# Patient Record
Sex: Male | Born: 1935 | Race: White | Hispanic: No | Smoking: Former smoker
Health system: Southern US, Community
[De-identification: ages and names within clinical notes are randomized; demographics above are authoritative.]

## PROBLEM LIST (undated history)

## (undated) DIAGNOSIS — Z87442 Personal history of urinary calculi: Secondary | ICD-10-CM

## (undated) DIAGNOSIS — E538 Deficiency of other specified B group vitamins: Secondary | ICD-10-CM

## (undated) DIAGNOSIS — K219 Gastro-esophageal reflux disease without esophagitis: Secondary | ICD-10-CM

## (undated) DIAGNOSIS — M1990 Unspecified osteoarthritis, unspecified site: Secondary | ICD-10-CM

## (undated) DIAGNOSIS — K635 Polyp of colon: Secondary | ICD-10-CM

## (undated) DIAGNOSIS — I10 Essential (primary) hypertension: Secondary | ICD-10-CM

## (undated) DIAGNOSIS — F0390 Unspecified dementia without behavioral disturbance: Secondary | ICD-10-CM

## (undated) DIAGNOSIS — N400 Enlarged prostate without lower urinary tract symptoms: Secondary | ICD-10-CM

## (undated) DIAGNOSIS — E785 Hyperlipidemia, unspecified: Secondary | ICD-10-CM

## (undated) DIAGNOSIS — N390 Urinary tract infection, site not specified: Secondary | ICD-10-CM

## (undated) HISTORY — DX: Essential (primary) hypertension: I10

## (undated) HISTORY — PX: APPENDECTOMY: SHX54

## (undated) HISTORY — DX: Deficiency of other specified B group vitamins: E53.8

## (undated) HISTORY — DX: Benign prostatic hyperplasia without lower urinary tract symptoms: N40.0

## (undated) HISTORY — DX: Hyperlipidemia, unspecified: E78.5

## (undated) HISTORY — DX: Unspecified osteoarthritis, unspecified site: M19.90

## (undated) HISTORY — PX: KNEE CARTILAGE SURGERY: SHX688

## (undated) HISTORY — DX: Polyp of colon: K63.5

## (undated) HISTORY — PX: OTHER SURGICAL HISTORY: SHX169

## (undated) HISTORY — DX: Personal history of urinary calculi: Z87.442

## (undated) HISTORY — DX: Gastro-esophageal reflux disease without esophagitis: K21.9

---

## 1997-03-23 ENCOUNTER — Encounter: Payer: Self-pay | Admitting: Gastroenterology

## 1997-03-24 ENCOUNTER — Encounter: Payer: Self-pay | Admitting: Gastroenterology

## 2002-06-29 ENCOUNTER — Encounter: Admission: RE | Admit: 2002-06-29 | Discharge: 2002-06-29 | Payer: Self-pay | Admitting: Family Medicine

## 2002-06-29 ENCOUNTER — Encounter: Payer: Self-pay | Admitting: Family Medicine

## 2002-09-15 ENCOUNTER — Encounter: Payer: Self-pay | Admitting: Gastroenterology

## 2002-09-30 ENCOUNTER — Encounter: Payer: Self-pay | Admitting: *Deleted

## 2002-09-30 ENCOUNTER — Emergency Department (HOSPITAL_COMMUNITY): Admission: EM | Admit: 2002-09-30 | Discharge: 2002-09-30 | Payer: Self-pay | Admitting: Emergency Medicine

## 2002-10-02 ENCOUNTER — Emergency Department (HOSPITAL_COMMUNITY): Admission: EM | Admit: 2002-10-02 | Discharge: 2002-10-02 | Payer: Self-pay | Admitting: Emergency Medicine

## 2002-10-02 ENCOUNTER — Ambulatory Visit (HOSPITAL_COMMUNITY): Admission: EM | Admit: 2002-10-02 | Discharge: 2002-10-02 | Payer: Self-pay | Admitting: Emergency Medicine

## 2002-10-02 ENCOUNTER — Encounter: Payer: Self-pay | Admitting: Urology

## 2002-10-06 ENCOUNTER — Encounter: Payer: Self-pay | Admitting: Urology

## 2002-10-06 ENCOUNTER — Ambulatory Visit (HOSPITAL_BASED_OUTPATIENT_CLINIC_OR_DEPARTMENT_OTHER): Admission: RE | Admit: 2002-10-06 | Discharge: 2002-10-06 | Payer: Self-pay | Admitting: Urology

## 2005-12-11 ENCOUNTER — Ambulatory Visit: Payer: Self-pay | Admitting: Gastroenterology

## 2005-12-17 ENCOUNTER — Encounter (INDEPENDENT_AMBULATORY_CARE_PROVIDER_SITE_OTHER): Payer: Self-pay | Admitting: *Deleted

## 2005-12-17 ENCOUNTER — Ambulatory Visit: Payer: Self-pay | Admitting: Gastroenterology

## 2006-02-17 ENCOUNTER — Ambulatory Visit: Payer: Self-pay | Admitting: Gastroenterology

## 2006-05-08 ENCOUNTER — Inpatient Hospital Stay (HOSPITAL_COMMUNITY): Admission: EM | Admit: 2006-05-08 | Discharge: 2006-05-14 | Payer: Self-pay | Admitting: Emergency Medicine

## 2006-05-18 ENCOUNTER — Ambulatory Visit: Payer: Self-pay | Admitting: Internal Medicine

## 2008-11-20 ENCOUNTER — Ambulatory Visit: Admission: RE | Admit: 2008-11-20 | Discharge: 2008-11-20 | Payer: Self-pay | Admitting: Orthopedic Surgery

## 2008-11-23 ENCOUNTER — Emergency Department (HOSPITAL_COMMUNITY): Admission: EM | Admit: 2008-11-23 | Discharge: 2008-11-23 | Payer: Self-pay | Admitting: Emergency Medicine

## 2009-01-30 ENCOUNTER — Inpatient Hospital Stay (HOSPITAL_COMMUNITY): Admission: RE | Admit: 2009-01-30 | Discharge: 2009-02-03 | Payer: Self-pay | Admitting: Orthopedic Surgery

## 2009-03-01 ENCOUNTER — Encounter: Payer: Self-pay | Admitting: Gastroenterology

## 2009-03-26 ENCOUNTER — Ambulatory Visit: Payer: Self-pay | Admitting: Gastroenterology

## 2009-03-26 DIAGNOSIS — R195 Other fecal abnormalities: Secondary | ICD-10-CM

## 2009-03-26 DIAGNOSIS — D649 Anemia, unspecified: Secondary | ICD-10-CM | POA: Insufficient documentation

## 2009-03-26 DIAGNOSIS — Z860100 Personal history of colon polyps, unspecified: Secondary | ICD-10-CM | POA: Insufficient documentation

## 2009-03-28 ENCOUNTER — Encounter: Payer: Self-pay | Admitting: Gastroenterology

## 2009-03-28 ENCOUNTER — Ambulatory Visit: Payer: Self-pay | Admitting: Gastroenterology

## 2009-03-28 LAB — HM COLONOSCOPY

## 2009-03-30 ENCOUNTER — Encounter: Payer: Self-pay | Admitting: Gastroenterology

## 2010-10-21 ENCOUNTER — Encounter: Payer: Self-pay | Admitting: Family Medicine

## 2010-10-21 DIAGNOSIS — N400 Enlarged prostate without lower urinary tract symptoms: Secondary | ICD-10-CM | POA: Insufficient documentation

## 2010-10-21 DIAGNOSIS — J310 Chronic rhinitis: Secondary | ICD-10-CM | POA: Insufficient documentation

## 2010-10-21 DIAGNOSIS — E119 Type 2 diabetes mellitus without complications: Secondary | ICD-10-CM | POA: Insufficient documentation

## 2010-10-21 DIAGNOSIS — Z87442 Personal history of urinary calculi: Secondary | ICD-10-CM | POA: Insufficient documentation

## 2010-11-01 LAB — GLUCOSE, CAPILLARY: Glucose-Capillary: 149 mg/dL — ABNORMAL HIGH (ref 70–99)

## 2010-11-03 LAB — BASIC METABOLIC PANEL
BUN: 14 mg/dL (ref 6–23)
CO2: 29 mEq/L (ref 19–32)
CO2: 30 mEq/L (ref 19–32)
Calcium: 8.2 mg/dL — ABNORMAL LOW (ref 8.4–10.5)
Chloride: 101 mEq/L (ref 96–112)
Creatinine, Ser: 0.85 mg/dL (ref 0.4–1.5)
GFR calc Af Amer: >60 mL/min (ref >60)
GFR calc non Af Amer: >60 mL/min (ref >60)
Sodium: 135 mEq/L (ref 135–145)

## 2010-11-03 LAB — GLUCOSE, CAPILLARY
Glucose-Capillary: 129 mg/dL — ABNORMAL HIGH (ref 70–99)
Glucose-Capillary: 141 mg/dL — ABNORMAL HIGH (ref 70–99)
Glucose-Capillary: 145 mg/dL — ABNORMAL HIGH (ref 70–99)
Glucose-Capillary: 167 mg/dL — ABNORMAL HIGH (ref 70–99)
Glucose-Capillary: 180 mg/dL — ABNORMAL HIGH (ref 70–99)
Glucose-Capillary: 184 mg/dL — ABNORMAL HIGH (ref 70–99)
Glucose-Capillary: 184 mg/dL — ABNORMAL HIGH (ref 70–99)
Glucose-Capillary: 189 mg/dL — ABNORMAL HIGH (ref 70–99)
Glucose-Capillary: 228 mg/dL — ABNORMAL HIGH (ref 70–99)
Glucose-Capillary: 277 mg/dL — ABNORMAL HIGH (ref 70–99)

## 2010-11-03 LAB — CBC
Hemoglobin: 8.6 g/dL — ABNORMAL LOW (ref 13.0–17.0)
MCHC: 33.5 g/dL (ref 30.0–36.0)
MCHC: 34.1 g/dL (ref 30.0–36.0)
MCV: 98.5 fL (ref 78.0–100.0)
Platelets: 124 10*3/uL — ABNORMAL LOW (ref 150–400)
RBC: 2.56 MIL/uL — ABNORMAL LOW (ref 4.22–5.81)
RBC: 2.86 MIL/uL — ABNORMAL LOW (ref 4.22–5.81)
WBC: 8.4 10*3/uL (ref 4.0–10.5)

## 2010-11-03 LAB — TYPE AND SCREEN
ABO/RH(D): O POS
Antibody Screen: NEGATIVE

## 2010-11-04 LAB — URINALYSIS, ROUTINE W REFLEX MICROSCOPIC
Bilirubin Urine: NEGATIVE
Glucose, UA: NEGATIVE mg/dL
Ketones, ur: NEGATIVE mg/dL
Protein, ur: NEGATIVE mg/dL
pH: 5 (ref 5.0–8.0)

## 2010-11-04 LAB — BASIC METABOLIC PANEL
BUN: 28 mg/dL — ABNORMAL HIGH (ref 6–23)
CO2: 28 mEq/L (ref 19–32)
Calcium: 9.4 mg/dL (ref 8.4–10.5)
GFR calc non Af Amer: >60 mL/min (ref >60)
Glucose, Bld: 169 mg/dL — ABNORMAL HIGH (ref 70–99)

## 2010-11-04 LAB — DIFFERENTIAL
Basophils Absolute: 0 10*3/uL (ref 0.0–0.1)
Basophils Relative: 0 % (ref 0–1)
Eosinophils Relative: 2 % (ref 0–5)
Lymphocytes Relative: 14 % (ref 12–46)
Neutro Abs: 4.8 10*3/uL (ref 1.7–7.7)

## 2010-11-04 LAB — CBC
MCHC: 33.9 g/dL (ref 30.0–36.0)
Platelets: 143 10*3/uL — ABNORMAL LOW (ref 150–400)
RDW: 13.7 % (ref 11.5–15.5)

## 2010-11-04 LAB — APTT: aPTT: 30 seconds (ref 24–37)

## 2010-11-04 LAB — PROTIME-INR: Prothrombin Time: 13.4 seconds (ref 11.6–15.2)

## 2010-11-06 LAB — URINALYSIS, ROUTINE W REFLEX MICROSCOPIC
Bilirubin Urine: NEGATIVE
Glucose, UA: NEGATIVE mg/dL
Glucose, UA: NEGATIVE mg/dL
Hgb urine dipstick: NEGATIVE
Ketones, ur: 15 mg/dL — AB
Ketones, ur: NEGATIVE mg/dL
Protein, ur: >300 mg/dL — AB
Protein, ur: NEGATIVE mg/dL
Urobilinogen, UA: 0.2 mg/dL (ref 0.0–1.0)
pH: 5.5 (ref 5.0–8.0)

## 2010-11-06 LAB — URINE CULTURE: Colony Count: 50000

## 2010-11-06 LAB — CBC
HCT: 31.9 % — ABNORMAL LOW (ref 39.0–52.0)
HCT: 33.1 % — ABNORMAL LOW (ref 39.0–52.0)
Hemoglobin: 10.9 g/dL — ABNORMAL LOW (ref 13.0–17.0)
MCHC: 34.2 g/dL (ref 30.0–36.0)
MCHC: 34.4 g/dL (ref 30.0–36.0)
MCV: 98.2 fL (ref 78.0–100.0)
Platelets: 177 10*3/uL (ref 150–400)
RBC: 3.25 MIL/uL — ABNORMAL LOW (ref 4.22–5.81)
RDW: 14.2 % (ref 11.5–15.5)
RDW: 14.6 % (ref 11.5–15.5)
WBC: 7.3 10*3/uL (ref 4.0–10.5)

## 2010-11-06 LAB — BASIC METABOLIC PANEL
BUN: 25 mg/dL — ABNORMAL HIGH (ref 6–23)
CO2: 30 mEq/L (ref 19–32)
Calcium: 9 mg/dL (ref 8.4–10.5)
Chloride: 104 mEq/L (ref 96–112)
Creatinine, Ser: 1.09 mg/dL (ref 0.4–1.5)
GFR calc Af Amer: >60 mL/min (ref >60)
Glucose, Bld: 185 mg/dL — ABNORMAL HIGH (ref 70–99)
Glucose, Bld: 193 mg/dL — ABNORMAL HIGH (ref 70–99)
Potassium: 4.2 mEq/L (ref 3.5–5.1)
Potassium: 4.4 mEq/L (ref 3.5–5.1)
Sodium: 137 mEq/L (ref 135–145)

## 2010-11-06 LAB — DIFFERENTIAL
Basophils Absolute: 0 10*3/uL (ref 0.0–0.1)
Basophils Relative: 0 % (ref 0–1)
Basophils Relative: 1 % (ref 0–1)
Eosinophils Absolute: 0.1 10*3/uL (ref 0.0–0.7)
Eosinophils Relative: 0 % (ref 0–5)
Eosinophils Relative: 2 % (ref 0–5)
Lymphocytes Relative: 5 % — ABNORMAL LOW (ref 12–46)
Lymphs Abs: 1 10*3/uL (ref 0.7–4.0)
Monocytes Absolute: 0.6 10*3/uL (ref 0.1–1.0)
Monocytes Relative: 4 % (ref 3–12)
Neutro Abs: 12.1 10*3/uL — ABNORMAL HIGH (ref 1.7–7.7)
Neutrophils Relative %: 77 % (ref 43–77)

## 2010-11-06 LAB — PROTIME-INR
INR: 1 (ref 0.00–1.49)
Prothrombin Time: 13.6 seconds (ref 11.6–15.2)

## 2010-12-10 NOTE — H&P (Signed)
[name], [name]             ACCOUNT NO.:  [id number]   MEDICAL RECORD NO.:  [id number]           PATIENT TYPE:   LOCATION:                                 FACILITY:   PHYSICIAN:  Madlyn Frankel. Charlann Boxer, M.D.  DATE OF BIRTH:  September 01, 1928   DATE OF ADMISSION:  01/30/2009  DATE OF DISCHARGE:                              HISTORY & PHYSICAL   ATTENDING PHYSICIAN:  Madlyn Frankel. Charlann Boxer, M.D.   PROCEDURE:  Will be right total hip placement.   REASON FOR ADMISSION:  Right hip pain with right hip osteoarthritis.   HISTORY OF PRESENT ILLNESS:  A 77 year old male with a history of right  hip pain secondary to osteoarthritis.  It has been refractory to all  conservative treatment.   PRIMARY CARE PHYSICIAN:  Dr. Gilmore Laroche.   OTHER SPECIALISTS:  Include Dr. Lucianne Muss, Dr. Tresa Endo, Dr. Luciana Axe, Dr. Russella Dar  and Dr. Aldean Ast.   PAST MEDICAL HISTORY:  1. Osteoarthritis.  2. Hypertension.  3. Hypercholesterolemia.  4. Reflux disease.  5. Diabetes.   PAST SURGICAL HISTORY:  1. Tonsillectomy.  2. Appendectomy.  3. Crush injuries to fingers.  4. Two toes cut off at work.  5. __________ in 2007.  6. Arthroscopic surgery right knee in 1996.   FAMILY HISTORY:  Lung disease, coronary artery disease.   SOCIAL HISTORY:  Married, retired, nonsmoker.  Lives at home with his  wife.   DRUG ALLERGIES:  MORPHINE, ADVIL, IBUPROFEN.   MEDICATIONS:  1. Glimepiride 4 mg one p.o. b.i.d.  2. Atenolol 25 mg one p.o. daily.  3. Avandia 4 mg one p.o. b.i.d.  4. HCTZ 25 mg 1/2 tablet p.o. daily.  5. Fexofenadine 180 mg p.o. daily.  6. Mavik 1 mg p.o. daily.  7. Metformin 500 mg one p.o. q.a.m. and one p.o. q.p.m.  8. Omeprazole 20 mg one p.o. daily.  9. Simvastatin 80 mg 1/2 tablet p.o. daily.  10.Aspirin 81 mg p.o. daily.  11.NovoLog insulin 3-4 units before meals.  12.Levemir 4 units before supper.  13.Vitamin D 2000 units daily.   REVIEW OF SYSTEMS:  GENITOURINARY:  He recently was treated for  prostatitis with Cipro, symptoms have resolved and is cleared for  surgery.  Otherwise, see HPI.   PHYSICAL EXAMINATION:  VITAL SIGNS:  Pulse 64, respirations 16, blood  pressure was 106/54.  GENERAL:  Awake, alert and oriented.  HEENT:  Normocephalic.  NECK:  Supple.  No carotid bruits.  CHEST:  Lungs clear to auscultation bilaterally.  BREASTS:  Deferred.  HEART:  S1-S2 distinct.  ABDOMEN:  Soft, bowel sounds present.  PELVIS:  Stable.  GENITOURINARY:  Deferred.  EXTREMITIES:  Right hip has increased pain with range of motion and  weightbearing.  SKIN:  No cellulitis.  NEUROLOGIC:  Intact distal sensibilities.   LABORATORY DATA:  Labs, EKG, chest x-ray all pending presurgical  testing.   IMPRESSION:  Right hip osteoarthritis.   PLAN OF ACTION:  Right total hip replacement by Dr. Charlann Boxer at [facility] on January 30, 2009.  The risks and complications were discussed.   Postop medications were provided including  aspirin for DVT prophylaxis.     ______________________________  Yetta Glassman [address], [place]      Madlyn Frankel. Charlann Boxer, M.D.  Electronically Signed    BLM/MEDQ  D:  01/17/2009  T:  01/17/2009  Job:  [id number]   cc:   Broadus John T. Pamalee Leyden, MD   Dr Tresa Endo   Dr Luciana Axe   Dr Lucianne Muss

## 2010-12-10 NOTE — Op Note (Signed)
[name], [name]             ACCOUNT NO.:  [id number]   MEDICAL RECORD NO.:  [id number]          PATIENT TYPE:  INP   LOCATION:  0003                         FACILITY:  [facility]   PHYSICIAN:  Madlyn Frankel. Charlann Boxer, M.D.  DATE OF BIRTH:  21-Feb-1929   DATE OF PROCEDURE:  01/30/2009  DATE OF DISCHARGE:                               OPERATIVE REPORT   PREOPERATIVE DIAGNOSIS:  Right hip osteoarthritis.   POSTOPERATIVE DIAGNOSIS:  Right hip osteoarthritis.   PROCEDURE:  Right total hip replacement.   COMPONENTS USED:  DePuy hip system, size 54 pinnacle cup, two cancellous  bone screws, 36 +4 neutral marathon liner, a 10 high trial lock stem,  and a 36 +1.5 ball.   ASSISTANT:  Yetta Glassman. Marcellus Scott.   ANESTHESIA:  General.   SPECIMENS:  None.   FINDINGS:  None.   BLOOD LOSS:  200 mL.   DRAINS:  One.   COMPLICATIONS:  None.   INDICATIONS FOR PROCEDURE:  [name] is a 77 year old gentleman who  presented to the office for right hip pain.  Radiographs revealed end-  stage bone-on-bone changes.  Conservative measures were failing to  provide adequate relief and his overall desire for functional activity.  He wished to discuss surgical options.  Risks and benefits were  discussed and reviewed.  Consent was obtained for right total hip  replacement.  The risks of infection, DVT, component failure,  dislocation, and need for revision were all reviewed and discussed.  Consent obtained.   PROCEDURE IN DETAIL:  The patient was brought to the operative theater.  Once adequate anesthesia and preoperative antibiotics, Ancef, were  administered, the patient was positioned in the left lateral decubitus  position with the right side up.  Evaluation of his extremities in  relation to one another was done to evaluate for postoperative leg  length and trialing.   The right lower extremity was then pre-scrubbed, prepped, and draped in  a sterile fashion.  A timeout was performed,  identifying the patient,  planned procedure, and extremity.   The lateral-based incision was made for posterior approach to the hip.  The short external rotators were identified and taken down inferior to  the piriformis attachment as a single layer with the capsule.  The hip  was dislocated and the posterior capsular tissue was preserved for later  repair as well as to the protect the sciatic nerve from retractors.   The hip was dislocated and neck osteotomy was made based off anatomic  landmarks and particularly using a trial neck with the center head to  identify the correct height of the neck cut.   I began on the femoral side using a box osteotome to remove some of the  remaining lateral neck bone.  I then used a starting drill, then hand  reamed once, and then irrigated to prevent fat emboli.  I then began to  broach, set in anteversion at 20-25 degrees on the femur, and then  broached all the way up to initially size 8.  I packed off the femur and  attended to the acetabulum.  Following acetabular  retractor placement, I  debrided labrum.  I began reaming with 47 reamer and reamed up to a 53  reamer and good bony bed preparation.  The final 54 pinnacle cup was  then impacted.  Cup position appeared to be anatomic in relation to the  transverse acetabular ligament with a portion of the cup exposed  superolateral and then beneath the rim anteriorly.   The two cancellous bone screws were placed.  Based on the cup position,  I went ahead and placed a 36 +4 neutral liner.   Attention was redirected back to femur.  I retrialed with an 8 broach  and then ended up broaching up to size 10 which sat just a little bit  underneath the neck cut.  I used a calcar planer and removed some of the  bone in the anterior aspect.  Trial reduction was carried out with a  high offset neck and a 36 +1.5 ball.  Leg lengths appeared to be  comparable to the down leg relationship to the anterior aspect  of the  knee in addition to his heels.  Given the stability the hip, the range  of motion, and the leg lengths, I went and a chose a 36 +1.5 ball.  This  final ball was opened and impacted onto dry trunnion.  The hip was  reduced.  We irrigated the hip throughout the case and again at this  point.  I reapproximated the posterior capsule to the superior leaflet  using 1-Vicryl.  A medium Hemovac drain was placed deep.  The gluteal  fascia and iliotibial band were then reapproximated over top of the  drain with 1-Vicryl, 2-0 Vicryl and skin and running 4-0 Monocryl.  The  hip was cleaned, dried, and dressed sterilely with Steri-Strips and a  sterile Mepilex dressing.  He was brought to the recovery room and  extubated in stable condition.      Madlyn Frankel Charlann Boxer, M.D.  Electronically Signed     MDO/MEDQ  D:  01/30/2009  T:  01/30/2009  Job:  [id number]

## 2010-12-10 NOTE — H&P (Signed)
[name], [name]             ACCOUNT NO.:  [id number]   MEDICAL RECORD NO.:  [id number]          PATIENT TYPE:  INP   LOCATION:  NA                           FACILITY:  [facility]   PHYSICIAN:  Madlyn Frankel. Charlann Boxer, M.D.  DATE OF BIRTH:  1928-10-19   DATE OF ADMISSION:  DATE OF DISCHARGE:                              HISTORY & PHYSICAL   PROCEDURE:  Right total hip replacement.   CHIEF COMPLAINTS:  Right hip pain.   HISTORY OF PRESENT ILLNESS:  A 77 year old male with a history of right  hip pain secondary to osteoarthritis.  It has been refractory to all  conservative treatment.  Been presurgically assessed by primary care  physician, Dr. Marline Backbone.   PRIMARY CARE PHYSICIAN:  Dr. Marline Backbone.   OTHER SPECIALISTS:  Include Dr. Lucianne Muss, Dr. Tresa Endo, Dr. Luciana Axe, Dr. Ailene Ravel  and Dr. Dr. Aldean Ast.   MEDICAL HISTORY:  1. Osteoarthritis.  2. Hypertension.  3. Hypercholesterolemia.  4. Reflux disease.  5. Diabetes.   PAST SURGERIES:  1. Tonsillectomy.  2. Appendectomy.  3. Crush injuries to fingers.  4. Two toes cut off at work.  5. Blocked bowel and intestine 2007.  6. Arthroscopic surgery right knee 1996.   FAMILY HISTORY:  Lung disease, coronary artery disease.   SOCIAL HISTORY:  Married, retired, nonsmoker.  Lives at home with wife.   DRUG ALLERGIES:  1. MORPHINE.  2. ADVIL.  3. IBUPROFEN.   MEDICATIONS:  1. Glimepiride 4 mg one p.o. b.i.d.  2. Atenolol 25 mg one p.o. daily.  3. Avandia 4 mg one p.o. b.i.d.  4. HCTZ 25 mg 1/2 tablet p.o. daily.  5. Fexofenadine 180 mg p.o. daily.  6. Mavik 1 mg one p.o. daily.  7. Metformin 500 mg one p.o. q.a.m. and one p.o. q.p.m.  8. Omeprazole 20 mg one p.o. daily.  9. Simvastatin 80 mg 1/2 tablet p.o. daily.  10.Aspirin 81 mg one p.o. daily.  11.NovoLog insulin 3-4 units before meals.   REVIEW OF SYSTEMS:  CARDIOVASCULAR:  Last EKG was September 20, 2008 at  [facility].  GENITOURINARY: Has urinary  frequency,  weak stream urinating..  MUSCULOSKELETAL:  Has joint pain.  Otherwise  see HPI.   PHYSICAL EXAMINATION:  VITAL SIGNS:  Pulse 72, respirations 16, blood  pressure 120/72.  GENERAL:  Awake, alert and oriented.  HEENT: Normocephalic.  NECK:  Supple.  No carotid bruits.  CHEST/LUNGS:  Clear to auscultation bilaterally.  BREASTS:  Deferred.  HEART: S1-S2 distinct.  ABDOMEN:  Soft, nontender, bowel sounds present.  PELVIS:  Stable.  GENITOURINARY:  Deferred.  EXTREMITIES:  Right hip has increased pain, decreased range of motion.  SKIN:  No cellulitis.  NEUROLOGIC:  Intact distal sensibilities.   LABORATORY DATA:  Labs, EKG, chest x-ray pending presurgical testing.   IMPRESSION:  Right hip osteoarthritis.   PLAN OF ACTION:  Right total hip replacement [facility] by Dr.  Charlann Boxer on Nov 27, 2008.  Risks, complications were discussed.   Postop medications were provided including aspirin for DVT prophylaxis.     ______________________________  Yetta Glassman [address], [place]  Madlyn Frankel Charlann Boxer, M.D.  Electronically Signed    BLM/MEDQ  D:  11/15/2008  T:  11/15/2008  Job:  [id number]   cc:   Dr. Cristy Friedlander, M.D.  Fax: [phone]   Dr. Gilmore Laroche   Dr. Hortense Ramal T. Russella Dar, MD, FACG  [address]  [place]  [place] [address]   Courtney Paris, M.D.  Fax: [phone]

## 2010-12-13 NOTE — H&P (Signed)
NAMENICODEMUS, DENK NO.:  [id number]   MEDICAL RECORD NO.:  [id number]          PATIENT TYPE:  EMS   LOCATION:  ED                           FACILITY:  [facility]   PHYSICIAN:  Hollice Espy, M.D.DATE OF BIRTH:  1928-10-10   DATE OF ADMISSION:  05/07/2006  DATE OF DISCHARGE:                                HISTORY & PHYSICAL   SURGICAL CONSULT:  Sandria Bales. Ezzard Standing, M.D.   PRIMARY CARE PHYSICIAN:  Film/video editor.   ENDOCRINOLOGIST:  Reather Littler, M.D.   CHIEF COMPLAINT:  Abdominal pain.   HISTORY OF PRESENT ILLNESS:  The patient is a 77 year old white male with  past medical history of hypertension, hyperlipidemia, and diabetes mellitus  who started having problems with abdominal pain initially in the left upper  quadrant.  There was a concern, initially that he had diverticulitis and was  treated as such and put on Levaquin and Flagyl, but then he started getting  nauseated and had vomiting and was brought to the [facility] by  ambulance.  There, a CT scan showed a change in his small bowel in the left  upper quadrant showing questionable ischemic bowel versus a gastroenteritis.  The patient then had a surgical evaluation who felt that likely this could  be ischemic bowel, but it could be managed conservatively with IV fluids,  bowel rest, antibiotics and pain medication.  The plan is to conservatively  watch the patient.  Dr. Ezzard Standing discussed the patient with me and we have  agreed to admit the patient from a medicine standpoint.  Currently patient,  after receiving some pain medication is feeling better.  He denies any  headaches, vision changes, dysphagia, chest pain, palpitations, no shortness  of breath, no wheeze, cough.  Currently is not having any abdominal pain.  No hematuria, dysuria, constipation, no diarrhea, no focal ext, numbness,  weakness, or pain.   REVIEW OF SYSTEMS:  Otherwise negative.   PAST MEDICAL HISTORY:   Diabetes, hypertension, and hyperlipidemia.   MEDICATIONS:  He is on Amaryl, aspirin, atenolol, Avandia, HCTZ, Mavik,  loratadine, metformin, omeprazole, simvastatin, and Carafate.  He has  recently been put on Levaquin and Flagyl for what they thought initially was  C. difficile.   ALLERGIES:  MORPHINE.   SOCIAL HISTORY:  He denies any tobacco, alcohol, or drug use.  He quit  smoking a pipe 10 years ago.   FAMILY HISTORY:  Noncontributory.   PHYSICAL EXAMINATION:  VITAL SIGNS:  The patient's vitals on admission,  temperature 97.4, heart rate 55, blood pressure 145/71, respirations 18, O2  saturation 100% on room air.  GENERAL:  The patient is alert and oriented x3.  No apparent distress.  HEENT:  Normocephalic, atraumatic.  Mucous membranes are moist.  He has no  carotid bruits.  HEART:  Regular rate and rhythm.  A 3/6 systolic ejection murmur.  LUNGS:  Clear to auscultation bilaterally.  ABDOMEN:  Soft, nontender, nondistended.  Decreased bowel sounds.  EXTREMITIES:  No clubbing, cyanosis, or edema.   LAB WORK:  White count 11, H&H 13.2/38.6, MCV 98, platelet count 206,  shift  89% .  Sodium 139, potassium 3.7, chloride 101, bicarb 25, BUN 25,  creatinine 1.1, glucose 285.  LFTs are all unremarkable.  UA shows ketones  15, protein 30, greater than 1000 of glucose.  only  2 white and red cells.   ASSESSMENT AND PLAN:  1. Questionable ischemic bowel.  Will plan to admit the patient, make him      NPO.  Put him on a bowel rest with IV hydration, and medication for      pain control, as well as IV Cipro and Flagyl for antibiotic coverage,      and IV hydrate.  Will recheck his labs in the morning.  I have      discussed this with Dr. Ezzard Standing.  If his white count is elevated, then      they will rechecking him for a CT scan versus possibly taking him to      the OR.  2. Diabetes mellitus, for now since he is NPO we will put him on a q.4 h.      sensitive sliding scale.  3. Mild  renal insufficiency secondary to dehydration, will hydrate.      Hollice Espy, M.D.  Electronically Signed     SKK/MEDQ  D:  05/08/2006  T:  05/08/2006  Job:  [id number]   cc:   Sandria Bales. Ezzard Standing, M.D.  [address]  [place]  [place] [address]   Reather Littler, M.D.  Fax: [phone]

## 2010-12-13 NOTE — Op Note (Signed)
   NAME:  [name], [name]                       ACCOUNT NO.:  [id number]   MEDICAL RECORD NO.:  [id number]                   PATIENT TYPE:  EMS   LOCATION:  ED                                   FACILITY:  [facility]   PHYSICIAN:  Claudette Laws, M.D.               DATE OF BIRTH:  1928/07/30   DATE OF PROCEDURE:  10/02/2002  DATE OF DISCHARGE:                                 OPERATIVE REPORT   PREOPERATIVE DIAGNOSIS:  A 7-8 mm obstructing proximal right ureteral  calculus.   POSTOPERATIVE DIAGNOSIS:  A 7-8 mm obstructing proximal right ureteral  calculus.   OPERATION:  1. Cystoscopy.  2. Right retrograde pyelogram.  3. Insertion of a 6 French 26 cm double-J stent.   SURGEON:  Claudette Laws, M.D.   HISTORY:  This is a 77 year old gentleman, who presented two nights ago in  the [facility] ER with an obstructing stone in the proximal right ureter  with hydronephrosis, renal colic.  Over the next day or two, he has had  persistent pain with nausea and vomiting.  He presented in the ER this  morning.  I evaluated him and proposed that we put up a double-J stent.  He  understands and agrees to the proposed surgery.   DESCRIPTION OF PROCEDURE:  The patient was prepped and draped in the dorsal  lithotomy position under general anesthesia.  Cystoscopy was performed with  a well-lubricated 22 French cystoscope, had a normal anterior urethra and  some early BPH with kissing lateral lobes from 1 cm.  Some elevation of the  median lip.  The bladder itself looked normal and no tumors, no calculi, and  normal ureteral orifices.   Initially I passed up the .038 Glidewire through a 6 [place] open-ended  ureteral catheter, pulled out the Glidewire, obtained retrograde studies,  showing a mild right hydronephrosis.  We then reinserted the Glidewire using  fluoroscopic control, then backed out the open-ended catheter, and then put  up a 6 French 26 cm double-J stent without incident, and it was  positioned  in the upper pole calix.  The distal end was curled up in the bladder.  The  bladder was emptied.  All instruments were removed, and a B&O suppository  was placed per rectum.  The patient was then taken back to PACU in  satisfactory condition.                                               Claudette Laws, M.D.    RFS/MEDQ  D:  10/02/2002  T:  10/02/2002  Job:  [id number]

## 2010-12-13 NOTE — Assessment & Plan Note (Signed)
[facility]                           GASTROENTEROLOGY OFFICE NOTE   NAME:[name], [name]                    MRN:          [id number]  DATE:02/17/2006                            DOB:          Sep 17, 1928    [name] returns in followup for evaluation of anemia and heme-positive  stool.  His reflux symptoms are under excellent control on Prilosec.  He is  tolerating the iron replacement quite well but does note some mild looseness  to his stools along with slightly darker stools.  Erosive gastritis was  noted on upper endoscopy, but the biopsies obtained were unremarkable.  A  small adenomatous colon polyp was removed at colonoscopy.  Diverticulosis  was also noted.   CURRENT MEDICATIONS:  Medications listed on the chart are being reviewed.   ALLERGIES:  MORPHINE.   PHYSICAL EXAMINATION:  GENERAL:  In no acute distress.  VITAL SIGNS: Weight 182 pounds. Blood pressure 120/50, pulse 66 and regular.  CHEST:  Clear to auscultation bilaterally.  CARDIAC:  Regular rate and rhythm without murmur.  ABDOMEN:  Soft and nontender.  Normoactive bowel sounds.  No palpable  organomegaly, masses or hernias.   ASSESSMENT/PLAN:  1.  Anemia with heme-positive stool.  I suspect source of blood loss is      erosive gastritis.  In addition, he has chronic gastroesophageal reflux      disease symptoms.  Maintain omeprazole 20 mg p.o. q.a.m.  Minimize or      discontinue NSAID products.  He may continue on aspirin 81 mg daily as      indicated.  Obtain a CBC today and if his anemia is corrected we will      discontinue iron.  He will continue on B12 shots.  2.  Adenomatous colon polyp.  Recall colonoscopy in five years.                                   [name]. Pleas Koch., MD, Clementeen Graham   MTS/MedQ  DD:  02/17/2006  DT:  02/17/2006  Job #:  [id number]   cc:   Ernestina Penna, MD

## 2010-12-13 NOTE — H&P (Signed)
NAMEMAXIMOS, [name]             ACCOUNT NO.:  [id number]   MEDICAL RECORD NO.:  [id number]          PATIENT TYPE:  INP   LOCATION:  1609                         FACILITY:  [facility]   PHYSICIAN:  Madlyn Frankel. Charlann Boxer, M.D.  DATE OF BIRTH:  Jul 17, 1929   DATE OF ADMISSION:  01/30/2009  DATE OF DISCHARGE:  02/03/2009                              HISTORY & PHYSICAL   PROCEDURE:  Right total hip replacement.   SURGEON:  Madlyn Frankel. Charlann Boxer, M.D.   REASON FOR ADMISSION:  Right hip pain secondary to osteoarthritis.   HISTORY OF PRESENT ILLNESS:  A 77 year old male with a history of right  hip pain secondary to osteoarthritis refractory to all conservative  treatment.   CANCEL THIS DICTATION.     ______________________________  Yetta Glassman [address], [place]      Madlyn Frankel. Charlann Boxer, M.D.  Electronically Signed    BLM/MEDQ  D:  02/13/2009  T:  02/13/2009  Job:  [id number]

## 2010-12-13 NOTE — Op Note (Signed)
[name], [name]             ACCOUNT NO.:  [id number]   MEDICAL RECORD NO.:  [id number]          PATIENT TYPE:  INP   LOCATION:  1332                         FACILITY:  [facility]   PHYSICIAN:  Sandria Bales. Ezzard Standing, M.D.  DATE OF BIRTH:  04-Mar-1929   DATE OF PROCEDURE:  05/12/2006  DATE OF DISCHARGE:                                 OPERATIVE REPORT   PREOPERATIVE DIAGNOSIS:  Small bowel obstruction.   POSTOPERATIVE DIAGNOSIS:  Small bowel obstruction secondary to adhesive band  of appendices epiploica on the colon in the left upper quadrant.   OPERATION PERFORMED:  Laparoscopic lysis of adhesions.   SURGEON:  Sandria Bales. Ezzard Standing, M.D.   ASSISTANT:  None.   ANESTHESIA:  General endotracheal with about 20 mL of 0.5% Marcaine.   COMPLICATIONS:  None.   INDICATIONS FOR PROCEDURE:  [name] is a  77 year old white male who  was admitted to [facility] on May 08, 2006 with abdominal pain.  Evaluations revealed a probable small bowel obstruction though etiology is  unclear.  The patient now comes for a laparoscopic possible open laparotomy  to evaluate better what is going on.   I discussed with him and his family the indications and potential  complications of surgery.  Potential complications include but not limited  to bleeding, infection, it may be laparoscopic or may be open surgery, may  need a bowel resection, may need ostomy.   DESCRIPTION OF PROCEDURE:  Patient placed in a supine position, given a  general endotracheal anesthetic.  Had nasogastric tube placed, Foley  catheter in place.  He was already on Cipro and Flagyl as antibiotic.  His  right arm was tucked.  His left arm was out to the side.  I shaved and  prepped his abdomen with Betadine solution and sterilely draped his abdomen.   I made an infraumbilical incision with sharp dissection and carried it down  to the abdominal cavity.  A 0 degree 10 mm laparoscope was inserted through  a 12 mm Hasson  trocar.  The Hasson trocar secured with a 0 Vicryl suture.   I then placed two additional trocars of 5 mm in the right lower quadrant and  5 mm in the right upper quadrant.  I then irrigated his abdomen and  identified the decompressed distal small bowel, dilated proximal bowel.  I  started distally toward the terminal ileum and worked backwards.  At the  midway point in the small bowel, in the left upper quadrant, he had an  adhesive band trapping his small bowel.  The adhesive band seemed like an  appendices epiploaca that caused an internal hernia.  I divided this with a  Harmonic scalpel.  I then ran beyond this and saw no evidence of any damage  or compromised bowel nor any ischemic bowel.  I saw no evidence of tumor or  mass.   His liver, stomach, and the remaining bowel was unremarkable.   Again I irrigated the abdomen with about 500 mL of saline. I  ran the distal  small bowel again all looked normal.  The colon I  saw also looked normal.  There was no evidence of any tumor or mass or growth on that.  Both lobes of  the liver looked good.  His gallbladder looked good.  His stomach which was  mildly dilated, looked good and there was no other abnormality or mass.  I  think the surgery took care of the adhesive band.   I then removed the trocars without any bleeding at the trocar sites, the  umbilical port was closed with 0 Vicryl suture.  The skin at each site was  closed with 5-0 Monocryl, painted with tincture of benzoin and steri-  stripped.   The patient tolerated the procedure well, was transported to the recovery  room in good condition.  Sponge and needle counts were correct at the end of  this case.      Sandria Bales. Ezzard Standing, M.D.  Electronically Signed     DHN/MEDQ  D:  05/12/2006  T:  05/14/2006  Job:  [id number]   cc:   Corinna L. Lendell Caprice, MD   Iva Boop, MD,FACG  [facility]  [address] [place], [place] [address]   Reather Littler, M.D.  Fax:  [phone]   Franklyn Lor, MD  Fax: [phone]   Venita Lick. Russella Dar, MD, FACG  [address]  [place]  [place] [address]

## 2010-12-13 NOTE — Discharge Summary (Signed)
[name], [name]             ACCOUNT NO.:  [id number]   MEDICAL RECORD NO.:  [id number]          PATIENT TYPE:  INP   LOCATION:  1332                         FACILITY:  [facility]   PHYSICIAN:  Hollice Espy, M.D.DATE OF BIRTH:  1929-03-26   DATE OF ADMISSION:  05/07/2006  DATE OF DISCHARGE:  05/14/2006                                 DISCHARGE SUMMARY   PRIMARY CARE PHYSICIAN:  Dr. Rudi Heap of [facility].   ENDOCRINOLOGIST:  Dr. Reather Littler.   DISCHARGE DIAGNOSES:  1. Small bowel obstruction status post enterolysis of adhesions on May 12, 2006.  2. Diabetes mellitus.  3. Hypertension.  4. Hypercholesterolemia  5. Known diverticular disease.  6. A history of kidney stones.  7. Renal insufficiency secondary to dehydration, now resolved.  8. Incidental noted small right pleural effusion.   DISCHARGE MEDICATIONS FOR THIS PATIENT:  The patient will resume all of his  previous medications Amaryl, aspirin, atenolol, Avandia,  hydrochlorothiazide, iron, loratadine, Mavik, metformin, Protonix,  simvastatin.  We will stop Levaquin and Flagyl which was started right  before his recent hospitalization.   HOSPITAL COURSE:  The patient is a 77 year old white male who presented with  severe left upper quadrant pain on early morning of admission.  He was  evaluated at [facility] where his PCP resides and for evaluation of  possible diverticulitis given that he had a known history of diverticular  disease.  He was started on Levaquin and Flagyl.  However, he was started  having problems of worsening abdominal pain and had addition of nausea and  vomiting as well.  He was brought into the emergency room where he had a CT  scan done.  The CT scan showed some changes in the small bowel and left  upper quadrant with thickening and stool sign which seemed to be proximal.  This could be findings consistent with an either acute gastroenteritis or  ischemic bowel.  Given the  unclear etiology of this, it was felt better to  be admitted to Medicine.  Dr. Ezzard Standing saw the patient as a consult and the  patient was admitted to the medical service. He was there followed.  He was  initially continued on IV antibiotics, IV fluids, and made NPO.  Initially,  his diet was advanced.  However, he was unable to tolerate this and  continued to have episodes of vomiting and abdominal pain. A question of  whether this abdominal pain was indeed related to the small bowel  obstruction and Dr. Russella Dar of GI, whom patient had seen previously, was  reconsulted for evaluation.  He felt that likely this was continued  persistent ischemia, foreign body causing compression such as tumor, but he  doubted that this was gastroenteritis.  With these findings, he recommended  repeat CT.  He ordered a CT angiogram. This showed patency of the celiac  small and celiac superior and inferior mesenteric arteries and no clots, and  then ordered a small bowel followthrough was done.  A small bowel  followthrough did not show complete obstruction but it did show dilated  bowel on the left side.  With these findings, it was felt that likely he  would need laparotomy although this was definitely deferred to Dr. Ezzard Standing.  Dr. Ezzard Standing, after discussing with GI and evaluating the findings,  felt/agreed that the patient likely needed exploration and patient underwent  a laparoscopic enterolysis on May 12, 2006 with enterolysis of  adhesions.  The patient tolerated the procedure well and by October 17,  postoperative day one, he was doing well.  His NG tube was able to be  discontinued and he was started on clear liquids; he initially tolerated  this well, his diet was advanced, and by October 18 he was tolerating solid  food.  The  plan will be to discharge the patient to home.  His overall disposition is  improved.  His activity will be slow to increase.  His diet will be a  carbohydrate-modified diet and  he will follow up with Dr. Ezzard Standing in 2-3  weeks, Dr. Rudi Heap in the next 30 days, and follow up with Dr. Lucianne Muss in  a scheduled visit in the next 2-3 weeks.      Hollice Espy, M.D.  Electronically Signed     SKK/MEDQ  D:  05/14/2006  T:  05/16/2006  Job:  [id number]   cc:   Venita Lick. Russella Dar, MD, FACG  [address]  [place]  [place] [address]   Nicki Guadalajara, M.D.  Fax: [phone]   Sandria Bales. Ezzard Standing, M.D.  [address], [address]  [place]  [place] [address]   Ernestina Penna, M.D.  Fax: [phone]   Reather Littler, M.D.  Fax: [phone]

## 2010-12-13 NOTE — Consult Note (Signed)
[name], [name] NO.:  [id number]   MEDICAL RECORD NO.:  [id number]          PATIENT TYPE:  EMS   LOCATION:  ED                           FACILITY:  [facility]   PHYSICIAN:  Sandria Bales. Ezzard Standing, M.D.  DATE OF BIRTH:  [birth year]   DATE OF CONSULTATION:  05/07/2006  DATE OF DISCHARGE:                                   CONSULTATION   HISTORY OF PRESENT ILLNESS:  This is a 77 year old white male patient of Dr.  Rudi Heap of [facility] who developed severe left upper quadrant  abdominal pain at about 2:00 a.m. this morning.  He went to see Dr. Morrie Sheldon at  [facility] this morning.  It sounds like initially he was thought to have  possible diverticular disease and was given some Levaquin and metronidazole.  However, the patient vomited at home.  He never really started the  antibiotics and he was then taken by ambulance to the [facility] Emergency  Room this afternoon.   He denies any prior history of peptic ulcer disease, liver disease or  gallbladder disease.  He had an appendectomy as a ninth grade child.  He had  a colonoscopy in approximately 2003 by Dr. Claudette Head who found a polyp  and some diverticular disease but no other significant problem.  He had an  upper endoscopy which was negative.  Apparently this colonoscopy was done  because of some blood in his stool.   ALLERGIES:  MORPHINE.   CURRENT MEDICATIONS:  Amaryl, aspirin, atenolol, Avandia,  hydrochlorothiazide, iron, loratadine, Mavik, metformin, omeprazole,  simvastatin, Levaquin and metronidazole.   REVIEW OF SYSTEMS:  NEUROLOGIC:  No history of seizure or loss of  consciousness.  PULMONARY:  No pneumonia or tuberculosis.  CARDIAC:  He had a stress test by Dr. Tresa Endo about two years ago.  He does  have hypertension and is treated for that.  He has never had a heart  catheterization.  GASTROINTESTINAL:  See history of present illness.  UROLOGIC:  He has had kidney stones and has been seen by GI  doctor, Dr.  Dennison Nancy. Kimbrough, but released about three years ago.  He may have a  mildly large prostate but does pretty well voiding.  ENDOCRINE:  He has been diabetic for 35 years and has had  hypercholesterolemia for about three years.   SOCIAL HISTORY:  He has his wife and two sons in the room.   PHYSICAL EXAMINATION:  VITAL SIGNS:  Temperature 97, pulse 60, respirations  20.  GENERAL:  He is a well-nourished, older, white male who is alert and  cooperative to physical exam.  HEENT:  Unremarkable.  NECK:  Supple without mass or thyromegaly.  LUNGS:  Clear to auscultation with symmetric breath sounds.  HEART:  Regular rate and rhythm without murmur or rub.  I hear no carotid  bruit or abdominal bruit.  ABDOMEN:  Tenderness in the left upper quadrant but really minimal  tenderness.  He has no guarding.  He has no rebound.  He has decreased bowel  sounds.  He has no evidence of hernia or mass.  EXTREMITIES:  He has good strength in all four extremities.  NEUROLOGIC:  Grossly intact.  No motor or sensory dysfunction.   LABS:  White blood count 11,000, 89% neutrophils, hemoglobin 13, hematocrit  38.  Sodium 139, potassium 3.7, chloride 101, CO2 25, glucose 285, BUN 25,  creatinine 1.1, alkaline phosphatase 66, bilirubin 1.0.  Urinalysis is  negative.   I spoke to Dr. Kennith Center about his x-rays, primarily the CT scan, and  this shows changes in his small bowel and left upper quadrant with some  thickening of the small bowel with stool sign which seems to be proximal to  this thickened bowel, and he has very mild ascites.  These findings could  all be consistent with either acute gastroenteritis or ischemic bowel.   DIAGNOSES:  1. Abnormal small bowel left upper quadrant, possible ischemic bowel      versus enteritis versus other unknown cause.  I spoke to Dr. Virginia Rochester who is on call for [facility] hospitalist, and I think that he      would be best admitted by  internal medicine.  He needs IV hydration,      bowel rest, antibiotics, control of diabetes, repeat CBC and will need      followup clinically.  If he should get worse or be unchanged over three      or four days, may need repeat CT scan, but hopefully this will resolve      with medical care.  I will continue to follow the patient while he is      in the hospital.  2. Longstanding type II diabetes, poorly controlled right now.  3. Hypertension.  4. Hypercholesterolemia.  5. Known diverticular disease seen on colonoscopy.  6. Kidney stones, no problems now.  7. Possible prostate disease.      Sandria Bales. Ezzard Standing, M.D.  Electronically Signed     DHN/MEDQ  D:  05/07/2006  T:  05/08/2006  Job:  [id number]   cc:   Ernestina Penna, M.D.  Fax: [phone]   Franklyn Lor, MD  Fax: [phone]   Hollice Espy, M.D.   Reather Littler, M.D.  Fax: [phone]   Nicki Guadalajara, M.D.  Fax: [phone]   Venita Lick. Russella Dar, MD, FACG  [address]  [place]  [place] [address]

## 2010-12-13 NOTE — Consult Note (Signed)
[name], MONTOYA NO.:  [id number]   MEDICAL RECORD NO.:  [id number]                   PATIENT TYPE:   LOCATION:                                       FACILITY:   PHYSICIAN:  Claudette Laws, M.D.               DATE OF BIRTH:   DATE OF CONSULTATION:  DATE OF DISCHARGE:                                   CONSULTATION   CHIEF COMPLAINT:  Kidney stone pain.   HISTORY OF PRESENT ILLNESS:  This 77 year old gentleman diabetic was in the  [facility] ER two nights ago with a 7 mm proximal obstructing right  ureteral calculus with renal colic.  Over the next day or two he has had  persistent pain with nausea and vomiting but no fever or chills.  I saw him  this morning in the [facility] Emergency Room where he was quite  uncomfortable.  A KUB x-ray showed a large dense 7-8 mm stone in the  proximal to mid right ureter best seen on the oblique film.  After  discussing treatment options with him, I thought it would be best to take  him to the operating room and put up a double-J stent.  I explained the  procedure carefully to him and his family and they would like to proceed.  We discussed complications such as postop pain, fever, inability to bypass  the stone.   PAST MEDICAL HISTORY:   ALLERGIES:  MORPHINE.   MEDICATIONS:  Are multiple and include metformin 1000 mg every morning and 1-  1/2 tablets every afternoon, Amaryl 4 mg two per day, Mavik 1 mg a day,  atenolol 50 mg 1/2 tablet a day, Actos 30 mg 1/2 tablet a day, Zocor 40 mg a  day, and coated aspirin 81 mg a day.   PAST SURGICAL HISTORY:  He had an appendectomy in the past and two toes cut  off.   SOCIAL HISTORY:  No smoke or drink.  He is married.  He is here with his son  and wife in the emergency room.   PRIMARY CARE PHYSICIAN:  He sees Ernestina Penna, M.D. in [place] for  primary care.   ENDOCRINOLOGIST:  Reather Littler, M.D. for his diabetes.   PROCEDURES:  Two weeks  ago he had a colonoscopy per Judie Petit T. Russella Dar, M.D.   PHYSICAL EXAMINATION:  GENERAL:  He appears in mild distress.  VITAL SIGNS:  Blood pressure 141/62, pulse 70 and regular, respirations 18,  temperature 97.9.  HEENT:  Unremarkable.  CHEST:  Clear to auscultation and percussion.  HEART:  Regular rhythm, no murmurs.  ABDOMEN:  Benign.  Some mild right CVA tenderness.  No obvious hernias.  An  appendectomy scar.  GENITALIA:  Normal circumcised male, normal penis and meatus.  No lesions.  Scrotum is normal in appearance.  Testicles are bilaterally symmetrical and  no masses.  RECTAL:  A +1  to +2 flat benign smooth gland.  No nodules.  No other anal or  rectal lesions.  Good sphincter tone.   IMPRESSION:  1. A 7-8 mm obstructing proximal right ureteral stone with renal colic,     nausea and vomiting.  2. Diabetes mellitus.   PLAN:  Cystoscopy with insertion of a double-J ureteral stent to the right  kidney.                                               Claudette Laws, M.D.    RFS/MEDQ  D:  10/02/2002  T:  10/02/2002  Job:  [id number]

## 2010-12-13 NOTE — Discharge Summary (Signed)
[name], [name]             ACCOUNT NO.:  [id number]   MEDICAL RECORD NO.:  [id number]          PATIENT TYPE:  INP   LOCATION:  1609                         FACILITY:  [facility]   PHYSICIAN:  Madlyn Frankel. Charlann Boxer, M.D.  DATE OF BIRTH:  [birth year]   DATE OF ADMISSION:  01/30/2009  DATE OF DISCHARGE:  02/03/2009                               DISCHARGE SUMMARY   ADMITTING DIAGNOSES:  1. Osteoarthritis.  2. Hypertension.  3. Hypercholesterolemia.  4. Diabetes.  5. Reflux disease.   DISCHARGE DIAGNOSES:  1. Osteoarthritis.  2. Hypertension.  3. Hypercholesterolemia.  4. Diabetes.  5. Reflux disease.  6. Acute blood loss anemia, treated with iron.   HISTORY OF PRESENT ILLNESS:  This is a 77 year old male with a history  of right hip pain secondary to osteoarthritis.  Refractory to all  conservative treatment.   CONSULTS:  None.   PROCEDURE:  A right total hip replacement by surgeon, Dr. Durene Romans.  Assistant, Dwyane Luo P.A.-C.   LABORATORY DATA:  CBC, final reading white blood cell of 8.4, hemoglobin  8.6, hematocrit 25.1, platelets 114.  Metabolic, sodium 161, potassium  14, BUN 17, creatinine 0.84, glucose 148.   HOSPITAL COURSE:  Patient underwent a right total hip replacement,  admitted to orthopedic floor.  His stay was unremarkable.  He remained  orthopedically and hemodynamically stable throughout his course of stay.  Dressing was changed on day 2.  No significant drainage from the wound.  He was neurovascularly intact throughout his course.  He had improving  ability to ambulate with use of a rolling walker.  By day 3, he had met  all criteria for discharge home.   DISCHARGE DISPOSITION:  Discharged home in stable and improved  condition.   DISCHARGE DIET:  Heart healthy.   DISCHARGE WOUND CARE:  Keep dry.   DISCHARGE PHYSICAL THERAPY:  Weightbearing as tolerated.   DISCHARGE MEDICATIONS:  1. Norco 7.5/325 one to two p.o. q.4 to 6 p.r.n. pain.  2. Aspirin  325 mg p.o. b.i.d., this is x6 weeks.  3. Robaxin 500 mg p.o. q.6.  4. Iron 325 mg p.o. t.i.d. x3 weeks.  5. Colace 100 mg p.o. b.i.d. p.r.n.  6. MiraLax 17 g p.o. daily.  7. Glimepiride 4 mg one p.o. q.a.m.  8. Glimepiride 4 mg 1/2 tab p.o. q.p.m.  9. Metformin 500 mg one p.o. b.i.d.  10.Avandia 4 mg one p.o. daily.  11.NovoLog before meals 3 units t.i.d.  12.Levemir insulin 4 units subcu before supper.  13.Atenolol 25 mg one p.o. q.a.m.  14.Simvastatin 80 mg 1/2 tab p.o. q. 8 p.m.  15.Omeprazole 20 mg one p.o. q.a.m.  16.Hydrochlorothiazide 25 mg 1/2 tab p.o. q.a.m.  17.Fexofenadine 180 mg p.o. daily.  18.Flomax 0.4 mg one p.o. q.h.s.  19.Mavik 1 mg one p.o. q.a.m.  20.Fish oil 1200 mg p.o. b.i.d.  21.Vitamin D 1000 units two p.o. daily.   DISCHARGE FOLLOWUP:  Follow up with Dr. Charlann Boxer at phone number [phone] in  2 weeks for wound check.     ______________________________  Yetta Glassman. [address], [place]      Madlyn Frankel.  Charlann Boxer, M.D.  Electronically Signed    BLM/MEDQ  D:  02/13/2009  T:  02/13/2009  Job:  [id number]   cc:   Dr. Tanya Nones   Dr. Lucianne Muss   Dr. Laureen Ochs A. Rankin, M.D.  Fax: [phone]

## 2011-01-27 HISTORY — PX: OTHER SURGICAL HISTORY: SHX169

## 2011-09-02 DIAGNOSIS — I739 Peripheral vascular disease, unspecified: Secondary | ICD-10-CM | POA: Diagnosis not present

## 2011-09-02 DIAGNOSIS — E782 Mixed hyperlipidemia: Secondary | ICD-10-CM | POA: Diagnosis not present

## 2011-09-02 DIAGNOSIS — I119 Hypertensive heart disease without heart failure: Secondary | ICD-10-CM | POA: Diagnosis not present

## 2011-09-02 HISTORY — PX: TRANSTHORACIC ECHOCARDIOGRAM: SHX275

## 2011-09-02 HISTORY — PX: CARDIOVASCULAR STRESS TEST: SHX262

## 2011-09-17 DIAGNOSIS — E119 Type 2 diabetes mellitus without complications: Secondary | ICD-10-CM | POA: Diagnosis not present

## 2011-09-17 DIAGNOSIS — I119 Hypertensive heart disease without heart failure: Secondary | ICD-10-CM | POA: Diagnosis not present

## 2011-09-17 DIAGNOSIS — R002 Palpitations: Secondary | ICD-10-CM | POA: Diagnosis not present

## 2011-11-07 DIAGNOSIS — E785 Hyperlipidemia, unspecified: Secondary | ICD-10-CM | POA: Diagnosis not present

## 2011-11-07 DIAGNOSIS — E119 Type 2 diabetes mellitus without complications: Secondary | ICD-10-CM | POA: Diagnosis not present

## 2011-11-07 DIAGNOSIS — I10 Essential (primary) hypertension: Secondary | ICD-10-CM | POA: Diagnosis not present

## 2011-11-07 DIAGNOSIS — E559 Vitamin D deficiency, unspecified: Secondary | ICD-10-CM | POA: Diagnosis not present

## 2011-11-07 DIAGNOSIS — N62 Hypertrophy of breast: Secondary | ICD-10-CM | POA: Diagnosis not present

## 2011-11-25 DIAGNOSIS — Z0000 Encounter for general adult medical examination without abnormal findings: Secondary | ICD-10-CM | POA: Diagnosis not present

## 2011-11-25 DIAGNOSIS — Z125 Encounter for screening for malignant neoplasm of prostate: Secondary | ICD-10-CM | POA: Diagnosis not present

## 2011-12-02 DIAGNOSIS — H2510 Age-related nuclear cataract, unspecified eye: Secondary | ICD-10-CM | POA: Diagnosis not present

## 2011-12-23 DIAGNOSIS — H2510 Age-related nuclear cataract, unspecified eye: Secondary | ICD-10-CM | POA: Diagnosis not present

## 2011-12-24 DIAGNOSIS — E538 Deficiency of other specified B group vitamins: Secondary | ICD-10-CM | POA: Diagnosis not present

## 2011-12-29 DIAGNOSIS — H5500 Unspecified nystagmus: Secondary | ICD-10-CM | POA: Diagnosis not present

## 2011-12-29 DIAGNOSIS — H40009 Preglaucoma, unspecified, unspecified eye: Secondary | ICD-10-CM | POA: Diagnosis not present

## 2011-12-29 DIAGNOSIS — H2510 Age-related nuclear cataract, unspecified eye: Secondary | ICD-10-CM | POA: Diagnosis not present

## 2011-12-29 DIAGNOSIS — E119 Type 2 diabetes mellitus without complications: Secondary | ICD-10-CM | POA: Diagnosis not present

## 2012-01-30 DIAGNOSIS — E538 Deficiency of other specified B group vitamins: Secondary | ICD-10-CM | POA: Diagnosis not present

## 2012-02-09 DIAGNOSIS — H2181 Floppy iris syndrome: Secondary | ICD-10-CM | POA: Diagnosis not present

## 2012-02-09 DIAGNOSIS — E119 Type 2 diabetes mellitus without complications: Secondary | ICD-10-CM | POA: Diagnosis not present

## 2012-02-09 DIAGNOSIS — H2510 Age-related nuclear cataract, unspecified eye: Secondary | ICD-10-CM | POA: Diagnosis not present

## 2012-02-09 DIAGNOSIS — H269 Unspecified cataract: Secondary | ICD-10-CM | POA: Diagnosis not present

## 2012-02-24 DIAGNOSIS — E119 Type 2 diabetes mellitus without complications: Secondary | ICD-10-CM | POA: Diagnosis not present

## 2012-02-24 DIAGNOSIS — E538 Deficiency of other specified B group vitamins: Secondary | ICD-10-CM | POA: Diagnosis not present

## 2012-02-24 DIAGNOSIS — E785 Hyperlipidemia, unspecified: Secondary | ICD-10-CM | POA: Diagnosis not present

## 2012-02-24 DIAGNOSIS — I10 Essential (primary) hypertension: Secondary | ICD-10-CM | POA: Diagnosis not present

## 2012-03-24 DIAGNOSIS — E538 Deficiency of other specified B group vitamins: Secondary | ICD-10-CM | POA: Diagnosis not present

## 2012-03-31 DIAGNOSIS — R319 Hematuria, unspecified: Secondary | ICD-10-CM | POA: Diagnosis not present

## 2012-03-31 DIAGNOSIS — R310 Gross hematuria: Secondary | ICD-10-CM | POA: Diagnosis not present

## 2012-04-08 DIAGNOSIS — R310 Gross hematuria: Secondary | ICD-10-CM | POA: Diagnosis not present

## 2012-04-12 DIAGNOSIS — E782 Mixed hyperlipidemia: Secondary | ICD-10-CM | POA: Diagnosis not present

## 2012-04-12 DIAGNOSIS — R609 Edema, unspecified: Secondary | ICD-10-CM | POA: Diagnosis not present

## 2012-04-27 DIAGNOSIS — R310 Gross hematuria: Secondary | ICD-10-CM | POA: Diagnosis not present

## 2012-05-03 DIAGNOSIS — N400 Enlarged prostate without lower urinary tract symptoms: Secondary | ICD-10-CM | POA: Diagnosis not present

## 2012-05-03 DIAGNOSIS — R310 Gross hematuria: Secondary | ICD-10-CM | POA: Diagnosis not present

## 2012-05-05 DIAGNOSIS — E538 Deficiency of other specified B group vitamins: Secondary | ICD-10-CM | POA: Diagnosis not present

## 2012-05-12 DIAGNOSIS — Z23 Encounter for immunization: Secondary | ICD-10-CM | POA: Diagnosis not present

## 2012-05-26 DIAGNOSIS — I10 Essential (primary) hypertension: Secondary | ICD-10-CM | POA: Diagnosis not present

## 2012-05-26 DIAGNOSIS — E785 Hyperlipidemia, unspecified: Secondary | ICD-10-CM | POA: Diagnosis not present

## 2012-06-03 DIAGNOSIS — N400 Enlarged prostate without lower urinary tract symptoms: Secondary | ICD-10-CM | POA: Diagnosis not present

## 2012-06-03 DIAGNOSIS — D414 Neoplasm of uncertain behavior of bladder: Secondary | ICD-10-CM | POA: Diagnosis not present

## 2012-06-04 ENCOUNTER — Other Ambulatory Visit: Payer: Self-pay | Admitting: Urology

## 2012-06-09 ENCOUNTER — Encounter (HOSPITAL_COMMUNITY): Payer: Self-pay | Admitting: Pharmacy Technician

## 2012-06-09 NOTE — Progress Notes (Addendum)
EKG 04-12-2012 [facility] on chart CXR done 06-10-2012 in [facility]

## 2012-06-10 ENCOUNTER — Encounter (HOSPITAL_COMMUNITY): Payer: Self-pay

## 2012-06-10 ENCOUNTER — Encounter (HOSPITAL_COMMUNITY)
Admission: RE | Admit: 2012-06-10 | Discharge: 2012-06-10 | Disposition: A | Discharge status: Home / Self Care | Payer: Medicare Other | Source: Ambulatory Visit | Attending: Urology | Admitting: Urology

## 2012-06-10 ENCOUNTER — Ambulatory Visit (HOSPITAL_COMMUNITY)
Admission: RE | Admit: 2012-06-10 | Discharge: 2012-06-10 | Disposition: A | Discharge status: Home / Self Care | Payer: Medicare Other | Source: Ambulatory Visit | Attending: Urology | Admitting: Urology

## 2012-06-10 DIAGNOSIS — Z01812 Encounter for preprocedural laboratory examination: Secondary | ICD-10-CM | POA: Insufficient documentation

## 2012-06-10 DIAGNOSIS — Z01818 Encounter for other preprocedural examination: Secondary | ICD-10-CM | POA: Diagnosis not present

## 2012-06-10 LAB — BASIC METABOLIC PANEL
Calcium: 9.5 mg/dL (ref 8.4–10.5)
GFR calc Af Amer: 63 mL/min — ABNORMAL LOW (ref >90)
GFR calc non Af Amer: 54 mL/min — ABNORMAL LOW (ref >90)
Potassium: 4.7 mEq/L (ref 3.5–5.1)
Sodium: 137 mEq/L (ref 135–145)

## 2012-06-10 LAB — CBC
MCH: 32.4 pg (ref 26.0–34.0)
MCHC: 33.7 g/dL (ref 30.0–36.0)
Platelets: 165 10*3/uL (ref 150–400)

## 2012-06-10 LAB — SURGICAL PCR SCREEN
MRSA, PCR: NEGATIVE
Staphylococcus aureus: NEGATIVE

## 2012-06-10 NOTE — Patient Instructions (Addendum)
[name]  06/10/2012   Your procedure is scheduled on:  Thursday 06-17-2012  Report to [facility] at  AM. 0700  Call this number if you have problems the morning of surgery: [phone]   Remember: Not to take any diabetic medication morning of surgery, take 1/2 of your Novolog and Levimir Wednesday evening     Do not drink liquids or  eat food:After Midnight Wednesday night [name]      Take these medicines the morning of surgery with A SIP OF WATER: Atenolol and Omeprazole   Do not wear jewelry, make-up or nail polish.  Do not wear lotions, powders, or perfumes. You may wear deodorant.  Do not shave 48 hours prior to surgery. Men may shave face and neck.  Do not bring valuables to the hospital.  Contacts, dentures or bridgework may not be worn into surgery.  Leave suitcase in the car. After surgery it may be brought to your room.  For patients admitted to the hospital, checkout time is 11:00 AM the day of discharge.   Patients discharged the day of surgery will not be allowed to drive home.  Name and phone number of your driver: wife Corrie Dandy [phone]  See [facility] Preparing for surgery sheet.   Please read over the following fact sheets that you were given: MRSA Information

## 2012-06-17 ENCOUNTER — Ambulatory Visit (HOSPITAL_COMMUNITY): Payer: Medicare Other | Admitting: Anesthesiology

## 2012-06-17 ENCOUNTER — Encounter (HOSPITAL_COMMUNITY)
Admission: RE | Disposition: A | Discharge status: Home / Self Care | Payer: Self-pay | Source: Ambulatory Visit | Attending: Urology

## 2012-06-17 ENCOUNTER — Encounter (HOSPITAL_COMMUNITY): Payer: Self-pay | Admitting: Anesthesiology

## 2012-06-17 ENCOUNTER — Observation Stay (HOSPITAL_COMMUNITY)
Admission: RE | Admit: 2012-06-17 | Discharge: 2012-06-18 | Disposition: A | Discharge status: Home / Self Care | Payer: Medicare Other | Source: Ambulatory Visit | Attending: Urology | Admitting: Urology

## 2012-06-17 ENCOUNTER — Encounter (HOSPITAL_COMMUNITY): Payer: Self-pay | Admitting: *Deleted

## 2012-06-17 DIAGNOSIS — E119 Type 2 diabetes mellitus without complications: Secondary | ICD-10-CM | POA: Diagnosis not present

## 2012-06-17 DIAGNOSIS — I10 Essential (primary) hypertension: Secondary | ICD-10-CM | POA: Insufficient documentation

## 2012-06-17 DIAGNOSIS — Z79899 Other long term (current) drug therapy: Secondary | ICD-10-CM | POA: Insufficient documentation

## 2012-06-17 DIAGNOSIS — D303 Benign neoplasm of bladder: Principal | ICD-10-CM | POA: Insufficient documentation

## 2012-06-17 DIAGNOSIS — N400 Enlarged prostate without lower urinary tract symptoms: Secondary | ICD-10-CM | POA: Insufficient documentation

## 2012-06-17 DIAGNOSIS — N329 Bladder disorder, unspecified: Secondary | ICD-10-CM

## 2012-06-17 DIAGNOSIS — D414 Neoplasm of uncertain behavior of bladder: Secondary | ICD-10-CM | POA: Diagnosis not present

## 2012-06-17 DIAGNOSIS — Z794 Long term (current) use of insulin: Secondary | ICD-10-CM | POA: Insufficient documentation

## 2012-06-17 DIAGNOSIS — N4289 Other specified disorders of prostate: Secondary | ICD-10-CM | POA: Diagnosis not present

## 2012-06-17 DIAGNOSIS — R310 Gross hematuria: Secondary | ICD-10-CM | POA: Diagnosis not present

## 2012-06-17 HISTORY — PX: CYSTOSCOPY WITH BIOPSY: SHX5122

## 2012-06-17 HISTORY — PX: TRANSURETHRAL RESECTION OF PROSTATE: SHX73

## 2012-06-17 LAB — BASIC METABOLIC PANEL
BUN: 21 mg/dL (ref 6–23)
Calcium: 8.7 mg/dL (ref 8.4–10.5)
Creatinine, Ser: 1.01 mg/dL (ref 0.50–1.35)
GFR calc Af Amer: 78 mL/min — ABNORMAL LOW (ref >90)
GFR calc non Af Amer: 68 mL/min — ABNORMAL LOW (ref >90)

## 2012-06-17 LAB — GLUCOSE, CAPILLARY: Glucose-Capillary: 184 mg/dL — ABNORMAL HIGH (ref 70–99)

## 2012-06-17 LAB — HEMOGLOBIN AND HEMATOCRIT, BLOOD: Hemoglobin: 11 g/dL — ABNORMAL LOW (ref 13.0–17.0)

## 2012-06-17 SURGERY — TRANSURETHRAL RESECTION OF THE PROSTATE WITH GYRUS INSTRUMENTS
Anesthesia: General | Wound class: Clean Contaminated

## 2012-06-17 MED ORDER — INSULIN ASPART 100 UNIT/ML ~~LOC~~ SOLN
0.0000 [IU] | Freq: Three times a day (TID) | SUBCUTANEOUS | Status: DC
  Administered 2012-06-17: 4 [IU] via SUBCUTANEOUS

## 2012-06-17 MED ORDER — ATORVASTATIN CALCIUM 40 MG PO TABS
40.0000 mg | ORAL_TABLET | Freq: Every day | ORAL | Status: DC
  Administered 2012-06-17: 40 mg via ORAL
  Filled 2012-06-17 (×2): qty 1

## 2012-06-17 MED ORDER — CEFAZOLIN SODIUM-DEXTROSE 2-3 GM-% IV SOLR
INTRAVENOUS | Status: AC
  Filled 2012-06-17: qty 50

## 2012-06-17 MED ORDER — ACETAMINOPHEN 325 MG PO TABS: 650.0000 mg | ORAL_TABLET | ORAL | Status: DC | PRN

## 2012-06-17 MED ORDER — PROMETHAZINE HCL 25 MG/ML IJ SOLN: 6.2500 mg | INTRAMUSCULAR | Status: DC | PRN

## 2012-06-17 MED ORDER — STERILE WATER FOR IRRIGATION IR SOLN
Status: DC | PRN
  Administered 2012-06-17: 500 mL

## 2012-06-17 MED ORDER — OXYCODONE HCL 5 MG PO TABS: 5.0000 mg | ORAL_TABLET | Freq: Once | ORAL | Status: DC | PRN

## 2012-06-17 MED ORDER — HYDROCHLOROTHIAZIDE 12.5 MG PO CAPS
12.5000 mg | ORAL_CAPSULE | Freq: Every day | ORAL | Status: DC
  Administered 2012-06-17 – 2012-06-18 (×2): 12.5 mg via ORAL
  Filled 2012-06-17 (×2): qty 1

## 2012-06-17 MED ORDER — FENTANYL CITRATE 0.05 MG/ML IJ SOLN: 25.0000 ug | INTRAMUSCULAR | Status: DC | PRN

## 2012-06-17 MED ORDER — ACETAMINOPHEN 10 MG/ML IV SOLN: 1000.0000 mg | Freq: Once | INTRAVENOUS | Status: DC | PRN

## 2012-06-17 MED ORDER — BELLADONNA ALKALOIDS-OPIUM 16.2-60 MG RE SUPP
RECTAL | Status: AC
  Filled 2012-06-17: qty 1

## 2012-06-17 MED ORDER — ACETAMINOPHEN 10 MG/ML IV SOLN
1000.0000 mg | Freq: Four times a day (QID) | INTRAVENOUS | Status: DC
  Administered 2012-06-17 – 2012-06-18 (×3): 1000 mg via INTRAVENOUS
  Filled 2012-06-17 (×4): qty 100

## 2012-06-17 MED ORDER — INSULIN ASPART 100 UNIT/ML ~~LOC~~ SOLN: 0.0000 [IU] | Freq: Every day | SUBCUTANEOUS | Status: DC

## 2012-06-17 MED ORDER — TRANDOLAPRIL 1 MG PO TABS
1.0000 mg | ORAL_TABLET | Freq: Every day | ORAL | Status: DC
  Administered 2012-06-17 – 2012-06-18 (×2): 1 mg via ORAL
  Filled 2012-06-17 (×2): qty 1

## 2012-06-17 MED ORDER — BACITRACIN-NEOMYCIN-POLYMYXIN 400-5-5000 EX OINT: 1.0000 "application " | TOPICAL_OINTMENT | Freq: Three times a day (TID) | CUTANEOUS | Status: DC | PRN

## 2012-06-17 MED ORDER — HYDROMORPHONE HCL PF 1 MG/ML IJ SOLN
INTRAMUSCULAR | Status: AC
  Filled 2012-06-17: qty 1

## 2012-06-17 MED ORDER — SODIUM CHLORIDE 0.9 % IR SOLN
3000.0000 mL | Status: DC
  Administered 2012-06-17 – 2012-06-18 (×4): 3000 mL

## 2012-06-17 MED ORDER — HYDROMORPHONE HCL PF 1 MG/ML IJ SOLN
0.2500 mg | INTRAMUSCULAR | Status: DC | PRN
  Administered 2012-06-17: 0.5 mg via INTRAVENOUS

## 2012-06-17 MED ORDER — 0.9 % SODIUM CHLORIDE (POUR BTL) OPTIME
TOPICAL | Status: DC | PRN
  Administered 2012-06-17: 1000 mL

## 2012-06-17 MED ORDER — CEFAZOLIN SODIUM-DEXTROSE 2-3 GM-% IV SOLR
2.0000 g | INTRAVENOUS | Status: AC
  Administered 2012-06-17: 2 g via INTRAVENOUS

## 2012-06-17 MED ORDER — BELLADONNA ALKALOIDS-OPIUM 16.2-60 MG RE SUPP: 1.0000 | Freq: Four times a day (QID) | RECTAL | Status: DC | PRN

## 2012-06-17 MED ORDER — MEPERIDINE HCL 50 MG/ML IJ SOLN: 6.2500 mg | INTRAMUSCULAR | Status: DC | PRN

## 2012-06-17 MED ORDER — OXYCODONE HCL 5 MG/5ML PO SOLN
5.0000 mg | Freq: Once | ORAL | Status: DC | PRN
  Filled 2012-06-17: qty 5

## 2012-06-17 MED ORDER — ACETAMINOPHEN 10 MG/ML IV SOLN
INTRAVENOUS | Status: AC
  Filled 2012-06-17: qty 100

## 2012-06-17 MED ORDER — GLIMEPIRIDE 4 MG PO TABS
4.0000 mg | ORAL_TABLET | Freq: Every day | ORAL | Status: DC
  Administered 2012-06-18: 4 mg via ORAL
  Filled 2012-06-17 (×2): qty 1

## 2012-06-17 MED ORDER — LORATADINE 10 MG PO TABS
10.0000 mg | ORAL_TABLET | Freq: Every day | ORAL | Status: DC
  Administered 2012-06-17 – 2012-06-18 (×2): 10 mg via ORAL
  Filled 2012-06-17 (×2): qty 1

## 2012-06-17 MED ORDER — ATENOLOL 12.5 MG HALF TABLET
12.5000 mg | ORAL_TABLET | Freq: Every day | ORAL | Status: DC
  Administered 2012-06-17: 12.5 mg via ORAL
  Filled 2012-06-17 (×2): qty 1

## 2012-06-17 MED ORDER — FENTANYL CITRATE 0.05 MG/ML IJ SOLN
INTRAMUSCULAR | Status: DC | PRN
  Administered 2012-06-17 (×2): 25 ug via INTRAVENOUS
  Administered 2012-06-17: 50 ug via INTRAVENOUS
  Administered 2012-06-17: 25 ug via INTRAVENOUS

## 2012-06-17 MED ORDER — ACETAMINOPHEN 10 MG/ML IV SOLN
INTRAVENOUS | Status: DC | PRN
  Administered 2012-06-17: 1000 mg via INTRAVENOUS

## 2012-06-17 MED ORDER — BELLADONNA ALKALOIDS-OPIUM 16.2-60 MG RE SUPP
RECTAL | Status: DC | PRN
  Administered 2012-06-17: 1 via RECTAL

## 2012-06-17 MED ORDER — PANTOPRAZOLE SODIUM 40 MG PO TBEC
40.0000 mg | DELAYED_RELEASE_TABLET | Freq: Every day | ORAL | Status: DC
  Administered 2012-06-17 – 2012-06-18 (×2): 40 mg via ORAL
  Filled 2012-06-17 (×2): qty 1

## 2012-06-17 MED ORDER — SODIUM CHLORIDE 0.9 % IV SOLN
INTRAVENOUS | Status: DC
  Administered 2012-06-17 – 2012-06-18 (×2): via INTRAVENOUS

## 2012-06-17 MED ORDER — LIDOCAINE HCL 2 % EX GEL
CUTANEOUS | Status: DC | PRN
  Administered 2012-06-17: 1 via URETHRAL

## 2012-06-17 MED ORDER — INSULIN DETEMIR 100 UNIT/ML ~~LOC~~ SOLN
6.0000 [IU] | Freq: Every day | SUBCUTANEOUS | Status: DC
  Administered 2012-06-17: 6 [IU] via SUBCUTANEOUS
  Filled 2012-06-17: qty 10

## 2012-06-17 MED ORDER — TAMSULOSIN HCL 0.4 MG PO CAPS
0.4000 mg | ORAL_CAPSULE | Freq: Every day | ORAL | Status: DC
  Administered 2012-06-17: 0.4 mg via ORAL
  Filled 2012-06-17 (×2): qty 1

## 2012-06-17 MED ORDER — ONDANSETRON HCL 4 MG/2ML IJ SOLN
4.0000 mg | INTRAMUSCULAR | Status: DC | PRN
  Administered 2012-06-17: 4 mg via INTRAVENOUS
  Filled 2012-06-17: qty 2

## 2012-06-17 MED ORDER — ONDANSETRON HCL 4 MG/2ML IJ SOLN
INTRAMUSCULAR | Status: DC | PRN
  Administered 2012-06-17: 4 mg via INTRAVENOUS

## 2012-06-17 MED ORDER — ATENOLOL 12.5 MG HALF TABLET
25.0000 mg | ORAL_TABLET | Freq: Every day | ORAL | Status: DC
  Administered 2012-06-18: 25 mg via ORAL
  Filled 2012-06-17: qty 2

## 2012-06-17 MED ORDER — LIDOCAINE HCL 2 % EX GEL
CUTANEOUS | Status: AC
  Filled 2012-06-17: qty 10

## 2012-06-17 MED ORDER — INSULIN ASPART 100 UNIT/ML ~~LOC~~ SOLN
6.0000 [IU] | Freq: Three times a day (TID) | SUBCUTANEOUS | Status: DC
  Administered 2012-06-17 – 2012-06-18 (×2): 6 [IU] via SUBCUTANEOUS

## 2012-06-17 MED ORDER — OXYCODONE HCL 5 MG PO TABS
5.0000 mg | ORAL_TABLET | ORAL | Status: DC | PRN
Start: 2012-06-17 — End: 2012-06-18

## 2012-06-17 MED ORDER — CEFAZOLIN SODIUM-DEXTROSE 2-3 GM-% IV SOLR
2.0000 g | Freq: Three times a day (TID) | INTRAVENOUS | Status: AC
  Administered 2012-06-17 (×2): 2 g via INTRAVENOUS
  Filled 2012-06-17 (×2): qty 50

## 2012-06-17 MED ORDER — SODIUM CHLORIDE 0.9 % IR SOLN
Status: DC | PRN
  Administered 2012-06-17: 42000 mL via INTRAVESICAL

## 2012-06-17 MED ORDER — HYDROCHLOROTHIAZIDE 25 MG PO TABS
12.5000 mg | ORAL_TABLET | Freq: Every day | ORAL | Status: DC
  Filled 2012-06-17: qty 0.5

## 2012-06-17 MED ORDER — METFORMIN HCL 500 MG PO TABS
1000.0000 mg | ORAL_TABLET | Freq: Two times a day (BID) | ORAL | Status: DC
  Administered 2012-06-17 – 2012-06-18 (×2): 1000 mg via ORAL
  Filled 2012-06-17 (×4): qty 2

## 2012-06-17 MED ORDER — LACTATED RINGERS IV SOLN
INTRAVENOUS | Status: DC
  Administered 2012-06-17: 11:00:00 via INTRAVENOUS
  Administered 2012-06-17: 1000 mL via INTRAVENOUS

## 2012-06-17 MED ORDER — PROPOFOL 10 MG/ML IV BOLUS
INTRAVENOUS | Status: DC | PRN
  Administered 2012-06-17: 150 mg via INTRAVENOUS
  Administered 2012-06-17: 50 mg via INTRAVENOUS

## 2012-06-17 MED ORDER — DOCUSATE SODIUM 100 MG PO CAPS
100.0000 mg | ORAL_CAPSULE | Freq: Two times a day (BID) | ORAL | Status: DC
  Administered 2012-06-17 – 2012-06-18 (×2): 100 mg via ORAL
  Filled 2012-06-17 (×3): qty 1

## 2012-06-17 SURGICAL SUPPLY — 38 items
BAG URINE DRAINAGE (UROLOGICAL SUPPLIES) IMPLANT
BAG URO CATCHER STRL LF (DRAPE) ×2 IMPLANT
BLADE SURG 15 STRL LF DISP TIS (BLADE) IMPLANT
BLADE SURG 15 STRL SS (BLADE)
CATH COUDE 24FR 5CC (CATHETERS) IMPLANT
CATH HEMA 3WAY 30CC 24FR COUDE (CATHETERS) ×2 IMPLANT
CATH RIBBED COUDE  30CC (CATHETERS)
CATH RIBBED COUDE 30CC (CATHETERS)
CATH ROBINSON RED A/P 16FR (CATHETERS) IMPLANT
CLOTH BEACON ORANGE TIMEOUT ST (SAFETY) ×2 IMPLANT
DRAPE CAMERA CLOSED 9X96 (DRAPES) ×2 IMPLANT
ELECT LOOP MED HF 24F 12D (CUTTING LOOP) ×2 IMPLANT
ELECT REM PT RETURN 9FT ADLT (ELECTROSURGICAL)
ELECT RESECT VAPORIZE 12D CBL (ELECTRODE) ×2 IMPLANT
ELECTRODE REM PT RTRN 9FT ADLT (ELECTROSURGICAL) IMPLANT
GLOVE BIO SURGEON STRL SZ7 (GLOVE) ×4 IMPLANT
GLOVE BIOGEL M 7.0 STRL (GLOVE) ×2 IMPLANT
GLOVE BIOGEL PI IND STRL 7.5 (GLOVE) ×1 IMPLANT
GLOVE BIOGEL PI INDICATOR 7.5 (GLOVE) ×1
GOWN PREVENTION PLUS XLARGE (GOWN DISPOSABLE) ×6 IMPLANT
GOWN STRL NON-REIN LRG LVL3 (GOWN DISPOSABLE) ×2 IMPLANT
GUIDEWIRE STR DUAL SENSOR (WIRE) IMPLANT
HOLDER FOLEY CATH W/STRAP (MISCELLANEOUS) IMPLANT
KIT ASPIRATION TUBING (SET/KITS/TRAYS/PACK) ×2 IMPLANT
KIT SUPRAPUBIC CATH (MISCELLANEOUS) IMPLANT
MANIFOLD NEPTUNE II (INSTRUMENTS) ×2 IMPLANT
NDL SAFETY ECLIPSE 18X1.5 (NEEDLE) IMPLANT
NEEDLE HYPO 18GX1.5 SHARP (NEEDLE)
NEEDLE HYPO 22GX1.5 SAFETY (NEEDLE) IMPLANT
NEEDLE SPNL 18GX3.5 QUINCKE PK (NEEDLE) IMPLANT
NS IRRIG 1000ML POUR BTL (IV SOLUTION) IMPLANT
PACK CYSTO (CUSTOM PROCEDURE TRAY) ×2 IMPLANT
SCRUB PCMX 4 OZ (MISCELLANEOUS) ×2 IMPLANT
SUT ETHILON 3 0 PS 1 (SUTURE) IMPLANT
SYR 30ML LL (SYRINGE) IMPLANT
SYRINGE IRR TOOMEY STRL 70CC (SYRINGE) ×2 IMPLANT
TUBING CONNECTING 10 (TUBING) ×2 IMPLANT
WATER STERILE IRR 3000ML UROMA (IV SOLUTION) IMPLANT

## 2012-06-17 NOTE — Anesthesia Preprocedure Evaluation (Addendum)
Anesthesia Evaluation  Patient identified by MRN, date of birth, ID band Patient awake    Reviewed: Allergy & Precautions, H&P , NPO status , Patient's Chart, lab work & pertinent test results, reviewed documented beta blocker date and time   Airway Mallampati: II TM Distance: >3 FB Neck ROM: Full    Dental  (+) Dental Advisory Given, Poor Dentition and Teeth Intact   Pulmonary neg pulmonary ROS,  breath sounds clear to auscultation  Pulmonary exam normal       Cardiovascular hypertension, Pt. on medications and Pt. on home beta blockers Rhythm:Regular Rate:Normal  Stress test 08/2011 showed no inducible ischemia   Neuro/Psych negative neurological ROS  negative psych ROS   GI/Hepatic negative GI ROS, Neg liver ROS,   Endo/Other  diabetes, Type 2, Oral Hypoglycemic Agents and Insulin Dependent  Renal/GU negative Renal ROS     Musculoskeletal negative musculoskeletal ROS (+)   Abdominal   Peds  Hematology negative hematology ROS (+)   Anesthesia Other Findings   Reproductive/Obstetrics                         Anesthesia Physical Anesthesia Plan  ASA: III  Anesthesia Plan: General   Post-op Pain Management:    Induction: Intravenous  Airway Management Planned:   Additional Equipment:   Intra-op Plan:   Post-operative Plan: Extubation in OR  Informed Consent: I have reviewed the patients History and Physical, chart, labs and discussed the procedure including the risks, benefits and alternatives for the proposed anesthesia with the patient or authorized representative who has indicated his/her understanding and acceptance.   Dental advisory given  Plan Discussed with: CRNA  Anesthesia Plan Comments:        Anesthesia Quick Evaluation

## 2012-06-17 NOTE — Progress Notes (Signed)
GU  Doing well. Had some emesis, but no nausea now. No pain.  Filed Vitals:   06/17/12 1345  BP: 138/52  Pulse: 53  Temp: 97.6 F (36.4 C)  Resp: 14   Gen: NAD Abd: soft, NTTP GU: foley draining light pink on CBI  A/P: BPH, Bladder mass. -CBI tonight -Probably d/c home tomorrow.

## 2012-06-17 NOTE — H&P (Signed)
Urology History and Physical Exam  CC: BPH, Bladder lesion.  HPI: 78 year old male with benign prostatic hypertrophy and bladder lesion presents today for transurethral resection of the prostate and bladder biopsy. He initially presented with gross hematuria. Office cystoscopy revealed a very large baby and lobe with severe bladder trabeculation. There was a small polyp on the intravesical portion of the prostate. This was discovered in October 2013. CT hematuria protocol April 27, 2012 revealed no upper tract pathology. The patient has urinary urgency with occasional urge urinary incontinence. He also has a weak urinary stream. Uroflow revealed a max flow of 7 mL per second while voiding 94 cc. Post for residual by ultrasound was 15 mL. Transrectal ultrasound volume of the prostate was 66 cc. PSA was 2.3 in April 2013. UA was negative for signs of infection on 06/03/12. We have reviewed the risks, benefits, alternatives, and likelihood of achieving goals.  PMH: Past Medical History  Diagnosis Date  . Diabetes mellitus   . Colon polyps   . BPH (benign prostatic hypertrophy)   . Osteoarthritis   . History of kidney stones     PSH: Past Surgical History  Procedure Date  . Appendectomy   . Partial amputaion left foot   . Knee cartilage surgery     Arthroscope    Allergies: Allergies  Allergen Reactions  . Avodart (Dutasteride) Swelling  . Ibuprofen Nausea And Vomiting  . Morphine Nausea And Vomiting    Medications: No prescriptions prior to admission     Social History: History   Social History  . Marital Status: Married    Spouse Name: N/A    Number of Children: N/A  . Years of Education: N/A   Occupational History  . Not on file.   Social History Main Topics  . Smoking status: Former Games developer  . Smokeless tobacco: Not on file  . Alcohol Use:   . Drug Use:   . Sexually Active:    Other Topics Concern  . Not on file   Social History Narrative  . No narrative  on file    Family History: No family history on file.  Review of Systems: Positive: None Negative: Fever, SOB, or chest pain.  A further 10 point review of systems was negative except what is listed in the HPI.  Physical Exam: Filed Vitals:   06/17/12 0659  BP: 148/73  Pulse: 56  Temp: 97.9 F (36.6 C)  Resp: 20    General: No acute distress.  Awake. Head:  Normocephalic.  Atraumatic. ENT:  EOMI.  Mucous membranes moist Neck:  Supple.  No lymphadenopathy. Pulmonary: Equal effort bilaterally.  Clear to auscultation bilaterally. Abdomen: Soft.  Non- tender to palpation. Skin:  Normal turgor.  No visible rash. Extremity: No gross deformity of bilateral upper extremities.  No gross deformity of    bilateral lower extremities. Neurologic: Alert. Appropriate mood.    Studies:  No results found for this basename: HGB:2,WBC:2,PLT:2 in the last 72 hours  No results found for this basename: NA:2,K:2,CL:2,CO2:2,BUN:2,CREATININE:2,CALCIUM:2,MAGNESIUM:2,GFRNONAA:2,GFRAA:2 in the last 72 hours   No results found for this basename: PT:2,INR:2,APTT:2 in the last 72 hours   No components found with this basename: ABG:2    Assessment:  BPH. Bladder lesion.  Plan: To the OR for cystoscopy, transurethral resection of prostate, bladder biopsy.

## 2012-06-17 NOTE — Anesthesia Postprocedure Evaluation (Signed)
Anesthesia Post Note  Patient: [name]  Procedure(s) Performed: Procedure(s) (LRB): TRANSURETHRAL RESECTION OF THE PROSTATE WITH GYRUS INSTRUMENTS (N/A) CYSTOSCOPY WITH BIOPSY (N/A)  Anesthesia type: General  Patient location: PACU  Post pain: Pain level controlled  Post assessment: Post-op Vital signs reviewed  Last Vitals: BP 131/52  Pulse 48  Temp 36.6 C  Resp 14  SpO2 100%  Post vital signs: Reviewed  Level of consciousness: sedated  Complications: No apparent anesthesia complications

## 2012-06-17 NOTE — Op Note (Signed)
Urology Operative Report  Date of Procedure: 06/17/12   Surgeon: Natalia Leatherwood, MD Assistant: None  Preoperative Diagnosis: Bladder tumor, BPH Postoperative Diagnosis:  Same  Procedure(s): Transurethral resection of prostate Bladder biopsy Cystoscopy  Estimated blood loss: 150cc  Specimen: Bladder biopsy & TURP chips to pathology.  Drains: Foley  Complications: None  Findings: Median lobe of prostate. Small bladder lesion.  History of present illness: 78 year old male presented to my clinic with gross hematuria. He also had significant urinary tract symptoms. He was noted to have an obstructing median lobe of the prostate and a small lesion in the intravesical portion of the prostate. He presented today for bladder biopsy and transurethral resection of prostate.   Procedure in detail: After informed consent was obtained, the patient was taken to the operating room. They were placed in the supine position. SCDs were turned on and in place. IV antibiotics were infused, and general anesthesia was induced. A timeout was performed in which the correct patient, surgical site, and procedure were identified and agreed upon by the team.  The patient was placed in a dorsolithotomy position, making sure to pad all pertinent neurovascular pressure points. The genitals were prepped and draped in the usual sterile fashion.   The visual obturator to the gyrus resectoscope was advanced through the urethra and into the bladder. There was noted to be a very large median lobe obstructing the bladder. Both ureter orifices were then applied. The lesion on the intravesical lobe of the prostate previously seen on office cystoscopy was seen on the right lateral lobe of the intravesical prostate. Resectoscope loop was used to resect this lesion and it was sent to pathology.  Attention was then turned to the median lobe of the prostate. A groove was made at the 6:00 position and carried back proximal to  the verumontanum. A groove was then created on either side of the vera montanum making sure to remain proximal to the urethral sphincter. Resection was then carried down on the left side to the capsule. Combination of loop resection and button resection were used while resecting in normal saline. Hemostasis was maintained. Once this had been adequately resected down to the capsule attention was turned to the right lobe of the prostate. This was also resected down to the capsule of the prostate. There was a small perforation in the capsule of the prostate on the right side. There was good hemostasis maintained. I did not resect much of the anterior tissue as it was not obstructing and I felt that this could help prevent a bladder neck contracture. All of the TURP chips were then removed from the bladder and sent for permanent pathology. A 24 French three-way hematuria catheter was placed and connected to continuous bladder irrigation. I was able to hand irrigate his catheter to light pink. There was no return of clot or prostate chips. Before placing the catheter, 10 cc of lidocaine jelly were placed into the urethra. After the catheter was placed, a belladonna and opium suppository was placed into the rectum. Rectal examination revealed no prostate nodules.  This completed the procedure. The patient was placed back in a supine position. Anesthesia was reversed, and he was taken to the PACU in stable condition. He will be admitted overnight for observation for continuous bladder irrigation.

## 2012-06-17 NOTE — Preoperative (Signed)
Beta Blockers   Reason not to administer Beta Blockers2:Not Applicable 

## 2012-06-17 NOTE — Transfer of Care (Signed)
Immediate Anesthesia Transfer of Care Note  Patient: [name]  Procedure(s) Performed: Procedure(s) (LRB) with comments: TRANSURETHRAL RESECTION OF THE PROSTATE WITH GYRUS INSTRUMENTS (N/A) CYSTOSCOPY WITH BIOPSY (N/A)  Patient Location: PACU  Anesthesia Type:General  Level of Consciousness: awake, oriented and patient cooperative  Airway & Oxygen Therapy: Patient Spontanous Breathing and Patient connected to face mask oxygen  Post-op Assessment: Report given to PACU RN and Post -op Vital signs reviewed and stable  Post vital signs: Reviewed and stable  Complications: No apparent anesthesia complications

## 2012-06-18 ENCOUNTER — Encounter (HOSPITAL_COMMUNITY): Payer: Self-pay | Admitting: Urology

## 2012-06-18 MED ORDER — CEPHALEXIN 500 MG PO CAPS: 500.0000 mg | ORAL_CAPSULE | Freq: Three times a day (TID) | ORAL | Status: DC

## 2012-06-18 MED ORDER — BACITRACIN-NEOMYCIN-POLYMYXIN 400-5-5000 EX OINT: 1.0000 "application " | TOPICAL_OINTMENT | Freq: Three times a day (TID) | CUTANEOUS | Status: DC | PRN

## 2012-06-18 MED ORDER — OXYCODONE HCL 5 MG PO TABS: 5.0000 mg | ORAL_TABLET | ORAL | Status: DC | PRN

## 2012-06-18 MED ORDER — HYOSCYAMINE SULFATE 0.125 MG PO TABS: 0.1250 mg | ORAL_TABLET | ORAL | Status: DC | PRN

## 2012-06-18 NOTE — Discharge Summary (Signed)
Physician Discharge Summary  Patient ID: MORDCHE PARPART MRN: [id number] DOB/AGE: 76/27/1930 76 y.o.  Admit date: 06/17/2012 Discharge date: 06/18/2012  Admission Diagnoses: BPH. Bladder lesion.  Discharge Diagnoses:  BPH. Bladder lesion.  Discharged Condition: good  Hospital Course:  This patient was admitted postoperatively following transurethral resection of the prostate and bladder biopsy for benign prostatic hypertrophy and bladder lesion for continuous bladder irrigation. The patient's bladder irrigation was able to be titrated down to off early on postoperative day one. His urine remained light pink. He was able to tolerate a regular diet and control his pain with oral medications. He was able to ambulate early on the day of surgery. It was felt that he could be discharged home with his Foley catheter in place.  He was given pain medication and he will followup for voiding trial next week. He was given a prescription of Keflex to begin the day before his voiding trial.  Consults: None  Significant Diagnostic Studies: None  Treatments: surgery: TURP, bladder biopsy and Continuous bladder irrigation.  Discharge Exam: Blood pressure 109/51, pulse 56, temperature 97.6 F (36.4 C), temperature source Oral, resp. rate 16, height 5\' 8"  (1.727 m), weight 93.2 kg (205 lb 7.5 oz), SpO2 95.00%. See PE in progress note from date of discharge.  Disposition: Home: self-care  Discharge Orders    Future Orders Please Complete By Expires   Discharge patient          Medication List     As of 06/18/2012  8:03 AM    STOP taking these medications         aspirin 81 MG tablet      TAKE these medications         atenolol 25 MG tablet   Commonly known as: TENORMIN   Take 12.5-25 mg by mouth daily. 1 in the morning and one-half at bedtime      cephALEXin 500 MG capsule   Commonly known as: KEFLEX   Take 1 capsule (500 mg total) by mouth 3 (three) times daily.     cholecalciferol 1000 UNITS tablet   Commonly known as: VITAMIN D   Take 1,000 Units by mouth daily.      docusate sodium 100 MG capsule   Commonly known as: COLACE   Take 100 mg by mouth 2 (two) times daily.      ferrous gluconate 325 MG tablet   Commonly known as: FERGON   Take 325 mg by mouth daily with breakfast.      glimepiride 4 MG tablet   Commonly known as: AMARYL   Take 4 mg by mouth daily.      hydrochlorothiazide 25 MG tablet   Commonly known as: HYDRODIURIL   Take 12.5 mg by mouth daily.      hyoscyamine 0.125 MG tablet   Commonly known as: LEVSIN, ANASPAZ   Take 1 tablet (0.125 mg total) by mouth every 4 (four) hours as needed for cramping (bladder spasms).      insulin aspart 100 UNIT/ML injection   Commonly known as: novoLOG   Inject 6 Units into the skin 3 (three) times daily before meals.      insulin detemir 100 UNIT/ML injection   Commonly known as: LEVEMIR   Inject 6 Units into the skin at bedtime.      loratadine 10 MG tablet   Commonly known as: CLARITIN   Take 10 mg by mouth daily.      metFORMIN 1000 MG tablet  Commonly known as: GLUCOPHAGE   Take 1,000 mg by mouth 2 (two) times daily with a meal.      neomycin-bacitracin-polymyxin ointment   Commonly known as: NEOSPORIN   Apply 1 application topically 3 (three) times daily as needed (catheter irritation). apply to eye      omeprazole 20 MG capsule   Commonly known as: PRILOSEC   Take 20 mg by mouth daily.      oxyCODONE 5 MG immediate release tablet   Commonly known as: Oxy IR/ROXICODONE   Take 1-2 tablets (5-10 mg total) by mouth every 4 (four) hours as needed for pain.      simvastatin 80 MG tablet   Commonly known as: ZOCOR   Take 40 mg by mouth at bedtime.      Tamsulosin HCl 0.4 MG Caps   Commonly known as: FLOMAX   Take 0.4 mg by mouth daily after supper.      trandolapril 1 MG tablet   Commonly known as: MAVIK   Take 1 mg by mouth daily.      VITAMIN B-12 IJ   Inject  1,000 mcg as directed every 21 ( twenty-one) days.           Follow-up Information    Follow up with [facility], NP. On 06/22/2012. (10:00 am)    Contact information:   [address] [facility] [place] [address] [phone]          Signed: Milford Cage 06/18/2012, 8:03 AM

## 2012-06-18 NOTE — Progress Notes (Signed)
Urology Progress Note  Subjective:     No acute urologic events overnight. Positive early ambulation. Tolerating regular diet. Pain controlled. CBI turned off early this morning.  ROS: Negative: nausea  Objective:  Patient Vitals for the past 24 hrs:  BP Temp Temp src Pulse Resp SpO2 Height Weight  06/18/12 0425 109/51 mmHg 97.6 F (36.4 C) Oral 56  16  95 % - -  06/18/12 0137 119/55 mmHg 98 F (36.7 C) Oral 51  16  96 % - -  06/17/12 2117 128/55 mmHg 97.6 F (36.4 C) Oral 53  16  98 % - -  06/17/12 1750 134/72 mmHg - - 106  16  95 % - -  06/17/12 1345 138/52 mmHg 97.6 F (36.4 C) Oral 53  14  99 % 5\' 8"  (1.727 m) 93.2 kg (205 lb 7.5 oz)  06/17/12 1330 126/51 mmHg - - 49  16  100 % - -  06/17/12 1315 131/52 mmHg 97.7 F (36.5 C) - 48  14  100 % - -  06/17/12 1300 131/46 mmHg - - 50  15  100 % - -  06/17/12 1245 128/49 mmHg - - 49  14  100 % - -  06/17/12 1230 136/48 mmHg - - 51  14  100 % - -  06/17/12 1215 143/55 mmHg - - 49  12  100 % - -  06/17/12 1210 147/57 mmHg 97.8 F (36.6 C) - 54  16  - - -  06/17/12 0659 148/73 mmHg 97.9 F (36.6 C) - 56  20  97 % - -    Physical Exam: General:  No acute distress, awake Cardiovascular:    [x]   S1/S2 present, RRR  []   Irregularly irregular Chest:  CTA-B Abdomen:               []  Soft, appropriately TTP  [x]  Soft, NTTP  []  Soft, appropriately TTP, incision(s) clean/dry/intact  Genitourinary: No edema. Foley in place. Foley:  Draining light pink urine off CBI.    I/O last 3 completed shifts: In: 8435 [P.O.:960; I.V.:1875; ZOXWR:6045; IV Piggyback:150] Out: 2776 [Urine:2775; Emesis/NG output:1]  Recent Labs  [facility] 06/17/12 1238   HGB 11.0*   WBC --   PLT --    Recent Labs  [facility] 06/17/12 1238   NA 136   K 4.2   CL 102   CO2 27   BUN 21   CREATININE 1.01   CALCIUM 8.7   GFRNONAA 68*   GFRAA 78*     No results found for this basename: PT:2,INR:2,APTT:2 in the last 72 hours   No components found with  this basename: ABG:2    Length of stay: 1 days.  Assessment: BPH. Bladder lesion. POD#1 TURP, Bladder biopsy.   Plan: -Saline lock IV. -Discharge home with catheter in place.   Natalia Leatherwood, MD [phone]

## 2012-06-18 NOTE — Care Management Note (Addendum)
    Page 1 of 1   06/18/2012     12:11:37 PM   CARE MANAGEMENT NOTE 06/18/2012  Patient:  [name], [name]   Account Number:  [id number]  Date Initiated:  06/18/2012  Documentation initiated by:  Lanier Clam  Subjective/Objective Assessment:   ADMITTED W/BPH     Action/Plan:   FROM HOME   Anticipated DC Date:  06/18/2012   Anticipated DC Plan:  HOME/SELF CARE         Choice offered to / List presented to:             Status of service:  Completed, signed off Medicare Important Message given?   (If response is "NO", the following Medicare IM given date fields will be blank) Date Medicare IM given:   Date Additional Medicare IM given:    Discharge Disposition:  HOME/SELF CARE  Per UR Regulation:  Reviewed for med. necessity/level of care/duration of stay  If discussed at Long Length of Stay Meetings, dates discussed:    Comments:  06/18/12 [facility] Glynn Yepes RN,BSN NCM [phone]

## 2012-07-09 DIAGNOSIS — R350 Frequency of micturition: Secondary | ICD-10-CM | POA: Diagnosis not present

## 2012-07-09 DIAGNOSIS — R310 Gross hematuria: Secondary | ICD-10-CM | POA: Diagnosis not present

## 2012-07-09 DIAGNOSIS — R300 Dysuria: Secondary | ICD-10-CM | POA: Diagnosis not present

## 2012-07-09 DIAGNOSIS — E538 Deficiency of other specified B group vitamins: Secondary | ICD-10-CM | POA: Diagnosis not present

## 2012-07-12 DIAGNOSIS — J069 Acute upper respiratory infection, unspecified: Secondary | ICD-10-CM | POA: Diagnosis not present

## 2012-07-29 DIAGNOSIS — R300 Dysuria: Secondary | ICD-10-CM | POA: Diagnosis not present

## 2012-07-29 DIAGNOSIS — N3943 Post-void dribbling: Secondary | ICD-10-CM | POA: Diagnosis not present

## 2012-08-23 DIAGNOSIS — E538 Deficiency of other specified B group vitamins: Secondary | ICD-10-CM | POA: Diagnosis not present

## 2012-08-30 DIAGNOSIS — I10 Essential (primary) hypertension: Secondary | ICD-10-CM | POA: Diagnosis not present

## 2012-08-30 DIAGNOSIS — E785 Hyperlipidemia, unspecified: Secondary | ICD-10-CM | POA: Diagnosis not present

## 2012-09-15 DIAGNOSIS — R32 Unspecified urinary incontinence: Secondary | ICD-10-CM | POA: Diagnosis not present

## 2012-09-15 DIAGNOSIS — R279 Unspecified lack of coordination: Secondary | ICD-10-CM | POA: Diagnosis not present

## 2012-09-15 DIAGNOSIS — N400 Enlarged prostate without lower urinary tract symptoms: Secondary | ICD-10-CM | POA: Diagnosis not present

## 2012-09-15 DIAGNOSIS — M6281 Muscle weakness (generalized): Secondary | ICD-10-CM | POA: Diagnosis not present

## 2012-09-16 DIAGNOSIS — E538 Deficiency of other specified B group vitamins: Secondary | ICD-10-CM | POA: Diagnosis not present

## 2012-09-28 DIAGNOSIS — R32 Unspecified urinary incontinence: Secondary | ICD-10-CM | POA: Diagnosis not present

## 2012-09-28 DIAGNOSIS — M6281 Muscle weakness (generalized): Secondary | ICD-10-CM | POA: Diagnosis not present

## 2012-09-28 DIAGNOSIS — R279 Unspecified lack of coordination: Secondary | ICD-10-CM | POA: Diagnosis not present

## 2012-10-11 DIAGNOSIS — R279 Unspecified lack of coordination: Secondary | ICD-10-CM | POA: Diagnosis not present

## 2012-10-11 DIAGNOSIS — N393 Stress incontinence (female) (male): Secondary | ICD-10-CM | POA: Diagnosis not present

## 2012-10-11 DIAGNOSIS — R350 Frequency of micturition: Secondary | ICD-10-CM | POA: Diagnosis not present

## 2012-10-11 DIAGNOSIS — M6281 Muscle weakness (generalized): Secondary | ICD-10-CM | POA: Diagnosis not present

## 2012-10-14 ENCOUNTER — Ambulatory Visit (INDEPENDENT_AMBULATORY_CARE_PROVIDER_SITE_OTHER): Payer: Medicare Other | Admitting: Family Medicine

## 2012-10-14 DIAGNOSIS — D518 Other vitamin B12 deficiency anemias: Secondary | ICD-10-CM | POA: Diagnosis not present

## 2012-10-14 DIAGNOSIS — E538 Deficiency of other specified B group vitamins: Secondary | ICD-10-CM

## 2012-10-14 DIAGNOSIS — D519 Vitamin B12 deficiency anemia, unspecified: Secondary | ICD-10-CM

## 2012-10-14 MED ORDER — CYANOCOBALAMIN 1000 MCG/ML IJ SOLN: 1000.0000 ug | Freq: Once | INTRAMUSCULAR | Status: DC

## 2012-10-14 MED ORDER — CYANOCOBALAMIN 1000 MCG/ML IJ SOLN
1000.0000 ug | Freq: Once | INTRAMUSCULAR | Status: AC
  Administered 2012-10-14: 1000 ug via INTRAMUSCULAR

## 2012-10-26 DIAGNOSIS — M6281 Muscle weakness (generalized): Secondary | ICD-10-CM | POA: Diagnosis not present

## 2012-10-26 DIAGNOSIS — N393 Stress incontinence (female) (male): Secondary | ICD-10-CM | POA: Diagnosis not present

## 2012-10-26 DIAGNOSIS — R279 Unspecified lack of coordination: Secondary | ICD-10-CM | POA: Diagnosis not present

## 2012-11-10 DIAGNOSIS — R609 Edema, unspecified: Secondary | ICD-10-CM | POA: Diagnosis not present

## 2012-11-10 DIAGNOSIS — E782 Mixed hyperlipidemia: Secondary | ICD-10-CM | POA: Diagnosis not present

## 2012-11-10 DIAGNOSIS — E119 Type 2 diabetes mellitus without complications: Secondary | ICD-10-CM | POA: Diagnosis not present

## 2012-11-10 DIAGNOSIS — I119 Hypertensive heart disease without heart failure: Secondary | ICD-10-CM | POA: Diagnosis not present

## 2012-11-16 ENCOUNTER — Ambulatory Visit (INDEPENDENT_AMBULATORY_CARE_PROVIDER_SITE_OTHER): Payer: Medicare Other | Admitting: *Deleted

## 2012-11-16 DIAGNOSIS — N393 Stress incontinence (female) (male): Secondary | ICD-10-CM | POA: Diagnosis not present

## 2012-11-16 DIAGNOSIS — M6281 Muscle weakness (generalized): Secondary | ICD-10-CM | POA: Diagnosis not present

## 2012-11-16 DIAGNOSIS — E538 Deficiency of other specified B group vitamins: Secondary | ICD-10-CM

## 2012-11-16 DIAGNOSIS — R279 Unspecified lack of coordination: Secondary | ICD-10-CM | POA: Diagnosis not present

## 2012-11-18 DIAGNOSIS — E538 Deficiency of other specified B group vitamins: Secondary | ICD-10-CM

## 2012-11-18 MED ORDER — CYANOCOBALAMIN 1000 MCG/ML IJ SOLN
1000.0000 ug | Freq: Once | INTRAMUSCULAR | Status: AC
  Administered 2012-11-18: 1000 ug via INTRAMUSCULAR

## 2012-11-29 DIAGNOSIS — I10 Essential (primary) hypertension: Secondary | ICD-10-CM | POA: Diagnosis not present

## 2012-11-29 DIAGNOSIS — E559 Vitamin D deficiency, unspecified: Secondary | ICD-10-CM | POA: Diagnosis not present

## 2012-11-29 DIAGNOSIS — E785 Hyperlipidemia, unspecified: Secondary | ICD-10-CM | POA: Diagnosis not present

## 2012-12-07 DIAGNOSIS — H532 Diplopia: Secondary | ICD-10-CM | POA: Diagnosis not present

## 2012-12-07 DIAGNOSIS — IMO0002 Reserved for concepts with insufficient information to code with codable children: Secondary | ICD-10-CM | POA: Diagnosis not present

## 2012-12-07 DIAGNOSIS — E119 Type 2 diabetes mellitus without complications: Secondary | ICD-10-CM | POA: Diagnosis not present

## 2012-12-07 DIAGNOSIS — H5010 Unspecified exotropia: Secondary | ICD-10-CM | POA: Diagnosis not present

## 2012-12-09 DIAGNOSIS — R279 Unspecified lack of coordination: Secondary | ICD-10-CM | POA: Diagnosis not present

## 2012-12-09 DIAGNOSIS — N3943 Post-void dribbling: Secondary | ICD-10-CM | POA: Diagnosis not present

## 2012-12-09 DIAGNOSIS — N393 Stress incontinence (female) (male): Secondary | ICD-10-CM | POA: Diagnosis not present

## 2012-12-09 DIAGNOSIS — M6281 Muscle weakness (generalized): Secondary | ICD-10-CM | POA: Diagnosis not present

## 2012-12-16 ENCOUNTER — Ambulatory Visit (INDEPENDENT_AMBULATORY_CARE_PROVIDER_SITE_OTHER): Payer: Medicare Other | Admitting: *Deleted

## 2012-12-16 ENCOUNTER — Ambulatory Visit: Payer: Medicare Other

## 2012-12-16 DIAGNOSIS — D414 Neoplasm of uncertain behavior of bladder: Secondary | ICD-10-CM | POA: Diagnosis not present

## 2012-12-16 DIAGNOSIS — R32 Unspecified urinary incontinence: Secondary | ICD-10-CM | POA: Diagnosis not present

## 2012-12-16 DIAGNOSIS — E538 Deficiency of other specified B group vitamins: Secondary | ICD-10-CM

## 2012-12-16 MED ORDER — CYANOCOBALAMIN 1000 MCG/ML IJ SOLN
1000.0000 ug | Freq: Once | INTRAMUSCULAR | Status: AC
  Administered 2012-12-16: 1000 ug via INTRAMUSCULAR

## 2012-12-23 DIAGNOSIS — R279 Unspecified lack of coordination: Secondary | ICD-10-CM | POA: Diagnosis not present

## 2012-12-23 DIAGNOSIS — M6281 Muscle weakness (generalized): Secondary | ICD-10-CM | POA: Diagnosis not present

## 2012-12-23 DIAGNOSIS — N393 Stress incontinence (female) (male): Secondary | ICD-10-CM | POA: Diagnosis not present

## 2013-01-06 ENCOUNTER — Other Ambulatory Visit: Payer: Self-pay | Admitting: Family Medicine

## 2013-01-06 DIAGNOSIS — N393 Stress incontinence (female) (male): Secondary | ICD-10-CM | POA: Diagnosis not present

## 2013-01-06 DIAGNOSIS — R279 Unspecified lack of coordination: Secondary | ICD-10-CM | POA: Diagnosis not present

## 2013-01-06 DIAGNOSIS — M6281 Muscle weakness (generalized): Secondary | ICD-10-CM | POA: Diagnosis not present

## 2013-01-12 ENCOUNTER — Ambulatory Visit (INDEPENDENT_AMBULATORY_CARE_PROVIDER_SITE_OTHER): Payer: Medicare Other | Admitting: Family Medicine

## 2013-01-12 DIAGNOSIS — E559 Vitamin D deficiency, unspecified: Secondary | ICD-10-CM

## 2013-01-12 MED ORDER — CYANOCOBALAMIN 1000 MCG/ML IJ SOLN
1000.0000 ug | Freq: Once | INTRAMUSCULAR | Status: AC
  Administered 2013-01-12: 1000 ug via INTRAMUSCULAR

## 2013-01-20 DIAGNOSIS — R279 Unspecified lack of coordination: Secondary | ICD-10-CM | POA: Diagnosis not present

## 2013-01-20 DIAGNOSIS — M6281 Muscle weakness (generalized): Secondary | ICD-10-CM | POA: Diagnosis not present

## 2013-01-20 DIAGNOSIS — N393 Stress incontinence (female) (male): Secondary | ICD-10-CM | POA: Diagnosis not present

## 2013-02-03 ENCOUNTER — Ambulatory Visit (INDEPENDENT_AMBULATORY_CARE_PROVIDER_SITE_OTHER): Payer: Medicare Other | Admitting: *Deleted

## 2013-02-03 DIAGNOSIS — R279 Unspecified lack of coordination: Secondary | ICD-10-CM | POA: Diagnosis not present

## 2013-02-03 DIAGNOSIS — M6281 Muscle weakness (generalized): Secondary | ICD-10-CM | POA: Diagnosis not present

## 2013-02-03 DIAGNOSIS — E538 Deficiency of other specified B group vitamins: Secondary | ICD-10-CM | POA: Diagnosis not present

## 2013-02-03 DIAGNOSIS — N393 Stress incontinence (female) (male): Secondary | ICD-10-CM | POA: Diagnosis not present

## 2013-02-03 MED ORDER — CYANOCOBALAMIN 1000 MCG/ML IJ SOLN
1000.0000 ug | Freq: Once | INTRAMUSCULAR | Status: AC
  Administered 2013-02-03: 1000 ug via INTRAMUSCULAR

## 2013-02-14 ENCOUNTER — Other Ambulatory Visit: Payer: Self-pay | Admitting: Endocrinology

## 2013-02-24 DIAGNOSIS — R279 Unspecified lack of coordination: Secondary | ICD-10-CM | POA: Diagnosis not present

## 2013-02-24 DIAGNOSIS — M6281 Muscle weakness (generalized): Secondary | ICD-10-CM | POA: Diagnosis not present

## 2013-02-24 DIAGNOSIS — N393 Stress incontinence (female) (male): Secondary | ICD-10-CM | POA: Diagnosis not present

## 2013-03-01 ENCOUNTER — Ambulatory Visit (INDEPENDENT_AMBULATORY_CARE_PROVIDER_SITE_OTHER): Payer: Medicare Other | Admitting: Endocrinology

## 2013-03-01 ENCOUNTER — Other Ambulatory Visit: Payer: Self-pay | Admitting: *Deleted

## 2013-03-01 ENCOUNTER — Encounter: Payer: Self-pay | Admitting: Endocrinology

## 2013-03-01 ENCOUNTER — Other Ambulatory Visit (INDEPENDENT_AMBULATORY_CARE_PROVIDER_SITE_OTHER): Payer: Medicare Other

## 2013-03-01 ENCOUNTER — Ambulatory Visit (INDEPENDENT_AMBULATORY_CARE_PROVIDER_SITE_OTHER): Payer: Medicare Other | Admitting: Family Medicine

## 2013-03-01 VITALS — BP 124/62 | HR 60 | Temp 98.3°F | Resp 12 | Ht 69.0 in | Wt 199.4 lb

## 2013-03-01 DIAGNOSIS — E119 Type 2 diabetes mellitus without complications: Secondary | ICD-10-CM

## 2013-03-01 DIAGNOSIS — E7800 Pure hypercholesterolemia, unspecified: Secondary | ICD-10-CM

## 2013-03-01 DIAGNOSIS — E538 Deficiency of other specified B group vitamins: Secondary | ICD-10-CM

## 2013-03-01 LAB — COMPREHENSIVE METABOLIC PANEL
ALT: 20 U/L (ref 0–53)
AST: 20 U/L (ref 0–37)
Albumin: 4 g/dL (ref 3.5–5.2)
BUN: 28 mg/dL — ABNORMAL HIGH (ref 6–23)
CO2: 27 mEq/L (ref 19–32)
Calcium: 9.3 mg/dL (ref 8.4–10.5)
Chloride: 103 mEq/L (ref 96–112)
GFR: 53.34 mL/min — ABNORMAL LOW (ref >60.00)
Potassium: 4.4 mEq/L (ref 3.5–5.1)

## 2013-03-01 LAB — URINALYSIS
Hgb urine dipstick: NEGATIVE
Ketones, ur: NEGATIVE
Urine Glucose: NEGATIVE
Urobilinogen, UA: 0.2 (ref 0.0–1.0)

## 2013-03-01 MED ORDER — CYANOCOBALAMIN 1000 MCG/ML IJ SOLN
1000.0000 ug | Freq: Once | INTRAMUSCULAR | Status: AC
  Administered 2013-03-01: 1000 ug via INTRAMUSCULAR

## 2013-03-01 NOTE — Patient Instructions (Addendum)
Please check blood sugars at least half the time about 2 hours after any meal and as directed on waking up. Please bring blood sugar monitor to each visit  If sugar is over 170 after supper go up 1-2 u nits for supper Novolog

## 2013-03-01 NOTE — Progress Notes (Signed)
Patient ID: [name], male   DOB: 25-Nov-1928, 79 y.o.   MRN: [id number]  [name] is an 79 y.o. male.   Reason for Appointment: Diabetes follow-up   History of Present Illness   Diagnosis: Type 2 DIABETES MELITUS, long-standing     Oral hypoglycemic drugs: Metformin, 1500 mg and Amaryl        Side effects from medications: None Insulin regimen: Levemir 6 units at night, NovoLog 4-5 a.c.           Proper timing of medications in relation to meals: Yes.         Monitors blood glucose: Once a day.    Glucometer: One Touch.          Blood Glucose readings from meter download: readings before breakfast: 117-209 with median 124, suppertime 114-138, no readings after supper  Hypoglycemia frequency: Never.          Meals: 3 meals per day.          Physical activity: exercise: Some walking and gardening           Complications: are: Minimal     He has not been monitoring his blood sugars much recently and only some in the morning. His readings in the mornings are quite variable but has not checked consistently He does not know why some morning readings are higher possibly from inconsistent with evening meal and snacks Also has not been exercising on his treadmill his usual as this is not working Overall blood sugars are still reasonably controlled for his age and he has no hypoglycemia recently He has been asked to adjust his evening meal timing dosage based on meal size and postprandial readings. May take an extra unit if eating a larger meal at supper     Medication List       This list is accurate as of: 03/01/13 10:12 AM.  Always use your most recent med list.               aspirin 81 MG tablet  Take 81 mg by mouth daily.     atenolol 25 MG tablet  Commonly known as:  TENORMIN  Take 12.5-25 mg by mouth daily. 1 in the morning and one-half at bedtime     cholecalciferol 1000 UNITS tablet  Commonly known as:  VITAMIN D  Take 1,000 Units by mouth daily.     docusate sodium 100 MG capsule  Commonly known as:  COLACE  Take 100 mg by mouth 2 (two) times daily.     ferrous gluconate 325 MG tablet  Commonly known as:  FERGON  Take 325 mg by mouth daily with breakfast.     glimepiride 4 MG tablet  Commonly known as:  AMARYL  Take 4 mg by mouth daily.     hydrochlorothiazide 25 MG tablet  Commonly known as:  HYDRODIURIL  Take 12.5 mg by mouth daily.     hyoscyamine 0.125 MG tablet  Commonly known as:  LEVSIN, ANASPAZ  Take 1 tablet (0.125 mg total) by mouth every 4 (four) hours as needed for cramping (bladder spasms).     insulin aspart 100 UNIT/ML injection  Commonly known as:  novoLOG  Inject 6 Units into the skin 3 (three) times daily before meals.     LEVEMIR FLEXPEN 100 UNIT/ML Sopn  Generic drug:  Insulin Detemir  TAKE 5 UNITS SUBCUTANEOUS AT BEDTIME     insulin detemir 100 UNIT/ML injection  Commonly known as:  LEVEMIR  Inject 6 Units into the skin at bedtime.     loratadine 10 MG tablet  Commonly known as:  CLARITIN  Take 10 mg by mouth daily.     metFORMIN 1000 MG tablet  Commonly known as:  GLUCOPHAGE  Take 1,000 mg by mouth 2 (two) times daily with a meal. Take one tablet in am and 1/2 tablet at bedtime     neomycin-bacitracin-polymyxin ointment  Commonly known as:  NEOSPORIN  Apply 1 application topically 3 (three) times daily as needed (catheter irritation). apply to eye     omeprazole 20 MG capsule  Commonly known as:  PRILOSEC  Take 20 mg by mouth daily.     oxybutynin 10 MG 24 hr tablet  Commonly known as:  DITROPAN-XL     oxyCODONE 5 MG immediate release tablet  Commonly known as:  Oxy IR/ROXICODONE  Take 1-2 tablets (5-10 mg total) by mouth every 4 (four) hours as needed for pain.     simvastatin 80 MG tablet  Commonly known as:  ZOCOR  Take 40 mg by mouth at bedtime.     tamsulosin 0.4 MG Caps capsule  Commonly known as:  FLOMAX  Take 0.4 mg by mouth daily after supper.     trandolapril 1 MG  tablet  Commonly known as:  MAVIK  TAKE 1 TABLET EVERY DAY     VITAMIN B-12 IJ  Inject 1,000 mcg as directed every 21 ( twenty-one) days.        Allergies:  Allergies  Allergen Reactions  . Avodart (Dutasteride) Swelling  . Ibuprofen Nausea And Vomiting  . Morphine Nausea And Vomiting    Past Medical History  Diagnosis Date  . Diabetes mellitus   . Colon polyps   . BPH (benign prostatic hypertrophy)   . Osteoarthritis   . History of kidney stones     Past Surgical History  Procedure Laterality Date  . Appendectomy    . Partial amputaion left foot    . Knee cartilage surgery      Arthroscope  . Transurethral resection of prostate  06/17/2012    Procedure: TRANSURETHRAL RESECTION OF THE PROSTATE WITH GYRUS INSTRUMENTS;  Surgeon: Milford Cage, MD;  Location: [facility];  Service: Urology;  Laterality: N/A;  . Cystoscopy with biopsy  06/17/2012    Procedure: CYSTOSCOPY WITH BIOPSY;  Surgeon: Milford Cage, MD;  Location: [facility];  Service: Urology;  Laterality: N/A;    No family history on file.  Social History:  reports that he has quit smoking. He does not have any smokeless tobacco history on file. His alcohol and drug histories are not on file.  Review of Systems:  HYPERTENSION:  Home range 120-130  HYPERLIPIDEMIA: The lipid abnormality consists of elevated LDL.     Examination:   BP 124/62  Pulse 60  Temp(Src) 98.3 F (36.8 C)  Resp 12  Ht 5\' 9"  (1.753 m)  Wt 199 lb 6.4 oz (90.447 kg)  BMI 29.43 kg/m2  SpO2 97%  Body mass index is 29.43 kg/(m^2).   ASSESSMENT/ PLAN::   Diabetes type 2 requiring insulin  The patient's diabetes control appears to be overall fairly good with usual variability. He is not checking his blood sugars enough to identify the reasons for periodic hyperglycemia in the mornings Levemir and metformin have been previously reduced because of nocturnal hypoglycemia and mildly reduced renal function Most likely he needs  better coverage of his evening meal especially if eating more carbohydrates or  fats and this may help overnight hyperglycemia Discussed needing to check more readings after supper and to keep them at least below 170, about 2 hours p.c.  Also he will be resuming his exercise which he has not been doing much   [name] 03/01/2013, 10:12 AM   Addendum A1c reasonably good at 7.4, p.c. breakfast reading high  Appointment on 03/01/2013  Component Date Value Range Status  . Hemoglobin A1C 03/01/2013 7.4* 4.6 - 6.5 % Final   Glycemic Control Guidelines for People with Diabetes:Non Diabetic:  <6%Goal of Therapy: <7%Additional Action Suggested:  >8%   . Sodium 03/01/2013 139  135 - 145 mEq/L Final  . Potassium 03/01/2013 4.4  3.5 - 5.1 mEq/L Final  . Chloride 03/01/2013 103  96 - 112 mEq/L Final  . CO2 03/01/2013 27  19 - 32 mEq/L Final  . Glucose, Bld 03/01/2013 227* 70 - 99 mg/dL Final  . BUN 16/04/9603 28* 6 - 23 mg/dL Final  . Creatinine, Ser 03/01/2013 1.4  0.4 - 1.5 mg/dL Final  . Total Bilirubin 03/01/2013 0.6  0.3 - 1.2 mg/dL Final  . Alkaline Phosphatase 03/01/2013 70  39 - 117 U/L Final  . AST 03/01/2013 20  0 - 37 U/L Final  . ALT 03/01/2013 20  0 - 53 U/L Final  . Total Protein 03/01/2013 6.8  6.0 - 8.3 g/dL Final  . Albumin 54/03/8118 4.0  3.5 - 5.2 g/dL Final  . Calcium 14/78/2956 9.3  8.4 - 10.5 mg/dL Final  . GFR 21/30/8657 53.34* >60.00 mL/min Final  . Color, Urine 03/01/2013 LT. YELLOW  Yellow;Lt. Yellow Final  . APPearance 03/01/2013 CLEAR  Clear Final  . Specific Gravity, Urine 03/01/2013 >=1.030  1.000 - 1.030 Final  . pH 03/01/2013 5.5  5.0 - 8.0 Final  . Total Protein, Urine 03/01/2013 NEGATIVE  Negative Final  . Urine Glucose 03/01/2013 NEGATIVE  Negative Final  . Ketones, ur 03/01/2013 NEGATIVE  Negative Final  . Bilirubin Urine 03/01/2013 NEGATIVE  Negative Final  . Hgb urine dipstick 03/01/2013 NEGATIVE  Negative Final  . Urobilinogen, UA 03/01/2013 0.2  0.0 -  1.0 Final  . Leukocytes, UA 03/01/2013 NEGATIVE  Negative Final  . Nitrite 03/01/2013 NEGATIVE  Negative Final  . Microalb, Ur 03/01/2013 0.6  0.0 - 1.9 mg/dL Final  . Creatinine,U 84/69/6295 160.7   Final  . Microalb Creat Ratio 03/01/2013 0.4  0.0 - 30.0 mg/g Final

## 2013-03-02 ENCOUNTER — Other Ambulatory Visit: Payer: Self-pay | Admitting: *Deleted

## 2013-03-03 ENCOUNTER — Telehealth: Payer: Self-pay | Admitting: *Deleted

## 2013-03-03 NOTE — Telephone Encounter (Signed)
Message copied by Hermenia Bers on Thu Mar 03, 2013  3:21 PM ------      Message from: Reather Littler      Created: Wed Mar 02, 2013 11:55 AM       A1c is reasonably good at 7.4, remind him to check more readings after meals ------

## 2013-03-03 NOTE — Telephone Encounter (Signed)
Noted pt is aware 

## 2013-03-03 NOTE — Telephone Encounter (Signed)
Message copied by Hermenia Bers on Thu Mar 03, 2013  3:23 PM ------      Message from: Reather Littler      Created: Wed Mar 02, 2013 11:55 AM       A1c is reasonably good at 7.4, remind him to check more readings after meals ------

## 2013-03-14 ENCOUNTER — Encounter: Payer: Self-pay | Admitting: Family Medicine

## 2013-03-14 ENCOUNTER — Ambulatory Visit (INDEPENDENT_AMBULATORY_CARE_PROVIDER_SITE_OTHER): Payer: Medicare Other | Admitting: Family Medicine

## 2013-03-14 VITALS — BP 110/54 | HR 58 | Temp 98.3°F | Resp 14 | Ht 69.5 in | Wt 198.0 lb

## 2013-03-14 DIAGNOSIS — I10 Essential (primary) hypertension: Secondary | ICD-10-CM | POA: Insufficient documentation

## 2013-03-14 DIAGNOSIS — Z0000 Encounter for general adult medical examination without abnormal findings: Secondary | ICD-10-CM

## 2013-03-14 DIAGNOSIS — E785 Hyperlipidemia, unspecified: Secondary | ICD-10-CM | POA: Insufficient documentation

## 2013-03-14 DIAGNOSIS — E538 Deficiency of other specified B group vitamins: Secondary | ICD-10-CM | POA: Insufficient documentation

## 2013-03-14 DIAGNOSIS — E119 Type 2 diabetes mellitus without complications: Secondary | ICD-10-CM

## 2013-03-14 NOTE — Progress Notes (Signed)
Subjective:    Patient ID: [name], male    DOB: [birth year], 79 y.o.   MRN: [id number]  HPI Patient is an 79 year old white male here today for a complete physical exam. His last colonoscopy was in 2010. Per the patient there was no evidence of any polyps. He is not due again until 2020. Based on his age he will not require another colonoscopy unless symptomatic. He had the pneumonia vaccine in 2000. He had the shingles vaccine in 2008. His last tetanus shot was given in 2008. His prostate is checked by urology. His most recent lab work was checked at endocrinology by Dr. Lucianne Muss. Appointment on 03/01/2013  Component Date Value Range Status  . Hemoglobin A1C 03/01/2013 7.4* 4.6 - 6.5 % Final   Glycemic Control Guidelines for People with Diabetes:Non Diabetic:  <6%Goal of Therapy: <7%Additional Action Suggested:  >8%   . Sodium 03/01/2013 139  135 - 145 mEq/L Final  . Potassium 03/01/2013 4.4  3.5 - 5.1 mEq/L Final  . Chloride 03/01/2013 103  96 - 112 mEq/L Final  . CO2 03/01/2013 27  19 - 32 mEq/L Final  . Glucose, Bld 03/01/2013 227* 70 - 99 mg/dL Final  . BUN 40/98/1191 28* 6 - 23 mg/dL Final  . Creatinine, Ser 03/01/2013 1.4  0.4 - 1.5 mg/dL Final  . Total Bilirubin 03/01/2013 0.6  0.3 - 1.2 mg/dL Final  . Alkaline Phosphatase 03/01/2013 70  39 - 117 U/L Final  . AST 03/01/2013 20  0 - 37 U/L Final  . ALT 03/01/2013 20  0 - 53 U/L Final  . Total Protein 03/01/2013 6.8  6.0 - 8.3 g/dL Final  . Albumin 47/82/9562 4.0  3.5 - 5.2 g/dL Final  . Calcium 13/02/6577 9.3  8.4 - 10.5 mg/dL Final  . GFR 46/96/2952 53.34* >60.00 mL/min Final  . Color, Urine 03/01/2013 LT. YELLOW  Yellow;Lt. Yellow Final  . APPearance 03/01/2013 CLEAR  Clear Final  . Specific Gravity, Urine 03/01/2013 >=1.030  1.000 - 1.030 Final  . pH 03/01/2013 5.5  5.0 - 8.0 Final  . Total Protein, Urine 03/01/2013 NEGATIVE  Negative Final  . Urine Glucose 03/01/2013 NEGATIVE  Negative Final  . Ketones, ur 03/01/2013  NEGATIVE  Negative Final  . Bilirubin Urine 03/01/2013 NEGATIVE  Negative Final  . Hgb urine dipstick 03/01/2013 NEGATIVE  Negative Final  . Urobilinogen, UA 03/01/2013 0.2  0.0 - 1.0 Final  . Leukocytes, UA 03/01/2013 NEGATIVE  Negative Final  . Nitrite 03/01/2013 NEGATIVE  Negative Final  . Microalb, Ur 03/01/2013 0.6  0.0 - 1.9 mg/dL Final  . Creatinine,U 84/13/2440 160.7   Final  . Microalb Creat Ratio 03/01/2013 0.4  0.0 - 30.0 mg/g Final   his hemoglobin A1c was slightly elevated at 7.4. His blood sugar was slightly elevated. However given his advanced age, I believe a hemoglobin A1c of 7.4 is reasonably acceptable. His creatinine was 1.4 indicating mild kidney damage and stage III chronic kidney disease. Other than that the patient is completely asymptomatic. He is overdue for a lipid panel as well as a CBC to monitor his B12 deficiency. Past Medical History  Diagnosis Date  . Diabetes mellitus   . Colon polyps   . BPH (benign prostatic hypertrophy)   . Osteoarthritis   . History of kidney stones   . Hypertension   . Hyperlipidemia   . B12 deficiency   . GERD (gastroesophageal reflux disease)     hiatal hernia   Past Surgical History  Procedure Laterality Date  . Appendectomy    . Partial amputaion left foot    . Knee cartilage surgery      Arthroscope  . Transurethral resection of prostate  06/17/2012    Procedure: TRANSURETHRAL RESECTION OF THE PROSTATE WITH GYRUS INSTRUMENTS;  Surgeon: Milford Cage, MD;  Location: [facility];  Service: Urology;  Laterality: N/A;  . Cystoscopy with biopsy  06/17/2012    Procedure: CYSTOSCOPY WITH BIOPSY;  Surgeon: Milford Cage, MD;  Location: [facility];  Service: Urology;  Laterality: N/A;   Current Outpatient Prescriptions on File Prior to Visit  Medication Sig Dispense Refill  . aspirin 81 MG tablet Take 81 mg by mouth daily.      Marland Kitchen atenolol (TENORMIN) 25 MG tablet Take 12.5-25 mg by mouth daily. 1 in the morning and  one-half at bedtime      . cholecalciferol (VITAMIN D) 1000 UNITS tablet Take 1,000 Units by mouth daily.      . Cyanocobalamin (VITAMIN B-12 IJ) Inject 1,000 mcg as directed every 21 ( twenty-one) days.        Marland Kitchen docusate sodium (COLACE) 100 MG capsule Take 100 mg by mouth 2 (two) times daily.      . ferrous gluconate (FERGON) 325 MG tablet Take 325 mg by mouth daily with breakfast.      . glimepiride (AMARYL) 4 MG tablet Take 4 mg by mouth daily.       . hydrochlorothiazide 25 MG tablet Take 12.5 mg by mouth daily.       . hyoscyamine (LEVSIN, ANASPAZ) 0.125 MG tablet Take 1 tablet (0.125 mg total) by mouth every 4 (four) hours as needed for cramping (bladder spasms).  40 tablet  4  . insulin aspart (NOVOLOG) 100 UNIT/ML injection Inject 4-5 Units into the skin 3 (three) times daily before meals.       . insulin detemir (LEVEMIR) 100 UNIT/ML injection Inject 6 Units into the skin at bedtime.       Marland Kitchen LEVEMIR FLEXPEN 100 UNIT/ML SOPN TAKE 5 UNITS SUBCUTANEOUS AT BEDTIME  5 pen  1  . loratadine (CLARITIN) 10 MG tablet Take 10 mg by mouth daily.        . metFORMIN (GLUCOPHAGE) 1000 MG tablet Take 1,000 mg by mouth 2 (two) times daily with a meal. Take one tablet in am and 1/2 tablet at bedtime      . omeprazole (PRILOSEC) 20 MG capsule Take 20 mg by mouth daily.        Marland Kitchen oxybutynin (DITROPAN-XL) 10 MG 24 hr tablet       . oxyCODONE (OXY IR/ROXICODONE) 5 MG immediate release tablet Take 1-2 tablets (5-10 mg total) by mouth every 4 (four) hours as needed for pain.  40 tablet  0  . simvastatin (ZOCOR) 80 MG tablet Take 40 mg by mouth at bedtime.      . Tamsulosin HCl (FLOMAX) 0.4 MG CAPS Take 0.4 mg by mouth daily after supper.      . trandolapril (MAVIK) 1 MG tablet TAKE 1 TABLET EVERY DAY  90 tablet  3   No current facility-administered medications on file prior to visit.   Allergies  Allergen Reactions  . Avodart [Dutasteride] Swelling  . Ibuprofen Nausea And Vomiting  . Morphine Nausea And  Vomiting   History   Social History  . Marital Status: Married    Spouse Name: N/A    Number of Children: N/A  . Years of Education: N/A  Occupational History  . Not on file.   Social History Main Topics  . Smoking status: Former Games developer  . Smokeless tobacco: Not on file  . Alcohol Use: No  . Drug Use: No  . Sexual Activity: Not on file     Comment: married to [place], retired.   Other Topics Concern  . Not on file   Social History Narrative  . No narrative on file   History reviewed. No pertinent family history.    Review of Systems  All other systems reviewed and are negative.       Objective:   Physical Exam  Vitals reviewed. Constitutional: He is oriented to person, place, and time. He appears well-developed and well-nourished. No distress.  HENT:  Head: Normocephalic and atraumatic.  Right Ear: External ear normal.  Left Ear: External ear normal.  Nose: Nose normal.  Mouth/Throat: Oropharynx is clear and moist. No oropharyngeal exudate.  Eyes: Conjunctivae and EOM are normal. Pupils are equal, round, and reactive to light. Right eye exhibits no discharge. Left eye exhibits no discharge. No scleral icterus.  Neck: Normal range of motion. Neck supple. No JVD present. No tracheal deviation present. No thyromegaly present.  Cardiovascular: Normal rate, regular rhythm, normal heart sounds and intact distal pulses.  Exam reveals no gallop and no friction rub.   No murmur heard. Pulmonary/Chest: Effort normal and breath sounds normal. No stridor. No respiratory distress. He has no wheezes. He has no rales. He exhibits no tenderness.  Abdominal: Soft. Bowel sounds are normal. He exhibits no distension and no mass. There is no tenderness. There is no rebound and no guarding.  Musculoskeletal: Normal range of motion. He exhibits no edema.  Lymphadenopathy:    He has no cervical adenopathy.  Neurological: He is alert and oriented to person, place, and time. He has  normal reflexes. He displays normal reflexes. No cranial nerve deficit. He exhibits normal muscle tone. Coordination normal.  Skin: Skin is warm and dry. No rash noted. He is not diaphoretic. No erythema. No pallor.  Psychiatric: He has a normal mood and affect. His behavior is normal. Judgment and thought content normal.          Assessment & Plan:  1. Routine general medical examination at a health care facility Physical exam today is completely normal. I have patient come back fasting for a lipid panel as well as a CBC. Otherwise his cancer screening is up to date. His PSA is checked by his urologist who he sees in the fall. His colonoscopy is up to date.  His blood pressure is well controlled. His diabetes is adequately controlled. His goal LDL would be less than 100. I will wait results of his fasting lipid panel prior to making any medication changes. His immunizations are up-to-date. I did recommend a flu shot this fall. I did recommend he get an annual eye exam.  Currently he is full  Code.  2. Type II or unspecified type diabetes mellitus without mention of complication, not stated as uncontrolled  - Lipid Panel; Future

## 2013-03-15 ENCOUNTER — Other Ambulatory Visit: Payer: Medicare Other

## 2013-03-15 DIAGNOSIS — E119 Type 2 diabetes mellitus without complications: Secondary | ICD-10-CM | POA: Diagnosis not present

## 2013-03-15 LAB — LIPID PANEL
Cholesterol: 110 mg/dL (ref 0–200)
Total CHOL/HDL Ratio: 2.6 Ratio
VLDL: 11 mg/dL (ref 0–40)

## 2013-03-17 DIAGNOSIS — R279 Unspecified lack of coordination: Secondary | ICD-10-CM | POA: Diagnosis not present

## 2013-03-17 DIAGNOSIS — M6281 Muscle weakness (generalized): Secondary | ICD-10-CM | POA: Diagnosis not present

## 2013-03-17 DIAGNOSIS — N393 Stress incontinence (female) (male): Secondary | ICD-10-CM | POA: Diagnosis not present

## 2013-04-18 ENCOUNTER — Ambulatory Visit (INDEPENDENT_AMBULATORY_CARE_PROVIDER_SITE_OTHER): Payer: Medicare Other | Admitting: Family Medicine

## 2013-04-18 DIAGNOSIS — E538 Deficiency of other specified B group vitamins: Secondary | ICD-10-CM | POA: Diagnosis not present

## 2013-04-18 MED ORDER — CYANOCOBALAMIN 1000 MCG/ML IJ SOLN
1000.0000 ug | Freq: Once | INTRAMUSCULAR | Status: AC
  Administered 2013-04-18: 1000 ug via INTRAMUSCULAR

## 2013-04-20 DIAGNOSIS — N393 Stress incontinence (female) (male): Secondary | ICD-10-CM | POA: Diagnosis not present

## 2013-04-20 DIAGNOSIS — R279 Unspecified lack of coordination: Secondary | ICD-10-CM | POA: Diagnosis not present

## 2013-04-20 DIAGNOSIS — M6281 Muscle weakness (generalized): Secondary | ICD-10-CM | POA: Diagnosis not present

## 2013-05-03 ENCOUNTER — Ambulatory Visit (INDEPENDENT_AMBULATORY_CARE_PROVIDER_SITE_OTHER): Payer: Medicare Other | Admitting: Family Medicine

## 2013-05-03 DIAGNOSIS — Z23 Encounter for immunization: Secondary | ICD-10-CM

## 2013-05-20 ENCOUNTER — Ambulatory Visit (INDEPENDENT_AMBULATORY_CARE_PROVIDER_SITE_OTHER): Payer: Medicare Other | Admitting: Family Medicine

## 2013-05-20 DIAGNOSIS — E538 Deficiency of other specified B group vitamins: Secondary | ICD-10-CM | POA: Diagnosis not present

## 2013-05-20 MED ORDER — CYANOCOBALAMIN 1000 MCG/ML IJ SOLN
1000.0000 ug | Freq: Once | INTRAMUSCULAR | Status: AC
  Administered 2013-05-20: 1000 ug via INTRAMUSCULAR

## 2013-05-31 ENCOUNTER — Encounter: Payer: Self-pay | Admitting: Endocrinology

## 2013-05-31 ENCOUNTER — Ambulatory Visit: Payer: Medicare Other | Admitting: Internal Medicine

## 2013-05-31 ENCOUNTER — Ambulatory Visit (INDEPENDENT_AMBULATORY_CARE_PROVIDER_SITE_OTHER): Payer: Medicare Other | Admitting: Endocrinology

## 2013-05-31 VITALS — BP 130/56 | HR 60 | Temp 98.5°F | Resp 12 | Ht 69.5 in | Wt 203.0 lb

## 2013-05-31 DIAGNOSIS — E7800 Pure hypercholesterolemia, unspecified: Secondary | ICD-10-CM

## 2013-05-31 LAB — LIPID PANEL
LDL Cholesterol: 62 mg/dL (ref 0–99)
Total CHOL/HDL Ratio: 2
VLDL: 13.8 mg/dL (ref 0.0–40.0)

## 2013-05-31 LAB — COMPREHENSIVE METABOLIC PANEL
ALT: 19 U/L (ref 0–53)
Albumin: 3.8 g/dL (ref 3.5–5.2)
Alkaline Phosphatase: 67 U/L (ref 39–117)
Potassium: 4.2 mEq/L (ref 3.5–5.1)
Sodium: 141 mEq/L (ref 135–145)
Total Bilirubin: 0.4 mg/dL (ref 0.3–1.2)
Total Protein: 6.6 g/dL (ref 6.0–8.3)

## 2013-05-31 LAB — HEMOGLOBIN A1C: Hgb A1c MFr Bld: 7.8 % — ABNORMAL HIGH (ref 4.6–6.5)

## 2013-05-31 NOTE — Patient Instructions (Signed)
Please check blood sugars at least half the time about 2 hours after any meal and as directed on waking up. Please bring blood sugar monitor to each visit  CHANGE LEVEMIR to 7 units in am and none at night  NOVOLOG 5-6 AT BFST, 4-5 AT LUNCH AND SUPPER

## 2013-05-31 NOTE — Progress Notes (Signed)
Patient ID: [name], male   DOB: [birth year], 79 y.o.   MRN: [id number]  [name] is an 79 y.o. male.   Reason for Appointment: Diabetes follow-up   History of Present Illness   Diagnosis: Type 2 DIABETES MELITUS, diagnoses 1972   He has had long-standing diabetes and has been on basal bolus insulin regimen after failure of oral hypoglycemic drugs over the last several years His blood sugars have been generally fairly well controlled but has a tendency to high postprandial readings  RECENT history: He has been checking blood sugars more often than on the last visit His blood sugars appear to be overall higher especially in the evening. Again is not checking blood sugars after meals as directed and mostly before breakfast and supper  He has been  instructed to adjust his evening meal timing dosage based on meal size and postprandial readings. May take an extra unit if eating a larger meal at supper    Oral hypoglycemic drugs: Metformin, 1500 mg and Amaryl        Side effects from medications: None Insulin regimen: Levemir 5 units at night, NovoLog 4-5 a.c before meals           Proper timing of medications in relation to meals: Yes.         Monitors blood glucose: Once a day.    Glucometer: One Touch.          Blood Glucose readings from meter download: readings before breakfast: 122-172, p.c. breakfast 270, around 6 PM 126-284, late evening 209 Overall median 162, highest readings early evening  Hypoglycemia frequency:  none .          Meals: 3 meals per day. Supper at 6 pm; breakfast is oatmeal with eggs          Physical activity: exercise: Some walking and gardening           Complications are: Minimal        Medication List       This list is accurate as of: 05/31/13 11:59 PM.  Always use your most recent med list.               aspirin 81 MG tablet  Take 81 mg by mouth daily.     atenolol 25 MG tablet  Commonly known as:  TENORMIN  Take 12.5-25 mg by  mouth daily. 1 in the morning and one-half at bedtime     cholecalciferol 1000 UNITS tablet  Commonly known as:  VITAMIN D  Take 1,000 Units by mouth daily.     docusate sodium 100 MG capsule  Commonly known as:  COLACE  Take 100 mg by mouth 2 (two) times daily.     ferrous gluconate 325 MG tablet  Commonly known as:  FERGON  Take 325 mg by mouth daily with breakfast.     glimepiride 4 MG tablet  Commonly known as:  AMARYL  Take 4 mg by mouth daily.     hydrochlorothiazide 25 MG tablet  Commonly known as:  HYDRODIURIL  Take 12.5 mg by mouth daily.     hyoscyamine 0.125 MG tablet  Commonly known as:  LEVSIN, ANASPAZ  Take 1 tablet (0.125 mg total) by mouth every 4 (four) hours as needed for cramping (bladder spasms).     insulin aspart 100 UNIT/ML injection  Commonly known as:  novoLOG  Inject 4-5 Units into the skin 3 (three) times daily before meals.  LEVEMIR FLEXPEN 100 UNIT/ML Sopn  Generic drug:  Insulin Detemir  TAKE 5 UNITS SUBCUTANEOUS AT BEDTIME     insulin detemir 100 UNIT/ML injection  Commonly known as:  LEVEMIR  Inject 6 Units into the skin at bedtime.     loratadine 10 MG tablet  Commonly known as:  CLARITIN  Take 10 mg by mouth daily.     metFORMIN 1000 MG tablet  Commonly known as:  GLUCOPHAGE  Take 1,000 mg by mouth 2 (two) times daily with a meal. Take one tablet in am and 1/2 tablet at bedtime     omeprazole 20 MG capsule  Commonly known as:  PRILOSEC  Take 20 mg by mouth daily.     oxybutynin 10 MG 24 hr tablet  Commonly known as:  DITROPAN-XL     oxyCODONE 5 MG immediate release tablet  Commonly known as:  Oxy IR/ROXICODONE  Take 1-2 tablets (5-10 mg total) by mouth every 4 (four) hours as needed for pain.     simvastatin 80 MG tablet  Commonly known as:  ZOCOR  Take 40 mg by mouth at bedtime.     tamsulosin 0.4 MG Caps capsule  Commonly known as:  FLOMAX  Take 0.4 mg by mouth daily after supper.     trandolapril 1 MG tablet   Commonly known as:  MAVIK  TAKE 1 TABLET EVERY DAY     VITAMIN B-12 IJ  Inject 1,000 mcg as directed every 21 ( twenty-one) days.        Allergies:  Allergies  Allergen Reactions  . Avodart [Dutasteride] Swelling  . Ibuprofen Nausea And Vomiting  . Morphine Nausea And Vomiting    Past Medical History  Diagnosis Date  . Diabetes mellitus   . Colon polyps   . BPH (benign prostatic hypertrophy)   . Osteoarthritis   . History of kidney stones   . Hypertension   . Hyperlipidemia   . B12 deficiency   . GERD (gastroesophageal reflux disease)     hiatal hernia    Past Surgical History  Procedure Laterality Date  . Appendectomy    . Partial amputaion left foot    . Knee cartilage surgery      Arthroscope  . Transurethral resection of prostate  06/17/2012    Procedure: TRANSURETHRAL RESECTION OF THE PROSTATE WITH GYRUS INSTRUMENTS;  Surgeon: Milford Cage, MD;  Location: [facility];  Service: Urology;  Laterality: N/A;  . Cystoscopy with biopsy  06/17/2012    Procedure: CYSTOSCOPY WITH BIOPSY;  Surgeon: Milford Cage, MD;  Location: [facility];  Service: Urology;  Laterality: N/A;    No family history on file.  Social History:  reports that he has quit smoking. He does not have any smokeless tobacco history on file. He reports that he does not drink alcohol or use illicit drugs.  Review of Systems:  HYPERTENSION:  Home range  systolic BP = 120-130  HYPERLIPIDEMIA: The lipid abnormality consists of elevated LDL Treated with high-dose simvastatin      Examination:   BP 130/56  Pulse 60  Temp(Src) 98.5 F (36.9 C)  Resp 12  Ht 5' 9.5" (1.765 m)  Wt 203 lb (92.08 kg)  BMI 29.56 kg/m2  SpO2 95%  Body mass index is 29.56 kg/(m^2).   ASSESSMENT/ PLAN::   1. Diabetes type 2 requiring insulin  The patient's diabetes control appears to be  somewhat worse with relatively higher readings in the evenings and periodically in the mornings also He  has only rare  blood sugars after meals but these appear to be over 200 Also cannot explain some readings over 200 at suppertime  Blood sugars may be relatively higher because of producing metformin for reduced renal function  Plan: Since his readings are higher at suppertime than in the morning will change his Levemir the morning instead of bedtime Also will change the dose to 7 units He will increase his NovoLog by 1-2 units at breakfast since readings are usually high midmorning  He was check more readings after supper and was instructed to increase the dose 1-2 units if postprandial readings continue to be high  Discussed needing to check more readings after supper and to keep them at least below 170, about 2 hours p.c.  Given instructions for the changes and his wife will help him reinforce these He was asked to keep a record of his postprandial readings, insulin doses and food intake for a couple of weeks for review He will followup with nurse educator and review his management  MILD chronic kidney disease: Creatinine to be checked again  HYPERCHOLESTEROLEMIA: Lipids to be checked  Counseling time over 50% of today's 25 minute visit  Devereaux Grayson 06/02/2013, 8:47 PM   Addendum: A1c relatively higher at 7.8 Creatinine 1.4, lipids excellent  Office Visit on 05/31/2013  Component Date Value Range Status  . Hemoglobin A1C 05/31/2013 7.8* 4.6 - 6.5 % Final   Glycemic Control Guidelines for People with Diabetes:Non Diabetic:  <6%Goal of Therapy: <7%Additional Action Suggested:  >8%   . Sodium 05/31/2013 141  135 - 145 mEq/L Final  . Potassium 05/31/2013 4.2  3.5 - 5.1 mEq/L Final  . Chloride 05/31/2013 107  96 - 112 mEq/L Final  . CO2 05/31/2013 28  19 - 32 mEq/L Final  . Glucose, Bld 05/31/2013 97  70 - 99 mg/dL Final  . BUN 81/19/1478 25* 6 - 23 mg/dL Final  . Creatinine, Ser 05/31/2013 1.4  0.4 - 1.5 mg/dL Final  . Total Bilirubin 05/31/2013 0.4  0.3 - 1.2 mg/dL Final  . Alkaline Phosphatase  05/31/2013 67  39 - 117 U/L Final  . AST 05/31/2013 18  0 - 37 U/L Final  . ALT 05/31/2013 19  0 - 53 U/L Final  . Total Protein 05/31/2013 6.6  6.0 - 8.3 g/dL Final  . Albumin 29/56/2130 3.8  3.5 - 5.2 g/dL Final  . Calcium 86/57/8469 8.9  8.4 - 10.5 mg/dL Final  . GFR 62/95/2841 52.42* >60.00 mL/min Final  . Cholesterol 05/31/2013 127  0 - 200 mg/dL Final   ATP III Classification       Desirable:  < 200 mg/dL               Borderline High:  200 - 239 mg/dL          High:  > = 324 mg/dL  . Triglycerides 05/31/2013 69.0  0.0 - 149.0 mg/dL Final   Normal:  <401 mg/dLBorderline High:  150 - 199 mg/dL  . HDL 05/31/2013 51.60  >39.00 mg/dL Final  . VLDL 02/72/5366 13.8  0.0 - 40.0 mg/dL Final  . LDL Cholesterol 05/31/2013 62  0 - 99 mg/dL Final  . Total CHOL/HDL Ratio 05/31/2013 2   Final                  Men          Women1/2 Average Risk     3.4  3.3Average Risk          5.0          4.42X Average Risk          9.6          7.13X Average Risk          15.0          11.0

## 2013-06-10 ENCOUNTER — Other Ambulatory Visit: Payer: Self-pay | Admitting: *Deleted

## 2013-06-10 MED ORDER — INSULIN ASPART 100 UNIT/ML ~~LOC~~ SOLN: 4.0000 [IU] | Freq: Three times a day (TID) | SUBCUTANEOUS | Status: DC

## 2013-06-17 DIAGNOSIS — N281 Cyst of kidney, acquired: Secondary | ICD-10-CM | POA: Diagnosis not present

## 2013-06-17 DIAGNOSIS — D414 Neoplasm of uncertain behavior of bladder: Secondary | ICD-10-CM | POA: Diagnosis not present

## 2013-06-17 DIAGNOSIS — R32 Unspecified urinary incontinence: Secondary | ICD-10-CM | POA: Diagnosis not present

## 2013-06-28 ENCOUNTER — Encounter: Payer: Medicare Other | Admitting: Nutrition

## 2013-07-04 ENCOUNTER — Encounter: Payer: Medicare Other | Admitting: Nutrition

## 2013-07-04 ENCOUNTER — Encounter: Payer: Medicare Other | Attending: Endocrinology | Admitting: Nutrition

## 2013-07-04 DIAGNOSIS — Z713 Dietary counseling and surveillance: Secondary | ICD-10-CM | POA: Insufficient documentation

## 2013-07-04 DIAGNOSIS — E1065 Type 1 diabetes mellitus with hyperglycemia: Secondary | ICD-10-CM | POA: Diagnosis not present

## 2013-07-04 DIAGNOSIS — IMO0002 Reserved for concepts with insufficient information to code with codable children: Secondary | ICD-10-CM | POA: Insufficient documentation

## 2013-07-05 NOTE — Patient Instructions (Signed)
Increase Levemir  1 unit  Decrease Novolog before supper  By 1u. Test blood sugars before meals and at bedtime Call blood sugars on Wednesday

## 2013-07-05 NOTE — Progress Notes (Signed)
Pt. Here with wife.  He did not bring his meter.  Says FBSs are close to 200 q AM.  AcL: (if active in the AM, usually 170s, if not active, around 200.  AcS: 170s-200.  Not testing at HS.  Insulin:  Levemir: 7u q AM,  Novolog 5-6u acB, 4-5u acL and supper.   Pt. Reports that he takes more Novolog (1-2u ) when eating more, or if blood sugars are high.    Low blood sugars:  Pt. Has had 1 low blood sugar at 11PM.  Not sure if he took too much Novolog, or did not eat enough,   Diet:  Nothing has changed from previous diet history, but his wife says that he is eating is eating cookies, icecream or other snacks.  Pt. Says that he does not take extra Novolog when he does this.  Plan: 1 Test blood sugars ac meals and at HS.  2. Increase Levemir by 1 unit, and decrease Novolog acS by 1u.  3.  Take 1 extra unit of novolog 3.  Call blood sugars on Wednesday.  4. I will review blood sugar readings at that time

## 2013-07-11 ENCOUNTER — Telehealth: Payer: Self-pay | Admitting: Endocrinology

## 2013-07-11 NOTE — Telephone Encounter (Signed)
Pt. Phoned blood sugar readings: DATE        acB          acL       acS        HS 12/10        170           196  12/11        184                        136        145 12/12        227           217       117        141 12/13        174          162        263        224 12/14        194          260        131        146 12/15        162 Taking 8u Levemir, and Novolog:  4 acB, 4-5acL, 4-5 acS Plan:  Increase levemir to 8u and increase Novolog ac B to 5u.

## 2013-07-11 NOTE — Telephone Encounter (Signed)
?   Old and new Levemir doses same? Needs 2 more units

## 2013-07-11 NOTE — Telephone Encounter (Signed)
Pt was instructed to go up 2 more units of levemir

## 2013-07-22 NOTE — Telephone Encounter (Signed)
On 10 units

## 2013-07-25 ENCOUNTER — Ambulatory Visit (INDEPENDENT_AMBULATORY_CARE_PROVIDER_SITE_OTHER): Payer: Medicare Other | Admitting: Family Medicine

## 2013-07-25 DIAGNOSIS — E538 Deficiency of other specified B group vitamins: Secondary | ICD-10-CM

## 2013-07-25 MED ORDER — CYANOCOBALAMIN 1000 MCG/ML IJ SOLN
1000.0000 ug | Freq: Once | INTRAMUSCULAR | Status: AC
  Administered 2013-07-25: 1000 ug via INTRAMUSCULAR

## 2013-08-02 ENCOUNTER — Ambulatory Visit (INDEPENDENT_AMBULATORY_CARE_PROVIDER_SITE_OTHER): Payer: Medicare Other | Admitting: Endocrinology

## 2013-08-02 ENCOUNTER — Encounter: Payer: Self-pay | Admitting: Endocrinology

## 2013-08-02 VITALS — BP 106/58 | HR 64 | Temp 98.5°F | Resp 12 | Ht 69.0 in | Wt 201.1 lb

## 2013-08-02 DIAGNOSIS — IMO0001 Reserved for inherently not codable concepts without codable children: Secondary | ICD-10-CM

## 2013-08-02 DIAGNOSIS — N182 Chronic kidney disease, stage 2 (mild): Secondary | ICD-10-CM | POA: Insufficient documentation

## 2013-08-02 DIAGNOSIS — E1165 Type 2 diabetes mellitus with hyperglycemia: Principal | ICD-10-CM

## 2013-08-02 NOTE — Progress Notes (Signed)
Patient ID: [name], male   DOB: 01/05/1929, 80 y.o.   MRN: [id number]  [name] is an 80 y.o. male.   Reason for Appointment: Diabetes follow-up   History of Present Illness   Diagnosis: Type 2 DIABETES MELITUS, diagnoses 1972   He has had long-standing diabetes and has been on basal bolus insulin regimen after failure of oral hypoglycemic drugs over the last several years His blood sugars have been generally fairly well controlled but has a tendency to high postprandial readings  RECENT history: He has been checking blood sugars at least once a day but still mostly in the morning   Since his blood sugars were overall higher especially in the evening his Lantus was changed to the morning. After discussion with nurse educator he is Levemir dose has been gradually increased from the previous dose of 5 units  With this his fasting readings are somewhat better but still 140-160 range mostly Has only occasional readings midday or afternoon recently which are fair but he had a couple of readings over 300 around Christmas COMPLIANCE with insulin regimen appears to be fairly good    Oral hypoglycemic drugs: Metformin, 1500 mg and Amaryl        Side effects from medications: None Insulin regimen: Levemir 10 units at night, NovoLog 5 a.c before meals           Proper timing of medications in relation to meals: Yes.         Monitors blood glucose: Once a day.    Glucometer: One Touch.          Blood Glucose readings from meter download:  PREMEAL Breakfast Lunch Dinner Bedtime Overall  Glucose range:  116-193   159-249   106-306   118    Mean/median:  156      159     Hypoglycemia frequency:  none .          Meals: 3 meals per day. Supper at 6 pm; breakfast is oatmeal with eggs          Physical activity: exercise: Some walking but overall less active this winter         Complications are: Minimal     Wt Readings from Last 3 Encounters:  08/02/13 201 lb 1.6 oz (91.218 kg)   05/31/13 203 lb (92.08 kg)  03/14/13 198 lb (89.812 kg)   Lab Results  Component Value Date   HGBA1C 7.8* 05/31/2013   HGBA1C 7.4* 03/01/2013   Lab Results  Component Value Date   MICROALBUR 0.6 03/01/2013   LDLCALC 62 05/31/2013   CREATININE 1.4 05/31/2013       Medication List       This list is accurate as of: 08/02/13  8:31 PM.  Always use your most recent med list.               aspirin 81 MG tablet  Take 81 mg by mouth daily.     atenolol 25 MG tablet  Commonly known as:  TENORMIN  Take 12.5-25 mg by mouth daily. 1 in the morning and one-half at bedtime     cholecalciferol 1000 UNITS tablet  Commonly known as:  VITAMIN D  Take 1,000 Units by mouth daily.     docusate sodium 100 MG capsule  Commonly known as:  COLACE  Take 100 mg by mouth 2 (two) times daily.     ferrous gluconate 325 MG tablet  Commonly known as:  [employer] Enterprises[employer]GON  Take 325 mg by mouth daily with breakfast.     glimepiride 4 MG tablet  Commonly known as:  AMARYL  Take 4 mg by mouth daily.     hydrochlorothiazide 25 MG tablet  Commonly known as:  HYDRODIURIL  Take 12.5 mg by mouth daily.     hyoscyamine 0.125 MG tablet  Commonly known as:  LEVSIN, ANASPAZ  Take 1 tablet (0.125 mg total) by mouth every 4 (four) hours as needed for cramping (bladder spasms).     insulin aspart 100 UNIT/ML injection  Commonly known as:  novoLOG  Inject 4-5 Units into the skin 3 (three) times daily before meals.     LEVEMIR FLEXPEN 100 UNIT/ML Pen  Generic drug:  Insulin Detemir  TAKE 10 UNITS SUBCUTANEOUS in am     loratadine 10 MG tablet  Commonly known as:  CLARITIN  Take 10 mg by mouth daily.     metFORMIN 1000 MG tablet  Commonly known as:  GLUCOPHAGE  Take 1,000 mg by mouth 2 (two) times daily with a meal. Take one tablet in am and 1/2 tablet at bedtime     omeprazole 20 MG capsule  Commonly known as:  PRILOSEC  Take 20 mg by mouth daily.     oxybutynin 10 MG 24 hr tablet  Commonly known as:   DITROPAN-XL     oxyCODONE 5 MG immediate release tablet  Commonly known as:  Oxy IR/ROXICODONE  Take 1-2 tablets (5-10 mg total) by mouth every 4 (four) hours as needed for pain.     simvastatin 80 MG tablet  Commonly known as:  ZOCOR  Take 40 mg by mouth at bedtime.     tamsulosin 0.4 MG Caps capsule  Commonly known as:  FLOMAX  Take 0.4 mg by mouth daily after supper.     trandolapril 1 MG tablet  Commonly known as:  MAVIK  TAKE 1 TABLET EVERY DAY     VITAMIN B-12 IJ  Inject 1,000 mcg as directed every 21 ( twenty-one) days.        Allergies:  Allergies  Allergen Reactions  . Avodart [Dutasteride] Swelling  . Ibuprofen Nausea And Vomiting  . Morphine Nausea And Vomiting    Past Medical History  Diagnosis Date  . Diabetes mellitus   . Colon polyps   . BPH (benign prostatic hypertrophy)   . Osteoarthritis   . History of kidney stones   . Hypertension   . Hyperlipidemia   . B12 deficiency   . GERD (gastroesophageal reflux disease)     hiatal hernia    Past Surgical History  Procedure Laterality Date  . Appendectomy    . Partial amputaion left foot    . Knee cartilage surgery      Arthroscope  . Transurethral resection of prostate  06/17/2012    Procedure: TRANSURETHRAL RESECTION OF THE PROSTATE WITH GYRUS INSTRUMENTS;  Surgeon: Molli Hazard, MD;  Location: [facility];  Service: Urology;  Laterality: N/A;  . Cystoscopy with biopsy  06/17/2012    Procedure: CYSTOSCOPY WITH BIOPSY;  Surgeon: Molli Hazard, MD;  Location: [facility];  Service: Urology;  Laterality: N/A;    No family history on file.  Social History:  reports that he has quit smoking. He does not have any smokeless tobacco history on file. He reports that he does not drink alcohol or use illicit drugs.  Review of Systems:  HYPERTENSION:  usually well controlled  HYPERLIPIDEMIA:   Treated with high-dose simvastatin  Lab Results  Component Value Date   CHOL 127 05/31/2013   HDL  51.60 05/31/2013   LDLCALC 62 05/31/2013   TRIG 69.0 05/31/2013   CHOLHDL 2 05/31/2013        Examination:   BP 106/58  Pulse 64  Temp(Src) 98.5 F (36.9 C)  Resp 12  Ht 5\' 9"  (1.753 m)  Wt 201 lb 1.6 oz (91.218 kg)  BMI 29.68 kg/m2  SpO2 95%  Body mass index is 29.68 kg/(m^2).   ASSESSMENT/ PLAN::   1. Diabetes type 2 requiring insulin  The patient's diabetes control appears to be somewhat better but difficult to assess since he is checking mostly in the morning Currently requiring larger doses of insulin especially with not doing regular exercise Not clear if Amaryl is helping with glycemic control, has had long-standing diabetes  Plan: Since his readings are still above target in the morning will increase his Levemir by 2 units He will increase his NovoLog by 1 unit throughout  Given written instructions for the changes and blood sugar targets Discussed needing to check more readings after supper and to keep them at least below 180, about 2 hours p.c.   Chronic kidney disease: Creatinine to be checked again, previously 1.4  HYPERCHOLESTEROLEMIA: Lipids previously goal   Tyvon Eggenberger 08/02/2013, 8:31 PM

## 2013-08-02 NOTE — Patient Instructions (Signed)
Please check blood sugars at least half the time about 2 hours after any meal and as directed on waking up. Please bring blood sugar monitor to each visit  NOVOLOG 6 AT [place] 12 UNITS DAILY, ADJUST by 2 units if am sugar staya over 140 or gets < 90  Stop Glimeperide when out

## 2013-08-22 ENCOUNTER — Ambulatory Visit (INDEPENDENT_AMBULATORY_CARE_PROVIDER_SITE_OTHER): Payer: Medicare Other | Admitting: Family Medicine

## 2013-08-22 DIAGNOSIS — E538 Deficiency of other specified B group vitamins: Secondary | ICD-10-CM

## 2013-08-22 MED ORDER — CYANOCOBALAMIN 1000 MCG/ML IJ SOLN
1000.0000 ug | Freq: Once | INTRAMUSCULAR | Status: AC
  Administered 2013-08-22: 1000 ug via INTRAMUSCULAR

## 2013-09-14 ENCOUNTER — Encounter: Payer: Self-pay | Admitting: Endocrinology

## 2013-09-14 ENCOUNTER — Ambulatory Visit: Payer: Medicare Other | Admitting: Endocrinology

## 2013-09-14 ENCOUNTER — Ambulatory Visit (INDEPENDENT_AMBULATORY_CARE_PROVIDER_SITE_OTHER): Payer: Medicare Other | Admitting: Endocrinology

## 2013-09-14 VITALS — BP 128/62 | HR 57 | Temp 98.4°F | Resp 16 | Ht 69.0 in | Wt 201.2 lb

## 2013-09-14 DIAGNOSIS — E7800 Pure hypercholesterolemia, unspecified: Secondary | ICD-10-CM | POA: Diagnosis not present

## 2013-09-14 DIAGNOSIS — N182 Chronic kidney disease, stage 2 (mild): Secondary | ICD-10-CM

## 2013-09-14 DIAGNOSIS — IMO0001 Reserved for inherently not codable concepts without codable children: Secondary | ICD-10-CM | POA: Diagnosis not present

## 2013-09-14 DIAGNOSIS — E1165 Type 2 diabetes mellitus with hyperglycemia: Principal | ICD-10-CM

## 2013-09-14 LAB — URINALYSIS, ROUTINE W REFLEX MICROSCOPIC
Bilirubin Urine: NEGATIVE
Ketones, ur: NEGATIVE
Nitrite: NEGATIVE
PH: 5.5 (ref 5.0–8.0)
SPECIFIC GRAVITY, URINE: 1.02 (ref 1.000–1.030)
TOTAL PROTEIN, URINE-UPE24: NEGATIVE
URINE GLUCOSE: NEGATIVE
UROBILINOGEN UA: 0.2 (ref 0.0–1.0)

## 2013-09-14 LAB — HEMOGLOBIN A1C: HEMOGLOBIN A1C: 7.6 % — AB (ref 4.6–6.5)

## 2013-09-14 LAB — COMPREHENSIVE METABOLIC PANEL
ALBUMIN: 3.9 g/dL (ref 3.5–5.2)
ALT: 17 U/L (ref 0–53)
AST: 20 U/L (ref 0–37)
Alkaline Phosphatase: 65 U/L (ref 39–117)
BUN: 22 mg/dL (ref 6–23)
CALCIUM: 9 mg/dL (ref 8.4–10.5)
CHLORIDE: 105 meq/L (ref 96–112)
CO2: 25 mEq/L (ref 19–32)
Creatinine, Ser: 1.3 mg/dL (ref 0.4–1.5)
GFR: 58.72 mL/min — ABNORMAL LOW (ref >60.00)
Glucose, Bld: 195 mg/dL — ABNORMAL HIGH (ref 70–99)
POTASSIUM: 4.2 meq/L (ref 3.5–5.1)
SODIUM: 138 meq/L (ref 135–145)
TOTAL PROTEIN: 6.8 g/dL (ref 6.0–8.3)
Total Bilirubin: 0.5 mg/dL (ref 0.3–1.2)

## 2013-09-14 LAB — MICROALBUMIN / CREATININE URINE RATIO
CREATININE, U: 95.1 mg/dL
Microalb Creat Ratio: 3 mg/g (ref 0.0–30.0)
Microalb, Ur: 2.9 mg/dL — ABNORMAL HIGH (ref 0.0–1.9)

## 2013-09-14 NOTE — Progress Notes (Signed)
Patient ID: [name], male   DOB: 22-Sep-1928, 80 y.o.   MRN: [id number]   Reason for Appointment: Diabetes follow-up   History of Present Illness   Diagnosis: Type 2 DIABETES MELITUS, diagnoses 1972   He has had long-standing diabetes and has been on basal bolus insulin regimen after failure of oral hypoglycemic drugs over the last several years His blood sugars have been generally fairly well controlled but has a tendency to high postprandial readings Previously required only a small dose of 5 units of Levemir insulin   RECENT history: On his last visit he was told to increase both his Levemir and NovoLog insulin because of relatively high readings especially during the day. Again he has not checked his sugars much after meals and difficult to know if he is still having postprandial hyperglycemia. Has only one reading after supper Fasting glucose readings have been quite variable and he thinks these are related to diet the night before.  Since his blood sugars were overall higher especially in the evening his Levemir was changed to the morning. COMPLIANCE with insulin regimen appears to be good     Oral hypoglycemic drugs: Metformin, 1500 mg and Amaryl        Side effects from medications: None Insulin regimen: Levemir 10 units at am, NovoLog 4-5 a.c before meals           Proper timing of medications in relation to meals: Yes.         Monitors blood glucose: Once a day.    Glucometer: One Touch.          Blood Glucose readings from meter download:  PREMEAL Breakfast Lunch Dinner  PCS  Overall  Glucose range:  118-205   175   71-102   149, 198   62-205   Mean/median:  165      165    Has one reading after lunch of 180.  Hypoglycemia frequency:  recently only 1 minor episode With glucose 62 at 4:30 PM .          Meals: 3 meals per day. Supper at 6 pm; breakfast is oatmeal with eggs          Physical activity: exercise: Some walking but overall less active this winter          Complications are: Minimal    Eye exam last 5/14  Wt Readings from Last 3 Encounters:  09/14/13 201 lb 3.2 oz (91.264 kg)  08/02/13 201 lb 1.6 oz (91.218 kg)  05/31/13 203 lb (92.08 kg)   Lab Results  Component Value Date   HGBA1C 7.8* 05/31/2013   HGBA1C 7.4* 03/01/2013   Lab Results  Component Value Date   MICROALBUR 0.6 03/01/2013   LDLCALC 62 05/31/2013   CREATININE 1.4 05/31/2013       Medication List       This list is accurate as of: 09/14/13 10:46 AM.  Always use your most recent med list.               aspirin 81 MG tablet  Take 81 mg by mouth daily.     atenolol 25 MG tablet  Commonly known as:  TENORMIN  Take 12.5-25 mg by mouth daily. 1 in the morning and one-half at bedtime     cholecalciferol 1000 UNITS tablet  Commonly known as:  VITAMIN D  Take 1,000 Units by mouth daily.     docusate sodium 100 MG capsule  Commonly known as:  COLACE  Take 100 mg by mouth 2 (two) times daily.     ferrous gluconate 325 MG tablet  Commonly known as:  FERGON  Take 325 mg by mouth daily with breakfast.     glimepiride 4 MG tablet  Commonly known as:  AMARYL  Take 4 mg by mouth daily.     hydrochlorothiazide 25 MG tablet  Commonly known as:  HYDRODIURIL  Take 12.5 mg by mouth daily.     hyoscyamine 0.125 MG tablet  Commonly known as:  LEVSIN, ANASPAZ  Take 1 tablet (0.125 mg total) by mouth every 4 (four) hours as needed for cramping (bladder spasms).     insulin aspart 100 UNIT/ML injection  Commonly known as:  novoLOG  Inject 4-5 Units into the skin 3 (three) times daily before meals.     LEVEMIR FLEXPEN 100 UNIT/ML Pen  Generic drug:  Insulin Detemir  TAKE 10 UNITS SUBCUTANEOUS in am     loratadine 10 MG tablet  Commonly known as:  CLARITIN  Take 10 mg by mouth daily.     metFORMIN 1000 MG tablet  Commonly known as:  GLUCOPHAGE  Take 1,000 mg by mouth 2 (two) times daily with a meal. Take one tablet in am and 1/2 tablet at bedtime     omeprazole 20  MG capsule  Commonly known as:  PRILOSEC  Take 20 mg by mouth daily.     oxybutynin 10 MG 24 hr tablet  Commonly known as:  DITROPAN-XL     simvastatin 80 MG tablet  Commonly known as:  ZOCOR  Take 40 mg by mouth at bedtime.     tamsulosin 0.4 MG Caps capsule  Commonly known as:  FLOMAX  Take 0.4 mg by mouth daily after supper.     trandolapril 1 MG tablet  Commonly known as:  MAVIK  TAKE 1 TABLET EVERY DAY     VITAMIN B-12 IJ  Inject 1,000 mcg as directed every 21 ( twenty-one) days.        Allergies:  Allergies  Allergen Reactions  . Avodart [Dutasteride] Swelling  . Ibuprofen Nausea And Vomiting  . Morphine Nausea And Vomiting    Past Medical History  Diagnosis Date  . Diabetes mellitus   . Colon polyps   . BPH (benign prostatic hypertrophy)   . Osteoarthritis   . History of kidney stones   . Hypertension   . Hyperlipidemia   . B12 deficiency   . GERD (gastroesophageal reflux disease)     hiatal hernia    Past Surgical History  Procedure Laterality Date  . Appendectomy    . Partial amputaion left foot    . Knee cartilage surgery      Arthroscope  . Transurethral resection of prostate  06/17/2012    Procedure: TRANSURETHRAL RESECTION OF THE PROSTATE WITH GYRUS INSTRUMENTS;  Surgeon: Molli Hazard, MD;  Location: [facility];  Service: Urology;  Laterality: N/A;  . Cystoscopy with biopsy  06/17/2012    Procedure: CYSTOSCOPY WITH BIOPSY;  Surgeon: Molli Hazard, MD;  Location: [facility];  Service: Urology;  Laterality: N/A;    No family history on file.  Social History:  reports that he has quit smoking. He does not have any smokeless tobacco history on file. He reports that he does not drink alcohol or use illicit drugs.  Review of Systems:  HYPERTENSION: mild and usually well controlled  HYPERLIPIDEMIA:   Treated with high-dose simvastatin   Lab Results  Component Value Date  CHOL 127 05/31/2013   HDL 51.60 05/31/2013   LDLCALC 62  05/31/2013   TRIG 69.0 05/31/2013   CHOLHDL 2 05/31/2013        Examination:   BP 128/62  Pulse 57  Temp(Src) 98.4 F (36.9 C)  Resp 16  Ht 5\' 9"  (1.753 m)  Wt 201 lb 3.2 oz (91.264 kg)  BMI 29.70 kg/m2  SpO2 95%  Body mass index is 29.7 kg/(m^2).   ASSESSMENT/ PLAN::   . Diabetes type 2 requiring insulin  The patient's diabetes control  is again difficult  to assess since he is checking mostly in the morning He does have fewer hyperglycemic events later in the day compared to last time from somewhat better diet However he has not changed his insulin doses as directed for persistently high fasting readings   Recommendations:  Since his readings are still above target in the morning will increase his Levemir  to 11 units at least and he can go to 12 units if fasting reading stays over 140. Continue taking Levemir in the morning  He will increase his NovoLog If his readings after meals are consistently over 160-180  Given written instructions for the changes and blood sugar targets He can stop his Amaryl when he runs out  Chronic kidney disease: Creatinine to be checked again, previously 1.4   [name] 09/14/2013, 10:46 AM

## 2013-09-14 NOTE — Patient Instructions (Signed)
Levemir 11 units, if am sugar stays>140 go up to 12  Check sugars around 8-9 pm at least 2x per week

## 2013-11-01 ENCOUNTER — Ambulatory Visit (INDEPENDENT_AMBULATORY_CARE_PROVIDER_SITE_OTHER): Payer: Medicare Other | Admitting: Family Medicine

## 2013-11-01 DIAGNOSIS — E538 Deficiency of other specified B group vitamins: Secondary | ICD-10-CM | POA: Diagnosis not present

## 2013-11-01 MED ORDER — CYANOCOBALAMIN 1000 MCG/ML IJ SOLN
1000.0000 ug | Freq: Once | INTRAMUSCULAR | Status: AC
  Administered 2013-11-01: 1000 ug via INTRAMUSCULAR

## 2013-11-14 ENCOUNTER — Encounter: Payer: Self-pay | Admitting: Cardiovascular Disease

## 2013-11-14 ENCOUNTER — Ambulatory Visit (INDEPENDENT_AMBULATORY_CARE_PROVIDER_SITE_OTHER): Payer: Medicare Other | Admitting: Cardiovascular Disease

## 2013-11-14 VITALS — BP 110/64 | HR 60 | Ht 69.5 in | Wt 196.3 lb

## 2013-11-14 DIAGNOSIS — D649 Anemia, unspecified: Secondary | ICD-10-CM

## 2013-11-14 DIAGNOSIS — Z9079 Acquired absence of other genital organ(s): Secondary | ICD-10-CM

## 2013-11-14 DIAGNOSIS — N390 Urinary tract infection, site not specified: Secondary | ICD-10-CM | POA: Diagnosis not present

## 2013-11-14 DIAGNOSIS — E785 Hyperlipidemia, unspecified: Secondary | ICD-10-CM | POA: Diagnosis not present

## 2013-11-14 DIAGNOSIS — I700 Atherosclerosis of aorta: Secondary | ICD-10-CM

## 2013-11-14 DIAGNOSIS — IMO0001 Reserved for inherently not codable concepts without codable children: Secondary | ICD-10-CM

## 2013-11-14 DIAGNOSIS — I10 Essential (primary) hypertension: Secondary | ICD-10-CM

## 2013-11-14 DIAGNOSIS — Z9889 Other specified postprocedural states: Secondary | ICD-10-CM

## 2013-11-14 NOTE — Patient Instructions (Signed)
Your physician recommends that you schedule a follow-up appointment in: 1 year. No changes were made today in your therapy. 

## 2013-11-16 ENCOUNTER — Encounter: Payer: Self-pay | Admitting: Cardiovascular Disease

## 2013-11-16 DIAGNOSIS — Z9079 Acquired absence of other genital organ(s): Secondary | ICD-10-CM | POA: Insufficient documentation

## 2013-11-16 DIAGNOSIS — IMO0001 Reserved for inherently not codable concepts without codable children: Secondary | ICD-10-CM | POA: Insufficient documentation

## 2013-11-16 DIAGNOSIS — N390 Urinary tract infection, site not specified: Secondary | ICD-10-CM | POA: Insufficient documentation

## 2013-11-16 NOTE — Progress Notes (Signed)
Patient ID: [name], male   DOB: [birth year], 80 y.o.   MRN: [id number]     HPI: [name] is a 80 y.o. male who presents for 1 year cardiology evaluation.  [name] has a history of type 2 diabetes mellitus, hypertension, hyperlipidemia, previously documented grade 1 diastolic dysfunction, and has had intermittent peripheral edema.  He has been on beta blocker therapy, which has controlled a prior episode of atrial tachycardia, which occurred with stress.  [name] and gets his medications at the [place] [name].  He has remained relatively stable over the past year.  He is status post TURP.  He tells me he recently had laboratory checked at the [name] on 10/31/2013.  He was told of having a urinary tract infection and was given a prescription for amoxicillin.  Presently, he denies episodes of chest pain.  He denies recurrent episodes of tachycardia.  He does have hyperlipidemia.  He denies significant leg swelling.  Past Medical History  Diagnosis Date  . Diabetes mellitus   . Colon polyps   . BPH (benign prostatic hypertrophy)   . Osteoarthritis   . History of kidney stones   . Hypertension   . Hyperlipidemia   . B12 deficiency   . GERD (gastroesophageal reflux disease)     hiatal hernia    Past Surgical History  Procedure Laterality Date  . Appendectomy    . Partial amputaion left foot    . Knee cartilage surgery      Arthroscope  . Transurethral resection of prostate  06/17/2012    Procedure: TRANSURETHRAL RESECTION OF THE PROSTATE WITH GYRUS INSTRUMENTS;  Surgeon: Molli Hazard, MD;  Location: [facility];  Service: Urology;  Laterality: N/A;  . Cystoscopy with biopsy  06/17/2012    Procedure: CYSTOSCOPY WITH BIOPSY;  Surgeon: Molli Hazard, MD;  Location: [facility];  Service: Urology;  Laterality: N/A;  . Carotid doppler  01/27/2011    Right & Left ICAs 0-49% diameter reduction, right & left subclavian arteries  demonstrated less than 50% diameter reduction.  . Cardiovascular stress test  09/02/2011    Post stress myocardial perfusion images show normal pattern of perfusion in all areas. No significant wall abnormalites noted.  . Transthoracic echocardiogram  09/02/2011    EF >55%, normal LV systolic function, mild diastolic dysfunction    Allergies  Allergen Reactions  . Avodart [Dutasteride] Swelling  . Ibuprofen Nausea And Vomiting  . Morphine Nausea And Vomiting    Current Outpatient Prescriptions  Medication Sig Dispense Refill  . aspirin 81 MG tablet Take 81 mg by mouth daily.      Marland Kitchen atenolol (TENORMIN) 25 MG tablet Take 12.5-25 mg by mouth daily. 1 in the morning and one-half at bedtime      . cholecalciferol (VITAMIN D) 1000 UNITS tablet Take 1,000 Units by mouth daily.      . Cyanocobalamin (VITAMIN B-12 IJ) Inject 1,000 mcg as directed every 21 ( twenty-one) days.        Marland Kitchen docusate sodium (COLACE) 100 MG capsule Take 100 mg by mouth 2 (two) times daily.      . ferrous gluconate (FERGON) 325 MG tablet Take 325 mg by mouth daily with breakfast.      . glimepiride (AMARYL) 4 MG tablet Take 4 mg by mouth daily.       . hydrochlorothiazide 25 MG tablet Take 12.5 mg by mouth daily.       . hyoscyamine (  LEVSIN, ANASPAZ) 0.125 MG tablet Take 1 tablet (0.125 mg total) by mouth every 4 (four) hours as needed for cramping (bladder spasms).  40 tablet  4  . insulin aspart (NOVOLOG) 100 UNIT/ML injection Inject 4-5 Units into the skin 3 (three) times daily before meals.  15 pen  5  . Insulin Detemir (LEVEMIR FLEXPEN) 100 UNIT/ML SOPN TAKE 10 UNITS SUBCUTANEOUS in am      . loratadine (CLARITIN) 10 MG tablet Take 10 mg by mouth daily.        . metFORMIN (GLUCOPHAGE) 1000 MG tablet Take 1,000 mg by mouth 2 (two) times daily with a meal. Take one tablet in am and 1/2 tablet at bedtime      . omeprazole (PRILOSEC) 20 MG capsule Take 20 mg by mouth daily.        Marland Kitchen oxybutynin (DITROPAN-XL) 10 MG 24 hr  tablet       . simvastatin (ZOCOR) 80 MG tablet Take 40 mg by mouth at bedtime.      . Tamsulosin HCl (FLOMAX) 0.4 MG CAPS Take 0.4 mg by mouth daily after supper.      . trandolapril (MAVIK) 1 MG tablet TAKE 1 TABLET EVERY DAY  90 tablet  3   No current facility-administered medications for this visit.    History   Social History  . Marital Status: Married    Spouse Name: N/A    Number of Children: N/A  . Years of Education: N/A   Occupational History  . Not on file.   Social History Main Topics  . Smoking status: Former Research scientist (life sciences)  . Smokeless tobacco: Never Used  . Alcohol Use: No  . Drug Use: No  . Sexual Activity: Not on file     Comment: married to [name], retired.   Other Topics Concern  . Not on file   Social History Narrative  . No narrative on file   Socially, he is married and has 2 children 5 grandchildren one great-grandchild.  There is no tobacco or alcohol use.  He is retired.  Family History  Problem Relation Age of Onset  . Heart attack Mother   . Hypertension Mother     ROS is negative for fevers, chills or night sweats.  He denies change in vision, but does were glasses.  Ears no hearing, difficulty.  He is unaware of lymphadenopathy.  He denies rash.  He denies wheezing.  He denies presyncope or syncope.  He is unaware of recent palpitations.  There is no chest pressure.  He denies nausea, vomiting, or diarrhea.  He denies blood in stool or urine.  He is being treated for urinary tract infection.  He denies claudication.  He denies myalgias.  Times there is some mild ankle swelling.  He denies difficulty with sleep.  Other comprehensive 14 point system review is negative.  PE BP 110/64  Pulse 60  Ht 5' 9.5" (1.765 m)  Wt 196 lb 4.8 oz (89.041 kg)  BMI 28.58 kg/m2  General: Alert, oriented, no distress.  Skin: normal turgor, no rashes HEENT: Normocephalic, atraumatic. Pupils round and reactive; sclera anicteric;no lid lag. Extraocular muscles intact; no  xanthelasmas. Nose without nasal septal hypertrophy Mouth/Parynx benign; Mallinpatti scale 3 Neck: No JVD, no carotid bruits; normal carotid upstroke Lungs: clear to ausculatation and percussion; no wheezing or rales Chest wall: no tenderness to palpitation Heart: RRR, s1 s2 normal; 2/6 systolic murmur concordant with his aortic sclerosis.  no diastolic murmur, rub thrills or heaves Abdomen:  soft, nontender; no hepatosplenomehaly, BS+; abdominal aorta nontender and not dilated by palpation. Back: no CVA tenderness Pulses 2+ Extremities: no clubbing cyanosis or edema, Homan's sign negative  Neurologic: grossly nonfocal; cranial nerves grossly normal. Psychologic: normal affect and mood.  ECG (independently read by me): Sinus rhythm at 60 beats per minute.  No ectopy.  Normal intervals.  LABS:  BMET    Component Value Date/Time   NA 138 09/14/2013 1107   K 4.2 09/14/2013 1107   CL 105 09/14/2013 1107   CO2 25 09/14/2013 1107   GLUCOSE 195* 09/14/2013 1107   BUN 22 09/14/2013 1107   CREATININE 1.3 09/14/2013 1107   CALCIUM 9.0 09/14/2013 1107   GFRNONAA 68* 06/17/2012 1238   GFRAA 78* 06/17/2012 1238     Hepatic Function Panel     Component Value Date/Time   PROT 6.8 09/14/2013 1107   ALBUMIN 3.9 09/14/2013 1107   AST 20 09/14/2013 1107   ALT 17 09/14/2013 1107   ALKPHOS 65 09/14/2013 1107   BILITOT 0.5 09/14/2013 1107     CBC    Component Value Date/Time   WBC 8.4 06/10/2012 1120   RBC 3.95* 06/10/2012 1120   HGB 11.0* 06/17/2012 1238   HCT 32.1* 06/17/2012 1238   PLT 165 06/10/2012 1120   MCV 96.2 06/10/2012 1120   MCH 32.4 06/10/2012 1120   MCHC 33.7 06/10/2012 1120   RDW 12.7 06/10/2012 1120   LYMPHSABS 0.9 01/23/2009 1331   MONOABS 0.5 01/23/2009 1331   EOSABS 0.1 01/23/2009 1331   BASOSABS 0.0 01/23/2009 1331     BNP No results found for this basename: probnp    Lipid Panel     Component Value Date/Time   CHOL 127 05/31/2013 1414   TRIG 69.0 05/31/2013 1414    HDL 51.60 05/31/2013 1414   CHOLHDL 2 05/31/2013 1414   VLDL 13.8 05/31/2013 1414   LDLCALC 62 05/31/2013 1414     RADIOLOGY: No results found.    ASSESSMENT AND PLAN:  Mr. Lauro Manlove is an 80 year old gentleman, who has documented normal left ventricular size and function with mild grade 1 diastolic dysfunction.  His echo Doppler study in February 2013 also showed mild aortic sclerosis, which is contributing to his cardiac murmur.  He has been on simvastatin 40 mg for his hyperlipidemia and laboratory in November 2014 showed an LDL cholesterol at 62.  He does have anemia.  He is on glimepiride, as well as 2 types of insulin for his diabetes mellitus in addition to metformin.  He has recently been started on amoxicillin for urinary tract infection.  His blood pressure today is well controlled on his current therapy with atenolol 25 mg in the morning and 12.5 mg at night, HCTZ 12.5 mg and Mavik 1 mg daily.  He is status post TURP.  His ECG today is stable.  I would try to obtain the most recent lab work done at the [name].  As long as he remains stable from a cardiovascular standpoint, I will see him in one year for reassessment.    Troy Sine, MD, [facility]  11/16/2013 5:11 PM

## 2013-11-29 ENCOUNTER — Ambulatory Visit (INDEPENDENT_AMBULATORY_CARE_PROVIDER_SITE_OTHER): Payer: Medicare Other

## 2013-11-29 VITALS — BP 128/58 | HR 58 | Resp 16 | Ht 69.5 in | Wt 196.0 lb

## 2013-11-29 DIAGNOSIS — E1142 Type 2 diabetes mellitus with diabetic polyneuropathy: Secondary | ICD-10-CM | POA: Diagnosis not present

## 2013-11-29 DIAGNOSIS — L608 Other nail disorders: Secondary | ICD-10-CM | POA: Diagnosis not present

## 2013-11-29 DIAGNOSIS — Q828 Other specified congenital malformations of skin: Secondary | ICD-10-CM | POA: Diagnosis not present

## 2013-11-29 DIAGNOSIS — E1149 Type 2 diabetes mellitus with other diabetic neurological complication: Secondary | ICD-10-CM

## 2013-11-29 DIAGNOSIS — E1140 Type 2 diabetes mellitus with diabetic neuropathy, unspecified: Secondary | ICD-10-CM

## 2013-11-29 NOTE — Progress Notes (Signed)
   Subjective:    Patient ID: [name], male    DOB: [birth year], 80 y.o.   MRN: [id number]  HPI Comments: "I have hard skin on the bottom of this toe"  Patient c/o thick, callused skin on the stump of 1st toe left foot for several years. He was just at the [place] and PCP there advised him to see podiatrist to have these areas evaluated. He was concerned that he would develop an ulcer if not trimmed regularly. Patient files that area down occasionally. He also would like his toenails cut.     Review of Systems  Genitourinary: Positive for urgency and frequency.  Skin:       Change in nails Thick scars  All other systems reviewed and are negative.      Objective:   Physical Exam 89-year-old white male consistent with her initial visit was seen worn for 5 years ago by Dr. Amalia Hailey at this time well-developed well-nourished oriented x3 is a history of diabetes on to recently was well-managed recently has some difficulties because of urinary tract infection his blood sugars have been running higher than usual. Broaching objective findings as follows vascular status is intact with pedal pulses palpable DP +2/4 bilateral PT plus one over 4 bilateral capillary refill time 3 seconds all digits skin temperature is warm turgor normal no edema rubor pallor mild varicosities noted. Neurologically epicritic and proprioceptive sensations are intact although diminished on Semmes Weinstein testing to the digits and forefoot bilateral refill plantar response DTRs not elicited dermatologically skin color pigment normal hair growth absent nails somewhat criptotic incurvated with friability discoloration and brittleness will 1 through 5 right 3 through 5 left patient had amputation of the hallux and second digit distal tuft of the second been amputated in a work accident back in the 80s not associated with his diabetes has a still keratotic lesion distal stump site and plantar stump site of the left hallux stump.  There is no discharge drainage no secondary infection noted dermatologically skin color pigment normal dry skin noted patient uses lotion daily as instructed no other abnormalities noted no other orthopedic problems noted       Assessment & Plan:  Assessment this time his diabetes with dry skin history peripheral neuropathy osseous mild angiopathy at this time dystrophic nails debrided x8 also debridement still keratotic lesion distal tuft of the left hallux suggest is a lotion or Neosporin to the area off on debridement the third toe right and sub-fourth toe left are treating with lumicain Neosporin no other active bleeding was identified at this time the patient we 534 months for continued palliative nail care in the future he dispensed literature and diabetic foot care as well contact us in changes or exacerbations occur at any time.  Harriet Masson DPM

## 2013-11-29 NOTE — Patient Instructions (Signed)
Diabetes and Foot Care Diabetes may cause you to have problems because of poor blood supply (circulation) to your feet and legs. This may cause the skin on your feet to become thinner, break easier, and heal more slowly. Your skin may become dry, and the skin may peel and crack. You may also have nerve damage in your legs and feet causing decreased feeling in them. You may not notice minor injuries to your feet that could lead to infections or more serious problems. Taking care of your feet is one of the most important things you can do for yourself.  HOME CARE INSTRUCTIONS  Wear shoes at all times, even in the house. Do not go barefoot. Bare feet are easily injured.  Check your feet daily for blisters, cuts, and redness. If you cannot see the bottom of your feet, use a mirror or ask someone for help.  Wash your feet with warm water (do not use hot water) and mild soap. Then pat your feet and the areas between your toes until they are completely dry. Do not soak your feet as this can dry your skin.  Apply a moisturizing lotion or petroleum jelly (that does not contain alcohol and is unscented) to the skin on your feet and to dry, brittle toenails. Do not apply lotion between your toes.  Trim your toenails straight across. Do not dig under them or around the cuticle. File the edges of your nails with an emery board or nail file.  Do not cut corns or calluses or try to remove them with medicine.  Wear clean socks or stockings every day. Make sure they are not too tight. Do not wear knee-high stockings since they may decrease blood flow to your legs.  Wear shoes that fit properly and have enough cushioning. To break in new shoes, wear them for just a few hours a day. This prevents you from injuring your feet. Always look in your shoes before you put them on to be sure there are no objects inside.  Do not cross your legs. This may decrease the blood flow to your feet.  If you find a minor scrape,  cut, or break in the skin on your feet, keep it and the skin around it clean and dry. These areas may be cleansed with mild soap and water. Do not cleanse the area with peroxide, alcohol, or iodine.  When you remove an adhesive bandage, be sure not to damage the skin around it.  If you have a wound, look at it several times a day to make sure it is healing.  Do not use heating pads or hot water bottles. They may burn your skin. If you have lost feeling in your feet or legs, you may not know it is happening until it is too late.  Make sure your health care provider performs a complete foot exam at least annually or more often if you have foot problems. Report any cuts, sores, or bruises to your health care provider immediately. SEEK MEDICAL CARE IF:   You have an injury that is not healing.  You have cuts or breaks in the skin.  You have an ingrown nail.  You notice redness on your legs or feet.  You feel burning or tingling in your legs or feet.  You have pain or cramps in your legs and feet.  Your legs or feet are numb.  Your feet always feel cold. SEEK IMMEDIATE MEDICAL CARE IF:   There is increasing redness,   swelling, or pain in or around a wound.  There is a red line that goes up your leg.  Pus is coming from a wound.  You develop a fever or as directed by your health care provider.  You notice a bad smell coming from an ulcer or wound. Document Released: 07/11/2000 Document Revised: 03/16/2013 Document Reviewed: 12/21/2012 [employer] Patient Information 2014 [employer].  

## 2013-12-12 ENCOUNTER — Ambulatory Visit (INDEPENDENT_AMBULATORY_CARE_PROVIDER_SITE_OTHER): Payer: Medicare Other | Admitting: *Deleted

## 2013-12-12 ENCOUNTER — Other Ambulatory Visit (INDEPENDENT_AMBULATORY_CARE_PROVIDER_SITE_OTHER): Payer: Medicare Other

## 2013-12-12 ENCOUNTER — Ambulatory Visit (INDEPENDENT_AMBULATORY_CARE_PROVIDER_SITE_OTHER): Payer: Medicare Other | Admitting: Endocrinology

## 2013-12-12 ENCOUNTER — Encounter: Payer: Self-pay | Admitting: Endocrinology

## 2013-12-12 VITALS — BP 124/60 | HR 65 | Temp 97.9°F | Resp 16 | Ht 69.0 in | Wt 176.0 lb

## 2013-12-12 DIAGNOSIS — E7800 Pure hypercholesterolemia, unspecified: Secondary | ICD-10-CM | POA: Diagnosis not present

## 2013-12-12 DIAGNOSIS — E539 Vitamin B deficiency, unspecified: Secondary | ICD-10-CM

## 2013-12-12 DIAGNOSIS — I10 Essential (primary) hypertension: Secondary | ICD-10-CM

## 2013-12-12 DIAGNOSIS — E1165 Type 2 diabetes mellitus with hyperglycemia: Principal | ICD-10-CM

## 2013-12-12 DIAGNOSIS — IMO0001 Reserved for inherently not codable concepts without codable children: Secondary | ICD-10-CM

## 2013-12-12 DIAGNOSIS — E785 Hyperlipidemia, unspecified: Secondary | ICD-10-CM | POA: Diagnosis not present

## 2013-12-12 LAB — URINALYSIS, ROUTINE W REFLEX MICROSCOPIC
BILIRUBIN URINE: NEGATIVE
HGB URINE DIPSTICK: NEGATIVE
Ketones, ur: NEGATIVE
Leukocytes, UA: NEGATIVE
Nitrite: NEGATIVE
RBC / HPF: NONE SEEN (ref 0–?)
Specific Gravity, Urine: >=1.03 — AB (ref 1.000–1.030)
TOTAL PROTEIN, URINE-UPE24: NEGATIVE
URINE GLUCOSE: NEGATIVE
Urobilinogen, UA: 0.2 (ref 0.0–1.0)
WBC, UA: NONE SEEN (ref 0–?)
pH: 5.5 (ref 5.0–8.0)

## 2013-12-12 LAB — BASIC METABOLIC PANEL
BUN: 27 mg/dL — ABNORMAL HIGH (ref 6–23)
CHLORIDE: 103 meq/L (ref 96–112)
CO2: 26 mEq/L (ref 19–32)
Calcium: 8.9 mg/dL (ref 8.4–10.5)
Creatinine, Ser: 1.2 mg/dL (ref 0.4–1.5)
GFR: 59.23 mL/min — ABNORMAL LOW (ref >60.00)
Glucose, Bld: 236 mg/dL — ABNORMAL HIGH (ref 70–99)
Potassium: 4.2 mEq/L (ref 3.5–5.1)
Sodium: 137 mEq/L (ref 135–145)

## 2013-12-12 LAB — HEMOGLOBIN A1C: Hgb A1c MFr Bld: 9.2 % — ABNORMAL HIGH (ref 4.6–6.5)

## 2013-12-12 LAB — LIPID PANEL
CHOLESTEROL: 127 mg/dL (ref 0–200)
HDL: 56.1 mg/dL (ref >39.00)
LDL CALC: 63 mg/dL (ref 0–99)
TRIGLYCERIDES: 40 mg/dL (ref 0.0–149.0)
Total CHOL/HDL Ratio: 2
VLDL: 8 mg/dL (ref 0.0–40.0)

## 2013-12-12 MED ORDER — GLIMEPIRIDE 4 MG PO TABS: 4.0000 mg | ORAL_TABLET | Freq: Every day | ORAL | Status: DC

## 2013-12-12 MED ORDER — CYANOCOBALAMIN 1000 MCG/ML IJ SOLN
1000.0000 ug | Freq: Once | INTRAMUSCULAR | Status: AC
  Administered 2013-12-12: 1000 ug via INTRAMUSCULAR

## 2013-12-12 NOTE — Progress Notes (Signed)
Patient ID: GROVE DEFINA, male   DOB: 11-30-1928, 80 y.o.   MRN: [id number]   Reason for Appointment: Diabetes follow-up   History of Present Illness   Diagnosis: Type 2 DIABETES MELITUS, diagnoses 1972   He has had long-standing diabetes and has been on basal bolus insulin regimen after failure of oral hypoglycemic drugs over the last several years His blood sugars have been generally fairly well controlled but has a tendency to high postprandial readings Previously required only a small dose of 5 units of Levemir insulin   RECENT history:   On his last visit he was told to leave off his Amaryl which she did about a week or so after his visit in 2/15. His blood sugars recently are much higher than usual and he thinks they started going up only about a month ago He was told by the [facility] that he had a UTI last month but he was asymptomatic He has lost some weight that is also complaining of increased thirst and urination Did not call to report a high readings; he has had some readings well over 300 at suppertime also Still compliant with his insulin doses before meals as before and has not changed his diet, his wife was also able to corroborate this Fasting glucose readings have been quite variable and he thinks these are related to diet the night before..  Has only one reading after supper and has inconsistent blood sugars around 5 PM before supper  Since his blood sugars were overall higher especially in the evening his Levemir was changed to the morning. COMPLIANCE with insulin regimen appears to be good     Oral hypoglycemic drugs: Metformin, 1500 mg          Side effects from medications: None Insulin regimen: Levemir 10 units at am, NovoLog 4-5 a.c before meals           Proper timing of medications in relation to meals: Yes.         Monitors blood glucose: Once a day.    Glucometer: One Touch.          Blood Glucose readings from meter download:   PREMEAL  Breakfast Lunch Dinner Bedtime Overall  Glucose range:  171-252   199-258   146-383   163   146-383   Median:  208    244    210    Hypoglycemia: None           Meals: 3 meals per day. Supper at 5-6 pm; breakfast is oatmeal with eggs. Drinks water mostly and no sweet drinks           Physical activity: exercise: Some walking and gardening, occasionally treadmill        Complications are: Minimal    Eye exam last 5/14  Wt Readings from Last 3 Encounters:  12/12/13 176 lb (79.833 kg)  11/29/13 196 lb (88.905 kg)  11/14/13 196 lb 4.8 oz (89.041 kg)   Lab Results  Component Value Date   HGBA1C 7.6* 09/14/2013   HGBA1C 7.8* 05/31/2013   HGBA1C 7.4* 03/01/2013   Lab Results  Component Value Date   MICROALBUR 2.9* 09/14/2013   LDLCALC 62 05/31/2013   CREATININE 1.3 09/14/2013       Medication List       This list is accurate as of: 12/12/13 11:14 AM.  Always use your most recent med list.  aspirin 81 MG tablet  Take 81 mg by mouth daily.     atenolol 25 MG tablet  Commonly known as:  TENORMIN  Take 12.5-25 mg by mouth daily. 1 in the morning and one-half at bedtime     cholecalciferol 1000 UNITS tablet  Commonly known as:  VITAMIN D  Take 1,000 Units by mouth daily.     docusate sodium 100 MG capsule  Commonly known as:  COLACE  Take 100 mg by mouth 2 (two) times daily.     ferrous gluconate 325 MG tablet  Commonly known as:  FERGON  Take 325 mg by mouth daily with breakfast.     glimepiride 4 MG tablet  Commonly known as:  AMARYL  Take 4 mg by mouth daily.     hydrochlorothiazide 25 MG tablet  Commonly known as:  HYDRODIURIL  Take 12.5 mg by mouth daily.     hyoscyamine 0.125 MG tablet  Commonly known as:  LEVSIN, ANASPAZ  Take 1 tablet (0.125 mg total) by mouth every 4 (four) hours as needed for cramping (bladder spasms).     insulin aspart 100 UNIT/ML injection  Commonly known as:  novoLOG  Inject 4-5 Units into the skin 3 (three) times daily  before meals.     LEVEMIR FLEXPEN 100 UNIT/ML Pen  Generic drug:  Insulin Detemir  TAKE 10 UNITS SUBCUTANEOUS in am     loratadine 10 MG tablet  Commonly known as:  CLARITIN  Take 10 mg by mouth daily.     metFORMIN 1000 MG tablet  Commonly known as:  GLUCOPHAGE  Take 1,000 mg by mouth 2 (two) times daily with a meal. Take one tablet in am and 1/2 tablet at bedtime     omeprazole 20 MG capsule  Commonly known as:  PRILOSEC  Take 20 mg by mouth daily.     oxybutynin 10 MG 24 hr tablet  Commonly known as:  DITROPAN-XL     simvastatin 80 MG tablet  Commonly known as:  ZOCOR  Take 40 mg by mouth at bedtime.     tamsulosin 0.4 MG Caps capsule  Commonly known as:  FLOMAX  Take 0.4 mg by mouth daily after supper.     trandolapril 1 MG tablet  Commonly known as:  MAVIK  TAKE 1 TABLET EVERY DAY     VITAMIN B-12 IJ  Inject 1,000 mcg as directed every 21 ( twenty-one) days.        Allergies:  Allergies  Allergen Reactions  . Avodart [Dutasteride] Swelling  . Ibuprofen Nausea And Vomiting  . Morphine Nausea And Vomiting    Past Medical History  Diagnosis Date  . Diabetes mellitus   . Colon polyps   . BPH (benign prostatic hypertrophy)   . Osteoarthritis   . History of kidney stones   . Hypertension   . Hyperlipidemia   . B12 deficiency   . GERD (gastroesophageal reflux disease)     hiatal hernia    Past Surgical History  Procedure Laterality Date  . Appendectomy    . Partial amputaion left foot    . Knee cartilage surgery      Arthroscope  . Transurethral resection of prostate  06/17/2012    Procedure: TRANSURETHRAL RESECTION OF THE PROSTATE WITH GYRUS INSTRUMENTS;  Surgeon: Molli Hazard, MD;  Location: [facility];  Service: Urology;  Laterality: N/A;  . Cystoscopy with biopsy  06/17/2012    Procedure: CYSTOSCOPY WITH BIOPSY;  Surgeon: Molli Hazard, MD;  Location: [facility];  Service: Urology;  Laterality: N/A;  . Carotid doppler  01/27/2011     Right & Left ICAs 0-49% diameter reduction, right & left subclavian arteries demonstrated less than 50% diameter reduction.  . Cardiovascular stress test  09/02/2011    Post stress myocardial perfusion images show normal pattern of perfusion in all areas. No significant wall abnormalites noted.  . Transthoracic echocardiogram  09/02/2011    EF >55%, normal LV systolic function, mild diastolic dysfunction    Family History  Problem Relation Age of Onset  . Heart attack Mother   . Hypertension Mother     Social History:  reports that he has quit smoking. He has never used smokeless tobacco. He reports that he does not drink alcohol or use illicit drugs.  Review of Systems:  HYPERTENSION: mild and usually well controlled, periodically checking at home also  HYPERLIPIDEMIA:   Treated with high-dose simvastatin   Lab Results  Component Value Date   CHOL 127 05/31/2013   HDL 51.60 05/31/2013   LDLCALC 62 05/31/2013   TRIG 69.0 05/31/2013   CHOLHDL 2 05/31/2013        Examination:   BP 124/60  Pulse 65  Temp(Src) 97.9 F (36.6 C)  Resp 16  Ht 5\' 9"  (1.753 m)  Wt 176 lb (79.833 kg)  BMI 25.98 kg/m2  SpO2 97%  Body mass index is 25.98 kg/(m^2).   ASSESSMENT/ PLAN:    Diabetes type 2 requiring insulin, now poorly controlled  The patient's diabetes control  is much worse at least over the last month or so for no apparent reason Not clear if this is related to his stopping Amaryl in February as he thinks his blood sugars started increasing only last month Also has symptomatic hyperglycemia with weight loss and increased thirst As discussed in history of present illness in detail his fasting blood sugars are persistently high and averaging over 200 Also has sporadic very high readings in the afternoon which he cannot explain Again difficult  to assess blood sugars after supper since he is checking mostly in the morning or before supper Also he has not changed his insulin doses as  directed for persistently high fasting readings Overall compliant with diet and exercise regimen although his has some limitations on how active he can be   Recommendations:  Restart Amaryl at suppertime, 4 mg daily, new prescription sent  Stop taking Levemir in the morning and start taking it at suppertime again  Increase Levemir to 14 units for now  Review blood sugars in 3 weeks  More blood sugars after meals  He will increase his NovoLog by 2 units if blood sugar before eating is 200  Given written instructions for the insulin changes and blood sugar monitoring  Chronic kidney disease: Creatinine to be checked again, previously 1.3  Counseling time over 50% of today's 25 minute visit  Elayne Snare 12/12/2013, 11:14 AM

## 2013-12-12 NOTE — Patient Instructions (Signed)
Glimeperide 4mg  at dinner  Levemir AT SUPPER instead of am and go to 14   More sugars around 7-9 pm  If sugar is over 200 add additional 2 units of Novolog

## 2013-12-13 NOTE — Progress Notes (Signed)
Quick Note:  Urinalysis: No infection, A1c high at 9.2 ______

## 2013-12-15 DIAGNOSIS — D414 Neoplasm of uncertain behavior of bladder: Secondary | ICD-10-CM | POA: Diagnosis not present

## 2013-12-15 DIAGNOSIS — N281 Cyst of kidney, acquired: Secondary | ICD-10-CM | POA: Diagnosis not present

## 2013-12-15 DIAGNOSIS — R32 Unspecified urinary incontinence: Secondary | ICD-10-CM | POA: Diagnosis not present

## 2013-12-26 ENCOUNTER — Other Ambulatory Visit: Payer: Self-pay | Admitting: Endocrinology

## 2014-01-03 ENCOUNTER — Encounter: Payer: Self-pay | Admitting: Cardiovascular Disease

## 2014-01-04 ENCOUNTER — Encounter: Payer: Self-pay | Admitting: Endocrinology

## 2014-01-04 ENCOUNTER — Ambulatory Visit (INDEPENDENT_AMBULATORY_CARE_PROVIDER_SITE_OTHER): Payer: Medicare Other | Admitting: Family Medicine

## 2014-01-04 ENCOUNTER — Ambulatory Visit (INDEPENDENT_AMBULATORY_CARE_PROVIDER_SITE_OTHER): Payer: Medicare Other | Admitting: Endocrinology

## 2014-01-04 VITALS — BP 116/52 | HR 55 | Temp 97.8°F | Resp 14 | Ht 69.0 in | Wt 193.0 lb

## 2014-01-04 DIAGNOSIS — E1165 Type 2 diabetes mellitus with hyperglycemia: Principal | ICD-10-CM

## 2014-01-04 DIAGNOSIS — IMO0001 Reserved for inherently not codable concepts without codable children: Secondary | ICD-10-CM

## 2014-01-04 DIAGNOSIS — E538 Deficiency of other specified B group vitamins: Secondary | ICD-10-CM

## 2014-01-04 MED ORDER — CYANOCOBALAMIN 1000 MCG/ML IJ SOLN
1000.0000 ug | Freq: Once | INTRAMUSCULAR | Status: AC
  Administered 2014-01-04: 1000 ug via INTRAMUSCULAR

## 2014-01-04 NOTE — Patient Instructions (Signed)
More readings between 8-10 pm  Levemir 12 units in pm daily  Novolog 4 in am 4-6 at supper ( keep sugars after supper 120-170 range)

## 2014-01-04 NOTE — Progress Notes (Signed)
Patient ID: [name], male   DOB: March 01, 1929, 80 y.o.   MRN: [id number]   Reason for Appointment: Diabetes follow-up   History of Present Illness   Diagnosis: Type 2 DIABETES MELITUS, diagnoses 1972   He has had long-standing diabetes and has been on basal bolus insulin regimen after failure of oral hypoglycemic drugs over the last several years His blood sugars have been generally fairly well controlled but has a tendency to high postprandial readings Previously required only a small dose of 5 units of Levemir insulin   RECENT history:  His blood sugars were much higher than usual in 5/15 With restarting his Amaryl that was stopped previously his blood sugars have improved dramatically both morning and nonfasting Also his Levemir was changed to evening and the dose was increased by 4 units He has been asked to check his blood sugars more consistently but he has done only one reading after dinner last night which was high possibly from a larger meal Still compliant with his insulin doses at night and before meals as before and has not changed his diet Fasting glucose readings have been less variable but also has had readings in the 60s overnight and today was only 81 He thinks he is also getting a little more active   Oral hypoglycemic drugs: Metformin, 1500 mg          Side effects from medications: None Insulin regimen: Levemir 14 units at am, NovoLog 4-5 a.c before meals           Proper timing of medications in relation to meals: Yes.         Monitors blood glucose: Once a day.    Glucometer: One Touch.          Blood Glucose readings from meter download:   PREMEAL Breakfast Lunch Dinner Bedtime Overall  Glucose range:  56-164   81-145   60-206   193    Median:  121    129    115    Hypoglycemia: Glucose of 63 at 4 AM and 66 at 6:30 AM recently           Meals: 3 meals per day. Supper at 5-6 pm; breakfast is oatmeal with eggs. Drinks water mostly and no sweet drinks            Physical activity: exercise: Some walking and gardening, occasionally treadmill        Complications are: Minimal    Eye exam last 5/14  Wt Readings from Last 3 Encounters:  01/04/14 193 lb (87.544 kg)  12/12/13 176 lb (79.833 kg)  11/29/13 196 lb (88.905 kg)   Lab Results  Component Value Date   HGBA1C 9.2* 12/12/2013   HGBA1C 7.6* 09/14/2013   HGBA1C 7.8* 05/31/2013   Lab Results  Component Value Date   MICROALBUR 2.9* 09/14/2013   LDLCALC 63 12/12/2013   CREATININE 1.2 12/12/2013       Medication List       This list is accurate as of: 01/04/14  9:41 AM.  Always use your most recent med list.               aspirin 81 MG tablet  Take 81 mg by mouth daily.     atenolol 25 MG tablet  Commonly known as:  TENORMIN  Take 12.5-25 mg by mouth daily. 1 in the morning and one-half at bedtime     cholecalciferol 1000 UNITS tablet  Commonly known as:  VITAMIN  D  Take 1,000 Units by mouth daily.     docusate sodium 100 MG capsule  Commonly known as:  COLACE  Take 100 mg by mouth 2 (two) times daily.     ferrous gluconate 325 MG tablet  Commonly known as:  FERGON  Take 325 mg by mouth daily with breakfast.     glimepiride 4 MG tablet  Commonly known as:  AMARYL  Take 1 tablet (4 mg total) by mouth daily.     hydrochlorothiazide 25 MG tablet  Commonly known as:  HYDRODIURIL  Take 12.5 mg by mouth daily.     hyoscyamine 0.125 MG tablet  Commonly known as:  LEVSIN, ANASPAZ  Take 1 tablet (0.125 mg total) by mouth every 4 (four) hours as needed for cramping (bladder spasms).     insulin aspart 100 UNIT/ML injection  Commonly known as:  novoLOG  Inject 4-5 Units into the skin 3 (three) times daily before meals.     LEVEMIR FLEXPEN 100 UNIT/ML Pen  Generic drug:  Insulin Detemir  TAKE 14 UNITS SUBCUTANEOUS in am     loratadine 10 MG tablet  Commonly known as:  CLARITIN  Take 10 mg by mouth daily.     metFORMIN 1000 MG tablet  Commonly known as:  GLUCOPHAGE   Take 1,000 mg by mouth 2 (two) times daily with a meal. Take one tablet in am and 1/2 tablet at bedtime     omeprazole 20 MG capsule  Commonly known as:  PRILOSEC  Take 20 mg by mouth daily.     oxybutynin 10 MG 24 hr tablet  Commonly known as:  DITROPAN-XL     simvastatin 80 MG tablet  Commonly known as:  ZOCOR  Take 40 mg by mouth at bedtime.     tamsulosin 0.4 MG Caps capsule  Commonly known as:  FLOMAX  Take 0.4 mg by mouth daily after supper.     trandolapril 1 MG tablet  Commonly known as:  MAVIK  TAKE 1 TABLET EVERY DAY     VITAMIN B-12 IJ  Inject 1,000 mcg as directed every 21 ( twenty-one) days.        Allergies:  Allergies  Allergen Reactions  . Avodart [Dutasteride] Swelling  . Ibuprofen Nausea And Vomiting  . Morphine Nausea And Vomiting    Past Medical History  Diagnosis Date  . Diabetes mellitus   . Colon polyps   . BPH (benign prostatic hypertrophy)   . Osteoarthritis   . History of kidney stones   . Hypertension   . Hyperlipidemia   . B12 deficiency   . GERD (gastroesophageal reflux disease)     hiatal hernia    Past Surgical History  Procedure Laterality Date  . Appendectomy    . Partial amputaion left foot    . Knee cartilage surgery      Arthroscope  . Transurethral resection of prostate  06/17/2012    Procedure: TRANSURETHRAL RESECTION OF THE PROSTATE WITH GYRUS INSTRUMENTS;  Surgeon: Molli Hazard, MD;  Location: [facility];  Service: Urology;  Laterality: N/A;  . Cystoscopy with biopsy  06/17/2012    Procedure: CYSTOSCOPY WITH BIOPSY;  Surgeon: Molli Hazard, MD;  Location: [facility];  Service: Urology;  Laterality: N/A;  . Carotid doppler  01/27/2011    Right & Left ICAs 0-49% diameter reduction, right & left subclavian arteries demonstrated less than 50% diameter reduction.  . Cardiovascular stress test  09/02/2011    Post stress myocardial perfusion images  show normal pattern of perfusion in all areas. No significant wall  abnormalites noted.  . Transthoracic echocardiogram  09/02/2011    EF >55%, normal LV systolic function, mild diastolic dysfunction    Family History  Problem Relation Age of Onset  . Heart attack Mother   . Hypertension Mother     Social History:  reports that he has quit smoking. He has never used smokeless tobacco. He reports that he does not drink alcohol or use illicit drugs.  Review of Systems:  HYPERTENSION: mild and usually well controlled, periodically checking at home also  HYPERLIPIDEMIA:   Treated with high-dose simvastatin   Lab Results  Component Value Date   CHOL 127 12/12/2013   HDL 56.10 12/12/2013   LDLCALC 63 12/12/2013   TRIG 40.0 12/12/2013   CHOLHDL 2 12/12/2013        Examination:   BP 116/52  Pulse 55  Temp(Src) 97.8 F (36.6 C)  Resp 14  Ht 5\' 9"  (1.753 m)  Wt 193 lb (87.544 kg)  BMI 28.49 kg/m2  SpO2 97%  Body mass index is 28.49 kg/(m^2).   ASSESSMENT/ PLAN:    Diabetes type 2 requiring insulin without significant obesity  The patient's diabetes control  is much better with increasing his Levemir by 4 units as well as switching it to the evening Also has gone back and this may have helped overall control as well as reduced his fluctuation He does need more readings after supper as he had a high reading on his single reading last night  Overall compliant with diet and exercise regimen although his has some limitations on how active he can be   Recommendations:  Continue Amaryl 4 mg  decrease Levemir to 12units for now  Review blood sugars in  2 months   More blood sugars after evening meals  He will increase his  supper NovoLog by  1-2 units if blood sugar  after evening meals is consistently over 180   Given written instructions for the insulin changes and blood sugar monitoring   Levonne Carreras 01/04/2014, 9:41 AM

## 2014-01-05 ENCOUNTER — Telehealth: Payer: Self-pay | Admitting: Endocrinology

## 2014-01-05 NOTE — Telephone Encounter (Signed)
pts wife has questions regarding the levemir dosage she believes the AVS is incorrect

## 2014-01-05 NOTE — Telephone Encounter (Signed)
I instructed patients wife that her husband was only suppose to take 12 units of Levemir in the am, she understood.

## 2014-01-18 ENCOUNTER — Other Ambulatory Visit: Payer: Self-pay | Admitting: Family Medicine

## 2014-02-21 ENCOUNTER — Encounter: Payer: Self-pay | Admitting: Family Medicine

## 2014-02-21 ENCOUNTER — Ambulatory Visit (INDEPENDENT_AMBULATORY_CARE_PROVIDER_SITE_OTHER): Payer: Medicare Other | Admitting: Family Medicine

## 2014-02-21 VITALS — BP 110/58 | HR 60 | Temp 96.7°F | Resp 14 | Ht 69.0 in | Wt 195.0 lb

## 2014-02-21 DIAGNOSIS — R053 Chronic cough: Secondary | ICD-10-CM

## 2014-02-21 DIAGNOSIS — R05 Cough: Secondary | ICD-10-CM

## 2014-02-21 DIAGNOSIS — J3089 Other allergic rhinitis: Secondary | ICD-10-CM

## 2014-02-21 DIAGNOSIS — R059 Cough, unspecified: Secondary | ICD-10-CM

## 2014-02-21 MED ORDER — FLUTICASONE PROPIONATE 50 MCG/ACT NA SUSP: 2.0000 | Freq: Every day | NASAL | Status: DC

## 2014-02-21 NOTE — Progress Notes (Signed)
Subjective:    Patient ID: [name], male    DOB: 12-30-1928, 80 y.o.   MRN: [id number]  HPI  Patient presents with a chronic cough that has lasted at least 6 weeks and possibly 8-10 weeks according to his wife. He blames it on a constant rhinorrhea and postnasal drip. He denies any heartburn. He denies any sinus pain. He denies acid reflux. He does any fevers chills chest pain or pleurisy. He denies any hemoptysis. The cough is productive of clear sputum. Of note he is also taking Mavik. Past Medical History  Diagnosis Date  . Diabetes mellitus   . Colon polyps   . BPH (benign prostatic hypertrophy)   . Osteoarthritis   . History of kidney stones   . Hypertension   . Hyperlipidemia   . B12 deficiency   . GERD (gastroesophageal reflux disease)     hiatal hernia   Current Outpatient Prescriptions on File Prior to Visit  Medication Sig Dispense Refill  . aspirin 81 MG tablet Take 81 mg by mouth daily.      Marland Kitchen atenolol (TENORMIN) 25 MG tablet Take 12.5-25 mg by mouth daily. 1 in the morning and one-half at bedtime      . cholecalciferol (VITAMIN D) 1000 UNITS tablet Take 1,000 Units by mouth daily.      . Cyanocobalamin (VITAMIN B-12 IJ) Inject 1,000 mcg as directed every 21 ( twenty-one) days.        Marland Kitchen docusate sodium (COLACE) 100 MG capsule Take 100 mg by mouth 2 (two) times daily.      . ferrous gluconate (FERGON) 325 MG tablet Take 325 mg by mouth daily with breakfast.      . glimepiride (AMARYL) 4 MG tablet Take 1 tablet (4 mg total) by mouth daily.  30 tablet  2  . hydrochlorothiazide 25 MG tablet Take 12.5 mg by mouth daily.       . hyoscyamine (LEVSIN, ANASPAZ) 0.125 MG tablet Take 1 tablet (0.125 mg total) by mouth every 4 (four) hours as needed for cramping (bladder spasms).  40 tablet  4  . insulin aspart (NOVOLOG) 100 UNIT/ML injection Inject 4-5 Units into the skin 3 (three) times daily before meals.  15 pen  5  . Insulin Detemir (LEVEMIR FLEXPEN) 100 UNIT/ML SOPN  TAKE 14 UNITS SUBCUTANEOUS in PM      . loratadine (CLARITIN) 10 MG tablet Take 10 mg by mouth daily.        . metFORMIN (GLUCOPHAGE) 1000 MG tablet Take 1,000 mg by mouth 2 (two) times daily with a meal. Take one tablet in am and 1/2 tablet at bedtime      . omeprazole (PRILOSEC) 20 MG capsule Take 20 mg by mouth daily.        Marland Kitchen oxybutynin (DITROPAN-XL) 10 MG 24 hr tablet       . simvastatin (ZOCOR) 80 MG tablet Take 40 mg by mouth at bedtime.      . Tamsulosin HCl (FLOMAX) 0.4 MG CAPS Take 0.4 mg by mouth daily after supper.      . trandolapril (MAVIK) 1 MG tablet TAKE 1 TABLET EVERY DAY  90 tablet  3   No current facility-administered medications on file prior to visit.   Allergies  Allergen Reactions  . Avodart [Dutasteride] Swelling  . Ibuprofen Nausea And Vomiting  . Morphine Nausea And Vomiting   History   Social History  . Marital Status: Married    Spouse Name: N/A  Number of Children: N/A  . Years of Education: N/A   Occupational History  . Not on file.   Social History Main Topics  . Smoking status: Former Research scientist (life sciences)  . Smokeless tobacco: Never Used  . Alcohol Use: No  . Drug Use: No  . Sexual Activity: Not on file     Comment: married to [place], retired.   Other Topics Concern  . Not on file   Social History Narrative  . No narrative on file      Review of Systems  All other systems reviewed and are negative.      Objective:   Physical Exam  Vitals reviewed. Constitutional: He appears well-developed and well-nourished. No distress.  HENT:  Nose: Mucosal edema and rhinorrhea present.  Cardiovascular: Normal rate, regular rhythm and normal heart sounds.   Pulmonary/Chest: Effort normal and breath sounds normal. No respiratory distress. He has no wheezes. He has no rales.  Abdominal: Soft. Bowel sounds are normal.  Skin: He is not diaphoretic.          Assessment & Plan:  1. Other allergic rhinitis - fluticasone (FLONASE) 50 MCG/ACT nasal  spray; Place 2 sprays into both nostrils daily.  Dispense: 16 g; Refill: 6  2. Chronic cough  Temporarily discontinue Mavik. Begin the patient on Flonase 2 sprays each nostril daily for chronic postnasal drip. Reassess the cough in 2 weeks. If the cough persists, I recommended chest x-ray as well as possibly adding astelin.

## 2014-02-22 DIAGNOSIS — E119 Type 2 diabetes mellitus without complications: Secondary | ICD-10-CM | POA: Diagnosis not present

## 2014-02-22 DIAGNOSIS — IMO0002 Reserved for concepts with insufficient information to code with codable children: Secondary | ICD-10-CM | POA: Diagnosis not present

## 2014-02-22 DIAGNOSIS — D485 Neoplasm of uncertain behavior of skin: Secondary | ICD-10-CM | POA: Diagnosis not present

## 2014-02-22 DIAGNOSIS — Z961 Presence of intraocular lens: Secondary | ICD-10-CM | POA: Diagnosis not present

## 2014-02-28 ENCOUNTER — Encounter: Payer: Self-pay | Admitting: Gastroenterology

## 2014-03-03 ENCOUNTER — Ambulatory Visit (INDEPENDENT_AMBULATORY_CARE_PROVIDER_SITE_OTHER): Payer: Medicare Other | Admitting: *Deleted

## 2014-03-03 DIAGNOSIS — E538 Deficiency of other specified B group vitamins: Secondary | ICD-10-CM

## 2014-03-03 MED ORDER — CYANOCOBALAMIN 1000 MCG/ML IJ SOLN: 1000.0000 ug | Freq: Once | INTRAMUSCULAR | Status: DC

## 2014-03-03 MED ORDER — IPRATROPIUM-ALBUTEROL 0.5-2.5 (3) MG/3ML IN SOLN: 3.0000 mL | Freq: Once | RESPIRATORY_TRACT | Status: DC

## 2014-03-03 MED ORDER — CYANOCOBALAMIN 1000 MCG/ML IJ SOLN
1000.0000 ug | Freq: Once | INTRAMUSCULAR | Status: AC
  Administered 2014-03-03: 1000 ug via INTRAMUSCULAR

## 2014-03-06 ENCOUNTER — Encounter: Payer: Self-pay | Admitting: Endocrinology

## 2014-03-06 ENCOUNTER — Ambulatory Visit (INDEPENDENT_AMBULATORY_CARE_PROVIDER_SITE_OTHER): Payer: Medicare Other | Admitting: Endocrinology

## 2014-03-06 ENCOUNTER — Other Ambulatory Visit: Payer: Self-pay | Admitting: Endocrinology

## 2014-03-06 VITALS — BP 118/54 | HR 54 | Temp 98.0°F | Resp 16 | Ht 69.0 in | Wt 196.0 lb

## 2014-03-06 DIAGNOSIS — E7800 Pure hypercholesterolemia, unspecified: Secondary | ICD-10-CM | POA: Diagnosis not present

## 2014-03-06 DIAGNOSIS — E1165 Type 2 diabetes mellitus with hyperglycemia: Principal | ICD-10-CM

## 2014-03-06 DIAGNOSIS — I10 Essential (primary) hypertension: Secondary | ICD-10-CM | POA: Diagnosis not present

## 2014-03-06 DIAGNOSIS — E785 Hyperlipidemia, unspecified: Secondary | ICD-10-CM | POA: Diagnosis not present

## 2014-03-06 DIAGNOSIS — IMO0001 Reserved for inherently not codable concepts without codable children: Secondary | ICD-10-CM

## 2014-03-06 LAB — COMPREHENSIVE METABOLIC PANEL
ALBUMIN: 3.9 g/dL (ref 3.5–5.2)
ALT: 19 U/L (ref 0–53)
AST: 22 U/L (ref 0–37)
Alkaline Phosphatase: 76 U/L (ref 39–117)
BUN: 28 mg/dL — ABNORMAL HIGH (ref 6–23)
CALCIUM: 9 mg/dL (ref 8.4–10.5)
CHLORIDE: 106 meq/L (ref 96–112)
CO2: 24 mEq/L (ref 19–32)
Creatinine, Ser: 1.2 mg/dL (ref 0.4–1.5)
GFR: 62.07 mL/min (ref >60.00)
Glucose, Bld: 176 mg/dL — ABNORMAL HIGH (ref 70–99)
POTASSIUM: 4.2 meq/L (ref 3.5–5.1)
Sodium: 139 mEq/L (ref 135–145)
TOTAL PROTEIN: 6.9 g/dL (ref 6.0–8.3)
Total Bilirubin: 0.6 mg/dL (ref 0.2–1.2)

## 2014-03-06 LAB — MICROALBUMIN / CREATININE URINE RATIO
Creatinine,U: 143.2 mg/dL
Microalb Creat Ratio: 0.1 mg/g (ref 0.0–30.0)
Microalb, Ur: 0.2 mg/dL (ref 0.0–1.9)

## 2014-03-06 LAB — HEMOGLOBIN A1C: HEMOGLOBIN A1C: 7 % — AB (ref 4.6–6.5)

## 2014-03-06 NOTE — Patient Instructions (Signed)
Levemir 11 units at dinner  Please check blood sugars at least half the time about 2 hours after any meal and 4 times per week on waking up.  Please bring blood sugar monitor to each visit  If planning to be very active reduce Novolog 1-2 units before the meal  Hold pen in skin 10 sec after pushing in insulin  Use only 6 oz Coke and no extra food when low

## 2014-03-06 NOTE — Progress Notes (Signed)
Patient ID: [name], male   DOB: 05-17-1929, 80 y.o.   MRN: [id number]   Reason for Appointment: Diabetes follow-up   History of Present Illness   Diagnosis: Type 2 DIABETES MELITUS, diagnoses 1972   He has had long-standing diabetes and has been on basal bolus insulin regimen after failure of oral hypoglycemic drugs over the last several years His blood sugars have been generally fairly well controlled but has a tendency to high postprandial readings Previously required only a small dose of 5 units of Levemir insulin   RECENT history:  His blood sugars were previously much higher than usual in 5/15 With restarting his Amaryl  his blood sugars have improved both morning and nonfasting Also his Levemir was changed to evening and the dose was increased initially but reduced back to 12 units in 6/15   Fasting glucose readings have been excellent without any low sugars although he thinks a month or 2 ago he may have had a low sugar during the night He has been asked to check his blood sugars after meals more consistently but  has done blood sugars mostly before eating during the day  was high possibly from a larger meal Still compliant with his insulin doses  before meals as before and the Levemir at suppertime He says that his Levemir will have a drop of insulin when he withdraws a needle after injection, usually using his midabdomen and morning the pen for about 5 seconds after the injection Hypoglycemia: Has had rare episodes, once before supper but he is over treating this with glucose tablets, 8 ounces of Coke and a lot of food causing a rebound. He does think he takes his insulin at mealtimes even if blood sugar is low normal, lowest recorded blood sugar is 71   Oral hypoglycemic drugs: Metformin, 1500 mg , Amaryl         Side effects from medications: None Insulin regimen: Levemir 12 units at 5pm, NovoLog 4-5 a.c before meals           Proper timing of medications in relation to  meals: Yes.         Monitors blood glucose: Once a day.    Glucometer: One Touch.          Blood Glucose readings from meter download:   PREMEAL Breakfast Lunch Dinner Bedtime Overall  Glucose range: 83-143 109-130 71-181  278    Mean/median:  106   134  109            Meals: 3 meals per day. Supper at 5-6 pm; breakfast is oatmeal with eggs. Drinks water mostly and no sweet drinks           Physical activity: exercise: Some walking and gardening,  using recumbent bike       Complications are: Minimal    Eye exam last 7/15, no retinopathy reported   Wt Readings from Last 3 Encounters:  03/06/14 196 lb (88.905 kg)  02/21/14 195 lb (88.451 kg)  01/04/14 193 lb (87.544 kg)   Lab Results  Component Value Date   HGBA1C 9.2* 12/12/2013   HGBA1C 7.6* 09/14/2013   HGBA1C 7.8* 05/31/2013   Lab Results  Component Value Date   MICROALBUR 2.9* 09/14/2013   LDLCALC 63 12/12/2013   CREATININE 1.2 12/12/2013       Medication List       This list is accurate as of: 03/06/14 10:36 AM.  Always use your most recent med list.  aspirin 81 MG tablet  Take 81 mg by mouth daily.     atenolol 25 MG tablet  Commonly known as:  TENORMIN  Take 12.5-25 mg by mouth daily. 1 in the morning and one-half at bedtime     cholecalciferol 1000 UNITS tablet  Commonly known as:  VITAMIN D  Take 1,000 Units by mouth daily.     docusate sodium 100 MG capsule  Commonly known as:  COLACE  Take 100 mg by mouth 2 (two) times daily.     ferrous gluconate 325 MG tablet  Commonly known as:  FERGON  Take 325 mg by mouth daily with breakfast.     fluticasone 50 MCG/ACT nasal spray  Commonly known as:  FLONASE  Place 2 sprays into both nostrils daily.     glimepiride 4 MG tablet  Commonly known as:  AMARYL  Take 1 tablet (4 mg total) by mouth daily.     hydrochlorothiazide 25 MG tablet  Commonly known as:  HYDRODIURIL  Take 12.5 mg by mouth daily.     hyoscyamine 0.125 MG tablet   Commonly known as:  LEVSIN, ANASPAZ  Take 1 tablet (0.125 mg total) by mouth every 4 (four) hours as needed for cramping (bladder spasms).     insulin aspart 100 UNIT/ML injection  Commonly known as:  novoLOG  Inject 4-5 Units into the skin 3 (three) times daily before meals.     LEVEMIR FLEXPEN 100 UNIT/ML Pen  Generic drug:  Insulin Detemir  TAKE 14 UNITS SUBCUTANEOUS in PM     loratadine 10 MG tablet  Commonly known as:  CLARITIN  Take 10 mg by mouth daily.     metFORMIN 1000 MG tablet  Commonly known as:  GLUCOPHAGE  Take 1,000 mg by mouth 2 (two) times daily with a meal. Take one tablet in am and 1/2 tablet at bedtime     omeprazole 20 MG capsule  Commonly known as:  PRILOSEC  Take 20 mg by mouth daily.     oxybutynin 10 MG 24 hr tablet  Commonly known as:  DITROPAN-XL     simvastatin 80 MG tablet  Commonly known as:  ZOCOR  Take 40 mg by mouth at bedtime.     tamsulosin 0.4 MG Caps capsule  Commonly known as:  FLOMAX  Take 0.4 mg by mouth daily after supper.     trandolapril 1 MG tablet  Commonly known as:  MAVIK  TAKE 1 TABLET EVERY DAY     VITAMIN B-12 IJ  Inject 1,000 mcg as directed every 21 ( twenty-one) days.        Allergies:  Allergies  Allergen Reactions  . Avodart [Dutasteride] Swelling  . Ibuprofen Nausea And Vomiting  . Morphine Nausea And Vomiting    Past Medical History  Diagnosis Date  . Diabetes mellitus   . Colon polyps   . BPH (benign prostatic hypertrophy)   . Osteoarthritis   . History of kidney stones   . Hypertension   . Hyperlipidemia   . B12 deficiency   . GERD (gastroesophageal reflux disease)     hiatal hernia    Past Surgical History  Procedure Laterality Date  . Appendectomy    . Partial amputaion left foot    . Knee cartilage surgery      Arthroscope  . Transurethral resection of prostate  06/17/2012    Procedure: TRANSURETHRAL RESECTION OF THE PROSTATE WITH GYRUS INSTRUMENTS;  Surgeon: Molli Hazard, MD;  Location: [facility];  Service: Urology;  Laterality: N/A;  . Cystoscopy with biopsy  06/17/2012    Procedure: CYSTOSCOPY WITH BIOPSY;  Surgeon: Molli Hazard, MD;  Location: [facility];  Service: Urology;  Laterality: N/A;  . Carotid doppler  01/27/2011    Right & Left ICAs 0-49% diameter reduction, right & left subclavian arteries demonstrated less than 50% diameter reduction.  . Cardiovascular stress test  09/02/2011    Post stress myocardial perfusion images show normal pattern of perfusion in all areas. No significant wall abnormalites noted.  . Transthoracic echocardiogram  09/02/2011    EF >55%, normal LV systolic function, mild diastolic dysfunction    Family History  Problem Relation Age of Onset  . Heart attack Mother   . Hypertension Mother     Social History:  reports that he has quit smoking. He has never used smokeless tobacco. He reports that he does not drink alcohol or use illicit drugs.  Review of Systems:  HYPERTENSION: mild and usually well controlled, periodically checking at home also, off Mavik Recently because of possible cough from this  HYPERLIPIDEMIA:   Treated with high-dose simvastatin  with good control  Lab Results  Component Value Date   CHOL 127 12/12/2013   HDL 56.10 12/12/2013   LDLCALC 63 12/12/2013   TRIG 40.0 12/12/2013   CHOLHDL 2 12/12/2013        Examination:   BP 118/54  Pulse 54  Temp(Src) 98 F (36.7 C)  Resp 16  Ht 5\' 9"  (1.753 m)  Wt 196 lb (88.905 kg)  BMI 28.93 kg/m2  SpO2 97%  Body mass index is 28.93 kg/(m^2).   ASSESSMENT/ PLAN:    Diabetes type 2 requiring insulin without significant obesity  The patient's diabetes control  is much better with  Amaryl along with basal bolus insulin Blood sugars are still excellent in the morning despite reducing Levemir on his last visit in June However he has a few low normal readings in the mornings and reports one episode of nocturnal hypoglycemia which is not  documented Blood sugars may be relatively higher at suppertime but not consistent. Not clear what his readings are after meals as he is not doing any despite reminders See history of present illness for current blood sugar patterns and management as well as problems identified   Recommendations:  Reduce Levemir by one unit  More readings after meals  Discussed day-to-day management of diet, exercise and insulin in detail  Check A1c today  Hypertension: Well controlled despite recently holding his Mavik, no history of microalbuminuria  Patient Instructions  Levemir 11 units at dinner  Please check blood sugars at least half the time about 2 hours after any meal and 4 times per week on waking up.  Please bring blood sugar monitor to each visit  If planning to be very active reduce Novolog 1-2 units before the meal  Hold pen in skin 10 sec after pushing in insulin  Use only 6 oz Coke and no extra food when low    Counseling time over 50% of today's 25 minute visit   Ardell Aaronson 03/06/2014, 10:36 AM

## 2014-03-07 ENCOUNTER — Ambulatory Visit (INDEPENDENT_AMBULATORY_CARE_PROVIDER_SITE_OTHER): Payer: Medicare Other

## 2014-03-07 ENCOUNTER — Other Ambulatory Visit: Payer: Self-pay | Admitting: *Deleted

## 2014-03-07 DIAGNOSIS — E1142 Type 2 diabetes mellitus with diabetic polyneuropathy: Secondary | ICD-10-CM

## 2014-03-07 DIAGNOSIS — Q828 Other specified congenital malformations of skin: Secondary | ICD-10-CM | POA: Diagnosis not present

## 2014-03-07 DIAGNOSIS — L608 Other nail disorders: Secondary | ICD-10-CM | POA: Diagnosis not present

## 2014-03-07 DIAGNOSIS — E1149 Type 2 diabetes mellitus with other diabetic neurological complication: Secondary | ICD-10-CM

## 2014-03-07 DIAGNOSIS — E1140 Type 2 diabetes mellitus with diabetic neuropathy, unspecified: Secondary | ICD-10-CM

## 2014-03-07 MED ORDER — INSULIN PEN NEEDLE 31G X 5 MM MISC: Status: DC

## 2014-03-07 NOTE — Patient Instructions (Signed)
Diabetes and Foot Care Diabetes may cause you to have problems because of poor blood supply (circulation) to your feet and legs. This may cause the skin on your feet to become thinner, break easier, and heal more slowly. Your skin may become dry, and the skin may peel and crack. You may also have nerve damage in your legs and feet causing decreased feeling in them. You may not notice minor injuries to your feet that could lead to infections or more serious problems. Taking care of your feet is one of the most important things you can do for yourself.  HOME CARE INSTRUCTIONS  Wear shoes at all times, even in the house. Do not go barefoot. Bare feet are easily injured.  Check your feet daily for blisters, cuts, and redness. If you cannot see the bottom of your feet, use a mirror or ask someone for help.  Wash your feet with warm water (do not use hot water) and mild soap. Then pat your feet and the areas between your toes until they are completely dry. Do not soak your feet as this can dry your skin.  Apply a moisturizing lotion or petroleum jelly (that does not contain alcohol and is unscented) to the skin on your feet and to dry, brittle toenails. Do not apply lotion between your toes.  Trim your toenails straight across. Do not dig under them or around the cuticle. File the edges of your nails with an emery board or nail file.  Do not cut corns or calluses or try to remove them with medicine.  Wear clean socks or stockings every day. Make sure they are not too tight. Do not wear knee-high stockings since they may decrease blood flow to your legs.  Wear shoes that fit properly and have enough cushioning. To break in new shoes, wear them for just a few hours a day. This prevents you from injuring your feet. Always look in your shoes before you put them on to be sure there are no objects inside.  Do not cross your legs. This may decrease the blood flow to your feet.  If you find a minor scrape,  cut, or break in the skin on your feet, keep it and the skin around it clean and dry. These areas may be cleansed with mild soap and water. Do not cleanse the area with peroxide, alcohol, or iodine.  When you remove an adhesive bandage, be sure not to damage the skin around it.  If you have a wound, look at it several times a day to make sure it is healing.  Do not use heating pads or hot water bottles. They may burn your skin. If you have lost feeling in your feet or legs, you may not know it is happening until it is too late.  Make sure your health care provider performs a complete foot exam at least annually or more often if you have foot problems. Report any cuts, sores, or bruises to your health care provider immediately. SEEK MEDICAL CARE IF:   You have an injury that is not healing.  You have cuts or breaks in the skin.  You have an ingrown nail.  You notice redness on your legs or feet.  You feel burning or tingling in your legs or feet.  You have pain or cramps in your legs and feet.  Your legs or feet are numb.  Your feet always feel cold. SEEK IMMEDIATE MEDICAL CARE IF:   There is increasing redness,   swelling, or pain in or around a wound.  There is a red line that goes up your leg.  Pus is coming from a wound.  You develop a fever or as directed by your health care provider.  You notice a bad smell coming from an ulcer or wound. Document Released: 07/11/2000 Document Revised: 03/16/2013 Document Reviewed: 12/21/2012 [employer] Patient Information 2015 [employer]. This information is not intended to replace advice given to you by your health care provider. Make sure you discuss any questions you have with your health care provider.  

## 2014-03-07 NOTE — Progress Notes (Signed)
Quick Note:  Please let patient know that the A1c result is the best in a while at 7%, to check more readings after meals as glucose was 176 ______

## 2014-03-07 NOTE — Progress Notes (Signed)
   Subjective:    Patient ID: [name], male    DOB: February 26, 1929, 80 y.o.   MRN: [id number]  HPI Comments: Pt presents for debridement of right 1- 5 and left 3 - 5 toenails, and callous to the left 1st toe plantar.     Review of Systems no new findings or systemic changes noted     Objective:   Physical Exam Lower extremity vascular status is intact with pedal pulses palpable DP +2/4 bilateral PT one over 4 bilateral capillary refill time 3 seconds epicritic and proprioceptive sensations diminished on Semmes Weinstein testing to forefoot digits has a partial amputation of left hallux with keratoses sub-hallux IP joint also pinch callus of the right first MTP area noted as well nails thick brittle crumbly friable dystrophic 1 through 5 right 2 through 5 left no open wounds or ulcerations noted current time keratosis again sub-hallux IP joint but left and MTP joints bilateral. No secondary infections       Assessment & Plan:  Assessment this time history of diabetes and complications dystrophic friable gratified thick nails debridement presence of diabetes nails debrided x9 also multiple keratoses debrided of hallux MTP joint bilateral and IP joint left hallux on debridement the left hallux with lumicain and Neosporin and Band-Aid also some lumicain Neosporin applied to the third toe left following debridement recheck in 3 months for continued palliative care in the future as needed next  [employer] DPM

## 2014-03-09 ENCOUNTER — Encounter: Payer: Self-pay | Admitting: *Deleted

## 2014-03-16 DIAGNOSIS — D485 Neoplasm of uncertain behavior of skin: Secondary | ICD-10-CM | POA: Diagnosis not present

## 2014-03-16 DIAGNOSIS — C44111 Basal cell carcinoma of skin of unspecified eyelid, including canthus: Secondary | ICD-10-CM | POA: Diagnosis not present

## 2014-03-29 DIAGNOSIS — Z96649 Presence of unspecified artificial hip joint: Secondary | ICD-10-CM | POA: Diagnosis not present

## 2014-04-20 ENCOUNTER — Ambulatory Visit (INDEPENDENT_AMBULATORY_CARE_PROVIDER_SITE_OTHER): Payer: Medicare Other | Admitting: Family Medicine

## 2014-04-20 DIAGNOSIS — E538 Deficiency of other specified B group vitamins: Secondary | ICD-10-CM | POA: Diagnosis not present

## 2014-04-20 MED ORDER — CYANOCOBALAMIN 1000 MCG/ML IJ SOLN
1000.0000 ug | Freq: Once | INTRAMUSCULAR | Status: AC
  Administered 2014-04-20: 1000 ug via INTRAMUSCULAR

## 2014-05-11 DIAGNOSIS — C44112 Basal cell carcinoma of skin of right eyelid, including canthus: Secondary | ICD-10-CM | POA: Diagnosis not present

## 2014-05-25 ENCOUNTER — Ambulatory Visit (INDEPENDENT_AMBULATORY_CARE_PROVIDER_SITE_OTHER): Payer: Medicare Other | Admitting: *Deleted

## 2014-05-25 DIAGNOSIS — Z23 Encounter for immunization: Secondary | ICD-10-CM | POA: Diagnosis not present

## 2014-05-25 NOTE — Progress Notes (Signed)
Patient ID: [name], male   DOB: 08/03/1928, 81 y.o.   MRN: [id number] Patient seen in office for Influenza Vaccination.   Tolerated IM administration well.

## 2014-06-01 ENCOUNTER — Other Ambulatory Visit: Payer: Self-pay | Admitting: Endocrinology

## 2014-06-06 ENCOUNTER — Ambulatory Visit: Payer: Medicare Other

## 2014-06-06 ENCOUNTER — Ambulatory Visit: Payer: Medicare Other | Admitting: Endocrinology

## 2014-06-13 ENCOUNTER — Encounter: Payer: Self-pay | Admitting: Endocrinology

## 2014-06-13 ENCOUNTER — Ambulatory Visit (INDEPENDENT_AMBULATORY_CARE_PROVIDER_SITE_OTHER): Payer: Medicare Other | Admitting: Endocrinology

## 2014-06-13 ENCOUNTER — Ambulatory Visit (INDEPENDENT_AMBULATORY_CARE_PROVIDER_SITE_OTHER): Payer: Medicare Other

## 2014-06-13 VITALS — BP 118/64 | HR 58 | Temp 98.0°F | Resp 14 | Ht 69.0 in | Wt 198.8 lb

## 2014-06-13 DIAGNOSIS — E1140 Type 2 diabetes mellitus with diabetic neuropathy, unspecified: Secondary | ICD-10-CM

## 2014-06-13 DIAGNOSIS — B351 Tinea unguium: Secondary | ICD-10-CM

## 2014-06-13 DIAGNOSIS — E1165 Type 2 diabetes mellitus with hyperglycemia: Secondary | ICD-10-CM

## 2014-06-13 DIAGNOSIS — IMO0002 Reserved for concepts with insufficient information to code with codable children: Secondary | ICD-10-CM

## 2014-06-13 DIAGNOSIS — Q828 Other specified congenital malformations of skin: Secondary | ICD-10-CM | POA: Diagnosis not present

## 2014-06-13 DIAGNOSIS — M79673 Pain in unspecified foot: Secondary | ICD-10-CM

## 2014-06-13 LAB — HEMOGLOBIN A1C: HEMOGLOBIN A1C: 7.7 % — AB (ref 4.6–6.5)

## 2014-06-13 NOTE — Progress Notes (Signed)
Patient ID: [name], male   DOB: [birth year], 80 y.o.   MRN: [id number]   Reason for Appointment: Diabetes follow-up   History of Present Illness   Diagnosis: Type 2 DIABETES MELITUS, diagnoses 1972   He has had long-standing diabetes and has been on basal bolus insulin regimen after failure of oral hypoglycemic drugs over the last several years His blood sugars have been generally fairly well controlled but has a tendency to high postprandial readings Previously required only a small dose of 5 units of Levemir insulin   RECENT history:  Although with restarting his Amaryl  his blood sugars had improved his blood sugars appear to be higher again especially in the evenings and he does not know why Highest blood sugar has been 359 at bedtime Because low normal fasting readings his Levemir was reduced by one unit He has not checked many readings after evening meal and has not increased his NovoLog despite readings over 200 frequently Overall he has been less active but has not gained any weight  Hypoglycemia: none recently   Oral hypoglycemic drugs: Metformin, 1500 mg , Amaryl         Side effects from medications: None Insulin regimen: Levemir 11 units at 5pm, NovoLog 5 a.c before meals            Proper timing of insulin in relation to meals: Yes.         Monitors blood glucose: 1.4 times a day.    Glucometer: One Touch.          Blood Glucose readings from meter download:   PREMEAL Breakfast Lunch PCL PCS Overall  Glucose range: 104-263 88-154 110-183 245-359   Mean/median: 160    165   Meals: 3 meals per day. Supper at 5-6 pm; breakfast is oatmeal with eggs. Drinks water mostly and no sweet drinks           Physical activity: exercise: less walkingrecently,        Complications are: Minimal    Eye exam last 7/15, no retinopathy reported   Wt Readings from Last 3 Encounters:  06/13/14 198 lb 12.8 oz (90.175 kg)  03/06/14 196 lb (88.905 kg)  02/21/14 195 lb (88.451 kg)    Lab Results  Component Value Date   HGBA1C 7.7* 06/13/2014   HGBA1C 7.0* 03/06/2014   HGBA1C 9.2* 12/12/2013   Lab Results  Component Value Date   MICROALBUR 0.2 03/06/2014   LDLCALC 63 12/12/2013   CREATININE 1.3 06/13/2014       Medication List       This list is accurate as of: 06/13/14 11:59 PM.  Always use your most recent med list.               aspirin 81 MG tablet  Take 81 mg by mouth daily.     atenolol 25 MG tablet  Commonly known as:  TENORMIN  Take 12.5-25 mg by mouth daily. 1 in the morning and one-half at bedtime     cholecalciferol 1000 UNITS tablet  Commonly known as:  VITAMIN D  Take 1,000 Units by mouth daily.     docusate sodium 100 MG capsule  Commonly known as:  COLACE  Take 100 mg by mouth 2 (two) times daily.     ferrous gluconate 325 MG tablet  Commonly known as:  FERGON  Take 325 mg by mouth daily with breakfast.     fluticasone 50 MCG/ACT nasal spray  Commonly known as:  FLONASE  Place 2 sprays into both nostrils daily.     glimepiride 4 MG tablet  Commonly known as:  AMARYL  TAKE 1 TABLET (4 MG TOTAL) BY MOUTH DAILY.     hydrochlorothiazide 25 MG tablet  Commonly known as:  HYDRODIURIL  Take 12.5 mg by mouth daily.     hyoscyamine 0.125 MG tablet  Commonly known as:  LEVSIN, ANASPAZ  Take 1 tablet (0.125 mg total) by mouth every 4 (four) hours as needed for cramping (bladder spasms).     insulin aspart 100 UNIT/ML injection  Commonly known as:  novoLOG  Inject 4-5 Units into the skin 3 (three) times daily before meals.     Insulin Pen Needle 31G X 5 MM Misc  Commonly known as:  B-D UF III MINI PEN NEEDLES  Use 4 times per day     LEVEMIR FLEXPEN 100 UNIT/ML Pen  Generic drug:  Insulin Detemir  TAKE 14 UNITS SUBCUTANEOUS in PM     loratadine 10 MG tablet  Commonly known as:  CLARITIN  Take 10 mg by mouth daily.     metFORMIN 1000 MG tablet  Commonly known as:  GLUCOPHAGE  Take 1,000 mg by mouth 2 (two) times  daily with a meal. Take one tablet in am and 1/2 tablet at bedtime     omeprazole 20 MG capsule  Commonly known as:  PRILOSEC  Take 20 mg by mouth daily.     oxybutynin 10 MG 24 hr tablet  Commonly known as:  DITROPAN-XL     simvastatin 80 MG tablet  Commonly known as:  ZOCOR  Take 40 mg by mouth at bedtime.     tamsulosin 0.4 MG Caps capsule  Commonly known as:  FLOMAX  Take 0.4 mg by mouth daily after supper.     trandolapril 1 MG tablet  Commonly known as:  MAVIK  TAKE 1 TABLET EVERY DAY     VITAMIN B-12 IJ  Inject 1,000 mcg as directed every 21 ( twenty-one) days.        Allergies:  Allergies  Allergen Reactions  . Avodart [Dutasteride] Swelling  . Ibuprofen Nausea And Vomiting  . Morphine Nausea And Vomiting    Past Medical History  Diagnosis Date  . Diabetes mellitus   . Colon polyps   . BPH (benign prostatic hypertrophy)   . Osteoarthritis   . History of kidney stones   . Hypertension   . Hyperlipidemia   . B12 deficiency   . GERD (gastroesophageal reflux disease)     hiatal hernia    Past Surgical History  Procedure Laterality Date  . Appendectomy    . Partial amputaion left foot    . Knee cartilage surgery      Arthroscope  . Transurethral resection of prostate  06/17/2012    Procedure: TRANSURETHRAL RESECTION OF THE PROSTATE WITH GYRUS INSTRUMENTS;  Surgeon: Molli Hazard, MD;  Location: [facility];  Service: Urology;  Laterality: N/A;  . Cystoscopy with biopsy  06/17/2012    Procedure: CYSTOSCOPY WITH BIOPSY;  Surgeon: Molli Hazard, MD;  Location: [facility];  Service: Urology;  Laterality: N/A;  . Carotid doppler  01/27/2011    Right & Left ICAs 0-49% diameter reduction, right & left subclavian arteries demonstrated less than 50% diameter reduction.  . Cardiovascular stress test  09/02/2011    Post stress myocardial perfusion images show normal pattern of perfusion in all areas. No significant wall abnormalites noted.  . Transthoracic  echocardiogram  09/02/2011    EF >55%, normal LV systolic function, mild diastolic dysfunction    Family History  Problem Relation Age of Onset  . Heart attack Mother   . Hypertension Mother     Social History:  reports that he has quit smoking. He has never used smokeless tobacco. He reports that he does not drink alcohol or use illicit drugs.  Review of Systems:  HYPERTENSION: mild and usually well controlled, periodically checking at home also  HYPERLIPIDEMIA:   Treated with high-dose simvastatin  with good control  Lab Results  Component Value Date   CHOL 127 12/12/2013   HDL 56.10 12/12/2013   LDLCALC 63 12/12/2013   TRIG 40.0 12/12/2013   CHOLHDL 2 12/12/2013        Examination:   BP 118/64 mmHg  Pulse 58  Temp(Src) 98 F (36.7 C)  Resp 14  Ht 5\' 9"  (1.753 m)  Wt 198 lb 12.8 oz (90.175 kg)  BMI 29.34 kg/m2  SpO2 96%  Body mass index is 29.34 kg/(m^2).   ASSESSMENT/ PLAN:    Diabetes type 2 requiring insulin without significant obesity  The patient's diabetes control  is not as good compared to his last visit but not clear the reason since he has not changed his day-to-day management or lifestyle He does less exercise than before  Recently most of his high readings are after supper and this [name] be getting lower overnight He previously did benefit from adding Amaryl in the morning   Recommendations:  Increase Levemir if morning sugar continues to be high consistently  More readings after meals in the evening  Increase suppertime NovoLog by at least 2 units    Patient Instructions  Increase Novolog at supper to 7 units unless eating small meal [name] reduce to 5   If sugars in am stay over 140 go up 1 unit on Lememir (12)     [name] 06/14/2014, 12:00 PM    Addendum: Labs as follows, A1c is relatively higher as expected  Office Visit on 06/13/2014  Component Date Value Ref Range Status  . Hgb A1c MFr Bld 06/13/2014 7.7* 4.6 - 6.5 % Final    Glycemic Control Guidelines for People with Diabetes:Non Diabetic:  <6%Goal of Therapy: <7%Additional Action Suggested:  >8%   . Sodium 06/13/2014 141  135 - 145 mEq/L Final  . Potassium 06/13/2014 4.2  3.5 - 5.1 mEq/L Final  . Chloride 06/13/2014 108  96 - 112 mEq/L Final  . CO2 06/13/2014 27  19 - 32 mEq/L Final  . Glucose, Bld 06/13/2014 216* 70 - 99 mg/dL Final  . BUN 06/13/2014 28* 6 - 23 mg/dL Final  . Creatinine, Ser 06/13/2014 1.3  0.4 - 1.5 mg/dL Final  . Calcium 06/13/2014 8.8  8.4 - 10.5 mg/dL Final  . GFR 06/13/2014 58.61* >60.00 mL/min Final

## 2014-06-13 NOTE — Patient Instructions (Signed)
Diabetes and Foot Care Diabetes may cause you to have problems because of poor blood supply (circulation) to your feet and legs. This may cause the skin on your feet to become thinner, break easier, and heal more slowly. Your skin may become dry, and the skin may peel and crack. You may also have nerve damage in your legs and feet causing decreased feeling in them. You may not notice minor injuries to your feet that could lead to infections or more serious problems. Taking care of your feet is one of the most important things you can do for yourself.  HOME CARE INSTRUCTIONS  Wear shoes at all times, even in the house. Do not go barefoot. Bare feet are easily injured.  Check your feet daily for blisters, cuts, and redness. If you cannot see the bottom of your feet, use a mirror or ask someone for help.  Wash your feet with warm water (do not use hot water) and mild soap. Then pat your feet and the areas between your toes until they are completely dry. Do not soak your feet as this can dry your skin.  Apply a moisturizing lotion or petroleum jelly (that does not contain alcohol and is unscented) to the skin on your feet and to dry, brittle toenails. Do not apply lotion between your toes.  Trim your toenails straight across. Do not dig under them or around the cuticle. File the edges of your nails with an emery board or nail file.  Do not cut corns or calluses or try to remove them with medicine.  Wear clean socks or stockings every day. Make sure they are not too tight. Do not wear knee-high stockings since they may decrease blood flow to your legs.  Wear shoes that fit properly and have enough cushioning. To break in new shoes, wear them for just a few hours a day. This prevents you from injuring your feet. Always look in your shoes before you put them on to be sure there are no objects inside.  Do not cross your legs. This may decrease the blood flow to your feet.  If you find a minor scrape,  cut, or break in the skin on your feet, keep it and the skin around it clean and dry. These areas may be cleansed with mild soap and water. Do not cleanse the area with peroxide, alcohol, or iodine.  When you remove an adhesive bandage, be sure not to damage the skin around it.  If you have a wound, look at it several times a day to make sure it is healing.  Do not use heating pads or hot water bottles. They may burn your skin. If you have lost feeling in your feet or legs, you may not know it is happening until it is too late.  Make sure your health care provider performs a complete foot exam at least annually or more often if you have foot problems. Report any cuts, sores, or bruises to your health care provider immediately. SEEK MEDICAL CARE IF:   You have an injury that is not healing.  You have cuts or breaks in the skin.  You have an ingrown nail.  You notice redness on your legs or feet.  You feel burning or tingling in your legs or feet.  You have pain or cramps in your legs and feet.  Your legs or feet are numb.  Your feet always feel cold. SEEK IMMEDIATE MEDICAL CARE IF:   There is increasing redness,   swelling, or pain in or around a wound.  There is a red line that goes up your leg.  Pus is coming from a wound.  You develop a fever or as directed by your health care provider.  You notice a bad smell coming from an ulcer or wound. Document Released: 07/11/2000 Document Revised: 03/16/2013 Document Reviewed: 12/21/2012 [employer] Patient Information 2015 [employer]. This information is not intended to replace advice given to you by your health care provider. Make sure you discuss any questions you have with your health care provider.  

## 2014-06-13 NOTE — Progress Notes (Signed)
   Subjective:    Patient ID: [name], male    DOB: [birth year], 80 y.o.   MRN: [id number]  HPIpatient presents this time for diabetic foot and nail care no new changes has history partial rotation left great toe with keratoses still present on that toe bleeding debridement remaining nails thick brittle and dystrophic needing nail care    Review of Systems No new findings or systemic changes noted    Objective:   Physical Exam  Lower extremity objective reveals pedal pulses palpable DP +2 PT 1 over 4 bilateral capillary refill 3 seconds epicritic sensations are diminished on Semmes Weinstein to the forefoot digit history of amputation distal tuft left great toe with keratoses noted at the IP joint of the left hallux amputation stump site this is debrided also has nails thick brittle crumbly friable 1 through 5 right 2 through 5 left are debrided at this time no secondary infections no open wounds no ulcers noted      Assessment & Plan:  Assessment diabetes history peripheral neuropathy and complications history of partial amputation this time nails debrided 9 painful symptomatically criptotic nails 1 through 1 through 5 right 2 through 5 left. Also single keratotic lesion of the hallux IP joint distal left great toe are debrided return for future palliative care in 3 months as recommended  Harriet Masson DPM

## 2014-06-13 NOTE — Patient Instructions (Signed)
Increase Novolog at supper to 7 units unless eating small meal may reduce to 5   If sugars in am stay over 140 go up 1 unit on Lememir (12)

## 2014-06-14 LAB — BASIC METABOLIC PANEL
BUN: 28 mg/dL — ABNORMAL HIGH (ref 6–23)
CALCIUM: 8.8 mg/dL (ref 8.4–10.5)
CO2: 27 mEq/L (ref 19–32)
Chloride: 108 mEq/L (ref 96–112)
Creatinine, Ser: 1.3 mg/dL (ref 0.4–1.5)
GFR: 58.61 mL/min — AB (ref >60.00)
Glucose, Bld: 216 mg/dL — ABNORMAL HIGH (ref 70–99)
Potassium: 4.2 mEq/L (ref 3.5–5.1)
Sodium: 141 mEq/L (ref 135–145)

## 2014-06-28 DIAGNOSIS — N281 Cyst of kidney, acquired: Secondary | ICD-10-CM | POA: Diagnosis not present

## 2014-06-28 DIAGNOSIS — N401 Enlarged prostate with lower urinary tract symptoms: Secondary | ICD-10-CM | POA: Diagnosis not present

## 2014-06-28 DIAGNOSIS — R3916 Straining to void: Secondary | ICD-10-CM | POA: Diagnosis not present

## 2014-06-28 DIAGNOSIS — R32 Unspecified urinary incontinence: Secondary | ICD-10-CM | POA: Diagnosis not present

## 2014-07-17 ENCOUNTER — Other Ambulatory Visit: Payer: Self-pay | Admitting: *Deleted

## 2014-07-17 MED ORDER — INSULIN ASPART 100 UNIT/ML FLEXPEN: PEN_INJECTOR | SUBCUTANEOUS | Status: DC

## 2014-07-20 ENCOUNTER — Other Ambulatory Visit: Payer: Self-pay | Admitting: *Deleted

## 2014-07-20 MED ORDER — INSULIN LISPRO 100 UNIT/ML (KWIKPEN): PEN_INJECTOR | SUBCUTANEOUS | Status: DC

## 2014-07-31 ENCOUNTER — Ambulatory Visit (INDEPENDENT_AMBULATORY_CARE_PROVIDER_SITE_OTHER): Payer: Medicare Other | Admitting: *Deleted

## 2014-07-31 DIAGNOSIS — D509 Iron deficiency anemia, unspecified: Secondary | ICD-10-CM | POA: Diagnosis not present

## 2014-07-31 MED ORDER — CYANOCOBALAMIN 1000 MCG/ML IJ SOLN
1000.0000 ug | Freq: Once | INTRAMUSCULAR | Status: AC
  Administered 2014-07-31: 1000 ug via INTRAMUSCULAR

## 2014-08-14 ENCOUNTER — Ambulatory Visit (INDEPENDENT_AMBULATORY_CARE_PROVIDER_SITE_OTHER): Payer: Medicare Other | Admitting: Endocrinology

## 2014-08-14 ENCOUNTER — Encounter: Payer: Self-pay | Admitting: Endocrinology

## 2014-08-14 VITALS — BP 126/62 | HR 63 | Temp 98.0°F | Resp 14 | Ht 69.0 in | Wt 201.4 lb

## 2014-08-14 DIAGNOSIS — E1165 Type 2 diabetes mellitus with hyperglycemia: Secondary | ICD-10-CM | POA: Diagnosis not present

## 2014-08-14 DIAGNOSIS — I10 Essential (primary) hypertension: Secondary | ICD-10-CM

## 2014-08-14 DIAGNOSIS — E785 Hyperlipidemia, unspecified: Secondary | ICD-10-CM | POA: Diagnosis not present

## 2014-08-14 DIAGNOSIS — IMO0002 Reserved for concepts with insufficient information to code with codable children: Secondary | ICD-10-CM

## 2014-08-14 NOTE — Patient Instructions (Addendum)
Must check some sugars after dinner at night about 8-9 pm  Novolog 6 units with each meal  If sugar after supper is > 200 then go up to 8 Humalog at supper  If MORNNING sugar stays over 140 go to 12 Levemir

## 2014-08-14 NOTE — Progress Notes (Signed)
Patient ID: [name], male   DOB: [birth year], 81 y.o.   MRN: [id number]   Reason for Appointment: Diabetes follow-up   History of Present Illness   Diagnosis: Type 2 DIABETES MELITUS, diagnoses 1972   He has had long-standing diabetes and has been on basal bolus insulin regimen after failure of oral hypoglycemic drugs over the last several years His blood sugars have been generally fairly well controlled but has a tendency to high postprandial readings Previously required only a small dose of 5 units of Levemir insulin   RECENT history:  With restarting his Amaryl  his blood sugars had improved his blood sugars during the daytime However on his last visit he had significantly high readings after his evening meal and was told to increase the NovoLog at supper to 7 units Even though he was given instructions on this he still is taking 5 units Has not checked any blood sugars after dinner for the last month Current blood sugar patterns  Fasting blood sugars are fairly good with most readings below 140 recently  Blood sugars were significantly higher in the last week of December with the highest reading 360 at suppertime which he says was from excessive snacking and some sweets  More recently has check blood sugars mostly in the morning and some at lunch and supper  Blood sugars at lunch and supper are modestly increased, highest 211 at lunch  No readings after supper  Overall he has been less active and has not used his treadmill; has  gained a little weight He thinks he is generally watching his diet and this is mostly low fat  Hypoglycemia: none recently   Oral hypoglycemic drugs: Metformin, 1500 mg , Amaryl 4 mg daily in a.m.        Side effects from medications: None Insulin regimen: Levemir 11 units at 5pm, Humalog 5 a.c before meals            Proper timing of insulin in relation to meals: Yes.         Monitors blood glucose: 1.4 times a day.    Glucometer: One Touch.           Blood Glucose readings from meter download:   PRE-MEAL Breakfast Lunch Dinner Bedtime Overall  Glucose range:  115-182   169-222   169-360   125    Mean:     177   Meals: 3 meals per day. Supper at 5-6 pm; breakfast is oatmeal with eggs. Drinks water mostly and no sweet drinks           Physical activity: exercise: exercise bike        Complications are: Minimal    Eye exam last 7/15, no retinopathy reported   Wt Readings from Last 3 Encounters:  08/14/14 201 lb 6.4 oz (91.354 kg)  06/13/14 198 lb 12.8 oz (90.175 kg)  03/06/14 196 lb (88.905 kg)   Lab Results  Component Value Date   HGBA1C 7.7* 06/13/2014   HGBA1C 7.0* 03/06/2014   HGBA1C 9.2* 12/12/2013   Lab Results  Component Value Date   MICROALBUR 0.2 03/06/2014   LDLCALC 63 12/12/2013   CREATININE 1.3 06/13/2014       Medication List       This list is accurate as of: 08/14/14 10:56 AM.  Always use your most recent med list.               aspirin 81 MG tablet  Take 81 mg by  mouth daily.     atenolol 25 MG tablet  Commonly known as:  TENORMIN  Take 12.5-25 mg by mouth daily. 1 in the morning and one-half at bedtime     cholecalciferol 1000 UNITS tablet  Commonly known as:  VITAMIN D  Take 1,000 Units by mouth daily.     docusate sodium 100 MG capsule  Commonly known as:  COLACE  Take 100 mg by mouth 2 (two) times daily.     ferrous gluconate 325 MG tablet  Commonly known as:  FERGON  Take 325 mg by mouth daily with breakfast.     fluticasone 50 MCG/ACT nasal spray  Commonly known as:  FLONASE  Place 2 sprays into both nostrils daily.     glimepiride 4 MG tablet  Commonly known as:  AMARYL  TAKE 1 TABLET (4 MG TOTAL) BY MOUTH DAILY.     hydrochlorothiazide 25 MG tablet  Commonly known as:  HYDRODIURIL  Take 12.5 mg by mouth daily.     hyoscyamine 0.125 MG tablet  Commonly known as:  LEVSIN, ANASPAZ  Take 1 tablet (0.125 mg total) by mouth every 4 (four) hours as needed for  cramping (bladder spasms).     insulin lispro 100 UNIT/ML KiwkPen  Commonly known as:  HUMALOG KWIKPEN  Inject 4-5 units three times a day     Insulin Pen Needle 31G X 5 MM Misc  Commonly known as:  B-D UF III MINI PEN NEEDLES  Use 4 times per day     LEVEMIR FLEXPEN 100 UNIT/ML Pen  Generic drug:  Insulin Detemir  TAKE 14 UNITS SUBCUTANEOUS in PM     loratadine 10 MG tablet  Commonly known as:  CLARITIN  Take 10 mg by mouth daily.     metFORMIN 1000 MG tablet  Commonly known as:  GLUCOPHAGE  Take 1,000 mg by mouth 2 (two) times daily with a meal. Take one tablet in am and 1/2 tablet at bedtime     omeprazole 20 MG capsule  Commonly known as:  PRILOSEC  Take 20 mg by mouth daily.     oxybutynin 10 MG 24 hr tablet  Commonly known as:  DITROPAN-XL     simvastatin 80 MG tablet  Commonly known as:  ZOCOR  Take 40 mg by mouth at bedtime.     tamsulosin 0.4 MG Caps capsule  Commonly known as:  FLOMAX  Take 0.4 mg by mouth daily after supper.     VITAMIN B-12 IJ  Inject 1,000 mcg as directed every 21 ( twenty-one) days.        Allergies:  Allergies  Allergen Reactions  . Avodart [Dutasteride] Swelling  . Ibuprofen Nausea And Vomiting  . Morphine Nausea And Vomiting    Past Medical History  Diagnosis Date  . Diabetes mellitus   . Colon polyps   . BPH (benign prostatic hypertrophy)   . Osteoarthritis   . History of kidney stones   . Hypertension   . Hyperlipidemia   . B12 deficiency   . GERD (gastroesophageal reflux disease)     hiatal hernia    Past Surgical History  Procedure Laterality Date  . Appendectomy    . Partial amputaion left foot    . Knee cartilage surgery      Arthroscope  . Transurethral resection of prostate  06/17/2012    Procedure: TRANSURETHRAL RESECTION OF THE PROSTATE WITH GYRUS INSTRUMENTS;  Surgeon: Molli Hazard, MD;  Location: [facility];  Service: Urology;  Laterality:  N/A;  . Cystoscopy with biopsy  06/17/2012     Procedure: CYSTOSCOPY WITH BIOPSY;  Surgeon: Molli Hazard, MD;  Location: [facility];  Service: Urology;  Laterality: N/A;  . Carotid doppler  01/27/2011    Right & Left ICAs 0-49% diameter reduction, right & left subclavian arteries demonstrated less than 50% diameter reduction.  . Cardiovascular stress test  09/02/2011    Post stress myocardial perfusion images show normal pattern of perfusion in all areas. No significant wall abnormalites noted.  . Transthoracic echocardiogram  09/02/2011    EF >55%, normal LV systolic function, mild diastolic dysfunction    Family History  Problem Relation Age of Onset  . Heart attack Mother   . Hypertension Mother     Social History:  reports that he has quit smoking. He has never used smokeless tobacco. He reports that he does not drink alcohol or use illicit drugs.  Review of Systems:  HYPERTENSION: mild and usually well controlled, periodically checking at home, none recently. Blood pressure was initially high in the office His Mavik was stopped because of cough and he is not on a substitute  HYPERLIPIDEMIA:   Treated with high-dose simvastatin  with good control  Lab Results  Component Value Date   CHOL 127 12/12/2013   HDL 56.10 12/12/2013   LDLCALC 63 12/12/2013   TRIG 40.0 12/12/2013   CHOLHDL 2 12/12/2013        Examination:   BP 126/62 mmHg  Pulse 63  Temp(Src) 98 F (36.7 C)  Resp 14  Ht 5\' 9"  (1.753 m)  Wt 201 lb 6.4 oz (91.354 kg)  BMI 29.73 kg/m2  SpO2 95%  Body mass index is 29.73 kg/(m^2).   Repeat blood pressure 126/62 right side and 124/58 on the left No pedal edema present  ASSESSMENT/ PLAN:    Diabetes type 2 requiring insulin without significant obesity  The patient's diabetes control  is difficult to assess as he is checking blood sugars mostly before meals and not consistently On his last visit he was having significantly high readings after evening meal Overall has not done too well with his diet in  December and has gained a little weight Also not doing as much exercise, only mild exercise on his bike  He has not followed instructions for increasing his mealtime insulin at suppertime Discussed that he needs to follow his written instructions consistently and these were reviewed in detail Although his fasting blood sugars are fairly good recently not clear if he is having a 24-hour effect of his Levemir with his bedtime dose   Recommendations:  Increase Levemir if morning sugar stays over 140  Increase NovoLog by at least one unit  Increase suppertime dose further to 8 units if postprandial readings stays over 200  Consider switching to Lantus  Start checking periodic readings after meals in the evening  Exercise on treadmill  Balanced meals with some protein at each meal  Check blood pressure periodically at home  May consider low-dose losartan especially if his microalbumin goes up    Patient Instructions  Must check some sugars after dinner at night about 8-9 pm  Novolog 6 units with each meal  If sugar after supper is > 200 then go up to 8 Humalog at supper  If MORNNING sugar stays over 140 go to 12 Levemir   Counseling time over 50% of today's 25 minute visit  Khyler Urda 08/14/2014, 10:56 AM

## 2014-09-06 ENCOUNTER — Encounter: Payer: Self-pay | Admitting: Family Medicine

## 2014-09-06 ENCOUNTER — Ambulatory Visit (INDEPENDENT_AMBULATORY_CARE_PROVIDER_SITE_OTHER): Payer: Medicare Other | Admitting: Family Medicine

## 2014-09-06 ENCOUNTER — Telehealth: Payer: Self-pay | Admitting: Family Medicine

## 2014-09-06 VITALS — BP 140/70 | HR 68 | Temp 98.6°F | Resp 18 | Ht 69.0 in | Wt 198.0 lb

## 2014-09-06 DIAGNOSIS — J309 Allergic rhinitis, unspecified: Secondary | ICD-10-CM

## 2014-09-06 DIAGNOSIS — J069 Acute upper respiratory infection, unspecified: Secondary | ICD-10-CM

## 2014-09-06 MED ORDER — GUAIFENESIN-DM 100-10 MG/5ML PO SYRP: 5.0000 mL | ORAL_SOLUTION | Freq: Four times a day (QID) | ORAL | Status: DC | PRN

## 2014-09-06 MED ORDER — AZELASTINE HCL 0.1 % NA SOLN: 1.0000 | Freq: Two times a day (BID) | NASAL | Status: DC

## 2014-09-06 MED ORDER — BENZONATATE 100 MG PO CAPS: 100.0000 mg | ORAL_CAPSULE | Freq: Three times a day (TID) | ORAL | Status: DC | PRN

## 2014-09-06 NOTE — Patient Instructions (Addendum)
Get Tussin DM  Use new astelin spray Call if not improved F/U as previous with Dr. Dennard Schaumann

## 2014-09-06 NOTE — Telephone Encounter (Signed)
Received a call from pt stating was in office today was given prescription Robitussin 100-79ml/5ml and started taking it and now it is making him cough more than more, wants to know what can he do now. I advised pt to stop taking until he hears form one of [place] as to what to do next.  [facility]

## 2014-09-06 NOTE — Telephone Encounter (Signed)
This shouldn't make him cough more, send tessalon perrles 100mg  TID prn

## 2014-09-06 NOTE — Progress Notes (Signed)
Patient ID: [name], male   DOB: 12/17/1928, 81 y.o.   MRN: [id number]   Subjective:    Patient ID: [name], male    DOB: [birth year], 81 y.o.   MRN: [id number]  Patient presents for Illness  patient here with nasal drainage which is clear for the past 24 hours. He is also had some mild cough for some postnasal drip. He has history of problems with rhinitis is currently on Flonase but it has not been helping recently. He has not had any fever no shortness of breath however the cough does keep him up at night to keep his nose from dripping. He has not taken any cough medicine. He has not had any chest pain no wheezing no GI symptoms. He was at his granddaughter's birthday party this weekend and there were a few sick children.    Review Of Systems:  GEN- denies fatigue, fever, weight loss,weakness, recent illness HEENT- denies eye drainage, change in vision, +nasal discharge, CVS- denies chest pain, palpitations RESP- denies SOB, +cough, wheeze ABD- denies N/V, change in stools, abd pain Neuro- denies headache, dizziness, syncope, seizure activity       Objective:    BP 140/70 mmHg  Pulse 68  Temp(Src) 98.6 F (37 C) (Oral)  Resp 18  Ht 5\' 9"  (1.753 m)  Wt 198 lb (89.812 kg)  BMI 29.23 kg/m2 GEN- NAD, alert and oriented x3 HEENT- PERRL, EOMI, non injected sclera, pink conjunctiva, MMM, oropharynx mild injection, TM clear bilat no effusion, no  maxillary sinus tenderness, inflammed turbinates,  Nasal drainage  Neck- Supple, no LAD CVS- RRR, 3/6 SEM RESP-CTAB EXT- No edema Pulses- Radial 2+       Assessment & Plan:      Problem List Items Addressed This Visit    None    Visit Diagnoses    Acute URI    -  Primary    possible early viral symptoms, looks fairly well except for rhinitis, will use robitussin DM and see how he does, Chest is clear, no antibiotics- 1 day of sympt    Allergic rhinitis, unspecified allergic rhinitis type        Trial of astelin  nasal spray, hold flonase       Note I spoke to pt and wife, he had a cough spell after taking a dose of robitussin, but then cough stopped and he napped and has been okay, advised he can try another dose at 8pm if it makes him sick stop and I have sent tessalon perrles over to pharmacy as a back up    Note: This dictation was prepared with Dragon dictation along with smaller phrase technology. Any transcriptional errors that result from this process are unintentional.

## 2014-09-07 NOTE — Telephone Encounter (Signed)
Pt is aware of message and that script has been called in

## 2014-09-12 ENCOUNTER — Ambulatory Visit (INDEPENDENT_AMBULATORY_CARE_PROVIDER_SITE_OTHER): Payer: Medicare Other | Admitting: *Deleted

## 2014-09-12 ENCOUNTER — Ambulatory Visit: Payer: Medicare Other

## 2014-09-12 DIAGNOSIS — D519 Vitamin B12 deficiency anemia, unspecified: Secondary | ICD-10-CM

## 2014-09-12 MED ORDER — CYANOCOBALAMIN 1000 MCG/ML IJ SOLN
1000.0000 ug | Freq: Once | INTRAMUSCULAR | Status: AC
  Administered 2014-09-12: 1000 ug via INTRAMUSCULAR

## 2014-09-20 DIAGNOSIS — Z85828 Personal history of other malignant neoplasm of skin: Secondary | ICD-10-CM | POA: Diagnosis not present

## 2014-09-20 DIAGNOSIS — L821 Other seborrheic keratosis: Secondary | ICD-10-CM | POA: Diagnosis not present

## 2014-09-20 DIAGNOSIS — D485 Neoplasm of uncertain behavior of skin: Secondary | ICD-10-CM | POA: Diagnosis not present

## 2014-09-20 DIAGNOSIS — C44311 Basal cell carcinoma of skin of nose: Secondary | ICD-10-CM | POA: Diagnosis not present

## 2014-09-20 DIAGNOSIS — L570 Actinic keratosis: Secondary | ICD-10-CM | POA: Diagnosis not present

## 2014-09-21 ENCOUNTER — Encounter: Payer: Self-pay | Admitting: Podiatrist

## 2014-09-21 ENCOUNTER — Ambulatory Visit (INDEPENDENT_AMBULATORY_CARE_PROVIDER_SITE_OTHER): Payer: Medicare Other | Admitting: Podiatrist

## 2014-09-21 DIAGNOSIS — M79673 Pain in unspecified foot: Secondary | ICD-10-CM | POA: Diagnosis not present

## 2014-09-21 DIAGNOSIS — B351 Tinea unguium: Secondary | ICD-10-CM

## 2014-09-21 NOTE — Progress Notes (Signed)
HPI:  Patient presents today for follow up of foot and nail care. Denies any new complaints today.  Objective:  Patients chart is reviewed.  Vascular status reveals pedal pulses noted at 2 out of 4 dp and pt bilateral .  Neurological sensation is intact to [employer] monofilament bilateral.  Patients nails are thickened, discolored, distrophic, friable and brittle with yellow-brown discoloration x 8. Patient subjectively relates they are painful with shoes and with ambulation of bilateral feet. Traumatic amputation to the hallux and second toe left due to an on the job injury in the 66's is noted. Hyperkeratotic lesion forms at the tip of the left great toe which is symptomatic.   Assessment:  Symptomatic onychomycosis, hyperkeratotic lesion x 1  Plan:  Discussed treatment options and alternatives.  The symptomatic lesion and  toenails were debrided through manual an mechanical means without complication.  Return appointment recommended at routine intervals of 3 months

## 2014-09-26 ENCOUNTER — Encounter: Payer: Self-pay | Admitting: Endocrinology

## 2014-09-26 ENCOUNTER — Ambulatory Visit (INDEPENDENT_AMBULATORY_CARE_PROVIDER_SITE_OTHER): Payer: Medicare Other | Admitting: Endocrinology

## 2014-09-26 VITALS — BP 154/63 | HR 64 | Temp 98.3°F | Resp 16 | Ht 69.0 in | Wt 199.6 lb

## 2014-09-26 DIAGNOSIS — E785 Hyperlipidemia, unspecified: Secondary | ICD-10-CM

## 2014-09-26 DIAGNOSIS — E1165 Type 2 diabetes mellitus with hyperglycemia: Secondary | ICD-10-CM

## 2014-09-26 DIAGNOSIS — I10 Essential (primary) hypertension: Secondary | ICD-10-CM | POA: Diagnosis not present

## 2014-09-26 DIAGNOSIS — IMO0002 Reserved for concepts with insufficient information to code with codable children: Secondary | ICD-10-CM

## 2014-09-26 LAB — COMPREHENSIVE METABOLIC PANEL
ALT: 17 U/L (ref 0–53)
AST: 18 U/L (ref 0–37)
Albumin: 4.1 g/dL (ref 3.5–5.2)
Alkaline Phosphatase: 78 U/L (ref 39–117)
BILIRUBIN TOTAL: 0.6 mg/dL (ref 0.2–1.2)
BUN: 24 mg/dL — ABNORMAL HIGH (ref 6–23)
CO2: 28 meq/L (ref 19–32)
CREATININE: 1.22 mg/dL (ref 0.40–1.50)
Calcium: 9.2 mg/dL (ref 8.4–10.5)
Chloride: 105 mEq/L (ref 96–112)
GFR: 60.23 mL/min (ref >60.00)
GLUCOSE: 232 mg/dL — AB (ref 70–99)
Potassium: 4.3 mEq/L (ref 3.5–5.1)
Sodium: 138 mEq/L (ref 135–145)
Total Protein: 6.9 g/dL (ref 6.0–8.3)

## 2014-09-26 LAB — URINALYSIS, ROUTINE W REFLEX MICROSCOPIC
BILIRUBIN URINE: NEGATIVE
HGB URINE DIPSTICK: NEGATIVE
Ketones, ur: NEGATIVE
Leukocytes, UA: NEGATIVE
NITRITE: NEGATIVE
RBC / HPF: NONE SEEN (ref 0–?)
Specific Gravity, Urine: 1.025 (ref 1.000–1.030)
Total Protein, Urine: NEGATIVE
UROBILINOGEN UA: 0.2 (ref 0.0–1.0)
Urine Glucose: NEGATIVE
WBC, UA: NONE SEEN (ref 0–?)
pH: 5.5 (ref 5.0–8.0)

## 2014-09-26 LAB — MICROALBUMIN / CREATININE URINE RATIO
CREATININE, U: 149.2 mg/dL
MICROALB UR: 1.2 mg/dL (ref 0.0–1.9)
Microalb Creat Ratio: 0.8 mg/g (ref 0.0–30.0)

## 2014-09-26 LAB — LIPID PANEL
CHOLESTEROL: 130 mg/dL (ref 0–200)
HDL: 48.5 mg/dL (ref >39.00)
LDL Cholesterol: 66 mg/dL (ref 0–99)
NonHDL: 81.5
TRIGLYCERIDES: 78 mg/dL (ref 0.0–149.0)
Total CHOL/HDL Ratio: 3
VLDL: 15.6 mg/dL (ref 0.0–40.0)

## 2014-09-26 LAB — HEMOGLOBIN A1C: Hgb A1c MFr Bld: 8 % — ABNORMAL HIGH (ref 4.6–6.5)

## 2014-09-26 NOTE — Progress Notes (Signed)
Patient ID: [name], male   DOB: Mar 20, 1929, 81 y.o.   MRN: [id number]   Reason for Appointment: Diabetes follow-up   History of Present Illness   Diagnosis: Type 2 DIABETES MELITUS, diagnoses 1972   He has had long-standing diabetes and has been on basal bolus insulin regimen after failure of oral hypoglycemic drugs over the last several years His blood sugars have been generally fairly well controlled but has a tendency to high postprandial readings Previously required only a small dose of 5 units of Levemir insulin  Taking Amaryl in addition to his insulin regimen  his blood sugars had improved his blood sugars during the daytime  RECENT history:    On his last visit because of continued poor control especially postprandial he was told to increase his suppertime dose to 8 units but he did not do so.  He has taken only 6 units of Humalog with his meals and does not adjust the dose based on what he is eating.   He was also advised to increase the Levemir further if morning sugars were over 140 but he has not done this either.  He has taken Levemir both in the mornings and evenings and previously had better fasting readings with taking the injection at bedtime He has had an A1c about 3 months ago in no recent levels available  Has not checked any blood sugars after dinner for the last week Current blood sugar patterns and problems identified  Fasting blood sugars are somewhat high overall with average about 145   His blood sugars appear to be progressively higher as the day goes on although he is checking only sporadically later in the day.  His blood sugars for the last 3 readings after supper are nearly 300 at times    blood sugars appear to be higher at suppertime also   He has not exercised as discussed previously   Also he may still get relatively high carbohydrate breakfast at times ; has no readings after breakfast   Hypoglycemia: none recently   Oral  hypoglycemic drugs: Metformin, 1500 mg , Amaryl 4 mg daily in a.m.        Side effects from medications: None Insulin regimen: Levemir 11 units at 5pm, Humalog 6 a.c before meals            Proper timing of insulin in relation to meals: Yes.         Monitors blood glucose: 1.4 times a day.    Glucometer: One Touch.          Blood Glucose readings from meter download:   PRE-MEAL Breakfast Lunch Dinner Bedtime Overall  Glucose range:  109-207  193  182, 240  193-296    median:  144    292  157   Meals: 3 meals per day. Supper at 5-6 pm; breakfast is oatmeal with eggs. Drinks water mostly and no sweet drinks           Physical activity: exercise: exercise bike occasionally        Complications are: Minimal    Eye exam last 7/15, no retinopathy reported   Wt Readings from Last 3 Encounters:  09/26/14 199 lb 9.6 oz (90.538 kg)  09/06/14 198 lb (89.812 kg)  08/14/14 201 lb 6.4 oz (91.354 kg)   Lab Results  Component Value Date   HGBA1C 8.0* 09/26/2014   HGBA1C 7.7* 06/13/2014   HGBA1C 7.0* 03/06/2014   Lab Results  Component Value Date  MICROALBUR 1.2 09/26/2014   LDLCALC 66 09/26/2014   CREATININE 1.22 09/26/2014       Medication List       This list is accurate as of: 09/26/14  9:28 PM.  Always use your most recent med list.               aspirin 81 MG tablet  Take 81 mg by mouth daily.     atenolol 25 MG tablet  Commonly known as:  TENORMIN  Take 12.5-25 mg by mouth daily. 1 in the morning and one-half at bedtime     azelastine 0.1 % nasal spray  Commonly known as:  ASTELIN  Place 1 spray into both nostrils 2 (two) times daily. Use in each nostril as directed     benzonatate 100 MG capsule  Commonly known as:  TESSALON  Take 1 capsule (100 mg total) by mouth 3 (three) times daily as needed for cough.     cholecalciferol 1000 UNITS tablet  Commonly known as:  VITAMIN D  Take 1,000 Units by mouth daily.     docusate sodium 100 MG capsule  Commonly known as:   COLACE  Take 100 mg by mouth 2 (two) times daily.     ferrous gluconate 325 MG tablet  Commonly known as:  FERGON  Take 325 mg by mouth daily with breakfast.     fluticasone 50 MCG/ACT nasal spray  Commonly known as:  FLONASE  Place 2 sprays into both nostrils daily.     glimepiride 4 MG tablet  Commonly known as:  AMARYL  TAKE 1 TABLET (4 MG TOTAL) BY MOUTH DAILY.     guaiFENesin-dextromethorphan 100-10 MG/5ML syrup  Commonly known as:  ROBITUSSIN DM  Take 5 mLs by mouth every 6 (six) hours as needed for cough.     hydrochlorothiazide 25 MG tablet  Commonly known as:  HYDRODIURIL  Take 12.5 mg by mouth daily.     hyoscyamine 0.125 MG tablet  Commonly known as:  LEVSIN, ANASPAZ  Take 1 tablet (0.125 mg total) by mouth every 4 (four) hours as needed for cramping (bladder spasms).     insulin lispro 100 UNIT/ML KiwkPen  Commonly known as:  HUMALOG KWIKPEN  Inject 4-5 units three times a day     Insulin Pen Needle 31G X 5 MM Misc  Commonly known as:  B-D UF III MINI PEN NEEDLES  Use 4 times per day     LEVEMIR FLEXPEN 100 UNIT/ML Pen  Generic drug:  Insulin Detemir  TAKE 14 UNITS SUBCUTANEOUS in PM     loratadine 10 MG tablet  Commonly known as:  CLARITIN  Take 10 mg by mouth daily.     metFORMIN 1000 MG tablet  Commonly known as:  GLUCOPHAGE  Take 1,000 mg by mouth 2 (two) times daily with a meal. Take one tablet in am and 1/2 tablet at bedtime     omeprazole 20 MG capsule  Commonly known as:  PRILOSEC  Take 20 mg by mouth daily.     oxybutynin 10 MG 24 hr tablet  Commonly known as:  DITROPAN-XL     simvastatin 80 MG tablet  Commonly known as:  ZOCOR  Take 40 mg by mouth at bedtime.     VITAMIN B-12 IJ  Inject 1,000 mcg as directed every 21 ( twenty-one) days.        Allergies:  Allergies  Allergen Reactions  . Avodart [Dutasteride] Swelling  . Ibuprofen Nausea And Vomiting  .  Morphine Nausea And Vomiting    Past Medical History  Diagnosis Date   . Diabetes mellitus   . Colon polyps   . BPH (benign prostatic hypertrophy)   . Osteoarthritis   . History of kidney stones   . Hypertension   . Hyperlipidemia   . B12 deficiency   . GERD (gastroesophageal reflux disease)     hiatal hernia    Past Surgical History  Procedure Laterality Date  . Appendectomy    . Partial amputaion left foot    . Knee cartilage surgery      Arthroscope  . Transurethral resection of prostate  06/17/2012    Procedure: TRANSURETHRAL RESECTION OF THE PROSTATE WITH GYRUS INSTRUMENTS;  Surgeon: Molli Hazard, MD;  Location: [facility];  Service: Urology;  Laterality: N/A;  . Cystoscopy with biopsy  06/17/2012    Procedure: CYSTOSCOPY WITH BIOPSY;  Surgeon: Molli Hazard, MD;  Location: [facility];  Service: Urology;  Laterality: N/A;  . Carotid doppler  01/27/2011    Right & Left ICAs 0-49% diameter reduction, right & left subclavian arteries demonstrated less than 50% diameter reduction.  . Cardiovascular stress test  09/02/2011    Post stress myocardial perfusion images show normal pattern of perfusion in all areas. No significant wall abnormalites noted.  . Transthoracic echocardiogram  09/02/2011    EF >55%, normal LV systolic function, mild diastolic dysfunction    Family History  Problem Relation Age of Onset  . Heart attack Mother   . Hypertension Mother     Social History:  reports that he has quit smoking. He has never used smokeless tobacco. He reports that he does not drink alcohol or use illicit drugs.  Review of Systems:  HYPERTENSION: mild and usually well controlled, periodically checking at home but not recently Blood pressure has been treated by his PCP  He probably has ACE inhibitor related cough  HYPERLIPIDEMIA:   Treated with high-dose simvastatin  with good control  Lab Results  Component Value Date   CHOL 130 09/26/2014   HDL 48.50 09/26/2014   LDLCALC 66 09/26/2014   TRIG 78.0 09/26/2014   CHOLHDL 3 09/26/2014         Examination:   BP 154/63 mmHg  Pulse 64  Temp(Src) 98.3 F (36.8 C)  Resp 16  Ht 5\' 9"  (1.753 m)  Wt 199 lb 9.6 oz (90.538 kg)  BMI 29.46 kg/m2  SpO2 97%  Body mass index is 29.46 kg/(m^2).    ASSESSMENT/ PLAN:    Diabetes type 2 requiring insulin without significant obesity  The patient's diabetes control is still inadequate with mostly postprandial hyperglycemia  Also not clear if his Levemir as lasting the 24 hours with his bedtime dose He does not appear to be understanding how to adjust his insulin on his own even after giving detailed instructions.   Again discussed in detail the Different actions of basal insulin and bolus insulin and what they are used to control ; discussed principles of adjustment of the insulin doses and need for blood sugar patterns to be established throughout the day.  Also discussed adding protein to each meal and importance of regular exercise  Also difficult to adjust his mealtime doses since he usually does not check his readings after meals except occasionally after supper.    He was given a co-pay card for her free box of Humalog pens as he is again complaining about the cost  However does not want to use syringes for his  insulin   Recommendations as in patient instructions:  Increase Levemir to 13 units and take this in the morning   Humalog 6 at breakfast, 7 at lunch and 8 at supper   More consistent postprandial monitoring  Consistent exercise   Review blood sugars by phone next week and adjust insulin further    HYPERTENSION: Not well controlled and needs to follow-up with PCP , discussed  Patient Instructions  Change Levemir to am and start 13 units tomorrow  HUMALOG 6 units in am, 7 at lunch and 8 at supper  Check sugar more after supper  Restart exercise daily  Call sugar readings in 1 week  Counseling time over 50% of today's 25 minute visit  Facundo Allemand 09/26/2014, 9:28 PM    addendum: A1c 8% , higher than  before  Office Visit on 09/26/2014  Component Date Value Ref Range Status  . Hgb A1c MFr Bld 09/26/2014 8.0* 4.6 - 6.5 % Final   Glycemic Control Guidelines for People with Diabetes:Non Diabetic:  <6%Goal of Therapy: <7%Additional Action Suggested:  >8%   . Sodium 09/26/2014 138  135 - 145 mEq/L Final  . Potassium 09/26/2014 4.3  3.5 - 5.1 mEq/L Final  . Chloride 09/26/2014 105  96 - 112 mEq/L Final  . CO2 09/26/2014 28  19 - 32 mEq/L Final  . Glucose, Bld 09/26/2014 232* 70 - 99 mg/dL Final  . BUN 09/26/2014 24* 6 - 23 mg/dL Final  . Creatinine, Ser 09/26/2014 1.22  0.40 - 1.50 mg/dL Final  . Total Bilirubin 09/26/2014 0.6  0.2 - 1.2 mg/dL Final  . Alkaline Phosphatase 09/26/2014 78  39 - 117 U/L Final  . AST 09/26/2014 18  0 - 37 U/L Final  . ALT 09/26/2014 17  0 - 53 U/L Final  . Total Protein 09/26/2014 6.9  6.0 - 8.3 g/dL Final  . Albumin 09/26/2014 4.1  3.5 - 5.2 g/dL Final  . Calcium 09/26/2014 9.2  8.4 - 10.5 mg/dL Final  . GFR 09/26/2014 60.23  >60.00 mL/min Final  . Microalb, Ur 09/26/2014 1.2  0.0 - 1.9 mg/dL Final  . Creatinine,U 09/26/2014 149.2   Final  . Microalb Creat Ratio 09/26/2014 0.8  0.0 - 30.0 mg/g Final  . Color, Urine 09/26/2014 YELLOW  Yellow;Lt. Yellow Final  . APPearance 09/26/2014 CLEAR  Clear Final  . Specific Gravity, Urine 09/26/2014 1.025  1.000-1.030 Final  . pH 09/26/2014 5.5  5.0 - 8.0 Final  . Total Protein, Urine 09/26/2014 NEGATIVE  Negative Final  . Urine Glucose 09/26/2014 NEGATIVE  Negative Final  . Ketones, ur 09/26/2014 NEGATIVE  Negative Final  . Bilirubin Urine 09/26/2014 NEGATIVE  Negative Final  . Hgb urine dipstick 09/26/2014 NEGATIVE  Negative Final  . Urobilinogen, UA 09/26/2014 0.2  0.0 - 1.0 Final  . Leukocytes, UA 09/26/2014 NEGATIVE  Negative Final  . Nitrite 09/26/2014 NEGATIVE  Negative Final  . WBC, UA 09/26/2014 none seen  0-2/hpf Final  . RBC / HPF 09/26/2014 none seen  0-2/hpf Final  . Squamous Epithelial / LPF  09/26/2014 Rare(0-4/hpf)  Rare(0-4/hpf) Final  . Cholesterol 09/26/2014 130  0 - 200 mg/dL Final   ATP III Classification       Desirable:  < 200 mg/dL               Borderline High:  200 - 239 mg/dL          High:  > = 240 mg/dL  . Triglycerides 09/26/2014 78.0  0.0 -  149.0 mg/dL Final   Normal:  <150 mg/dLBorderline High:  150 - 199 mg/dL  . HDL 09/26/2014 48.50  >39.00 mg/dL Final  . VLDL 09/26/2014 15.6  0.0 - 40.0 mg/dL Final  . LDL Cholesterol 09/26/2014 66  0 - 99 mg/dL Final  . Total CHOL/HDL Ratio 09/26/2014 3   Final                  Men          Women1/2 Average Risk     3.4          3.3Average Risk          5.0          4.42X Average Risk          9.6          7.13X Average Risk          15.0          11.0                      . NonHDL 09/26/2014 81.50   Final   NOTE:  Non-HDL goal should be 30 mg/dL higher than patient's LDL goal (i.e. LDL goal of < 70 mg/dL, would have non-HDL goal of < 100 mg/dL)

## 2014-09-26 NOTE — Patient Instructions (Signed)
Change Levemir to am and start 13 units tomorrow  HUMALOG 6 units in am, 7 at lunch and 8 at supper  Check sugar more after supper  Restart exercise daily  Call sugar readings in 1 week

## 2014-09-26 NOTE — Progress Notes (Signed)
Quick Note:  Please let patient know that the A1c is slightly higher at 8%, cholesterol okay. Glucose after breakfast was 232  ______

## 2014-10-10 DIAGNOSIS — C44311 Basal cell carcinoma of skin of nose: Secondary | ICD-10-CM | POA: Diagnosis not present

## 2014-10-12 ENCOUNTER — Ambulatory Visit (INDEPENDENT_AMBULATORY_CARE_PROVIDER_SITE_OTHER): Payer: Medicare Other | Admitting: *Deleted

## 2014-10-12 DIAGNOSIS — E538 Deficiency of other specified B group vitamins: Secondary | ICD-10-CM | POA: Diagnosis not present

## 2014-10-12 MED ORDER — CYANOCOBALAMIN 1000 MCG/ML IJ SOLN
1000.0000 ug | Freq: Once | INTRAMUSCULAR | Status: AC
  Administered 2014-10-12: 1000 ug via INTRAMUSCULAR

## 2014-11-03 ENCOUNTER — Other Ambulatory Visit: Payer: Self-pay | Admitting: Endocrinology

## 2014-11-14 ENCOUNTER — Ambulatory Visit: Payer: Medicare Other | Admitting: Cardiovascular Disease

## 2014-11-17 ENCOUNTER — Other Ambulatory Visit: Payer: Self-pay | Admitting: Endocrinology

## 2014-11-27 ENCOUNTER — Encounter: Payer: Self-pay | Admitting: Cardiovascular Disease

## 2014-11-27 ENCOUNTER — Ambulatory Visit (INDEPENDENT_AMBULATORY_CARE_PROVIDER_SITE_OTHER): Payer: Medicare Other | Admitting: Cardiovascular Disease

## 2014-11-27 VITALS — BP 118/60 | HR 60 | Ht 69.0 in | Wt 198.9 lb

## 2014-11-27 DIAGNOSIS — E785 Hyperlipidemia, unspecified: Secondary | ICD-10-CM

## 2014-11-27 DIAGNOSIS — I10 Essential (primary) hypertension: Secondary | ICD-10-CM | POA: Diagnosis not present

## 2014-11-27 DIAGNOSIS — R011 Cardiac murmur, unspecified: Secondary | ICD-10-CM | POA: Diagnosis not present

## 2014-11-27 DIAGNOSIS — I700 Atherosclerosis of aorta: Secondary | ICD-10-CM | POA: Diagnosis not present

## 2014-11-27 DIAGNOSIS — IMO0001 Reserved for inherently not codable concepts without codable children: Secondary | ICD-10-CM

## 2014-11-27 NOTE — Progress Notes (Signed)
Patient ID: [name], male   DOB: [birth year], 82 y.o.   MRN: [id number]     HPI: [name] is a 81 y.o. male who presents for 1 year cardiology evaluation.  [name] has a history of type 2 diabetes mellitus, hypertension, hyperlipidemia, previously documented grade 1 diastolic dysfunction, and intermittent peripheral edema.  He has been on beta blocker therapy, which has controlled a prior episode of atrial tachycardia, which occurred with stress.  He goes to the [facility] yearly and gets his medications at the [facility].  He has remained relatively stable over the past year.  He is status post TURP has had continues continuous difficulty with urinary incontinence for which she is required to wear a diaper.  He denies any episodes of chest pain.  He denies PND, orthopnea.  He had laboratory done by Dr. Dwyane [name] recently and these were reviewed.  His blood sugar has not been well controlled with a recent hemoglobin A1c at 8.0, and a fasting glucose of 232.   Past Medical History  Diagnosis Date  . Diabetes mellitus   . Colon polyps   . BPH (benign prostatic hypertrophy)   . Osteoarthritis   . History of kidney stones   . Hypertension   . Hyperlipidemia   . B12 deficiency   . GERD (gastroesophageal reflux disease)     hiatal hernia    Past Surgical History  Procedure Laterality Date  . Appendectomy    . Partial amputaion left foot    . Knee cartilage surgery      Arthroscope  . Transurethral resection of prostate  06/17/2012    Procedure: TRANSURETHRAL RESECTION OF THE PROSTATE WITH GYRUS INSTRUMENTS;  Surgeon: [name] Hazard, MD;  Location: [facility];  Service: Urology;  Laterality: N/A;  . Cystoscopy with biopsy  06/17/2012    Procedure: CYSTOSCOPY WITH BIOPSY;  Surgeon: [name] Hazard, MD;  Location: [facility];  Service: Urology;  Laterality: N/A;  . Carotid doppler  01/27/2011    Right & Left ICAs 0-49% diameter reduction, right & left subclavian  arteries demonstrated less than 50% diameter reduction.  . Cardiovascular stress test  09/02/2011    Post stress myocardial perfusion images show normal pattern of perfusion in all areas. No significant wall abnormalites noted.  . Transthoracic echocardiogram  09/02/2011    EF >55%, normal LV systolic function, mild diastolic dysfunction    Allergies  Allergen Reactions  . Avodart [Dutasteride] Swelling  . Ibuprofen Nausea And Vomiting  . Morphine Nausea And Vomiting    Current Outpatient Prescriptions  Medication Sig Dispense Refill  . aspirin 81 MG tablet Take 81 mg by mouth daily.    Marland Kitchen atenolol (TENORMIN) 25 MG tablet Take 12.5-25 mg by mouth daily. 1 in the morning and one-half at bedtime    . azelastine (ASTELIN) 0.1 % nasal spray Place 1 spray into both nostrils 2 (two) times daily. Use in each nostril as directed 30 mL 3  . cholecalciferol (VITAMIN D) 1000 UNITS tablet Take 1,000 Units by mouth daily.    . Cyanocobalamin (VITAMIN B-12 IJ) Inject 1,000 mcg as directed every 21 ( twenty-one) days.      Marland Kitchen docusate sodium (COLACE) 100 MG capsule Take 100 mg by mouth 2 (two) times daily.    . ferrous gluconate (FERGON) 325 MG tablet Take 325 mg by mouth daily with breakfast.    . fluticasone (FLONASE) 50 MCG/ACT nasal spray Place 2 sprays into both nostrils daily. [place]  g 6  . glimepiride (AMARYL) 4 MG tablet TAKE 1 TABLET (4 MG TOTAL) BY MOUTH DAILY. 30 tablet 5  . hydrochlorothiazide 25 MG tablet Take 12.5 mg by mouth daily.     . Insulin Detemir (LEVEMIR FLEXTOUCH) 100 UNIT/ML Pen Inject 13 Units into the skin daily at 10 pm. 15 mL 3  . insulin lispro (HUMALOG KWIKPEN) 100 UNIT/ML KiwkPen Inject 4-5 units three times a day 15 mL 3  . Insulin Pen Needle (B-D UF III MINI PEN NEEDLES) 31G X 5 MM MISC Use 4 times per day 130 each 3  . LEVEMIR FLEXTOUCH 100 UNIT/ML Pen TAKE 5 UNITS SUBCUTANEOUS AT BEDTIME 15 mL 3  . metFORMIN (GLUCOPHAGE) 1000 MG tablet Take 1,000 mg by mouth 2 (two) times  daily with a meal. Take one tablet in am and 1/2 tablet at bedtime    . omeprazole (PRILOSEC) 20 MG capsule Take 20 mg by mouth daily.      Marland Kitchen oxybutynin (DITROPAN-XL) 10 MG 24 hr tablet     . simvastatin (ZOCOR) 80 MG tablet Take 40 mg by mouth at bedtime.     No current facility-administered medications for this visit.    History   Social History  . Marital Status: Married    Spouse Name: N/A  . Number of Children: N/A  . Years of Education: N/A   Occupational History  . Not on file.   Social History Main Topics  . Smoking status: Former Research scientist (life sciences)  . Smokeless tobacco: Never Used  . Alcohol Use: No  . Drug Use: No  . Sexual Activity: Not on file     Comment: married to [name], retired.   Other Topics Concern  . Not on file   Social History Narrative   Socially, he is married and has 2 children 5 grandchildren one great-grandchild.  There is no tobacco or alcohol use.  He is retired.  Family History  Problem Relation Age of Onset  . Heart attack Mother   . Hypertension Mother     ROS General: Negative; No fevers, chills, or night sweats;  HEENT: Negative; No changes in vision or hearing, sinus congestion, difficulty swallowing Pulmonary: Negative; No cough, wheezing, shortness of breath, hemoptysis Cardiovascular: Negative; No chest pain, presyncope, syncope, palpitations GI: Negative; No nausea, vomiting, diarrhea, or abdominal pain GU: Positive for incontinence following his TURP Musculoskeletal: Negative; no myalgias, joint pain, or weakness Hematologic/Oncology: Negative; no easy bruising, bleeding Endocrine: Negative; no heat/cold intolerance; no diabetes Neuro: Negative; no changes in balance, headaches Skin: Negative; No rashes or skin lesions Psychiatric: Negative; No behavioral problems, depression Sleep: Negative; No snoring, daytime sleepiness, hypersomnolence, bruxism, restless legs, hypnogognic hallucinations, no cataplexy Other comprehensive 14 point  system review is negative.   PE BP 118/60 mmHg  Pulse 60  Ht 5' 9"  (1.753 m)  Wt 198 lb 14.4 oz (90.22 kg)  BMI 29.36 kg/m2   Wt Readings from Last 3 Encounters:  11/27/14 198 lb 14.4 oz (90.22 kg)  09/26/14 199 lb 9.6 oz (90.538 kg)  09/06/14 198 lb (89.812 kg)   General: Alert, oriented, no distress.  Skin: normal turgor, no rashes HEENT: Normocephalic, atraumatic. Pupils round and reactive; sclera anicteric;no lid lag. Extraocular muscles intact; no xanthelasmas. Nose without nasal septal hypertrophy Mouth/Parynx benign; Mallinpatti scale 3 Neck: No JVD, no carotid bruits; normal carotid upstroke Lungs: clear to ausculatation and percussion; no wheezing or rales Chest wall: no tenderness to palpitation Heart: RRR, s1 s2 normal; 2/6 systolic murmur concordant  with his aortic valve disease.  no diastolic murmur, rub thrills or heaves Abdomen: soft, nontender; no hepatosplenomehaly, BS+; abdominal aorta nontender and not dilated by palpation. Back: no CVA tenderness Pulses 2+ Extremities: Trace ankle edema, Homan's sign negative  Neurologic: grossly nonfocal; cranial nerves grossly normal. Psychologic: normal affect and mood.  ECG (independently read by me): Normal sinus rhythm at 60 bpm.  Normal intervals.  No ectopy.  Nonspecific ST changes.  ECG (independently read by me): Sinus rhythm at 60 beats per minute.  No ectopy.  Normal intervals.  LABS: BMP Latest Ref Rng 09/26/2014 06/13/2014 03/06/2014  Glucose 70 - 99 mg/dL 232(H) 216(H) 176(H)  BUN 6 - 23 mg/dL 24(H) 28(H) 28(H)  Creatinine 0.40 - 1.50 mg/dL 1.22 1.3 1.2  Sodium 135 - 145 mEq/L 138 141 139  Potassium 3.5 - 5.1 mEq/L 4.3 4.2 4.2  Chloride 96 - 112 mEq/L 105 108 106  CO2 19 - 32 mEq/L 28 27 24   Calcium 8.4 - 10.5 mg/dL 9.2 8.8 9.0   Hepatic Function Latest Ref Rng 09/26/2014 03/06/2014 09/14/2013  Total Protein 6.0 - 8.3 g/dL 6.9 6.9 6.8  Albumin 3.5 - 5.2 g/dL 4.1 3.9 3.9  AST 0 - 37 U/L 18 22 20   ALT 0 - 53  U/L 17 19 17   Alk Phosphatase 39 - 117 U/L 78 76 65  Total Bilirubin 0.2 - 1.2 mg/dL 0.6 0.6 0.5   CBC Latest Ref Rng 06/17/2012 06/10/2012 02/01/2009  WBC 4.0 - 10.5 K/uL - 8.4 8.4  Hemoglobin 13.0 - 17.0 g/dL 11.0(L) 12.8(L) 8.6(L)  Hematocrit 39.0 - 52.0 % 32.1(L) 38.0(L) 25.1(L)  Platelets 150 - 400 K/uL - 165 114(L)   No results found for: TSH  Lipid Panel     Component Value Date/Time   CHOL 130 09/26/2014 1116   TRIG 78.0 09/26/2014 1116   HDL 48.50 09/26/2014 1116   CHOLHDL 3 09/26/2014 1116   VLDL 15.6 09/26/2014 1116   LDLCALC 66 09/26/2014 1116   Lab Results  Component Value Date   HGBA1C 8.0* 09/26/2014   RADIOLOGY: No results found.    ASSESSMENT AND PLAN:  Mr. Woodruff Skirvin is an 81 year old gentleman, who has documented normal left ventricular size and function with mild grade 1 diastolic dysfunction. An echo Doppler study in February 2013  showed mild aortic sclerosis, which is contributing to his cardiac murmur.  His murmur today sounds, slightly louder.  He remains asymptomatic with reference to chest pain, PND, orthopnea, or significant shortness of breath, presyncope or syncope.  He has been on simvastatin 40 mg for his hyperlipidemia and recent laboratory reveals good lipid studies with an LDL of 66, normal triglycerides, cholesterol and HDL.  His diabetes recently has not been as tightly controlled as has been in the past.  He will be seeing Dr. Dwyane [name] tomorrow for follow-up.  His heart rhythm is stable and he is tolerating atenolol 25 mg in the morning and 12.5 mg at night.  He takes HCTZ 12.5 mg daily for leg swelling.  Clinically, he continues to do well.  I will see him in 6 months for follow up evaluation and prior to that office visit I am suggesting he undergo a follow-up echo Doppler study to reassess his aortic valve.  Troy Sine, MD, [facility]  11/27/2014 7:07 PM

## 2014-11-27 NOTE — Patient Instructions (Signed)
Your physician recommends that you schedule a follow-up appointment and echo in 6 months.

## 2014-11-28 ENCOUNTER — Ambulatory Visit (INDEPENDENT_AMBULATORY_CARE_PROVIDER_SITE_OTHER): Payer: Medicare Other | Admitting: Endocrinology

## 2014-11-28 ENCOUNTER — Encounter: Payer: Self-pay | Admitting: Endocrinology

## 2014-11-28 VITALS — BP 124/62 | HR 62 | Temp 98.0°F | Resp 16 | Ht 69.0 in | Wt 199.6 lb

## 2014-11-28 DIAGNOSIS — E1165 Type 2 diabetes mellitus with hyperglycemia: Secondary | ICD-10-CM | POA: Diagnosis not present

## 2014-11-28 DIAGNOSIS — I10 Essential (primary) hypertension: Secondary | ICD-10-CM

## 2014-11-28 DIAGNOSIS — IMO0002 Reserved for concepts with insufficient information to code with codable children: Secondary | ICD-10-CM

## 2014-11-28 NOTE — Progress Notes (Signed)
Patient ID: [name], male   DOB: 10-03-1928, 81 y.o.   MRN: [id number]   Reason for Appointment: Diabetes follow-up   History of Present Illness   Diagnosis: Type 2 DIABETES MELITUS, diagnoses 1972   He has had long-standing diabetes and has been on basal bolus insulin regimen after failure of oral hypoglycemic drugs over the last several years His blood sugars have been generally fairly well controlled but has a tendency to high postprandial readings Previously required only a small dose of 5 units of Levemir insulin  Taking Amaryl in addition to his insulin regimen  his blood sugars had improved his blood sugars during the daytime  RECENT history:    He has taken Levemir in the mornings now since blood sugars were relatively higher in the evenings and A1c had gone up Also his dose has been increased to 13 units Doses of Humalog were increased also by 1 unit . Current blood sugar patterns and problems identified  Fasting blood sugars are somewhat high overall with average about 174, previously 145   His blood sugars appear to be periodically high after evening meal but has not checked enough and not clear if he is adjusting the dose based on what he is eating  Blood sugar before supper tend to be low normal and he thinks he did feel hypoglycemic once at about 4 PM progressively higher as the day goes on    Also probably has some high readings after breakfast but has not checked these recently    Oral hypoglycemic drugs: Metformin, 1500 mg , Amaryl 4 mg daily in a.m.        Side effects from medications: None Insulin regimen: Levemir 13 units at 8 am, Humalog 6 a.c before meals            Proper timing of insulin in relation to meals: Yes.         Monitors blood glucose: 1.4 times a day.    Glucometer: One Touch.          Blood Glucose readings from meter download:   PRE-MEAL Breakfast Lunch Dinner Bedtime Overall  Glucose range:  117-227   181, 209   73, 97   59-320     Mean/median: 174     174    Meals: 3 meals per day. Supper at 5-6 pm; breakfast is oatmeal with eggs. Drinks water mostly and no sweet drinks           Physical activity: exercise: exercise bike occasionally        Complications are: Minimal    Eye exam last 7/15, no retinopathy reported   Wt Readings from Last 3 Encounters:  11/28/14 199 lb 9.6 oz (90.538 kg)  11/27/14 198 lb 14.4 oz (90.22 kg)  09/26/14 199 lb 9.6 oz (90.538 kg)   Lab Results  Component Value Date   HGBA1C 8.0* 09/26/2014   HGBA1C 7.7* 06/13/2014   HGBA1C 7.0* 03/06/2014   Lab Results  Component Value Date   MICROALBUR 1.2 09/26/2014   LDLCALC 66 09/26/2014   CREATININE 1.22 09/26/2014       Medication List       This list is accurate as of: 11/28/14  9:27 PM.  Always use your most recent med list.               aspirin 81 MG tablet  Take 81 mg by mouth daily.     atenolol 25 MG tablet  Commonly known as:  TENORMIN  Take 12.5-25 mg by mouth daily. 1 in the morning and one-half at bedtime     azelastine 0.1 % nasal spray  Commonly known as:  ASTELIN  Place 1 spray into both nostrils 2 (two) times daily. Use in each nostril as directed     cholecalciferol 1000 UNITS tablet  Commonly known as:  VITAMIN D  Take 1,000 Units by mouth daily.     docusate sodium 100 MG capsule  Commonly known as:  COLACE  Take 100 mg by mouth 2 (two) times daily.     ferrous gluconate 325 MG tablet  Commonly known as:  FERGON  Take 325 mg by mouth daily with breakfast.     fluticasone 50 MCG/ACT nasal spray  Commonly known as:  FLONASE  Place 2 sprays into both nostrils daily.     glimepiride 4 MG tablet  Commonly known as:  AMARYL  TAKE 1 TABLET (4 MG TOTAL) BY MOUTH DAILY.     hydrochlorothiazide 25 MG tablet  Commonly known as:  HYDRODIURIL  Take 12.5 mg by mouth daily.     Insulin Detemir 100 UNIT/ML Pen  Commonly known as:  LEVEMIR FLEXTOUCH  Inject 13 Units into the skin daily at 10 pm.      insulin lispro 100 UNIT/ML KiwkPen  Commonly known as:  HUMALOG KWIKPEN  Inject 4-5 units three times a day     Insulin Pen Needle 31G X 5 MM Misc  Commonly known as:  B-D UF III MINI PEN NEEDLES  Use 4 times per day     metFORMIN 1000 MG tablet  Commonly known as:  GLUCOPHAGE  Take 1,000 mg by mouth 2 (two) times daily with a meal. Take one tablet in am and 1/2 tablet at bedtime     omeprazole 20 MG capsule  Commonly known as:  PRILOSEC  Take 20 mg by mouth daily.     oxybutynin 10 MG 24 hr tablet  Commonly known as:  DITROPAN-XL     simvastatin 80 MG tablet  Commonly known as:  ZOCOR  Take 40 mg by mouth at bedtime.     VITAMIN B-12 IJ  Inject 1,000 mcg as directed every 21 ( twenty-one) days.        Allergies:  Allergies  Allergen Reactions  . Avodart [Dutasteride] Swelling  . Ibuprofen Nausea And Vomiting  . Morphine Nausea And Vomiting    Past Medical History  Diagnosis Date  . Diabetes mellitus   . Colon polyps   . BPH (benign prostatic hypertrophy)   . Osteoarthritis   . History of kidney stones   . Hypertension   . Hyperlipidemia   . B12 deficiency   . GERD (gastroesophageal reflux disease)     hiatal hernia    Past Surgical History  Procedure Laterality Date  . Appendectomy    . Partial amputaion left foot    . Knee cartilage surgery      Arthroscope  . Transurethral resection of prostate  06/17/2012    Procedure: TRANSURETHRAL RESECTION OF THE PROSTATE WITH GYRUS INSTRUMENTS;  Surgeon: Molli Hazard, MD;  Location: [facility];  Service: Urology;  Laterality: N/A;  . Cystoscopy with biopsy  06/17/2012    Procedure: CYSTOSCOPY WITH BIOPSY;  Surgeon: Molli Hazard, MD;  Location: [facility];  Service: Urology;  Laterality: N/A;  . Carotid doppler  01/27/2011    Right & Left ICAs 0-49% diameter reduction, right & left subclavian arteries demonstrated less than 50% diameter  reduction.  . Cardiovascular stress test  09/02/2011    Post stress  myocardial perfusion images show normal pattern of perfusion in all areas. No significant wall abnormalites noted.  . Transthoracic echocardiogram  09/02/2011    EF >55%, normal LV systolic function, mild diastolic dysfunction    Family History  Problem Relation Age of Onset  . Heart attack Mother   . Hypertension Mother     Social History:  reports that he has quit smoking. He has never used smokeless tobacco. He reports that he does not drink alcohol or use illicit drugs.  Review of Systems:  HYPERTENSION: mild and usually well controlled, periodically checking at home  Blood pressure has been treated by his PCP  HYPERLIPIDEMIA:   Treated with high-dose simvastatin  with good control  Lab Results  Component Value Date   CHOL 130 09/26/2014   HDL 48.50 09/26/2014   LDLCALC 66 09/26/2014   TRIG 78.0 09/26/2014   CHOLHDL 3 09/26/2014        Examination:   BP 124/62 mmHg  Pulse 62  Temp(Src) 98 F (36.7 C)  Resp 16  Ht 5\' 9"  (1.753 m)  Wt 199 lb 9.6 oz (90.538 kg)  BMI 29.46 kg/m2  SpO2 96%  Body mass index is 29.46 kg/(m^2).   No pedal edema  ASSESSMENT/ PLAN:    Diabetes type 2 requiring insulin without significant obesity  The patient's diabetes control is still inadequate His Levemir is clearly not lasting 24 hours as taking it in the morning is causing fasting readings to be high, previously with nighttime dosing his evening sugars were higher Also the few readings he has an late afternoon are low normal and he thinks he has had hypoglycemia once around 4 PM also Last A1c was 8% He has not checked his readings after meals consistently and has difficulty recalling why some of the evenings his sugars were as high as 320 Also has tendency to high readings after breakfast but usually does not check his sugars midmorning He probably has some tendency to forget his insulin at suppertime and possibly other meals especially when eating out Also has had an episode of  hypoglycemia probably from taking his Humalog late in the evening postprandially and exercising on top of that Reassured him that an occasional high reading after meals is not dangerous and he does not need to act on that    He was given a co-pay card for her free box of Humalog pens  However does not want to use syringes for his insulin   Recommendations as in patient instructions:  Change Levemir to twice a day using 11 units in the morning and 4 units in the evening at suppertime  Change Levemir to Toujeo on the next visit and he will be using the free co-pay card that was given.  Discussed differences between this and other basal insulins and he can probably take 15 units in the morning for convenience  Start monitoring blood sugars after various meals or consistently unless in the morning   Humalog 6 at breakfast, 7 at lunch and 8 at supper as before   Advised him to reduce his Levemir in the evening if fasting blood sugars continue to get lower Discussed blood sugar targets at various times including after meals    HYPERTENSION: well controlled and needs to follow-up with PCP periodically also  Patient Instructions  Levemir 11 units at breakfast and 4 units at supper  If am sugar in am  gets < 100 the reduce pm Levemir insulin to 3  Please check blood sugars at least half the time about 2-3 hours after any meal and 3 times per week on waking up.  Please bring blood sugar monitor to each visit.  Recommended blood sugar levels about 2 hours after meal is 140-180 and on waking up 90-130  If missing Humalog at a meal by over 30 min don't take it later      Counseling time on subjects discussed above is over 50% of today's 25 minute visit  Frady Taddeo 11/28/2014, 9:27 PM

## 2014-11-28 NOTE — Patient Instructions (Addendum)
Levemir 11 units at breakfast and 4 units at supper  If am sugar in am gets < 100 the reduce pm Levemir insulin to 3  Please check blood sugars at least half the time about 2-3 hours after any meal and 3 times per week on waking up.  Please bring blood sugar monitor to each visit.  Recommended blood sugar levels about 2 hours after meal is 140-180 and on waking up 90-130  If missing Humalog at a meal by over 30 min don't take it later

## 2014-12-03 ENCOUNTER — Other Ambulatory Visit: Payer: Self-pay | Admitting: Endocrinology

## 2014-12-21 ENCOUNTER — Encounter: Payer: Self-pay | Admitting: Family Medicine

## 2014-12-21 ENCOUNTER — Ambulatory Visit (INDEPENDENT_AMBULATORY_CARE_PROVIDER_SITE_OTHER): Payer: Medicare Other | Admitting: Family Medicine

## 2014-12-21 VITALS — BP 112/58 | HR 66 | Temp 98.4°F | Resp 14 | Ht 69.0 in | Wt 200.0 lb

## 2014-12-21 DIAGNOSIS — T148 Other injury of unspecified body region: Secondary | ICD-10-CM

## 2014-12-21 DIAGNOSIS — L282 Other prurigo: Secondary | ICD-10-CM

## 2014-12-21 DIAGNOSIS — W57XXXA Bitten or stung by nonvenomous insect and other nonvenomous arthropods, initial encounter: Secondary | ICD-10-CM | POA: Diagnosis not present

## 2014-12-21 MED ORDER — MOMETASONE FUROATE 0.1 % EX OINT
TOPICAL_OINTMENT | Freq: Every day | CUTANEOUS | Status: DC
Start: 2014-12-21 — End: 2021-12-12

## 2014-12-21 NOTE — Progress Notes (Signed)
Subjective:    Patient ID: [name], male    DOB: 07-02-1929, 81 y.o.   MRN: [id number]  HPI 2 weeks ago, the patient had a tick that bit him on his medial right biceps. That area still has a 3 mm erythematous papule which is hard. They never saw a spreading red ring or any evidence of erythema migrans. He denies any fevers chills headache neck stiffness myalgias arthralgias consistent with [facility] spotted fever. The tick bite appears to be a simple inflammatory papule. He also has 2 other insect bites on his abdomen near his umbilicus. These are papular urticaria with a central bite and spreading erythema and warmth. He also has numerous erythematous papules on both shins where he has been reading and working in tall grass. These itch. There are 3 or 4 on his left ankle and 3 or 4 on his right ankle. The itching that he has scratched them and created a small scabs. Past Medical History  Diagnosis Date  . Diabetes mellitus   . Colon polyps   . BPH (benign prostatic hypertrophy)   . Osteoarthritis   . History of kidney stones   . Hypertension   . Hyperlipidemia   . B12 deficiency   . GERD (gastroesophageal reflux disease)     hiatal hernia   Past Surgical History  Procedure Laterality Date  . Appendectomy    . Partial amputaion left foot    . Knee cartilage surgery      Arthroscope  . Transurethral resection of prostate  06/17/2012    Procedure: TRANSURETHRAL RESECTION OF THE PROSTATE WITH GYRUS INSTRUMENTS;  Surgeon: Molli Hazard, MD;  Location: [facility];  Service: Urology;  Laterality: N/A;  . Cystoscopy with biopsy  06/17/2012    Procedure: CYSTOSCOPY WITH BIOPSY;  Surgeon: Molli Hazard, MD;  Location: [facility];  Service: Urology;  Laterality: N/A;  . Carotid doppler  01/27/2011    Right & Left ICAs 0-49% diameter reduction, right & left subclavian arteries demonstrated less than 50% diameter reduction.  . Cardiovascular stress test  09/02/2011    Post  stress myocardial perfusion images show normal pattern of perfusion in all areas. No significant wall abnormalites noted.  . Transthoracic echocardiogram  09/02/2011    EF >55%, normal LV systolic function, mild diastolic dysfunction   Current Outpatient Prescriptions on File Prior to Visit  Medication Sig Dispense Refill  . aspirin 81 MG tablet Take 81 mg by mouth daily.    Marland Kitchen atenolol (TENORMIN) 25 MG tablet Take 12.5-25 mg by mouth daily. 1 in the morning and one-half at bedtime    . azelastine (ASTELIN) 0.1 % nasal spray Place 1 spray into both nostrils 2 (two) times daily. Use in each nostril as directed 30 mL 3  . cholecalciferol (VITAMIN D) 1000 UNITS tablet Take 1,000 Units by mouth daily.    . Cyanocobalamin (VITAMIN B-12 IJ) Inject 1,000 mcg as directed every 21 ( twenty-one) days.      Marland Kitchen docusate sodium (COLACE) 100 MG capsule Take 100 mg by mouth 2 (two) times daily.    . ferrous gluconate (FERGON) 325 MG tablet Take 325 mg by mouth daily with breakfast.    . fluticasone (FLONASE) 50 MCG/ACT nasal spray Place 2 sprays into both nostrils daily. 16 g 6  . glimepiride (AMARYL) 4 MG tablet TAKE 1 TABLET (4 MG TOTAL) BY MOUTH DAILY. 30 tablet 5  . hydrochlorothiazide 25 MG tablet Take 12.5 mg by mouth daily.     Marland Kitchen  Insulin Detemir (LEVEMIR FLEXTOUCH) 100 UNIT/ML Pen Inject 13 Units into the skin daily at 10 pm. 15 mL 3  . insulin lispro (HUMALOG KWIKPEN) 100 UNIT/ML KiwkPen Inject 4-5 units three times a day (Patient taking differently: Humalog 6 at breakfast, 7 at lunch and 8 at supper) 15 mL 3  . Insulin Pen Needle (B-D UF III MINI PEN NEEDLES) 31G X 5 MM MISC Use 4 times per day 130 each 3  . metFORMIN (GLUCOPHAGE) 1000 MG tablet Take 1,000 mg by mouth 2 (two) times daily with a meal. Take one tablet in am and 1/2 tablet at bedtime    . omeprazole (PRILOSEC) 20 MG capsule Take 20 mg by mouth daily.      Marland Kitchen oxybutynin (DITROPAN-XL) 10 MG 24 hr tablet     . simvastatin (ZOCOR) 80 MG tablet  Take 40 mg by mouth at bedtime.     No current facility-administered medications on file prior to visit.   Allergies  Allergen Reactions  . Avodart [Dutasteride] Swelling  . Ibuprofen Nausea And Vomiting  . Morphine Nausea And Vomiting   History   Social History  . Marital Status: Married    Spouse Name: N/A  . Number of Children: N/A  . Years of Education: N/A   Occupational History  . Not on file.   Social History Main Topics  . Smoking status: Former Research scientist (life sciences)  . Smokeless tobacco: Never Used  . Alcohol Use: No  . Drug Use: No  . Sexual Activity: Not on file     Comment: married to [place], retired.   Other Topics Concern  . Not on file   Social History Narrative      Review of Systems  All other systems reviewed and are negative.      Objective:   Physical Exam  Constitutional: He appears well-developed and well-nourished. No distress.  Neck: Neck supple.  Cardiovascular: Normal rate, regular rhythm and normal heart sounds.   No murmur heard. Pulmonary/Chest: Effort normal and breath sounds normal. No respiratory distress. He has no wheezes. He has no rales.  Abdominal: Soft. Bowel sounds are normal.  Lymphadenopathy:    He has no cervical adenopathy.  Skin: He is not diaphoretic.  Vitals reviewed.  please see the description in the history of present illness        Assessment & Plan:  Papular urticaria - Plan: mometasone (ELOCON) 0.1 % ointment  Tick bite  There is no evidence of [facility] spotted fever or Lyme disease. I reassured the patient and I anticipate that the erythematous papule on his right bicep will gradually improve with time. There is no evidence of a secondary infection. I do believe the patient has papular urticaria from other insect bites on his abdomen and on his shins and possibly an irritant dermatitis on his shins from weed eating, etc.  begin Elocon ointment once or twice daily as needed for itching or for rash. He can use  this for the next week. Call me immediately if he develops a spreading red rash or if he develops flulike symptoms.

## 2014-12-26 ENCOUNTER — Other Ambulatory Visit: Payer: Self-pay | Admitting: Endocrinology

## 2014-12-26 ENCOUNTER — Ambulatory Visit (INDEPENDENT_AMBULATORY_CARE_PROVIDER_SITE_OTHER): Payer: Medicare Other | Admitting: Podiatry

## 2014-12-26 ENCOUNTER — Encounter: Payer: Self-pay | Admitting: Podiatry

## 2014-12-26 DIAGNOSIS — M79673 Pain in unspecified foot: Secondary | ICD-10-CM

## 2014-12-26 DIAGNOSIS — B351 Tinea unguium: Secondary | ICD-10-CM

## 2014-12-26 DIAGNOSIS — E1140 Type 2 diabetes mellitus with diabetic neuropathy, unspecified: Secondary | ICD-10-CM

## 2014-12-26 DIAGNOSIS — Q828 Other specified congenital malformations of skin: Secondary | ICD-10-CM

## 2014-12-26 NOTE — Progress Notes (Signed)
Patient ID: [name], male   DOB: 23-Sep-1928, 81 y.o.   MRN: [id number] Complaint:  Visit Type: Patient returns to my office for continued preventative foot care services. 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  He presents for preventative foot care services. No changes to ROS  Podiatric Exam: Vascular: dorsalis pedis and posterior tibial pulses are palpable bilateral. Capillary return is immediate. Temperature gradient is WNL. Skin turgor WNL  Sensorium: Normal Semmes Weinstein monofilament test. Normal tactile sensation bilaterally. Nail Exam: Pt has thick disfigured discolored nails with subungual debris noted 1-5 right and 3-5 left Ulcer Exam: There is no evidence of ulcer or pre-ulcerative changes or infection. Orthopedic Exam: Muscle tone and strength are WNL. No limitations in general ROM. No crepitus or effusions noted. Foot type and digits show no abnormalities. Bony prominences are unremarkable. Skin:  Porokeratosis  Plantar and distal left hallux left foot.. No infection or ulcers  Diagnosis:  Tinea unguium, Pain in right toe, pain in left toes, Porokeratosois left hallux  Treatment & Plan Procedures and Treatment: Consent by patient was obtained for treatment procedures. The patient understood the discussion of treatment and procedures well. All questions were answered thoroughly reviewed. Debridement of mycotic and hypertrophic toenails,l and clearing of subungual debris. No ulceration, no infection noted. Porokeratosis debrided left halluxReturn Visit-Office Procedure: Patient instructed to return to the office for a follow up visit 3 months for continued evaluation and treatment.

## 2015-01-31 ENCOUNTER — Ambulatory Visit (INDEPENDENT_AMBULATORY_CARE_PROVIDER_SITE_OTHER): Payer: Medicare Other | Admitting: Endocrinology

## 2015-01-31 ENCOUNTER — Encounter: Payer: Self-pay | Admitting: Endocrinology

## 2015-01-31 ENCOUNTER — Other Ambulatory Visit: Payer: Self-pay | Admitting: *Deleted

## 2015-01-31 VITALS — BP 136/70 | HR 61 | Temp 98.3°F | Resp 16 | Ht 69.0 in | Wt 197.6 lb

## 2015-01-31 DIAGNOSIS — IMO0002 Reserved for concepts with insufficient information to code with codable children: Secondary | ICD-10-CM

## 2015-01-31 DIAGNOSIS — E785 Hyperlipidemia, unspecified: Secondary | ICD-10-CM

## 2015-01-31 DIAGNOSIS — E1165 Type 2 diabetes mellitus with hyperglycemia: Secondary | ICD-10-CM

## 2015-01-31 DIAGNOSIS — Z23 Encounter for immunization: Secondary | ICD-10-CM

## 2015-01-31 LAB — COMPREHENSIVE METABOLIC PANEL
ALBUMIN: 4.1 g/dL (ref 3.5–5.2)
ALT: 25 U/L (ref 0–53)
AST: 40 U/L — ABNORMAL HIGH (ref 0–37)
Alkaline Phosphatase: 68 U/L (ref 39–117)
BILIRUBIN TOTAL: 0.6 mg/dL (ref 0.2–1.2)
BUN: 29 mg/dL — ABNORMAL HIGH (ref 6–23)
CO2: 28 meq/L (ref 19–32)
CREATININE: 1.29 mg/dL (ref 0.40–1.50)
Calcium: 9.4 mg/dL (ref 8.4–10.5)
Chloride: 104 mEq/L (ref 96–112)
GFR: 56.43 mL/min — AB (ref >60.00)
Glucose, Bld: 129 mg/dL — ABNORMAL HIGH (ref 70–99)
Potassium: 3.9 mEq/L (ref 3.5–5.1)
Sodium: 142 mEq/L (ref 135–145)
Total Protein: 7.2 g/dL (ref 6.0–8.3)

## 2015-01-31 LAB — HEMOGLOBIN A1C: Hgb A1c MFr Bld: 7.1 % — ABNORMAL HIGH (ref 4.6–6.5)

## 2015-01-31 NOTE — Patient Instructions (Addendum)
Reduce Humalog at supper to 6 units unless eating large meal/more Carbs  Toujeo insulin replaces Levemir 15 units in am only

## 2015-01-31 NOTE — Progress Notes (Signed)
Patient ID: JUVON TEATER, male   DOB: [birth year], 81 y.o.   MRN: [id number]   Reason for Appointment: Diabetes follow-up   History of Present Illness   Diagnosis: Type 2 DIABETES MELITUS, diagnoses 1972   He has had long-standing diabetes and has been on basal bolus insulin regimen after failure of oral hypoglycemic drugs over the last several years His blood sugars have been generally fairly well controlled but has a tendency to high postprandial readings Previously required only a small dose of 5 units of Levemir insulin  Taking Amaryl in addition to his insulin regimen  his blood sugars had improved his blood sugars during the daytime  RECENT history:    He has taken Levemir twice a day since his last visit because of relatively higher fasting readings with taking the morning dose only He was told to switch to [facility] but he still has a supply of Levemir at this time Doses of Humalog were increased also at lunch and suppertime to control high postprandial readings His A1c was up to 8% in March and needs follow-up level . Current blood sugar patterns and problems identified  Fasting blood sugars are somewhat high on average but not consistent and overall better than before  He has checked only sporadic readings around lunch and suppertime  He cannot explain why he has some readings around 300 twice at lunchtime, his wife thinks that he may have forgotten his insulin on morning  Also has occasional high readings at suppertime  He has not done any readings after supper except twice recently  He claims that his blood sugars are low late in the evenings but is unclear about what time it gets low; does not think blood sugars in the low during the night when he is asleep  His activity level is variable but may be doing more outside activity in the afternoons, not doing any formal exercise now  Again does not do any postprandial readings after breakfast or lunch as directed  Oral  hypoglycemic drugs: Metformin, 1500 mg , Amaryl 4 mg daily in a.m.        Side effects from medications: None  Insulin regimen: Levemir 11 units at 8 am 4 units in p.m. , Humalog 6-7-8  a.c before meals            Proper timing of insulin in relation to meals: Yes.         Monitors blood glucose: 1.4 times a day.    Glucometer: One Touch.          Blood Glucose readings from meter download:   Mean values apply above for all meters except median for One Touch  PRE-MEAL Fasting Lunch Dinner  PCS  Overall  Glucose range:  114-182   97-336   92-231   58, 139    Mean/median: 150    150   Meals: 3 meals per day. Lunch 12 noon Supper at 5-6 pm; breakfast is oatmeal with eggs. Drinks water mostly and no sweet drinks           Physical activity: exercise: none     Complications are: Minimal    Eye exam last 7/15, no retinopathy reported   Wt Readings from Last 3 Encounters:  01/31/15 197 lb 9.6 oz (89.631 kg)  12/21/14 200 lb (90.719 kg)  11/28/14 199 lb 9.6 oz (90.538 kg)   Lab Results  Component Value Date   HGBA1C 8.0* 09/26/2014   HGBA1C 7.7* 06/13/2014  HGBA1C 7.0* 03/06/2014   Lab Results  Component Value Date   MICROALBUR 1.2 09/26/2014   LDLCALC 66 09/26/2014   CREATININE 1.22 09/26/2014       Medication List       This list is accurate as of: 01/31/15  1:11 PM.  Always use your most recent med list.               aspirin 81 MG tablet  Take 81 mg by mouth daily.     atenolol 25 MG tablet  Commonly known as:  TENORMIN  Take 12.5-25 mg by mouth daily. 1 in the morning and one-half at bedtime     azelastine 0.1 % nasal spray  Commonly known as:  ASTELIN  Place 1 spray into both nostrils 2 (two) times daily. Use in each nostril as directed     B-D UF III MINI PEN NEEDLES 31G X 5 MM Misc  Generic drug:  Insulin Pen Needle  USE 4 TIMES PER DAY     cholecalciferol 1000 UNITS tablet  Commonly known as:  VITAMIN D  Take 1,000 Units by mouth daily.     docusate  sodium 100 MG capsule  Commonly known as:  COLACE  Take 100 mg by mouth 2 (two) times daily.     ferrous gluconate 325 MG tablet  Commonly known as:  FERGON  Take 325 mg by mouth daily with breakfast.     fluticasone 50 MCG/ACT nasal spray  Commonly known as:  FLONASE  Place 2 sprays into both nostrils daily.     glimepiride 4 MG tablet  Commonly known as:  AMARYL  TAKE 1 TABLET (4 MG TOTAL) BY MOUTH DAILY.     hydrochlorothiazide 25 MG tablet  Commonly known as:  HYDRODIURIL  Take 12.5 mg by mouth daily.     Insulin Detemir 100 UNIT/ML Pen  Commonly known as:  LEVEMIR FLEXTOUCH  Inject 13 Units into the skin daily at 10 pm.     insulin lispro 100 UNIT/ML KiwkPen  Commonly known as:  HUMALOG KWIKPEN  Inject 4-5 units three times a day     metFORMIN 1000 MG tablet  Commonly known as:  GLUCOPHAGE  Take 1,000 mg by mouth 2 (two) times daily with a meal. Take one tablet in am and 1/2 tablet at bedtime     mometasone 0.1 % ointment  Commonly known as:  ELOCON  Apply topically daily.     omeprazole 20 MG capsule  Commonly known as:  PRILOSEC  Take 20 mg by mouth daily.     simvastatin 80 MG tablet  Commonly known as:  ZOCOR  Take 40 mg by mouth at bedtime.     VITAMIN B-12 IJ  Inject 1,000 mcg as directed every 21 ( twenty-one) days.        Allergies:  Allergies  Allergen Reactions  . Avodart [Dutasteride] Swelling  . Ibuprofen Nausea And Vomiting  . Morphine Nausea And Vomiting    Past Medical History  Diagnosis Date  . Diabetes mellitus   . Colon polyps   . BPH (benign prostatic hypertrophy)   . Osteoarthritis   . History of kidney stones   . Hypertension   . Hyperlipidemia   . B12 deficiency   . GERD (gastroesophageal reflux disease)     hiatal hernia    Past Surgical History  Procedure Laterality Date  . Appendectomy    . Partial amputaion left foot    . Knee cartilage surgery  Arthroscope  . Transurethral resection of prostate   06/17/2012    Procedure: TRANSURETHRAL RESECTION OF THE PROSTATE WITH GYRUS INSTRUMENTS;  Surgeon: Molli Hazard, MD;  Location: [facility];  Service: Urology;  Laterality: N/A;  . Cystoscopy with biopsy  06/17/2012    Procedure: CYSTOSCOPY WITH BIOPSY;  Surgeon: Molli Hazard, MD;  Location: [facility];  Service: Urology;  Laterality: N/A;  . Carotid doppler  01/27/2011    Right & Left ICAs 0-49% diameter reduction, right & left subclavian arteries demonstrated less than 50% diameter reduction.  . Cardiovascular stress test  09/02/2011    Post stress myocardial perfusion images show normal pattern of perfusion in all areas. No significant wall abnormalites noted.  . Transthoracic echocardiogram  09/02/2011    EF >55%, normal LV systolic function, mild diastolic dysfunction    Family History  Problem Relation Age of Onset  . Heart attack Mother   . Hypertension Mother     Social History:  reports that he has quit smoking. He has never used smokeless tobacco. He reports that he does not drink alcohol or use illicit drugs.  Review of Systems:  HYPERTENSION: mild and usually well controlled, periodically checking at home  Blood pressure has been treated by his PCP  HYPERLIPIDEMIA:   Treated with high-dose simvastatin  with good control  Lab Results  Component Value Date   CHOL 130 09/26/2014   HDL 48.50 09/26/2014   LDLCALC 66 09/26/2014   TRIG 78.0 09/26/2014   CHOLHDL 3 09/26/2014        Examination:   BP 136/70 mmHg  Pulse 61  Temp(Src) 98.3 F (36.8 C)  Resp 16  Ht 5\' 9"  (1.753 m)  Wt 197 lb 9.6 oz (89.631 kg)  BMI 29.17 kg/m2  SpO2 94%  Body mass index is 29.17 kg/(m^2).   No pedal edema  ASSESSMENT/ PLAN:    Diabetes type 2 requiring insulin   See history of present illness for detailed discussion of his current management, blood sugar patterns and problems identified  The patient's diabetes control is somewhat better as judged by his home  readings However will need to check an A1c to confirm improved control He has some difficulty understanding and following directions for his insulin and glucose monitoring as discussed above Also maybe occasionally noncompliant with taking his insulin and blood sugars may be as high as 336 from this  Currently he thinks he has been getting some low blood sugars at bedtime and may be able to take less coverage for his evening meal As before his blood sugars are fluctuating at various times and he cannot explain why   Recommendations as in patient instructions:  Continue Levemir twice a unchanged for now until he finishes his current supply  Change Levemir to Toujeo when he runs out of Levemir and he can start with 15 units in the morning.  Discussed differences between this and Levemir and he can use the same pen needles.  Brochure on Toujeo given.  If his fasting blood sugars are higher he may need to increase the dose, discussed  Start monitoring blood sugars after various meals or consistently unless in the morning   Humalog 6 at breakfast, 7 at lunch and 6  at supper; may adjust the dose at suppertime if eating a larger meal  Discussed blood sugar targets at various times including after meals    He was given a co-pay card for  free box of Toujeo   HYPERTENSION: well  controlled   PREVENTIVE care: He is overdue for Prevnar and will give this today  Patient Instructions  Reduce Humalog at supper to 6 units unless eating large meal/more Carbs  Toujeo insulin replaces Levemir 15 units in am only     Counseling time on subjects discussed above is over 50% of today's 25 minute visit  Deshauna Cayson 01/31/2015, 1:11 PM

## 2015-02-01 NOTE — Progress Notes (Signed)
Quick Note:  Please let patient know that the A1c is much better at 7.1   ______

## 2015-02-02 ENCOUNTER — Encounter: Payer: Self-pay | Admitting: *Deleted

## 2015-02-12 ENCOUNTER — Ambulatory Visit (INDEPENDENT_AMBULATORY_CARE_PROVIDER_SITE_OTHER): Payer: Medicare Other | Admitting: *Deleted

## 2015-02-12 DIAGNOSIS — E559 Vitamin D deficiency, unspecified: Secondary | ICD-10-CM

## 2015-02-12 MED ORDER — CYANOCOBALAMIN 1000 MCG/ML IJ SOLN
1000.0000 ug | Freq: Once | INTRAMUSCULAR | Status: AC
  Administered 2015-02-12: 1000 ug via INTRAMUSCULAR

## 2015-02-22 ENCOUNTER — Other Ambulatory Visit: Payer: Self-pay | Admitting: *Deleted

## 2015-02-22 MED ORDER — INSULIN PEN NEEDLE 31G X 5 MM MISC: Status: DC

## 2015-02-26 ENCOUNTER — Other Ambulatory Visit: Payer: Self-pay | Admitting: *Deleted

## 2015-02-26 MED ORDER — INSULIN GLARGINE 300 UNIT/ML ~~LOC~~ SOPN: 15.0000 [IU] | PEN_INJECTOR | Freq: Every morning | SUBCUTANEOUS | Status: DC

## 2015-03-01 DIAGNOSIS — E119 Type 2 diabetes mellitus without complications: Secondary | ICD-10-CM | POA: Diagnosis not present

## 2015-03-01 DIAGNOSIS — Z961 Presence of intraocular lens: Secondary | ICD-10-CM | POA: Diagnosis not present

## 2015-03-01 DIAGNOSIS — H26492 Other secondary cataract, left eye: Secondary | ICD-10-CM | POA: Diagnosis not present

## 2015-03-01 DIAGNOSIS — H40013 Open angle with borderline findings, low risk, bilateral: Secondary | ICD-10-CM | POA: Diagnosis not present

## 2015-03-01 LAB — HM DIABETES EYE EXAM

## 2015-03-05 ENCOUNTER — Encounter: Payer: Self-pay | Admitting: Family Medicine

## 2015-03-30 ENCOUNTER — Ambulatory Visit (INDEPENDENT_AMBULATORY_CARE_PROVIDER_SITE_OTHER): Payer: Medicare Other | Admitting: *Deleted

## 2015-03-30 DIAGNOSIS — E538 Deficiency of other specified B group vitamins: Secondary | ICD-10-CM | POA: Diagnosis not present

## 2015-03-30 MED ORDER — CYANOCOBALAMIN 1000 MCG/ML IJ SOLN
1000.0000 ug | Freq: Once | INTRAMUSCULAR | Status: AC
  Administered 2015-03-30: 1000 ug via INTRAMUSCULAR

## 2015-04-03 ENCOUNTER — Encounter: Payer: Self-pay | Admitting: Podiatry

## 2015-04-03 ENCOUNTER — Ambulatory Visit (INDEPENDENT_AMBULATORY_CARE_PROVIDER_SITE_OTHER): Payer: Medicare Other | Admitting: Podiatry

## 2015-04-03 DIAGNOSIS — M79673 Pain in unspecified foot: Secondary | ICD-10-CM

## 2015-04-03 DIAGNOSIS — E1140 Type 2 diabetes mellitus with diabetic neuropathy, unspecified: Secondary | ICD-10-CM

## 2015-04-03 DIAGNOSIS — B351 Tinea unguium: Secondary | ICD-10-CM

## 2015-04-03 DIAGNOSIS — Q828 Other specified congenital malformations of skin: Secondary | ICD-10-CM | POA: Diagnosis not present

## 2015-04-03 DIAGNOSIS — M79609 Pain in unspecified limb: Secondary | ICD-10-CM

## 2015-04-03 NOTE — Progress Notes (Signed)
Patient ID: [name], male   DOB: 09/09/1928, 81 y.o.   MRN: [id number] Complaint:  Visit Type: Patient returns to my office for continued preventative foot care services. 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  He presents for preventative foot care services. No changes to ROS  Podiatric Exam: Vascular: dorsalis pedis and posterior tibial pulses are palpable bilateral. Capillary return is immediate. Temperature gradient is WNL. Skin turgor WNL  Sensorium: Normal Semmes Weinstein monofilament test. Normal tactile sensation bilaterally. Nail Exam: Pt has thick disfigured discolored nails with subungual debris noted 1-5 right and 3-5 left Ulcer Exam: There is no evidence of ulcer or pre-ulcerative changes or infection. Orthopedic Exam: Muscle tone and strength are WNL. No limitations in general ROM. No crepitus or effusions noted. Foot type and digits show no abnormalities. Bony prominences are unremarkable. Skin:  Porokeratosis  Plantar and distal left hallux left foot.. No infection or ulcers  Diagnosis:  Tinea unguium, Pain in right toe, pain in left toes, Porokeratosois left hallux  Treatment & Plan Procedures and Treatment: Consent by patient was obtained for treatment procedures. The patient understood the discussion of treatment and procedures well. All questions were answered thoroughly reviewed. Debridement of mycotic and hypertrophic toenails,l and clearing of subungual debris. No ulceration, no infection noted. Porokeratosis debrided left halluxReturn Visit-Office Procedure: Patient instructed to return to the office for a follow up visit 3 months for continued evaluation and treatment. 

## 2015-04-26 ENCOUNTER — Ambulatory Visit (INDEPENDENT_AMBULATORY_CARE_PROVIDER_SITE_OTHER): Payer: Medicare Other | Admitting: *Deleted

## 2015-04-26 DIAGNOSIS — E538 Deficiency of other specified B group vitamins: Secondary | ICD-10-CM | POA: Diagnosis not present

## 2015-04-26 DIAGNOSIS — Z23 Encounter for immunization: Secondary | ICD-10-CM

## 2015-04-26 MED ORDER — CYANOCOBALAMIN 1000 MCG/ML IJ SOLN
1000.0000 ug | Freq: Once | INTRAMUSCULAR | Status: AC
  Administered 2015-04-26: 1000 ug via INTRAMUSCULAR

## 2015-04-26 NOTE — Progress Notes (Signed)
Patient ID: [name], male   DOB: [birth year], 81 y.o.   MRN: [id number]  Patient seen in office for Vitamin B12 injection.   Tolerated IM administration well.

## 2015-04-26 NOTE — Progress Notes (Signed)
Patient ID: [name], male   DOB: 06/16/1929, 81 y.o.   MRN: [id number]  Patient seen in office for Influenza Vaccination.   Tolerated IM administration well.   Immunization history updated.

## 2015-05-03 ENCOUNTER — Encounter: Payer: Self-pay | Admitting: Endocrinology

## 2015-05-03 ENCOUNTER — Ambulatory Visit (INDEPENDENT_AMBULATORY_CARE_PROVIDER_SITE_OTHER): Payer: Medicare Other | Admitting: Endocrinology

## 2015-05-03 ENCOUNTER — Other Ambulatory Visit (INDEPENDENT_AMBULATORY_CARE_PROVIDER_SITE_OTHER): Payer: Medicare Other

## 2015-05-03 VITALS — BP 137/64 | HR 68 | Temp 98.2°F | Resp 16 | Ht 69.0 in | Wt 201.0 lb

## 2015-05-03 DIAGNOSIS — E1165 Type 2 diabetes mellitus with hyperglycemia: Secondary | ICD-10-CM

## 2015-05-03 DIAGNOSIS — IMO0002 Reserved for concepts with insufficient information to code with codable children: Secondary | ICD-10-CM

## 2015-05-03 LAB — BASIC METABOLIC PANEL
BUN: 21 mg/dL (ref 6–23)
CALCIUM: 9.2 mg/dL (ref 8.4–10.5)
CO2: 29 mEq/L (ref 19–32)
Chloride: 104 mEq/L (ref 96–112)
Creatinine, Ser: 1.33 mg/dL (ref 0.40–1.50)
GFR: 54.44 mL/min — AB (ref >60.00)
GLUCOSE: 206 mg/dL — AB (ref 70–99)
Potassium: 4.6 mEq/L (ref 3.5–5.1)
Sodium: 140 mEq/L (ref 135–145)

## 2015-05-03 LAB — POCT GLYCOSYLATED HEMOGLOBIN (HGB A1C): Hemoglobin A1C: 7.4

## 2015-05-03 LAB — HEMOGLOBIN A1C: HEMOGLOBIN A1C: 7.8 % — AB (ref 4.6–6.5)

## 2015-05-03 NOTE — Patient Instructions (Signed)
Check blood sugars on waking up .Marland Kitchen 3-4 .Marland Kitchen times a week Also check blood sugars about 2 hours after a meal and do this after different meals by rotation  Recommended blood sugar levels on waking up is 90-130 and about 2 hours after meal is 140-180 Please bring blood sugar monitor to each visit.  CHANGE TOUJEO to 14 units at supper  Novolog 5 at breakfast, 4 at lunch and 6 at supper, change based on food and sugar level

## 2015-05-03 NOTE — Progress Notes (Signed)
Patient ID: [name], male   DOB: 1929-01-21, 81 y.o.   MRN: [id number]   Reason for Appointment: Diabetes follow-up   History of Present Illness   Diagnosis: Type 2 DIABETES MELITUS, diagnoses 1972   He has had long-standing diabetes and has been on basal bolus insulin regimen after failure of oral hypoglycemic drugs over the last several years His blood sugars have been generally fairly well controlled but has a tendency to high postprandial readings Previously required only a small dose of 5 units of Levemir insulin  Taking Amaryl in addition to his insulin regimen  his blood sugars had improved his blood sugars during the daytime  RECENT history:    He has taken Levemir twice a day but was finally able to switch to Toujeo about 3 weeks ago However not clear why his blood sugars had been much higher about a month ago including fasting He was told to go up on the dose of Humalog at suppertime but he still taking relatively low doses on his own His A1c was up to 8% in March and subsequently better, now higher again at 7.4 . Current blood sugar patterns and problems identified  Fasting blood sugars are still somewhat high in the last few days but generally better and as low as 127  Blood sugars are about the same around lunchtime  Blood sugars are usually better by suppertime since he is more active in the afternoons  Has checked blood sugars only sporadically after supper and these are mostly higher around bedtime  He does not increase his insulin at supper time even when he is eating larger meals  He is still taking oral hypoglycemic drugs  Again does not do any postprandial readings after breakfast or lunch as directed  Oral hypoglycemic drugs: Metformin, 1500 mg , Amaryl 4 mg daily in a.m.        Side effects from medications: None  Insulin regimen: Toujeo 14 units at 8 am   Humalog 5-6  a.c before meals            Proper timing of insulin in relation to meals:  Yes.         Monitors blood glucose: 1.4 times a day.    Glucometer: One Touch.          Blood Glucose readings from meter download:   Mean values apply above for all meters except median for One Touch  PRE-MEAL Fasting Lunch Dinner Bedtime Overall  Glucose range:  131-255   131-284   84-254   116-292    Mean/median:  182   181   158    183     Meals: 3 meals per day. Lunch 12 noon Supper at 5-6 pm; breakfast is oatmeal with eggs.  Drinks water mostly and no sweet drinks           Physical activity: exercise: chopping wood and other outside activities    Complications are: Minimal    Eye exam last 7/15, no retinopathy reported   Wt Readings from Last 3 Encounters:  05/03/15 201 lb (91.173 kg)  01/31/15 197 lb 9.6 oz (89.631 kg)  12/21/14 200 lb (90.719 kg)   Lab Results  Component Value Date   HGBA1C 7.4 05/03/2015   HGBA1C 7.8* 05/03/2015   HGBA1C 7.1* 01/31/2015   Lab Results  Component Value Date   MICROALBUR 1.2 09/26/2014   LDLCALC 66 09/26/2014   CREATININE 1.33 05/03/2015       Medication  List       This list is accurate as of: 05/03/15  3:24 PM.  Always use your most recent med list.               aspirin 81 MG tablet  Take 81 mg by mouth daily.     atenolol 25 MG tablet  Commonly known as:  TENORMIN  Take 12.5-25 mg by mouth daily. 1 in the morning and one-half at bedtime     azelastine 0.1 % nasal spray  Commonly known as:  ASTELIN  Place 1 spray into both nostrils 2 (two) times daily. Use in each nostril as directed     cholecalciferol 1000 UNITS tablet  Commonly known as:  VITAMIN D  Take 1,000 Units by mouth daily.     docusate sodium 100 MG capsule  Commonly known as:  COLACE  Take 100 mg by mouth 2 (two) times daily.     ferrous gluconate 325 MG tablet  Commonly known as:  FERGON  Take 325 mg by mouth daily with breakfast.     fluticasone 50 MCG/ACT nasal spray  Commonly known as:  FLONASE  Place 2 sprays into both nostrils daily.      glimepiride 4 MG tablet  Commonly known as:  AMARYL  TAKE 1 TABLET (4 MG TOTAL) BY MOUTH DAILY.     hydrochlorothiazide 25 MG tablet  Commonly known as:  HYDRODIURIL  Take 12.5 mg by mouth daily.     Insulin Glargine 300 UNIT/ML Sopn  Commonly known as:  TOUJEO SOLOSTAR  Inject 15 Units into the skin every morning.     insulin lispro 100 UNIT/ML KiwkPen  Commonly known as:  HUMALOG KWIKPEN  Inject 4-5 units three times a day     Insulin Pen Needle 31G X 5 MM Misc  Commonly known as:  B-D UF III MINI PEN NEEDLES  USE 4 TIMES PER DAY     metFORMIN 1000 MG tablet  Commonly known as:  GLUCOPHAGE  Take 1,000 mg by mouth 2 (two) times daily with a meal. Take one tablet in am and 1/2 tablet at bedtime     mometasone 0.1 % ointment  Commonly known as:  ELOCON  Apply topically daily.     omeprazole 20 MG capsule  Commonly known as:  PRILOSEC  Take 20 mg by mouth daily.     simvastatin 80 MG tablet  Commonly known as:  ZOCOR  Take 40 mg by mouth at bedtime.     VITAMIN B-12 IJ  Inject 1,000 mcg as directed every 21 ( twenty-one) days.        Allergies:  Allergies  Allergen Reactions  . Avodart [Dutasteride] Swelling  . Ibuprofen Nausea And Vomiting  . Morphine Nausea And Vomiting    Past Medical History  Diagnosis Date  . Diabetes mellitus   . Colon polyps   . BPH (benign prostatic hypertrophy)   . Osteoarthritis   . History of kidney stones   . Hypertension   . Hyperlipidemia   . B12 deficiency   . GERD (gastroesophageal reflux disease)     hiatal hernia    Past Surgical History  Procedure Laterality Date  . Appendectomy    . Partial amputaion left foot    . Knee cartilage surgery      Arthroscope  . Transurethral resection of prostate  06/17/2012    Procedure: TRANSURETHRAL RESECTION OF THE PROSTATE WITH GYRUS INSTRUMENTS;  Surgeon: Molli Hazard, MD;  Location: [facility];  Service: Urology;  Laterality: N/A;  . Cystoscopy with biopsy   06/17/2012    Procedure: CYSTOSCOPY WITH BIOPSY;  Surgeon: Molli Hazard, MD;  Location: [facility];  Service: Urology;  Laterality: N/A;  . Carotid doppler  01/27/2011    Right & Left ICAs 0-49% diameter reduction, right & left subclavian arteries demonstrated less than 50% diameter reduction.  . Cardiovascular stress test  09/02/2011    Post stress myocardial perfusion images show normal pattern of perfusion in all areas. No significant wall abnormalites noted.  . Transthoracic echocardiogram  09/02/2011    EF >55%, normal LV systolic function, mild diastolic dysfunction    Family History  Problem Relation Age of Onset  . Heart attack Mother   . Hypertension Mother     Social History:  reports that he has quit smoking. He has never used smokeless tobacco. He reports that he does not drink alcohol or use illicit drugs.  Review of Systems:  HYPERTENSION: mild and usually well controlled, fairly consistent with checking at home  Blood pressure has been treated by his PCP  HYPERLIPIDEMIA:   Treated with high-dose simvastatin  with good control  Lab Results  Component Value Date   CHOL 130 09/26/2014   HDL 48.50 09/26/2014   LDLCALC 66 09/26/2014   TRIG 78.0 09/26/2014   CHOLHDL 3 09/26/2014        Examination:   BP 137/64 mmHg  Pulse 68  Temp(Src) 98.2 F (36.8 C)  Resp 16  Ht 5\' 9"  (1.753 m)  Wt 201 lb (91.173 kg)  BMI 29.67 kg/m2  SpO2 97%  Body mass index is 29.67 kg/(m^2).    ASSESSMENT/ PLAN:    Diabetes type 2 requiring insulin   See history of present illness for detailed discussion of his current management, blood sugar patterns and problems identified  The patient's diabetes control is not as good with A1c going up However he has just switched to Toujeo and blood sugars are starting to improve Currently blood sugars are the highest at bedtime, still relatively high at fasting and low normal at times before supper Probably has better readings in the  afternoon because of increased physical activities  Also did not check enough readings after meals   Recommendations as in patient instructions:  Continue Toujeo 14 units, take this dose consistently and switch to the evening at suppertime  Call if fasting blood sugars stay high  Continue metformin and Amaryl  Start monitoring blood sugars after various meals about 2 hours later   Humalog 5 at breakfast, 4 at lunch and at least 6  at supper; may adjust the dose at suppertime if eating a larger meal  Discussed blood sugar targets at various times including after meals    HYPERTENSION: well controlled    Patient Instructions  Check blood sugars on waking up .Marland Kitchen 3-4 .Marland Kitchen times a week Also check blood sugars about 2 hours after a meal and do this after different meals by rotation  Recommended blood sugar levels on waking up is 90-130 and about 2 hours after meal is 140-180 Please bring blood sugar monitor to each visit.  CHANGE TOUJEO to 14 units at supper  Novolog 5 at breakfast, 4 at lunch and 6 at supper, change based on food and sugar level      Counseling time on subjects discussed above is over 50% of today's 25 minute visit  Fergie Sherbert 05/03/2015, 3:24 PM

## 2015-05-21 ENCOUNTER — Ambulatory Visit (INDEPENDENT_AMBULATORY_CARE_PROVIDER_SITE_OTHER): Payer: Medicare Other | Admitting: Cardiovascular Disease

## 2015-05-21 ENCOUNTER — Encounter: Payer: Self-pay | Admitting: Cardiovascular Disease

## 2015-05-21 VITALS — BP 120/64 | HR 61 | Ht 69.0 in | Wt 200.0 lb

## 2015-05-21 DIAGNOSIS — E785 Hyperlipidemia, unspecified: Secondary | ICD-10-CM

## 2015-05-21 DIAGNOSIS — I700 Atherosclerosis of aorta: Secondary | ICD-10-CM | POA: Diagnosis not present

## 2015-05-21 DIAGNOSIS — I10 Essential (primary) hypertension: Secondary | ICD-10-CM | POA: Diagnosis not present

## 2015-05-21 DIAGNOSIS — I491 Atrial premature depolarization: Secondary | ICD-10-CM | POA: Diagnosis not present

## 2015-05-21 DIAGNOSIS — IMO0001 Reserved for inherently not codable concepts without codable children: Secondary | ICD-10-CM

## 2015-05-21 NOTE — Progress Notes (Signed)
Patient ID: [name], male   DOB: [birth year], 81 y.o.   MRN: [id number]      HPI: [name] is a 81 y.o. male who presents for a 6 month cardiology evaluation.  [name] has a history of type 2 diabetes mellitus, hypertension, hyperlipidemia, previously documented grade 1 diastolic dysfunction, and intermittent peripheral edema.  He has been on beta blocker therapy, which has controlled a prior episode of atrial tachycardia, which occurred with stress.  He goes to the [facility] yearly and gets his medications at the [facility].  He has remained relatively stable over the past year.  He is status post TURP has had continues continuous difficulty with urinary incontinence for which she is required to wear a diaper.  He denies any episodes of chest pain.  He denies PND, orthopnea.  He is followed by Dr. Dwyane Dee for his diabetes mellitus and has been on insulin, chlamydia per minute therapy.  He has a history of hyperlipidemia and has tolerated simvastatin 40 mg daily.  He denies any episodes of chest pain or shortness of breath.  At times he notes only trivial ankle swelling.  2 weeks ago.  He walks 6 miles in a crop walk and did well without chest pain, development.  Past Medical History  Diagnosis Date  . Diabetes mellitus   . Colon polyps   . BPH (benign prostatic hypertrophy)   . Osteoarthritis   . History of kidney stones   . Hypertension   . Hyperlipidemia   . B12 deficiency   . GERD (gastroesophageal reflux disease)     hiatal hernia    Past Surgical History  Procedure Laterality Date  . Appendectomy    . Partial amputaion left foot    . Knee cartilage surgery      Arthroscope  . Transurethral resection of prostate  06/17/2012    Procedure: TRANSURETHRAL RESECTION OF THE PROSTATE WITH GYRUS INSTRUMENTS;  Surgeon: Molli Hazard, MD;  Location: [facility];  Service: Urology;  Laterality: N/A;  . Cystoscopy with biopsy  06/17/2012    Procedure: CYSTOSCOPY  WITH BIOPSY;  Surgeon: Molli Hazard, MD;  Location: [facility];  Service: Urology;  Laterality: N/A;  . Carotid doppler  01/27/2011    Right & Left ICAs 0-49% diameter reduction, right & left subclavian arteries demonstrated less than 50% diameter reduction.  . Cardiovascular stress test  09/02/2011    Post stress myocardial perfusion images show normal pattern of perfusion in all areas. No significant wall abnormalites noted.  . Transthoracic echocardiogram  09/02/2011    EF >55%, normal LV systolic function, mild diastolic dysfunction    Allergies  Allergen Reactions  . Avodart [Dutasteride] Swelling  . Ibuprofen Nausea And Vomiting  . Morphine Nausea And Vomiting    Current Outpatient Prescriptions  Medication Sig Dispense Refill  . aspirin 81 MG tablet Take 81 mg by mouth daily.    Marland Kitchen atenolol (TENORMIN) 25 MG tablet Take 12.5-25 mg by mouth daily. 1 in the morning and one-half at bedtime    . azelastine (ASTELIN) 0.1 % nasal spray Place 1 spray into both nostrils 2 (two) times daily. Use in each nostril as directed 30 mL 3  . cholecalciferol (VITAMIN D) 1000 UNITS tablet Take 1,000 Units by mouth daily.    . Cyanocobalamin (VITAMIN B-12 IJ) Inject 1,000 mcg as directed every 21 ( twenty-one) days.      Marland Kitchen docusate sodium (COLACE) 100 MG capsule Take 100 mg by  mouth 2 (two) times daily.    . ferrous gluconate (FERGON) 325 MG tablet Take 325 mg by mouth daily with breakfast.    . fluticasone (FLONASE) 50 MCG/ACT nasal spray Place 2 sprays into both nostrils daily. 16 g 6  . glimepiride (AMARYL) 4 MG tablet TAKE 1 TABLET (4 MG TOTAL) BY MOUTH DAILY. 30 tablet 5  . hydrochlorothiazide 25 MG tablet Take 12.5 mg by mouth daily.     . Insulin Glargine (TOUJEO SOLOSTAR) 300 UNIT/ML SOPN Inject 15 Units into the skin every morning. 3 pen 3  . insulin lispro (HUMALOG KWIKPEN) 100 UNIT/ML KiwkPen Inject 4-5 units three times a day (Patient taking differently: Humalog 6 at breakfast, 7 at lunch  and 5 at supper) 15 mL 3  . Insulin Pen Needle (B-D UF III MINI PEN NEEDLES) 31G X 5 MM MISC USE 4 TIMES PER DAY 125 each 3  . metFORMIN (GLUCOPHAGE) 1000 MG tablet Take 1,000 mg by mouth 2 (two) times daily with a meal. Take one tablet in am and 1/2 tablet at bedtime    . mometasone (ELOCON) 0.1 % ointment Apply topically daily. 45 g 0  . omeprazole (PRILOSEC) 20 MG capsule Take 20 mg by mouth daily.      . simvastatin (ZOCOR) 80 MG tablet Take 40 mg by mouth at bedtime.     No current facility-administered medications for this visit.    Social History   Social History  . Marital Status: Married    Spouse Name: N/A  . Number of Children: N/A  . Years of Education: N/A   Occupational History  . Not on file.   Social History Main Topics  . Smoking status: Former Research scientist (life sciences)  . Smokeless tobacco: Never Used  . Alcohol Use: No  . Drug Use: No  . Sexual Activity: Not on file     Comment: married to [name], retired.   Other Topics Concern  . Not on file   Social History Narrative   Socially, he is married and has 2 children 5 grandchildren one great-grandchild.  There is no tobacco or alcohol use.  He is retired.  Family History  Problem Relation Age of Onset  . Heart attack Mother   . Hypertension Mother     ROS General: Negative; No fevers, chills, or night sweats;  HEENT: Negative; No changes in vision or hearing, sinus congestion, difficulty swallowing Pulmonary: Negative; No cough, wheezing, shortness of breath, hemoptysis Cardiovascular: Negative; No chest pain, presyncope, syncope, palpitations GI: Negative; No nausea, vomiting, diarrhea, or abdominal pain GU: Positive for incontinence following his TURP Musculoskeletal: Negative; no myalgias, joint pain, or weakness Hematologic/Oncology: Negative; no easy bruising, bleeding Endocrine: Negative; no heat/cold intolerance; no diabetes Neuro: Negative; no changes in balance, headaches Skin: Negative; No rashes or skin  lesions Psychiatric: Negative; No behavioral problems, depression Sleep: Negative; No snoring, daytime sleepiness, hypersomnolence, bruxism, restless legs, hypnogognic hallucinations, no cataplexy Other comprehensive 14 point system review is negative.   PE BP 120/64 mmHg  Pulse 61  Ht _0  (1.753 m)  Wt 200 lb (90.719 kg)  BMI 29.52 kg/m2   Wt Readings from Last 3 Encounters:  05/21/15 200 lb (90.719 kg)  05/03/15 201 lb (91.173 kg)  01/31/15 197 lb 9.6 oz (89.631 kg)   General: Alert, oriented, no distress.  Skin: normal turgor, no rashes HEENT: Normocephalic, atraumatic. Pupils round and reactive; sclera anicteric;no lid lag. Extraocular muscles intact; no xanthelasmas. Nose without nasal septal hypertrophy Mouth/Parynx benign; Mallinpatti scale  3 Neck: No JVD, no carotid bruits; normal carotid upstroke Lungs: clear to ausculatation and percussion; no wheezing or rales Chest wall: no tenderness to palpitation Heart: RRR, s1 s2 normal; 2/6 systolic murmur concordant with his aortic valve disease.  no diastolic murmur, rub thrills or heaves Abdomen: soft, nontender; no hepatosplenomehaly, BS+; abdominal aorta nontender and not dilated by palpation. Back: no CVA tenderness Pulses 2+ Extremities: Trace ankle edema, Homan's sign negative  Neurologic: grossly nonfocal; cranial nerves grossly normal. Psychologic: normal affect and mood.  ECG (independently read by me): Normal sinus rhythm with an occasional PAC.  QTc interval 416 ms.  PR interval 178 ms.  11/27/2014 ECG (independently read by me): Normal sinus rhythm at 60 bpm.  Normal intervals.  No ectopy.  Nonspecific ST changes.  Prior ECG (independently read by me): Sinus rhythm at 60 beats per minute.  No ectopy.  Normal intervals.  LABS: BMP Latest Ref Rng 05/03/2015 01/31/2015 09/26/2014  Glucose 70 - 99 mg/dL 206(H) 129(H) 232(H)  BUN 6 - 23 mg/dL 21 29(H) 24(H)  Creatinine 0.40 - 1.50 mg/dL 1.33 1.29 1.22  Sodium 135 -  145 mEq/L 140 142 138  Potassium 3.5 - 5.1 mEq/L 4.6 3.9 4.3  Chloride 96 - 112 mEq/L 104 104 105  CO2 19 - 32 mEq/L _0 Calcium 8.4 - 10.5 mg/dL 9.2 9.4 9.2   Hepatic Function Latest Ref Rng 01/31/2015 09/26/2014 03/06/2014  Total Protein 6.0 - 8.3 g/dL 7.2 6.9 6.9  Albumin 3.5 - 5.2 g/dL 4.1 4.1 3.9  AST 0 - 37 U/L 40(H) 18 22  ALT 0 - 53 U/L _1 Alk Phosphatase 39 - 117 U/L 68 78 76  Total Bilirubin 0.2 - 1.2 mg/dL 0.6 0.6 0.6   CBC Latest Ref Rng 06/17/2012 06/10/2012 02/01/2009  WBC 4.0 - 10.5 K/uL - 8.4 8.4  Hemoglobin 13.0 - 17.0 g/dL 11.0(L) 12.8(L) 8.6(L)  Hematocrit 39.0 - 52.0 % 32.1(L) 38.0(L) 25.1(L)  Platelets 150 - 400 K/uL - 165 114(L)   No results found for: TSH  Lipid Panel     Component Value Date/Time   CHOL 130 09/26/2014 1116   TRIG 78.0 09/26/2014 1116   HDL 48.50 09/26/2014 1116   CHOLHDL 3 09/26/2014 1116   VLDL 15.6 09/26/2014 1116   LDLCALC 66 09/26/2014 1116   Lab Results  Component Value Date   HGBA1C 7.4 05/03/2015   RADIOLOGY: No results found.    ASSESSMENT AND PLAN:  [name] is an 9 -year-old Caucasian gentleman who has documented normal left ventricular size and function with mild grade 1 diastolic dysfunction. An echo Doppler study in February 2013  showed mild aortic sclerosis, which is contributing to his cardiac murmur.  He remains asymptomatic with reference to chest pain, PND, orthopnea, or significant shortness of breath, presyncope or syncope.  He has been on simvastatin 40 mg for his hyperlipidemia and recent laboratory reveals good lipid studies with an LDL of 66, normal triglycerides, cholesterol and HDL.  His diabetes recently has not been as tightly controlled as has been in the past and Dr. Dwyane Dee has been adjusting his medications.  When I last saw him, I had suggested a follow-up echo Doppler study, but he never had this done.  He admits that he is completely asymptomatic and denies chest pain or shortness of  breath.  He recently walked on a 6 mile crop walk without difficulty.  He does have PACs noted on his ECG and  continues to be on atenolol 37.5 mg in the morning with a resting pulse of 61.  He tells me he will be undergoing follow-up laboratory at the [facility] when he gets his medications renewed several months.  I will see him in one year for reevaluation, and prior to that office visit, I would suggest an echo Doppler study be obtained to follow-up his cardiac murmur    Troy Sine, MD, [facility]  05/21/2015 11:35 AM

## 2015-05-21 NOTE — Patient Instructions (Signed)
Your physician wants you to follow-up in: 1 year or sooner if needed. You will receive a reminder letter in the mail two months in advance. If you don't receive a letter, please call our office to schedule the follow-up appointment.   If you need a refill on your cardiac medications before your next appointment, please call your pharmacy.   

## 2015-05-22 ENCOUNTER — Ambulatory Visit: Payer: PRIVATE HEALTH INSURANCE | Admitting: Cardiovascular Disease

## 2015-05-24 ENCOUNTER — Other Ambulatory Visit: Payer: Self-pay | Admitting: Family Medicine

## 2015-05-24 NOTE — Telephone Encounter (Signed)
Refill appropriate and filled per protocol. 

## 2015-06-08 ENCOUNTER — Telehealth: Payer: Self-pay | Admitting: Endocrinology

## 2015-06-08 ENCOUNTER — Other Ambulatory Visit: Payer: Self-pay | Admitting: Endocrinology

## 2015-06-13 NOTE — Telephone Encounter (Signed)
error 

## 2015-07-10 ENCOUNTER — Ambulatory Visit: Payer: Medicare Other | Admitting: Podiatry

## 2015-07-10 ENCOUNTER — Ambulatory Visit (INDEPENDENT_AMBULATORY_CARE_PROVIDER_SITE_OTHER): Payer: Medicare Other | Admitting: Podiatry

## 2015-07-10 ENCOUNTER — Encounter: Payer: Self-pay | Admitting: Podiatry

## 2015-07-10 DIAGNOSIS — B351 Tinea unguium: Secondary | ICD-10-CM

## 2015-07-10 DIAGNOSIS — M79609 Pain in unspecified limb: Secondary | ICD-10-CM

## 2015-07-10 DIAGNOSIS — E1140 Type 2 diabetes mellitus with diabetic neuropathy, unspecified: Secondary | ICD-10-CM

## 2015-07-10 DIAGNOSIS — Q828 Other specified congenital malformations of skin: Secondary | ICD-10-CM | POA: Diagnosis not present

## 2015-07-10 NOTE — Progress Notes (Signed)
Patient ID: [name], male   DOB: 1928/08/01, 81 y.o.   MRN: [id number] Complaint:  Visit Type: Patient returns to my office for continued preventative foot care services. Complaint: Patient states" my nails have grown long and thick and become painful to walk and wear shoes" Patient has been diagnosed with DM with no foot complications. The patient presents for preventative foot care services. No changes to ROS.  He has history 1,2 toes left foot secondary to trauma.  Podiatric Exam: Vascular: dorsalis pedis and posterior tibial pulses are palpable bilateral. Capillary return is immediate. Temperature gradient is WNL. Skin turgor WNL  Sensorium: Normal Semmes Weinstein monofilament test. Normal tactile sensation bilaterally. Nail Exam: Pt has thick disfigured discolored nails with subungual debris noted bilateral entire nail hallux through fifth toenails Ulcer Exam: There is no evidence of ulcer or pre-ulcerative changes or infection. Orthopedic Exam: Muscle tone and strength are WNL. No limitations in general ROM. No crepitus or effusions noted. Foot type and digits show no abnormalities. Bony prominences are unremarkable. Skin:  Porokeratosis   plantar aspect left hallux. . No infection or ulcers  Diagnosis:  Onychomycosis, , Pain in right toe, pain in left toes,  Porokeratosis left foot.  Treatment & Plan Procedures and Treatment: Consent by patient was obtained for treatment procedures. The patient understood the discussion of treatment and procedures well. All questions were answered thoroughly reviewed. Debridement of mycotic and hypertrophic toenails, 1 through 5 bilateral and clearing of subungual debris. No ulceration, no infection noted.  Return Visit-Office Procedure: Patient instructed to return to the office for a follow up visit 3 months for continued evaluation and treatment.    Gardiner Barefoot DPM

## 2015-07-24 ENCOUNTER — Ambulatory Visit (INDEPENDENT_AMBULATORY_CARE_PROVIDER_SITE_OTHER): Payer: Medicare Other | Admitting: *Deleted

## 2015-07-24 DIAGNOSIS — E538 Deficiency of other specified B group vitamins: Secondary | ICD-10-CM

## 2015-07-24 MED ORDER — CYANOCOBALAMIN 1000 MCG/ML IJ SOLN
1000.0000 ug | Freq: Once | INTRAMUSCULAR | Status: AC
  Administered 2015-07-24: 1000 ug via INTRAMUSCULAR

## 2015-07-24 NOTE — Progress Notes (Signed)
Patient ID: [name], male   DOB: 1928-09-18, 81 y.o.   MRN: [id number]  Patient seen in office for Vitamin B injection.   Tolerated IM administration well.

## 2015-07-31 ENCOUNTER — Other Ambulatory Visit: Payer: PRIVATE HEALTH INSURANCE

## 2015-08-03 ENCOUNTER — Encounter: Payer: Self-pay | Admitting: Endocrinology

## 2015-08-03 ENCOUNTER — Ambulatory Visit (INDEPENDENT_AMBULATORY_CARE_PROVIDER_SITE_OTHER): Payer: Medicare Other | Admitting: Endocrinology

## 2015-08-03 VITALS — BP 130/70 | HR 66 | Temp 98.0°F | Resp 16 | Ht 69.0 in | Wt 204.8 lb

## 2015-08-03 DIAGNOSIS — E1165 Type 2 diabetes mellitus with hyperglycemia: Secondary | ICD-10-CM | POA: Diagnosis not present

## 2015-08-03 DIAGNOSIS — E785 Hyperlipidemia, unspecified: Secondary | ICD-10-CM | POA: Diagnosis not present

## 2015-08-03 LAB — LIPID PANEL
CHOL/HDL RATIO: 3
CHOLESTEROL: 151 mg/dL (ref 0–200)
HDL: 50.7 mg/dL (ref >39.00)
LDL Cholesterol: 86 mg/dL (ref 0–99)
NonHDL: 100.74
TRIGLYCERIDES: 74 mg/dL (ref 0.0–149.0)
VLDL: 14.8 mg/dL (ref 0.0–40.0)

## 2015-08-03 LAB — COMPREHENSIVE METABOLIC PANEL
ALBUMIN: 4.1 g/dL (ref 3.5–5.2)
ALT: 21 U/L (ref 0–53)
AST: 19 U/L (ref 0–37)
Alkaline Phosphatase: 82 U/L (ref 39–117)
BILIRUBIN TOTAL: 0.6 mg/dL (ref 0.2–1.2)
BUN: 24 mg/dL — ABNORMAL HIGH (ref 6–23)
CALCIUM: 9.3 mg/dL (ref 8.4–10.5)
CO2: 28 meq/L (ref 19–32)
CREATININE: 1.24 mg/dL (ref 0.40–1.50)
Chloride: 103 mEq/L (ref 96–112)
GFR: 58.99 mL/min — ABNORMAL LOW (ref >60.00)
Glucose, Bld: 263 mg/dL — ABNORMAL HIGH (ref 70–99)
Potassium: 4.2 mEq/L (ref 3.5–5.1)
Sodium: 138 mEq/L (ref 135–145)
Total Protein: 6.8 g/dL (ref 6.0–8.3)

## 2015-08-03 LAB — POCT GLYCOSYLATED HEMOGLOBIN (HGB A1C): Hemoglobin A1C: 8.3

## 2015-08-03 NOTE — Progress Notes (Signed)
Patient ID: [name], male   DOB: 1929-04-27, 82 y.o.   MRN: [id number]   Reason for Appointment: Diabetes follow-up   History of Present Illness   Diagnosis: Type 2 DIABETES MELITUS, diagnoses 1972   He has had long-standing diabetes and has been on basal bolus insulin regimen after failure of oral hypoglycemic drugs over the last several years His blood sugars have been generally fairly well controlled but has a tendency to high postprandial readings Previously required only a small dose of 5 units of Levemir insulin  Taking Amaryl in addition to his insulin regimen  his blood sugars had improved his blood sugars during the daytime  RECENT history:   Insulin regimen: Toujeo 14 units at 6 pm   Humalog 6  a.c before meals    He has taken Toujeo since 9/16 instead of twice a day Levemir However his blood sugars appear to be definitively high now A1c is over 8%, previously 7.4 . Current management, blood sugar patterns and problems identified  Fasting blood sugars are consistently high with only a couple of readings around 150 and averaging 193  This is despite his taking Toujeo very consistently in the evenings after supper  He is again checking blood sugars sporadically during the day and evening and usually no postprandial reading  His highest readings were over 200 in the afternoon last month but not clear if this was related to diet; does have a couple of good readings around suppertime also  He says he has had more stress with family events in the last few weeks  Recent readings around 7-8 PM are over 200 which she thinks are probably before supper  He has not been very active lately, may use exercise bike occasionally  Oral hypoglycemic drugs: Metformin 1500 mg , Amaryl 4 mg daily in a.m.        Side effects from medications: None          Proper timing of insulin in relation to meals: Yes.         Monitors blood glucose: 1.4 times a day.     Glucometer: One Touch.          Blood Glucose readings from meter download:   Mean values apply above for all meters except median for One Touch  PRE-MEAL Fasting Lunch Dinner Bedtime Overall  Glucose range:  140-267  ?    85-278   207    Mean/median:  193     197 +/-53    Meals: 3 meals per day. Bfst 8 am Lunch 12 noon Supper at 5-6 pm; breakfast is oatmeal with eggs.  Drinks water mostly and no sweet drinks           Physical activity: exercise: Occasional recumbent bike    Complications are: Minimal    Eye exam last 7/15, no retinopathy reported   Wt Readings from Last 3 Encounters:  08/03/15 204 lb 12.8 oz (92.897 kg)  05/21/15 200 lb (90.719 kg)  05/03/15 201 lb (91.173 kg)   Lab Results  Component Value Date   HGBA1C 8.3 08/03/2015   HGBA1C 7.4 05/03/2015   HGBA1C 7.8* 05/03/2015   Lab Results  Component Value Date   MICROALBUR 1.2 09/26/2014   LDLCALC 86 08/03/2015   CREATININE 1.24 08/03/2015       Medication List       This list is accurate as of: 08/03/15  3:23 PM.  Always use your most recent med list.  aspirin 81 MG tablet  Take 81 mg by mouth daily.     atenolol 25 MG tablet  Commonly known as:  TENORMIN  Take 12.5-25 mg by mouth daily. 1 in the morning and one-half at bedtime     azelastine 0.1 % nasal spray  Commonly known as:  ASTELIN  Place 1 spray into both nostrils 2 (two) times daily. Use in each nostril as directed     cholecalciferol 1000 units tablet  Commonly known as:  VITAMIN D  Take 1,000 Units by mouth daily.     docusate sodium 100 MG capsule  Commonly known as:  COLACE  Take 100 mg by mouth 2 (two) times daily.     ferrous gluconate 325 MG tablet  Commonly known as:  FERGON  Take 325 mg by mouth daily with breakfast.     fluticasone 50 MCG/ACT nasal spray  Commonly known as:  FLONASE  PLACE 2 SPRAYS INTO BOTH NOSTRILS DAILY.     glimepiride 4 MG tablet  Commonly known as:  AMARYL  TAKE 1 TABLET (4 MG  TOTAL) BY MOUTH DAILY.     Insulin Glargine 300 UNIT/ML Sopn  Commonly known as:  TOUJEO SOLOSTAR  Inject 15 Units into the skin every morning.     insulin lispro 100 UNIT/ML KiwkPen  Commonly known as:  HUMALOG KWIKPEN  Inject 4-5 units three times a day     Insulin Pen Needle 31G X 5 MM Misc  Commonly known as:  B-D UF III MINI PEN NEEDLES  USE 4 TIMES PER DAY     metFORMIN 1000 MG tablet  Commonly known as:  GLUCOPHAGE  Take 1,000 mg by mouth 2 (two) times daily with a meal. Take one tablet in am and 1/2 tablet at bedtime     mometasone 0.1 % ointment  Commonly known as:  ELOCON  Apply topically daily.     omeprazole 20 MG capsule  Commonly known as:  PRILOSEC  Take 20 mg by mouth daily.     simvastatin 80 MG tablet  Commonly known as:  ZOCOR  Take 40 mg by mouth at bedtime.     VITAMIN B-12 IJ  Inject 1,000 mcg as directed every 21 ( twenty-one) days.        Allergies:  Allergies  Allergen Reactions  . Avodart [Dutasteride] Swelling  . Ibuprofen Nausea And Vomiting  . Morphine Nausea And Vomiting    Past Medical History  Diagnosis Date  . Diabetes mellitus   . Colon polyps   . BPH (benign prostatic hypertrophy)   . Osteoarthritis   . History of kidney stones   . Hypertension   . Hyperlipidemia   . B12 deficiency   . GERD (gastroesophageal reflux disease)     hiatal hernia    Past Surgical History  Procedure Laterality Date  . Appendectomy    . Partial amputaion left foot    . Knee cartilage surgery      Arthroscope  . Transurethral resection of prostate  06/17/2012    Procedure: TRANSURETHRAL RESECTION OF THE PROSTATE WITH GYRUS INSTRUMENTS;  Surgeon: Molli Hazard, MD;  Location: [facility];  Service: Urology;  Laterality: N/A;  . Cystoscopy with biopsy  06/17/2012    Procedure: CYSTOSCOPY WITH BIOPSY;  Surgeon: Molli Hazard, MD;  Location: [facility];  Service: Urology;  Laterality: N/A;  . Carotid doppler  01/27/2011    Right & Left  ICAs 0-49% diameter reduction, right & left subclavian arteries demonstrated  less than 50% diameter reduction.  . Cardiovascular stress test  09/02/2011    Post stress myocardial perfusion images show normal pattern of perfusion in all areas. No significant wall abnormalites noted.  . Transthoracic echocardiogram  09/02/2011    EF >55%, normal LV systolic function, mild diastolic dysfunction    Family History  Problem Relation Age of Onset  . Heart attack Mother   . Hypertension Mother     Social History:  reports that he has quit smoking. He has never used smokeless tobacco. He reports that he does not drink alcohol or use illicit drugs.  Review of Systems:  HYPERTENSION: mild and usually well controlled, fairly consistent with checking at home  Blood pressure has been treated by his PCP  HYPERLIPIDEMIA:   Treated with high-dose simvastatin  with good control  Lab Results  Component Value Date   CHOL 151 08/03/2015   HDL 50.70 08/03/2015   LDLCALC 86 08/03/2015   TRIG 74.0 08/03/2015   CHOLHDL 3 08/03/2015        Examination:   BP 130/70 mmHg  Pulse 66  Temp(Src) 98 F (36.7 C)  Resp 16  Ht 5\' 9"  (1.753 m)  Wt 204 lb 12.8 oz (92.897 kg)  BMI 30.23 kg/m2  SpO2 97%  Body mass index is 30.23 kg/(m^2).    ASSESSMENT/ PLAN:    Diabetes type 2 requiring insulin   See history of present illness for detailed discussion of his current management, blood sugar patterns and problems identified  The patient's diabetes control is not as good with A1c going up over 8% His fasting blood sugars are consistently high Also has several high readings later in the day but usually not checking readings after meals and patterns are somewhat inconsistent in the evenings He has not been consistent with diet and also with stress may be having higher readings Has not done as much exercise as he normally does during winter months Appears to be getting more  insulin deficient    Recommendations as in patient instructions, these were reviewed in detail the patient:  Continue Toujeo but increase the dose to at least 16 units, instructed on taking 18 units if fasting readings stay over 150  Increase him allow to 8 units at breakfast and supper, continue 6 units at lunch  More consistent diet  Start using exercise bike regularly  Call if fasting blood sugars stay high  Continue metformin and Amaryl  Start monitoring blood sugars after various meals about 2 hours later    HYPERTENSION: well controlled    HYPERCHOLESTEROLEMIA: To have labs done today  Patient Instructions  Check blood sugars on waking up 3-4  times a week Also check blood sugars about 2 hours after a meal and do this after different meals by rotation  Recommended blood sugar levels on waking up is 90-130 and about 2 hours after meal is 130-160  Please bring your blood sugar monitor to each visit, thank you  TOUJEO 16 UNITS at supper and if am sugar stays > 150 go up to 18 units  HUMALOG 8 at breakfast and supper and 6 at lunch      Counseling time on subjects discussed above is over 50% of today's 25 minute visit  Bani Gianfrancesco 08/03/2015, 3:23 PM

## 2015-08-03 NOTE — Patient Instructions (Signed)
Check blood sugars on waking up 3-4  times a week Also check blood sugars about 2 hours after a meal and do this after different meals by rotation  Recommended blood sugar levels on waking up is 90-130 and about 2 hours after meal is 130-160  Please bring your blood sugar monitor to each visit, thank you  TOUJEO 16 UNITS at supper and if am sugar stays > 150 go up to 18 units  HUMALOG 8 at breakfast and supper and 6 at lunch

## 2015-08-14 ENCOUNTER — Other Ambulatory Visit: Payer: Self-pay | Admitting: Endocrinology

## 2015-08-15 ENCOUNTER — Other Ambulatory Visit: Payer: Self-pay | Admitting: Endocrinology

## 2015-09-13 ENCOUNTER — Ambulatory Visit (INDEPENDENT_AMBULATORY_CARE_PROVIDER_SITE_OTHER): Payer: Medicare Other | Admitting: Endocrinology

## 2015-09-13 ENCOUNTER — Encounter: Payer: Self-pay | Admitting: Endocrinology

## 2015-09-13 VITALS — BP 118/76 | HR 65 | Temp 97.6°F | Resp 14 | Ht 70.0 in | Wt 201.4 lb

## 2015-09-13 DIAGNOSIS — Z794 Long term (current) use of insulin: Secondary | ICD-10-CM | POA: Diagnosis not present

## 2015-09-13 DIAGNOSIS — E1165 Type 2 diabetes mellitus with hyperglycemia: Secondary | ICD-10-CM | POA: Diagnosis not present

## 2015-09-13 LAB — BASIC METABOLIC PANEL
BUN: 22 mg/dL (ref 6–23)
CO2: 29 meq/L (ref 19–32)
CREATININE: 1.27 mg/dL (ref 0.40–1.50)
Calcium: 9.7 mg/dL (ref 8.4–10.5)
Chloride: 103 mEq/L (ref 96–112)
GFR: 57.37 mL/min — ABNORMAL LOW (ref >60.00)
GLUCOSE: 201 mg/dL — AB (ref 70–99)
Potassium: 4.4 mEq/L (ref 3.5–5.1)
Sodium: 139 mEq/L (ref 135–145)

## 2015-09-13 NOTE — Patient Instructions (Signed)
Humalog 8 at every meal  May take 10 units for larger meals  Take Toujeo 18 units in pm daily  Check blood sugars on waking up 3-4  times a week Also check blood sugars about 2 hours after a meal and do this after different meals by rotation  Recommended blood sugar levels on waking up is 90-130 and about 2 hours after meal is 130-160  Please bring your blood sugar monitor to each visit, thank you

## 2015-09-13 NOTE — Progress Notes (Signed)
Patient ID: [name], male   DOB: [birth year], 82 y.o.   MRN: [id number]   Reason for Appointment: Diabetes follow-up   History of Present Illness   Diagnosis: Type 2 DIABETES MELITUS, diagnoses 1972   He has had long-standing diabetes and has been on basal bolus insulin regimen after failure of oral hypoglycemic drugs over the last several years His blood sugars have been generally fairly well controlled but has a tendency to high postprandial readings Previously required only a small dose of 5 units of Levemir insulin  Taking Amaryl in addition to his insulin regimen  his blood sugars had improved his blood sugars during the daytime  RECENT history:   Insulin regimen: Toujeo 18 units at 6 pm   Humalog 8 a.c before breakfast and supper and 6 units before lunch   He has taken Toujeo since 9/16 instead of twice a day Levemir A1c is over 8% as of 1/17, previously 7.4 Even though his doses were increased on his last visit his blood sugars still did not appear improved  Current management, blood sugar patterns and problems identified  Fasting blood sugars are consistently high and overall about the same as last time  However he has had asymptomatic glucose of 70 on 1 morning and he does not know why  The last 2 readings are relatively better in the morning also  Again he is not doing POSTPRANDIAL readings as directed except occasionally: None recently  Blood sugar in the office is 189 after breakfast, previously 220  Highest glucose was 327 before supper last month  Has mostly high readings in the afternoons and evenings but has only 4 readings to look at  He may use exercise bike in the evenings and he thinks he is doing this more often now  Oral hypoglycemic drugs: Metformin 1500 mg , Amaryl 4 mg daily in a.m.        Side effects from medications: None          Proper timing of insulin in relation to meals: Yes.         Monitors blood glucose: 1 times a day.     Glucometer: One Touch.          Blood Glucose readings from meter download:   Mean values apply above for all meters except median for One Touch  PRE-MEAL Fasting Lunch Dinner Bedtime Overall  Glucose range:  70-236    253-327   193, 280    Mean/median:  194      209     Meals: 3 meals per day. Bfst 8 am Lunch 12 noon Supper at 5-6 pm; breakfast is oatmeal with eggs.  Drinks water mostly and no sweet drinks           Physical activity: exercise: Recently doing recumbent bike      Wt Readings from Last 3 Encounters:  09/13/15 201 lb 6.4 oz (91.354 kg)  08/03/15 204 lb 12.8 oz (92.897 kg)  05/21/15 200 lb (90.719 kg)   Lab Results  Component Value Date   HGBA1C 8.3 08/03/2015   HGBA1C 7.4 05/03/2015   HGBA1C 7.8* 05/03/2015   Lab Results  Component Value Date   MICROALBUR 1.2 09/26/2014   LDLCALC 86 08/03/2015   CREATININE 1.24 08/03/2015       Medication List       This list is accurate as of: 09/13/15 12:36 PM.  Always use your most recent med list.  aspirin 81 MG tablet  Take 81 mg by mouth daily.     atenolol 25 MG tablet  Commonly known as:  TENORMIN  Take 12.5-25 mg by mouth daily. 1 in the morning and one-half at bedtime     azelastine 0.1 % nasal spray  Commonly known as:  ASTELIN  Place 1 spray into both nostrils 2 (two) times daily. Use in each nostril as directed     cholecalciferol 1000 units tablet  Commonly known as:  VITAMIN D  Take 1,000 Units by mouth daily.     docusate sodium 100 MG capsule  Commonly known as:  COLACE  Take 100 mg by mouth 2 (two) times daily.     ferrous gluconate 325 MG tablet  Commonly known as:  FERGON  Take 325 mg by mouth daily with breakfast.     fluticasone 50 MCG/ACT nasal spray  Commonly known as:  FLONASE  PLACE 2 SPRAYS INTO BOTH NOSTRILS DAILY.     glimepiride 4 MG tablet  Commonly known as:  AMARYL  TAKE 1 TABLET (4 MG TOTAL) BY MOUTH DAILY.     HUMALOG KWIKPEN 100 UNIT/ML  KiwkPen  Generic drug:  insulin lispro  INJECT 4-5 UNITS THREE TIMES A DAY     Insulin Glargine 300 UNIT/ML Sopn  Commonly known as:  TOUJEO SOLOSTAR  Inject 15 Units into the skin every morning.     Insulin Pen Needle 31G X 5 MM Misc  Commonly known as:  B-D UF III MINI PEN NEEDLES  USE 4 TIMES PER DAY     B-D UF III MINI PEN NEEDLES 31G X 5 MM Misc  Generic drug:  Insulin Pen Needle  USE 4 TIMES PER DAY     metFORMIN 1000 MG tablet  Commonly known as:  GLUCOPHAGE  Take 1,000 mg by mouth 2 (two) times daily with a meal. Take one tablet in am and 1/2 tablet at bedtime     mometasone 0.1 % ointment  Commonly known as:  ELOCON  Apply topically daily.     omeprazole 20 MG capsule  Commonly known as:  PRILOSEC  Take 20 mg by mouth daily.     simvastatin 80 MG tablet  Commonly known as:  ZOCOR  Take 40 mg by mouth at bedtime.     VITAMIN B-12 IJ  Inject 1,000 mcg as directed every 21 ( twenty-one) days.        Allergies:  Allergies  Allergen Reactions  . Avodart [Dutasteride] Swelling  . Ibuprofen Nausea And Vomiting  . Morphine Nausea And Vomiting    Past Medical History  Diagnosis Date  . Diabetes mellitus   . Colon polyps   . BPH (benign prostatic hypertrophy)   . Osteoarthritis   . History of kidney stones   . Hypertension   . Hyperlipidemia   . B12 deficiency   . GERD (gastroesophageal reflux disease)     hiatal hernia    Past Surgical History  Procedure Laterality Date  . Appendectomy    . Partial amputaion left foot    . Knee cartilage surgery      Arthroscope  . Transurethral resection of prostate  06/17/2012    Procedure: TRANSURETHRAL RESECTION OF THE PROSTATE WITH GYRUS INSTRUMENTS;  Surgeon: Molli Hazard, MD;  Location: [facility];  Service: Urology;  Laterality: N/A;  . Cystoscopy with biopsy  06/17/2012    Procedure: CYSTOSCOPY WITH BIOPSY;  Surgeon: Molli Hazard, MD;  Location: [facility];  Service:  Urology;  Laterality: N/A;    . Carotid doppler  01/27/2011    Right & Left ICAs 0-49% diameter reduction, right & left subclavian arteries demonstrated less than 50% diameter reduction.  . Cardiovascular stress test  09/02/2011    Post stress myocardial perfusion images show normal pattern of perfusion in all areas. No significant wall abnormalites noted.  . Transthoracic echocardiogram  09/02/2011    EF >55%, normal LV systolic function, mild diastolic dysfunction    Family History  Problem Relation Age of Onset  . Heart attack Mother   . Hypertension Mother     Social History:  reports that he has quit smoking. He has never used smokeless tobacco. He reports that he does not drink alcohol or use illicit drugs.  Review of Systems:  HYPERTENSION: mild and  well controlled, fairly consistent with checking at home  This has been followed by PCP  HYPERLIPIDEMIA:   Treated with high-dose simvastatin  with good control  Lab Results  Component Value Date   CHOL 151 08/03/2015   HDL 50.70 08/03/2015   LDLCALC 86 08/03/2015   TRIG 74.0 08/03/2015   CHOLHDL 3 08/03/2015        Examination:   BP 118/76 mmHg  Pulse 65  Temp(Src) 97.6 F (36.4 C)  Resp 14  Ht 5\' 10"  (1.778 m)  Wt 201 lb 6.4 oz (91.354 kg)  BMI 28.90 kg/m2  SpO2 98%  Body mass index is 28.9 kg/(m^2).    ASSESSMENT/ PLAN:    Diabetes type 2 requiring insulin   See history of present illness for detailed discussion of his current management, blood sugar patterns and problems identified  He has been progressively more insulin deficient and taking basal bolus insulin regimen with inadequate control His blood sugars are variable in the mornings and may be related to very ability and readings after supper which he is not monitoring much He does not adjust his mealtime doses based on what he is planning to eat Also not clear why he had a low reading of 70 once waking up but has some readings over 200 also over the last month He does exercise in  the evenings and this is probably helping his sugars in the last few days   Recommendations as in patient instructions, these were reviewed in detail the patient:  No change in Toujeo as yet  Increase lunchtime dose to 8 units of Humalog  May need to take 10 units of Humalog at larger meals  Call if  blood sugars stay high  Continue metformin and Amaryl  Start monitoring blood sugars after various meals about 2 hours later    Patient Instructions  Humalog 8 at every meal  May take 10 units for larger meals  Take Toujeo 18 units in pm daily  Check blood sugars on waking up 3-4  times a week Also check blood sugars about 2 hours after a meal and do this after different meals by rotation  Recommended blood sugar levels on waking up is 90-130 and about 2 hours after meal is 130-160  Please bring your blood sugar monitor to each visit, thank you       [facility] 09/13/2015, 12:36 PM

## 2015-09-14 LAB — FRUCTOSAMINE: Fructosamine: 393 umol/L — ABNORMAL HIGH (ref 0–285)

## 2015-09-19 DIAGNOSIS — Z85828 Personal history of other malignant neoplasm of skin: Secondary | ICD-10-CM | POA: Diagnosis not present

## 2015-09-19 DIAGNOSIS — L821 Other seborrheic keratosis: Secondary | ICD-10-CM | POA: Diagnosis not present

## 2015-09-19 DIAGNOSIS — D1801 Hemangioma of skin and subcutaneous tissue: Secondary | ICD-10-CM | POA: Diagnosis not present

## 2015-09-19 DIAGNOSIS — L570 Actinic keratosis: Secondary | ICD-10-CM | POA: Diagnosis not present

## 2015-09-19 DIAGNOSIS — D225 Melanocytic nevi of trunk: Secondary | ICD-10-CM | POA: Diagnosis not present

## 2015-09-19 DIAGNOSIS — L812 Freckles: Secondary | ICD-10-CM | POA: Diagnosis not present

## 2015-09-27 ENCOUNTER — Ambulatory Visit (INDEPENDENT_AMBULATORY_CARE_PROVIDER_SITE_OTHER): Payer: Medicare Other | Admitting: *Deleted

## 2015-09-27 DIAGNOSIS — E538 Deficiency of other specified B group vitamins: Secondary | ICD-10-CM | POA: Diagnosis not present

## 2015-09-27 MED ORDER — CYANOCOBALAMIN 1000 MCG/ML IJ SOLN
1000.0000 ug | Freq: Once | INTRAMUSCULAR | Status: AC
  Administered 2015-09-27: 1000 ug via INTRAMUSCULAR

## 2015-09-27 NOTE — Progress Notes (Signed)
Patient ID: [name], male   DOB: 1929-05-13, 82 y.o.   MRN: [id number]  Patient seen in office for Vitamin B 12 injection.   Tolerated IM administration well.

## 2015-10-13 DIAGNOSIS — R079 Chest pain, unspecified: Secondary | ICD-10-CM | POA: Diagnosis not present

## 2015-10-16 ENCOUNTER — Ambulatory Visit (INDEPENDENT_AMBULATORY_CARE_PROVIDER_SITE_OTHER): Payer: Medicare Other | Admitting: Podiatry

## 2015-10-16 DIAGNOSIS — M79673 Pain in unspecified foot: Secondary | ICD-10-CM | POA: Diagnosis not present

## 2015-10-16 DIAGNOSIS — E1140 Type 2 diabetes mellitus with diabetic neuropathy, unspecified: Secondary | ICD-10-CM

## 2015-10-16 DIAGNOSIS — B351 Tinea unguium: Secondary | ICD-10-CM

## 2015-10-16 DIAGNOSIS — M79609 Pain in unspecified limb: Secondary | ICD-10-CM

## 2015-10-16 DIAGNOSIS — Q828 Other specified congenital malformations of skin: Secondary | ICD-10-CM | POA: Diagnosis not present

## 2015-10-16 NOTE — Progress Notes (Signed)
Patient ID: [name], male   DOB: 1928/08/01, 82 y.o.   MRN: [id number] Complaint:  Visit Type: Patient returns to my office for continued preventative foot care services. Complaint: Patient states" my nails have grown long and thick and become painful to walk and wear shoes" Patient has been diagnosed with DM with no foot complications. The patient presents for preventative foot care services. No changes to ROS.  He has history 1,2 toes left foot secondary to trauma.  Podiatric Exam: Vascular: dorsalis pedis and posterior tibial pulses are palpable bilateral. Capillary return is immediate. Temperature gradient is WNL. Skin turgor WNL  Sensorium: Normal Semmes Weinstein monofilament test. Normal tactile sensation bilaterally. Nail Exam: Pt has thick disfigured discolored nails with subungual debris noted bilateral entire nail hallux through fifth toenails Ulcer Exam: There is no evidence of ulcer or pre-ulcerative changes or infection. Orthopedic Exam: Muscle tone and strength are WNL. No limitations in general ROM. No crepitus or effusions noted. Foot type and digits show no abnormalities. Bony prominences are unremarkable. Skin:  Porokeratosis   plantar aspect left hallux. . No infection or ulcers  Diagnosis:  Onychomycosis, , Pain in right toe, pain in left toes,  Porokeratosis left foot.  Treatment & Plan Procedures and Treatment: Consent by patient was obtained for treatment procedures. The patient understood the discussion of treatment and procedures well. All questions were answered thoroughly reviewed. Debridement of mycotic and hypertrophic toenails, 1 through 5 bilateral and clearing of subungual debris. No ulceration, no infection noted.  Return Visit-Office Procedure: Patient instructed to return to the office for a follow up visit 3 months for continued evaluation and treatment.    Gardiner Barefoot DPM

## 2015-10-22 ENCOUNTER — Ambulatory Visit (INDEPENDENT_AMBULATORY_CARE_PROVIDER_SITE_OTHER): Payer: Medicare Other | Admitting: Family Medicine

## 2015-10-22 ENCOUNTER — Encounter: Payer: Self-pay | Admitting: Family Medicine

## 2015-10-22 VITALS — BP 120/68 | HR 68 | Temp 98.3°F | Resp 16 | Ht 69.0 in | Wt 198.0 lb

## 2015-10-22 DIAGNOSIS — J019 Acute sinusitis, unspecified: Secondary | ICD-10-CM | POA: Diagnosis not present

## 2015-10-22 DIAGNOSIS — L304 Erythema intertrigo: Secondary | ICD-10-CM

## 2015-10-22 MED ORDER — AMOXICILLIN 875 MG PO TABS: 875.0000 mg | ORAL_TABLET | Freq: Two times a day (BID) | ORAL | Status: DC

## 2015-10-22 MED ORDER — CLOTRIMAZOLE-BETAMETHASONE 1-0.05 % EX CREA: 1.0000 "application " | TOPICAL_CREAM | Freq: Two times a day (BID) | CUTANEOUS | Status: DC

## 2015-10-22 NOTE — Progress Notes (Signed)
Subjective:    Patient ID: [name], male    DOB: 05-11-1929, 82 y.o.   MRN: [id number]  HPI Patient presents with 2 weeks of rhinorrhea, head congestion, purulent nasal drainage, dull headache, and tenderness in his left maxillary sinus. He also has postnasal drip causing a nonproductive cough. He also has a rash on his left gluteus. In the center of the rash is a cold draining what appears to be sebaceous cyst. It is approximately 1 cm in diameter with a small opening and mild purplish discoloration to the skin. However due to incontinence and more history appears to developed a secondary erythematous rash with serpiginous border around that it appears to be intertrigo Past Medical History  Diagnosis Date  . Diabetes mellitus   . Colon polyps   . BPH (benign prostatic hypertrophy)   . Osteoarthritis   . History of kidney stones   . Hypertension   . Hyperlipidemia   . B12 deficiency   . GERD (gastroesophageal reflux disease)     hiatal hernia   Past Surgical History  Procedure Laterality Date  . Appendectomy    . Partial amputaion left foot    . Knee cartilage surgery      Arthroscope  . Transurethral resection of prostate  06/17/2012    Procedure: TRANSURETHRAL RESECTION OF THE PROSTATE WITH GYRUS INSTRUMENTS;  Surgeon: Molli Hazard, MD;  Location: [facility];  Service: Urology;  Laterality: N/A;  . Cystoscopy with biopsy  06/17/2012    Procedure: CYSTOSCOPY WITH BIOPSY;  Surgeon: Molli Hazard, MD;  Location: [facility];  Service: Urology;  Laterality: N/A;  . Carotid doppler  01/27/2011    Right & Left ICAs 0-49% diameter reduction, right & left subclavian arteries demonstrated less than 50% diameter reduction.  . Cardiovascular stress test  09/02/2011    Post stress myocardial perfusion images show normal pattern of perfusion in all areas. No significant wall abnormalites noted.  . Transthoracic echocardiogram  09/02/2011    EF >55%, normal LV systolic function,  mild diastolic dysfunction   Current Outpatient Prescriptions on File Prior to Visit  Medication Sig Dispense Refill  . aspirin 81 MG tablet Take 81 mg by mouth daily.    Marland Kitchen atenolol (TENORMIN) 25 MG tablet Take 12.5-25 mg by mouth daily. 1 in the morning and one-half at bedtime    . azelastine (ASTELIN) 0.1 % nasal spray Place 1 spray into both nostrils 2 (two) times daily. Use in each nostril as directed 30 mL 3  . B-D UF III MINI PEN NEEDLES 31G X 5 MM MISC USE 4 TIMES PER DAY 130 each 3  . cholecalciferol (VITAMIN D) 1000 UNITS tablet Take 1,000 Units by mouth daily.    . Cyanocobalamin (VITAMIN B-12 IJ) Inject 1,000 mcg as directed every 21 ( twenty-one) days.      Marland Kitchen docusate sodium (COLACE) 100 MG capsule Take 100 mg by mouth 2 (two) times daily.    . ferrous gluconate (FERGON) 325 MG tablet Take 325 mg by mouth daily with breakfast.    . fluticasone (FLONASE) 50 MCG/ACT nasal spray PLACE 2 SPRAYS INTO BOTH NOSTRILS DAILY. 16 g 3  . glimepiride (AMARYL) 4 MG tablet TAKE 1 TABLET (4 MG TOTAL) BY MOUTH DAILY. 30 tablet 5  . HUMALOG KWIKPEN 100 UNIT/ML KiwkPen INJECT 4-5 UNITS THREE TIMES A DAY 15 mL 3  . Insulin Glargine (TOUJEO SOLOSTAR) 300 UNIT/ML SOPN Inject 15 Units into the skin every morning. 3 pen 3  .  Insulin Pen Needle (B-D UF III MINI PEN NEEDLES) 31G X 5 MM MISC USE 4 TIMES PER DAY 125 each 3  . metFORMIN (GLUCOPHAGE) 1000 MG tablet Take 1,000 mg by mouth 2 (two) times daily with a meal. Take one tablet in am and 1/2 tablet at bedtime    . mometasone (ELOCON) 0.1 % ointment Apply topically daily. 45 g 0  . omeprazole (PRILOSEC) 20 MG capsule Take 20 mg by mouth daily.      . simvastatin (ZOCOR) 80 MG tablet Take 40 mg by mouth at bedtime.     No current facility-administered medications on file prior to visit.   Allergies  Allergen Reactions  . Avodart [Dutasteride] Swelling  . Ibuprofen Nausea And Vomiting  . Morphine Nausea And Vomiting   Social History   Social  History  . Marital Status: Married    Spouse Name: N/A  . Number of Children: N/A  . Years of Education: N/A   Occupational History  . Not on file.   Social History Main Topics  . Smoking status: Former Research scientist (life sciences)  . Smokeless tobacco: Never Used  . Alcohol Use: No  . Drug Use: No  . Sexual Activity: Not on file     Comment: married to [place], retired.   Other Topics Concern  . Not on file   Social History Narrative      Review of Systems  All other systems reviewed and are negative.      Objective:   Physical Exam  HENT:  Right Ear: Tympanic membrane, external ear and ear canal normal.  Left Ear: Tympanic membrane, external ear and ear canal normal.  Nose: Mucosal edema and rhinorrhea present. Left sinus exhibits maxillary sinus tenderness.  Mouth/Throat: Oropharynx is clear and moist. No oropharyngeal exudate.  Eyes: Conjunctivae are normal.  Neck: Neck supple.  Cardiovascular: Normal rate, regular rhythm and normal heart sounds.   Pulmonary/Chest: Effort normal and breath sounds normal. No respiratory distress. He has no wheezes. He has no rales.  Lymphadenopathy:    He has no cervical adenopathy.  Skin: Rash noted.  Vitals reviewed.         Assessment & Plan:  Acute rhinosinusitis - Plan: amoxicillin (AMOXIL) 875 MG tablet  Intertrigo - Plan: clotrimazole-betamethasone (LOTRISONE) cream  Patient has a sinus infection. Use amoxicillin 875 mg by mouth twice a day for 10 days. Continue to use Flonase. He also appears to develop intertrigo around an old sebaceous cyst on his left gluteus. Use Lotrisone cream twice daily for 10 days. If he desires we can try to excise the cyst sac from the old sebaceous cyst in the future if it does not completely heal

## 2015-10-26 ENCOUNTER — Ambulatory Visit (INDEPENDENT_AMBULATORY_CARE_PROVIDER_SITE_OTHER): Payer: Medicare Other | Admitting: Endocrinology

## 2015-10-26 ENCOUNTER — Encounter: Payer: Self-pay | Admitting: Endocrinology

## 2015-10-26 VITALS — BP 132/78 | HR 74 | Temp 97.6°F | Resp 14 | Ht 69.0 in | Wt 198.8 lb

## 2015-10-26 DIAGNOSIS — Z794 Long term (current) use of insulin: Secondary | ICD-10-CM | POA: Diagnosis not present

## 2015-10-26 DIAGNOSIS — E1165 Type 2 diabetes mellitus with hyperglycemia: Secondary | ICD-10-CM

## 2015-10-26 NOTE — Progress Notes (Addendum)
Patient ID: [name], male   DOB: 15-Jun-1929, 82 y.o.   MRN: [id number]   Reason for Appointment: Diabetes follow-up   History of Present Illness   Diagnosis: Type 2 DIABETES MELITUS, diagnoses 1972   He has had long-standing diabetes and has been on basal bolus insulin regimen after failure of oral hypoglycemic drugs over the last several years His blood sugars have been generally fairly well controlled but has a tendency to high postprandial readings Previously required only a small dose of 5 units of Levemir insulin  Taking Amaryl in addition to his insulin regimen  his blood sugars had improved his blood sugars during the daytime  RECENT history:   Insulin regimen: Toujeo 18 units at 6 pm   Humalog 7-8 a.c     He has taken Toujeo since 9/16 instead of twice a day Levemir A1c is over 8% as of 1/17, previously 7.4  His blood sugars appear to be significantly high but he has not brought his monitor for review Also appears to be inconsistent with his answers and unclear if he is following instructions  Current management, blood sugar patterns and problems identified  He thinks his Fasting blood sugars are around 200 but gives variable answers and he thinks that sometimes he checks his sugar 30 minutes after eating  He does not know why his sugar is over 400 today in the office  He does think his blood sugars are occasionally over 300 after meals  Appears that he may sometimes not take his Humalog before each meal and his wife is not watching him do this  He has had some increased thirst with high sugars but is staying with sugar free drinks  He was supposed to take 10 units of Humalog at least in the morning but is still taking 7-8 units  He has switched his Toujeo to the morning on his own but did not take it this morning, he does not know why  Has not had any steroids lately  Oral hypoglycemic drugs: Metformin 1500 mg , Amaryl 4 mg daily in a.m.         Side effects from medications: None          Proper timing of insulin in relation to meals: Yes.         Monitors blood glucose: 1 times a day.    Glucometer: One Touch.          Blood Glucose readings from recall as above, did not bring monitor today  : Meals: 3 meals per day. Bfst 8 am Lunch 12 noon Supper at 5-6 pm; breakfast is oatmeal with eggs.  Drinks water mostly and no sweet drinks           Physical activity: exercise: Sometimes doing recumbent bike      Wt Readings from Last 3 Encounters:  10/26/15 198 lb 12.8 oz (90.175 kg)  10/22/15 198 lb (89.812 kg)  09/13/15 201 lb 6.4 oz (91.354 kg)   Lab Results  Component Value Date   HGBA1C 8.3 08/03/2015   HGBA1C 7.4 05/03/2015   HGBA1C 7.8* 05/03/2015   Lab Results  Component Value Date   MICROALBUR 1.2 09/26/2014   LDLCALC 86 08/03/2015   CREATININE 1.27 09/13/2015       Medication List       This list is accurate as of: 10/26/15  1:02 PM.  Always use your most recent med list.  amoxicillin 875 MG tablet  Commonly known as:  AMOXIL  Take 1 tablet (875 mg total) by mouth 2 (two) times daily.     aspirin 81 MG tablet  Take 81 mg by mouth daily.     atenolol 25 MG tablet  Commonly known as:  TENORMIN  Take 12.5-25 mg by mouth daily. 1 in the morning and one-half at bedtime     azelastine 0.1 % nasal spray  Commonly known as:  ASTELIN  Place 1 spray into both nostrils 2 (two) times daily. Use in each nostril as directed     cholecalciferol 1000 units tablet  Commonly known as:  VITAMIN D  Take 1,000 Units by mouth daily.     clotrimazole-betamethasone cream  Commonly known as:  LOTRISONE  Apply 1 application topically 2 (two) times daily.     docusate sodium 100 MG capsule  Commonly known as:  COLACE  Take 100 mg by mouth 2 (two) times daily.     ferrous gluconate 325 MG tablet  Commonly known as:  FERGON  Take 325 mg by mouth daily with breakfast.     fluticasone 50 MCG/ACT nasal  spray  Commonly known as:  FLONASE  PLACE 2 SPRAYS INTO BOTH NOSTRILS DAILY.     glimepiride 4 MG tablet  Commonly known as:  AMARYL  TAKE 1 TABLET (4 MG TOTAL) BY MOUTH DAILY.     HUMALOG KWIKPEN 100 UNIT/ML KiwkPen  Generic drug:  insulin lispro  INJECT 4-5 UNITS THREE TIMES A DAY     Insulin Glargine 300 UNIT/ML Sopn  Commonly known as:  TOUJEO SOLOSTAR  Inject 15 Units into the skin every morning.     Insulin Pen Needle 31G X 5 MM Misc  Commonly known as:  B-D UF III MINI PEN NEEDLES  USE 4 TIMES PER DAY     B-D UF III MINI PEN NEEDLES 31G X 5 MM Misc  Generic drug:  Insulin Pen Needle  USE 4 TIMES PER DAY     metFORMIN 1000 MG tablet  Commonly known as:  GLUCOPHAGE  Take 1,000 mg by mouth 2 (two) times daily with a meal. Take one tablet in am and 1/2 tablet at bedtime     mometasone 0.1 % ointment  Commonly known as:  ELOCON  Apply topically daily.     omeprazole 20 MG capsule  Commonly known as:  PRILOSEC  Take 20 mg by mouth daily.     simvastatin 80 MG tablet  Commonly known as:  ZOCOR  Take 40 mg by mouth at bedtime.     VITAMIN B-12 IJ  Inject 1,000 mcg as directed every 21 ( twenty-one) days.        Allergies:  Allergies  Allergen Reactions  . Avodart [Dutasteride] Swelling  . Ibuprofen Nausea And Vomiting  . Morphine Nausea And Vomiting    Past Medical History  Diagnosis Date  . Diabetes mellitus   . Colon polyps   . BPH (benign prostatic hypertrophy)   . Osteoarthritis   . History of kidney stones   . Hypertension   . Hyperlipidemia   . B12 deficiency   . GERD (gastroesophageal reflux disease)     hiatal hernia    Past Surgical History  Procedure Laterality Date  . Appendectomy    . Partial amputaion left foot    . Knee cartilage surgery      Arthroscope  . Transurethral resection of prostate  06/17/2012    Procedure: TRANSURETHRAL RESECTION  OF THE PROSTATE WITH GYRUS INSTRUMENTS;  Surgeon: Molli Hazard, MD;  Location:  [facility];  Service: Urology;  Laterality: N/A;  . Cystoscopy with biopsy  06/17/2012    Procedure: CYSTOSCOPY WITH BIOPSY;  Surgeon: Molli Hazard, MD;  Location: [facility];  Service: Urology;  Laterality: N/A;  . Carotid doppler  01/27/2011    Right & Left ICAs 0-49% diameter reduction, right & left subclavian arteries demonstrated less than 50% diameter reduction.  . Cardiovascular stress test  09/02/2011    Post stress myocardial perfusion images show normal pattern of perfusion in all areas. No significant wall abnormalites noted.  . Transthoracic echocardiogram  09/02/2011    EF >55%, normal LV systolic function, mild diastolic dysfunction    Family History  Problem Relation Age of Onset  . Heart attack Mother   . Hypertension Mother     Social History:  reports that he has quit smoking. He has never used smokeless tobacco. He reports that he does not drink alcohol or use illicit drugs.  Review of Systems:  HYPERTENSION: mild and  well controlled, fairly consistent with checking at home  This has been followed by PCP  HYPERLIPIDEMIA:   Treated with high-dose simvastatin  with good control  Lab Results  Component Value Date   CHOL 151 08/03/2015   HDL 50.70 08/03/2015   LDLCALC 86 08/03/2015   TRIG 74.0 08/03/2015   CHOLHDL 3 08/03/2015   Recently on amoxicillin for sinusitis      Examination:   BP 132/78 mmHg  Pulse 74  Temp(Src) 97.6 F (36.4 C)  Resp 14  Ht 5\' 9"  (1.753 m)  Wt 198 lb 12.8 oz (90.175 kg)  BMI 29.34 kg/m2  SpO2 96%  Body mass index is 29.34 kg/(m^2).    ASSESSMENT/ PLAN:    Diabetes type 2 requiring insulin   See history of present illness for detailed discussion of his current management, blood sugar patterns and problems identified  He appears not to be compliant with his insulin and not consistent with his glucose monitoring also Without his glucose monitor difficult to assess his patterns but most likely his sugars are significantly high,  over 400 in the office today for no apparent reason Most likely is not taking his Toujeo everyday Also admits to not taking his Humalog with every meal and also taking lower than the prescribed doses Most likely his diet is not playing a role in his high sugars   Recommendations as in patient instructions, these were reviewed in detail the patient:  Increase Toujeo to 22 units and take at suppertime daily  Increase Humalog by at least 2 units at lunch and dinner and 4 units at breakfast  Consistent glucose monitoring and bring monitor on each visit  Follow-up with diabetes educator next week  Given him diary to keep a record of his insulin doses  Follow-up in 3 weeks  Will recommend that he have evaluation for dementia with his PCP    Patient Instructions  Toujeo 22 units at dinner daily   Humalog 12 units BEFORE BREAKFAST, 10 BEFORE LUNCH AND 10 SUPPER  Check blood sugars on waking up at least every 2 days  Check at least 1 reading before lunch or supper in a day  Also check blood sugars about 2 hours after dinner every 2 days   Recommended blood sugar levels on waking up is 90-130 and about 2 hours after meal is 130-160  Please bring your blood sugar monitor to  each visit, thank you     Counseling time on subjects discussed above is over 50% of today's 25 minute visit   Rashema Seawright 10/26/2015, 1:02 PM

## 2015-10-26 NOTE — Patient Instructions (Addendum)
Toujeo 22 units at dinner daily   Humalog 12 units BEFORE BREAKFAST, 10 BEFORE LUNCH AND 10 SUPPER  Check blood sugars on waking up at least every 2 days  Check at least 1 reading before lunch or supper in a day  Also check blood sugars about 2 hours after dinner every 2 days   Recommended blood sugar levels on waking up is 90-130 and about 2 hours after meal is 130-160  Please bring your blood sugar monitor to each visit, thank you

## 2015-10-31 ENCOUNTER — Encounter: Payer: Medicare Other | Attending: Endocrinology | Admitting: Nutrition

## 2015-10-31 DIAGNOSIS — Z029 Encounter for administrative examinations, unspecified: Secondary | ICD-10-CM | POA: Insufficient documentation

## 2015-10-31 DIAGNOSIS — Z794 Long term (current) use of insulin: Secondary | ICD-10-CM

## 2015-10-31 DIAGNOSIS — IMO0001 Reserved for inherently not codable concepts without codable children: Secondary | ICD-10-CM

## 2015-10-31 DIAGNOSIS — E1165 Type 2 diabetes mellitus with hyperglycemia: Secondary | ICD-10-CM

## 2015-10-31 NOTE — Progress Notes (Signed)
[id number] is here with wife.  Says he is taking Toujeo 20u qD and Humalog 10u ac all meals.  Says the dose is on there refrigerator and he thinks that is what he is taking.   Wife wrote down all of his meals for the last 7 days.  They are balanced and bfast is 90 carbs, other meals are around 45-60 grams.   Meter download for last 6 days: Date        acB          acL                             acS            HS 10/27/15     219           331           4/2          127          449 (2hrpcL)  269    166 4/3/         215  4/4          157  Plan:  Reviewed the insulin dose.  Asked him to look on his refrigerator.  He should be taking 22u of Toujeo, and 12u of Humalog acB.  Written instructions were given for this. I told him I would call him on Monday

## 2015-10-31 NOTE — Patient Instructions (Signed)
Take 22u of Toujeo before supper Take 12u of Humalog before breakfast, and 10u before lunch and supper

## 2015-11-01 ENCOUNTER — Other Ambulatory Visit: Payer: Self-pay | Admitting: *Deleted

## 2015-11-01 ENCOUNTER — Telehealth: Payer: Self-pay | Admitting: Endocrinology

## 2015-11-01 NOTE — Telephone Encounter (Signed)
Noted, patient and his wife are aware.

## 2015-11-01 NOTE — Telephone Encounter (Signed)
Please see below and advise.

## 2015-11-01 NOTE — Telephone Encounter (Signed)
PT said that yesterday pt was given a paper from [facility] telling him to switch and take the Levemir in the morning and the PT said he hasn't taken that since January 2016 and needs to know if a new Rx for the Levemir should be called in to the pharmacy.

## 2015-11-01 NOTE — Telephone Encounter (Signed)
He is supposed to take to Toujeo at dinnertime, no Levemir

## 2015-11-05 ENCOUNTER — Telehealth: Payer: Self-pay | Admitting: Nutrition

## 2015-11-05 NOTE — Telephone Encounter (Signed)
Phone call to patient to confirm he is taking the correct insulin doses/ and the right insulin. He reports that he is taking Toujeo 22u qd, and Humalog 12u acB, 10u acL and 10u acS.   He reports that he has had 2 low blood sugars where he got a little shakey and weak.  They were both acS, and the reading said 89mg .   Explained that 71 is not low, and that he was told to call me if he has another "low" this week.

## 2015-11-05 NOTE — Telephone Encounter (Signed)
[name]: Please follow-up on my questions and instructions

## 2015-11-05 NOTE — Telephone Encounter (Signed)
I called and spoke with his wife, patient is not at the house right now.  I gave her the instructions on his lunchtime dose. She will have him call with his readings when he gets home.

## 2015-11-05 NOTE — Telephone Encounter (Signed)
He can reduce his lunchtime dose to 8 units.  Did he give you blood sugars after breakfast or lunch?

## 2015-11-05 NOTE — Telephone Encounter (Signed)
No blood sugars were given.  I tried to call back X2, but no answer, and no machine to take a message.  Will call tomorrow and get readings.

## 2015-11-06 ENCOUNTER — Telehealth: Payer: Self-pay | Admitting: Endocrinology

## 2015-11-06 NOTE — Telephone Encounter (Signed)
He can increase his Toujeo insulin to 26 units instead of 22 and keep a written record of his blood sugars

## 2015-11-06 NOTE — Telephone Encounter (Signed)
I spoke with him this morning and he's unable to pull up his readings on his meter, I'll have to show him how next time he comes in.  He did say that they have been running high and this morning before breakfast it was 200.

## 2015-11-06 NOTE — Telephone Encounter (Signed)
Patient called ask you to give him a call, he didn't give any details about what he needed.

## 2015-11-07 NOTE — Telephone Encounter (Signed)
Noted, patient is aware. 

## 2015-11-07 NOTE — Telephone Encounter (Signed)
opended in error

## 2015-11-15 ENCOUNTER — Ambulatory Visit (INDEPENDENT_AMBULATORY_CARE_PROVIDER_SITE_OTHER): Payer: Medicare Other | Admitting: *Deleted

## 2015-11-15 DIAGNOSIS — E538 Deficiency of other specified B group vitamins: Secondary | ICD-10-CM

## 2015-11-15 MED ORDER — CYANOCOBALAMIN 1000 MCG/ML IJ SOLN
1000.0000 ug | Freq: Once | INTRAMUSCULAR | Status: AC
  Administered 2015-11-15: 1000 ug via INTRAMUSCULAR

## 2015-11-15 NOTE — Progress Notes (Signed)
Patient ID: [name], male   DOB: Nov 29, 1928, 82 y.o.   MRN: [id number]  Patient seen in office for Vitamin B 12 injection.   Tolerated IM administration well.

## 2015-11-19 ENCOUNTER — Ambulatory Visit: Payer: Medicare Other | Admitting: Endocrinology

## 2015-12-03 ENCOUNTER — Other Ambulatory Visit: Payer: Self-pay | Admitting: Endocrinology

## 2015-12-06 ENCOUNTER — Other Ambulatory Visit: Payer: Self-pay | Admitting: *Deleted

## 2015-12-06 MED ORDER — FLUTICASONE PROPIONATE 50 MCG/ACT NA SUSP: NASAL | Status: DC

## 2015-12-06 NOTE — Telephone Encounter (Signed)
Received fax requesting refill on Flonase.   Refill appropriate and filled per protocol.  

## 2015-12-14 ENCOUNTER — Other Ambulatory Visit: Payer: Self-pay | Admitting: *Deleted

## 2015-12-14 ENCOUNTER — Other Ambulatory Visit: Payer: Self-pay

## 2015-12-14 MED ORDER — FLUTICASONE PROPIONATE 50 MCG/ACT NA SUSP: NASAL | Status: DC

## 2015-12-14 MED ORDER — GLIMEPIRIDE 4 MG PO TABS: ORAL_TABLET | ORAL | Status: DC

## 2015-12-14 NOTE — Telephone Encounter (Signed)
Received fax requesting refill on flonase.   Refill appropriate and filled per protocol.

## 2015-12-18 ENCOUNTER — Ambulatory Visit (INDEPENDENT_AMBULATORY_CARE_PROVIDER_SITE_OTHER): Payer: Medicare Other | Admitting: Podiatry

## 2015-12-18 ENCOUNTER — Encounter: Payer: Self-pay | Admitting: Podiatry

## 2015-12-18 DIAGNOSIS — E1140 Type 2 diabetes mellitus with diabetic neuropathy, unspecified: Secondary | ICD-10-CM | POA: Diagnosis not present

## 2015-12-18 DIAGNOSIS — M79609 Pain in unspecified limb: Secondary | ICD-10-CM

## 2015-12-18 DIAGNOSIS — B351 Tinea unguium: Secondary | ICD-10-CM | POA: Diagnosis not present

## 2015-12-18 DIAGNOSIS — M79676 Pain in unspecified toe(s): Secondary | ICD-10-CM | POA: Diagnosis not present

## 2015-12-18 NOTE — Progress Notes (Signed)
Patient ID: [name], male   DOB: 1928/08/01, 82 y.o.   MRN: [id number] Complaint:  Visit Type: Patient returns to my office for continued preventative foot care services. Complaint: Patient states" my nails have grown long and thick and become painful to walk and wear shoes" Patient has been diagnosed with DM with no foot complications. The patient presents for preventative foot care services. No changes to ROS.  He has history 1,2 toes left foot secondary to trauma.  Podiatric Exam: Vascular: dorsalis pedis and posterior tibial pulses are palpable bilateral. Capillary return is immediate. Temperature gradient is WNL. Skin turgor WNL  Sensorium: Normal Semmes Weinstein monofilament test. Normal tactile sensation bilaterally. Nail Exam: Pt has thick disfigured discolored nails with subungual debris noted bilateral entire nail hallux through fifth toenails Ulcer Exam: There is no evidence of ulcer or pre-ulcerative changes or infection. Orthopedic Exam: Muscle tone and strength are WNL. No limitations in general ROM. No crepitus or effusions noted. Foot type and digits show no abnormalities. Bony prominences are unremarkable. Skin:  Porokeratosis   plantar aspect left hallux. . No infection or ulcers  Diagnosis:  Onychomycosis, , Pain in right toe, pain in left toes,  Porokeratosis left foot.  Treatment & Plan Procedures and Treatment: Consent by patient was obtained for treatment procedures. The patient understood the discussion of treatment and procedures well. All questions were answered thoroughly reviewed. Debridement of mycotic and hypertrophic toenails, 1 through 5 bilateral and clearing of subungual debris. No ulceration, no infection noted.  Return Visit-Office Procedure: Patient instructed to return to the office for a follow up visit 3 months for continued evaluation and treatment.    Gardiner Barefoot DPM

## 2015-12-31 ENCOUNTER — Other Ambulatory Visit: Payer: Self-pay | Admitting: Endocrinology

## 2015-12-31 DIAGNOSIS — Z794 Long term (current) use of insulin: Principal | ICD-10-CM

## 2015-12-31 DIAGNOSIS — E1165 Type 2 diabetes mellitus with hyperglycemia: Secondary | ICD-10-CM

## 2016-01-01 ENCOUNTER — Ambulatory Visit: Payer: Medicare Other | Admitting: Endocrinology

## 2016-01-02 ENCOUNTER — Telehealth: Payer: Self-pay | Admitting: Endocrinology

## 2016-01-02 ENCOUNTER — Encounter: Payer: Self-pay | Admitting: Endocrinology

## 2016-01-02 ENCOUNTER — Encounter: Payer: Medicare Other | Attending: Endocrinology | Admitting: Nutrition

## 2016-01-02 ENCOUNTER — Ambulatory Visit (INDEPENDENT_AMBULATORY_CARE_PROVIDER_SITE_OTHER): Payer: Medicare Other | Admitting: Endocrinology

## 2016-01-02 VITALS — BP 118/70 | HR 67 | Wt 191.0 lb

## 2016-01-02 DIAGNOSIS — E119 Type 2 diabetes mellitus without complications: Secondary | ICD-10-CM | POA: Diagnosis not present

## 2016-01-02 DIAGNOSIS — Z760 Encounter for issue of repeat prescription: Secondary | ICD-10-CM | POA: Insufficient documentation

## 2016-01-02 DIAGNOSIS — E1165 Type 2 diabetes mellitus with hyperglycemia: Secondary | ICD-10-CM | POA: Diagnosis not present

## 2016-01-02 DIAGNOSIS — Z794 Long term (current) use of insulin: Secondary | ICD-10-CM

## 2016-01-02 NOTE — Telephone Encounter (Signed)
See note below to be advised, Thanks! 

## 2016-01-02 NOTE — Progress Notes (Signed)
Patient ID: JAMAN ZERBE, male   DOB: 01/07/1929, 82 y.o.   MRN: [id number]   Reason for Appointment: Diabetes follow-up   History of Present Illness   Diagnosis: Type 2 DIABETES MELITUS, diagnoses 1972   He has had long-standing diabetes and has been on basal bolus insulin regimen after failure of oral hypoglycemic drugs over the last several years His blood sugars have been generally fairly well controlled but has a tendency to high postprandial readings Previously required only a small dose of 5 units of Levemir insulin  Taking Amaryl in addition to his insulin regimen  his blood sugars had improved his blood sugars during the daytime  RECENT history:   Insulin regimen: Toujeo 26 units at 6 pm   Humalog 8-10 a.c     He has taken Toujeo since 9/16 instead of twice a day Levemir A1c is over 8% as of 1/17, previously 7.4  His blood sugars appear to be persistently high especially fasting  Current management, blood sugar patterns and problems identified  He thinks he is taking his Toujeo insulin fairly consistently although forgot to take it last night.  Again Fasting blood sugars are around 200 and more recently consistently over 200  This is despite increasing his Toujeo insulin by 8 units since his last visit  He has only a couple of readings around lunchtime which are around 200 again  He has only rarely checked his blood sugars in the evenings with 3 out of 5 readings over 200 and only twice near normal, once after supper   He is not able to do very much exercise  Appears to have lost significant weight since his last visit  Also taking Amaryl and metformin with questionable benefit  His wife does not help him currently with his insulin and compliance with self-care  He thinks the couple of weeks ago he felt hypoglycemic in the afternoon and felt better with glucose tablets, no documented hypoglycemia  Oral hypoglycemic drugs: Metformin 1500 mg , Amaryl  4 mg daily in a.m.        Side effects from medications: None          Proper timing of insulin in relation to meals: Yes.         Monitors blood glucose: 1 times a day.    Glucometer: One Touch.          Blood Glucose readings from   Mean values apply above for all meters except median for One Touch  PRE-MEAL Fasting Lunch 6-7 PM  Bedtime Overall  Glucose range: 143-348   90-284  78, 209    Mean/median: 255  212    232    : Meals: 3 meals per day. Bfst 8 am Lunch 12 noon Supper at 5-6 pm; breakfast is oatmeal with eggs.  Drinks water mostly and no sweet drinks           Physical activity: exercise: Sometimes doing recumbent bike      Wt Readings from Last 3 Encounters:  01/02/16 191 lb (86.637 kg)  10/26/15 198 lb 12.8 oz (90.175 kg)  10/22/15 198 lb (89.812 kg)   Lab Results  Component Value Date   HGBA1C 8.3 08/03/2015   HGBA1C 7.4 05/03/2015   HGBA1C 7.8* 05/03/2015   Lab Results  Component Value Date   MICROALBUR 1.2 09/26/2014   LDLCALC 86 08/03/2015   CREATININE 1.27 09/13/2015       Medication List  This list is accurate as of: 01/02/16  3:18 PM.  Always use your most recent med list.               aspirin 81 MG tablet  Take 81 mg by mouth daily.     atenolol 25 MG tablet  Commonly known as:  TENORMIN  Take 12.5-25 mg by mouth daily. 1 in the morning and one-half at bedtime     azelastine 0.1 % nasal spray  Commonly known as:  ASTELIN  Place 1 spray into both nostrils 2 (two) times daily. Use in each nostril as directed     cholecalciferol 1000 units tablet  Commonly known as:  VITAMIN D  Take 1,000 Units by mouth daily.     clotrimazole-betamethasone cream  Commonly known as:  LOTRISONE  Apply 1 application topically 2 (two) times daily.     docusate sodium 100 MG capsule  Commonly known as:  COLACE  Take 100 mg by mouth 2 (two) times daily.     ferrous gluconate 325 MG tablet  Commonly known as:  FERGON  Take 325 mg by mouth daily  with breakfast.     fluticasone 50 MCG/ACT nasal spray  Commonly known as:  FLONASE  PLACE 2 SPRAYS INTO BOTH NOSTRILS DAILY.     glimepiride 4 MG tablet  Commonly known as:  AMARYL  TAKE 1 TABLET (4 MG TOTAL) BY MOUTH DAILY.     HUMALOG KWIKPEN 100 UNIT/ML KiwkPen  Generic drug:  insulin lispro  INJECT 4-5 UNITS THREE TIMES A DAY     Insulin Glargine 300 UNIT/ML Sopn  Commonly known as:  TOUJEO SOLOSTAR  Inject 15 Units into the skin every morning.     Insulin Pen Needle 31G X 5 MM Misc  Commonly known as:  B-D UF III MINI PEN NEEDLES  USE 4 TIMES PER DAY     B-D UF III MINI PEN NEEDLES 31G X 5 MM Misc  Generic drug:  Insulin Pen Needle  USE 4 TIMES PER DAY     metFORMIN 1000 MG tablet  Commonly known as:  GLUCOPHAGE  Take 1,000 mg by mouth 2 (two) times daily with a meal. Take one tablet in am and 1/2 tablet at bedtime     mometasone 0.1 % ointment  Commonly known as:  ELOCON  Apply topically daily.     omeprazole 20 MG capsule  Commonly known as:  PRILOSEC  Take 20 mg by mouth daily.     simvastatin 80 MG tablet  Commonly known as:  ZOCOR  Take 40 mg by mouth at bedtime.     VITAMIN B-12 IJ  Inject 1,000 mcg as directed every 21 ( twenty-one) days.        Allergies:  Allergies  Allergen Reactions  . Avodart [Dutasteride] Swelling  . Ibuprofen Nausea And Vomiting  . Morphine Nausea And Vomiting    Past Medical History  Diagnosis Date  . Diabetes mellitus   . Colon polyps   . BPH (benign prostatic hypertrophy)   . Osteoarthritis   . History of kidney stones   . Hypertension   . Hyperlipidemia   . B12 deficiency   . GERD (gastroesophageal reflux disease)     hiatal hernia    Past Surgical History  Procedure Laterality Date  . Appendectomy    . Partial amputaion left foot    . Knee cartilage surgery      Arthroscope  . Transurethral resection of prostate  06/17/2012  Procedure: TRANSURETHRAL RESECTION OF THE PROSTATE WITH GYRUS  INSTRUMENTS;  Surgeon: Molli Hazard, MD;  Location: [facility];  Service: Urology;  Laterality: N/A;  . Cystoscopy with biopsy  06/17/2012    Procedure: CYSTOSCOPY WITH BIOPSY;  Surgeon: Molli Hazard, MD;  Location: [facility];  Service: Urology;  Laterality: N/A;  . Carotid doppler  01/27/2011    Right & Left ICAs 0-49% diameter reduction, right & left subclavian arteries demonstrated less than 50% diameter reduction.  . Cardiovascular stress test  09/02/2011    Post stress myocardial perfusion images show normal pattern of perfusion in all areas. No significant wall abnormalites noted.  . Transthoracic echocardiogram  09/02/2011    EF >55%, normal LV systolic function, mild diastolic dysfunction    Family History  Problem Relation Age of Onset  . Heart attack Mother   . Hypertension Mother     Social History:  reports that he has quit smoking. He has never used smokeless tobacco. He reports that he does not drink alcohol or use illicit drugs.  Review of Systems:  HYPERTENSION: mild and  well controlled, fairly consistent at home Also This has been followed by PCP  HYPERLIPIDEMIA:   Treated with high-dose simvastatin  with good control  Lab Results  Component Value Date   CHOL 151 08/03/2015   HDL 50.70 08/03/2015   LDLCALC 86 08/03/2015   TRIG 74.0 08/03/2015   CHOLHDL 3 08/03/2015        Examination:   BP 118/70 mmHg  Pulse 67  Wt 191 lb (86.637 kg)  SpO2 96%  Body mass index is 28.19 kg/(m^2).    ASSESSMENT/ PLAN:    Diabetes type 2 requiring insulin   See history of present illness for detailed discussion of his current management, blood sugar patterns and problems identified  He Has marked increase in his blood sugars with fasting readings over 200 consistently despite taking 26 units of basal insulin compared to 18 units on the last visit Difficult to assess his postprandial readings as he is monitoring infrequently but his readings appear to be  recurrently over 200 in the evenings also Appears to be more insulin resistant    Recommendations   Start the [employer] pump with 20 unit basal today, to be instructed by nurse educator.  Discussed in detail the benefits of this compared to insulin and how to use this.  We will verify his out-of-pocket expense also with the company  He will bolus 8 units at breakfast and supper and 6 at lunch for now  Needs short-term follow-up on Friday at least review of blood sugars by phone  Consistent diet  Move Amaryl to the evening to avoid potential hypoglycemia during the day    Patient Instructions  HUMALOG 4 clicks at Bfst, supper and 3 at lunch  More sugars after supper  Change Glimeperide at supper   Check blood sugars on waking up 3-4  times a week Also check blood sugars about 2 hours after a meal and do this after different meals by rotation  Recommended blood sugar levels on waking up is 90-130 and about 2 hours after meal is 130-160  Please bring your blood sugar monitor to each visit, thank you   If unable to see me on Friday this week please call me with blood sugar readings    Counseling time on subjects discussed above is over 50% of today's 25 minute visit   Fleur Audino 01/02/2016, 3:18 PM

## 2016-01-02 NOTE — Patient Instructions (Addendum)
HUMALOG 4 clicks at Bfst, supper and 3 at lunch  More sugars after supper  Change Glimeperide at supper   Check blood sugars on waking up 3-4  times a week Also check blood sugars about 2 hours after a meal and do this after different meals by rotation  Recommended blood sugar levels on waking up is 90-130 and about 2 hours after meal is 130-160  Please bring your blood sugar monitor to each visit, thank you   If unable to see me on Friday this week please call me with blood sugar readings

## 2016-01-02 NOTE — Telephone Encounter (Signed)
Beverly from upstairs stated patient refused his appt with the Dietitian.

## 2016-01-04 ENCOUNTER — Ambulatory Visit (INDEPENDENT_AMBULATORY_CARE_PROVIDER_SITE_OTHER): Payer: Medicare Other | Admitting: Endocrinology

## 2016-01-04 ENCOUNTER — Encounter: Payer: Self-pay | Admitting: Endocrinology

## 2016-01-04 VITALS — BP 132/60 | HR 71 | Ht 69.0 in | Wt 197.0 lb

## 2016-01-04 DIAGNOSIS — E1165 Type 2 diabetes mellitus with hyperglycemia: Secondary | ICD-10-CM | POA: Diagnosis not present

## 2016-01-04 DIAGNOSIS — Z794 Long term (current) use of insulin: Secondary | ICD-10-CM | POA: Diagnosis not present

## 2016-01-04 NOTE — Patient Instructions (Signed)
Toujeo 26 units and Humalog 8 at Bfst and supper and 6 at lunch  Check sugar 4x daily  Call back with sugars next Thursdays

## 2016-01-04 NOTE — Progress Notes (Signed)
Patient ID: [name], male   DOB: 1929/01/01, 82 y.o.   MRN: [id number]   Reason for Appointment: Diabetes follow-up   History of Present Illness   Diagnosis: Type 2 DIABETES MELITUS, diagnoses 1972   He has had long-standing diabetes and has been on basal bolus insulin regimen after failure of oral hypoglycemic drugs over the last several years His blood sugars have been generally fairly well controlled but has a tendency to high postprandial readings Previously required only a small dose of 5 units of Levemir insulin  Taking Amaryl in addition to his insulin regimen  his blood sugars had improved his blood sugars during the daytime  He had taken Toujeo since 9/16 instead of twice a day Levemir  RECENT history:   Insulin regimen: Toujeo 26 units at 6 pm   Humalog 8-10 a.c    A1c is over 8% as of 1/17, previously 7.4  He was instructed on starting the V-go pump on 01/02/16 and is here for follow-up  Current management, blood sugar patterns and problems identified  He was able to use the pump for boluses with starting this  Blood sugar was 350 the first night he was in the pump but not clear if he bolused before supper  Yesterday morning his blood sugar was 84 fasting and after lunch 149, has not done any of the readings  He now says that he does not understand how to fill the pump or understand that he needs to change it every 24 hours.  This is despite his being instructed by nurse educator.  Has not checked his blood sugar today but is still wearing the pump that he started on Wednesday  Oral hypoglycemic drugs: Metformin 1500 mg , Amaryl 4 mg daily in a.m.        Side effects from medications: None          Proper timing of insulin in relation to meals: Yes.         Monitors blood glucose: 1 times a day.    Glucometer: One Touch.          Blood Glucose readings recently as above  Previous average 232 with fasting average 255  : Meals: 3 meals per day.  Bfst 8 am Lunch 12 noon Supper at 5-6 pm; breakfast is oatmeal with eggs.  Drinks water mostly and no sweet drinks           Physical activity: exercise: Sometimes doing recumbent bike      Wt Readings from Last 3 Encounters:  01/04/16 197 lb (89.359 kg)  01/02/16 191 lb (86.637 kg)  10/26/15 198 lb 12.8 oz (90.175 kg)   Lab Results  Component Value Date   HGBA1C 8.3 08/03/2015   HGBA1C 7.4 05/03/2015   HGBA1C 7.8* 05/03/2015   Lab Results  Component Value Date   MICROALBUR 1.2 09/26/2014   LDLCALC 86 08/03/2015   CREATININE 1.27 09/13/2015       Medication List       This list is accurate as of: 01/04/16 11:59 PM.  Always use your most recent med list.               aspirin 81 MG tablet  Take 81 mg by mouth daily.     atenolol 25 MG tablet  Commonly known as:  TENORMIN  Take 12.5-25 mg by mouth daily. 1 in the morning and one-half at bedtime     azelastine 0.1 % nasal spray  Commonly known as:  ASTELIN  Place 1 spray into both nostrils 2 (two) times daily. Use in each nostril as directed     cholecalciferol 1000 units tablet  Commonly known as:  VITAMIN D  Take 1,000 Units by mouth daily.     clotrimazole-betamethasone cream  Commonly known as:  LOTRISONE  Apply 1 application topically 2 (two) times daily.     docusate sodium 100 MG capsule  Commonly known as:  COLACE  Take 100 mg by mouth 2 (two) times daily.     ferrous gluconate 325 MG tablet  Commonly known as:  FERGON  Take 325 mg by mouth daily with breakfast.     fluticasone 50 MCG/ACT nasal spray  Commonly known as:  FLONASE  PLACE 2 SPRAYS INTO BOTH NOSTRILS DAILY.     glimepiride 4 MG tablet  Commonly known as:  AMARYL  TAKE 1 TABLET (4 MG TOTAL) BY MOUTH DAILY.     HUMALOG KWIKPEN 100 UNIT/ML KiwkPen  Generic drug:  insulin lispro  INJECT 4-5 UNITS THREE TIMES A DAY     Insulin Glargine 300 UNIT/ML Sopn  Commonly known as:  TOUJEO SOLOSTAR  Inject 15 Units into the skin every  morning.     Insulin Pen Needle 31G X 5 MM Misc  Commonly known as:  B-D UF III MINI PEN NEEDLES  USE 4 TIMES PER DAY     B-D UF III MINI PEN NEEDLES 31G X 5 MM Misc  Generic drug:  Insulin Pen Needle  USE 4 TIMES PER DAY     metFORMIN 1000 MG tablet  Commonly known as:  GLUCOPHAGE  Take 1,000 mg by mouth 2 (two) times daily with a meal. Take one tablet in am and 1/2 tablet at bedtime     mometasone 0.1 % ointment  Commonly known as:  ELOCON  Apply topically daily.     omeprazole 20 MG capsule  Commonly known as:  PRILOSEC  Take 20 mg by mouth daily.     simvastatin 80 MG tablet  Commonly known as:  ZOCOR  Take 40 mg by mouth at bedtime.     VITAMIN B-12 IJ  Inject 1,000 mcg as directed every 21 ( twenty-one) days.        Allergies:  Allergies  Allergen Reactions  . Avodart [Dutasteride] Swelling  . Ibuprofen Nausea And Vomiting  . Morphine Nausea And Vomiting    Past Medical History  Diagnosis Date  . Diabetes mellitus   . Colon polyps   . BPH (benign prostatic hypertrophy)   . Osteoarthritis   . History of kidney stones   . Hypertension   . Hyperlipidemia   . B12 deficiency   . GERD (gastroesophageal reflux disease)     hiatal hernia    Past Surgical History  Procedure Laterality Date  . Appendectomy    . Partial amputaion left foot    . Knee cartilage surgery      Arthroscope  . Transurethral resection of prostate  06/17/2012    Procedure: TRANSURETHRAL RESECTION OF THE PROSTATE WITH GYRUS INSTRUMENTS;  Surgeon: Molli Hazard, MD;  Location: [facility];  Service: Urology;  Laterality: N/A;  . Cystoscopy with biopsy  06/17/2012    Procedure: CYSTOSCOPY WITH BIOPSY;  Surgeon: Molli Hazard, MD;  Location: [facility];  Service: Urology;  Laterality: N/A;  . Carotid doppler  01/27/2011    Right & Left ICAs 0-49% diameter reduction, right & left subclavian arteries demonstrated less than 50%  diameter reduction.  . Cardiovascular stress test   09/02/2011    Post stress myocardial perfusion images show normal pattern of perfusion in all areas. No significant wall abnormalites noted.  . Transthoracic echocardiogram  09/02/2011    EF >55%, normal LV systolic function, mild diastolic dysfunction    Family History  Problem Relation Age of Onset  . Heart attack Mother   . Hypertension Mother     Social History:  reports that he has quit smoking. He has never used smokeless tobacco. He reports that he does not drink alcohol or use illicit drugs.  Review of Systems:  Following is a copy of the previous note:  HYPERTENSION: mild and  well controlled, fairly consistent at home Also This has been followed by PCP  HYPERLIPIDEMIA:   Treated with high-dose simvastatin  with good control  Lab Results  Component Value Date   CHOL 151 08/03/2015   HDL 50.70 08/03/2015   LDLCALC 86 08/03/2015   TRIG 74.0 08/03/2015   CHOLHDL 3 08/03/2015        Examination:   BP 132/60 mmHg  Pulse 71  Ht 5\' 9"  (1.753 m)  Wt 197 lb (89.359 kg)  BMI 29.08 kg/m2  Body mass index is 29.08 kg/(m^2).    ASSESSMENT/ PLAN:    Diabetes type 2 requiring insulin   See history of present illness for detailed discussion of his current management, blood sugar patterns and problems identified  He Has not followed instructions for using the V-go pump has not changed it since 2 days ago when he started this after instructions from the nurse educator His fasting blood sugar was markedly improved yesterday morning at 84 compared to over 200 before Also glucose after lunch was excellent yesterday   Recommendations   Go back to the Toujeo and Humalog over the weekend  Come back next week for instructions from nurse educator to use the pump  As before he would be bolusing 4 clicks at breakfast and supper and 3 clicks at lunch to start with  He will also be checking the insurance coverage for his out-of-pocket expense  He does want to continue the pump as  this appears to be easier to use and probably more effective  He will call later in the week next week to report his blood sugars 4 bolus adjustment    Patient Instructions  Toujeo 26 units and Humalog 8 at Bfst and supper and 6 at lunch  Check sugar 4x daily  Call back with sugars next Thursdays      [facility] 01/06/2016, 3:12 PM

## 2016-01-08 ENCOUNTER — Encounter: Payer: Medicare Other | Admitting: Nutrition

## 2016-01-08 DIAGNOSIS — Z760 Encounter for issue of repeat prescription: Secondary | ICD-10-CM | POA: Diagnosis not present

## 2016-01-08 DIAGNOSIS — E119 Type 2 diabetes mellitus without complications: Secondary | ICD-10-CM | POA: Diagnosis not present

## 2016-01-08 DIAGNOSIS — E1165 Type 2 diabetes mellitus with hyperglycemia: Secondary | ICD-10-CM

## 2016-01-08 DIAGNOSIS — Z794 Long term (current) use of insulin: Principal | ICD-10-CM

## 2016-01-08 NOTE — Progress Notes (Signed)
[name] and his wife are here again to be re instructed on how to fill and use the V-go.  He filled 2 V-go 20s after instruction from me, with very little assistance.  He was given the simple visual instructions for use to use each time.  He will call me tomorrow when he is starting a new one, so that I can review with him the steps for filling this. He applied the V-go to his r upper abdomen.  He took Toujeo last night, but his blood sugar was 297 at 4PM today.   We reviewed again the need to press the grey buttons--4 times before breakfast and supper and 3 times before lunch.  Written instructions were given for this.  He was also given a sheet to record his blood sugars and was told to test before each meal, and at bedtime, and he agreed to dot his.   He will call me tomorrow at 7:30 to fill and apply a new V-go He was also given a [employer] card with the 800 telephone number to call if more questions.

## 2016-01-08 NOTE — Patient Instructions (Addendum)
Fill and apply a new V-go every day Stop all others insulin Take 3 button presses before your first meal, and 4 button presses before supper. Call if questions

## 2016-01-08 NOTE — Progress Notes (Signed)
Patient and his wife were instructed on how to fill apply, and use the V-go.  They were given a starter kit with directions for use, and an 800 telephone number to call if questions. He filled a V-go with Humalog insulin and applied to his left upper abdomen.  He was shown how to give the meal time insulin and re demonstrated this correctly.  Written instructions were given for 3 button presses before his first meals, and 4 button pressed before supper.  They reported good understanding of this. Also written for him in big letters is to stop the Toujeo.  He reported good understanding of this. A sheet was given to record blood sugars and written instructions were given to test before meals, and at bedtime. They had no final questions.

## 2016-01-08 NOTE — Patient Instructions (Signed)
Apply a new filled V-go every morning. Give 4 button presses before breakfast and supper Give 3 button presses before lunch Test blood sugars before meals and at bedtime.

## 2016-01-09 ENCOUNTER — Telehealth: Payer: Self-pay | Admitting: Nutrition

## 2016-01-09 MED ORDER — INSULIN LISPRO 100 UNIT/ML ~~LOC~~ SOLN: SUBCUTANEOUS | Status: DC

## 2016-01-09 NOTE — Telephone Encounter (Signed)
Kelly from [facility] stated that a prescription was sent for patient, glimepiride (AMARYL) 4 MG tablet, Patient stated that her no longer take that medication. Please advise [phone]

## 2016-01-09 NOTE — Telephone Encounter (Signed)
Error on previous message.  Pt. Did not take Toujeo last night.  He took it the night before coming in to start the V-go (Monday) Pt called back.  He took 4 button presses after Bfast despite me telling him to take 2 button presses.  His  Blood sugar now (1 hr. PcB) is 235

## 2016-01-09 NOTE — Telephone Encounter (Signed)
Please tell him he cannot take with Toujeo when he is using the V-go pump.  Needs to call tomorrow with blood sugar levels

## 2016-01-09 NOTE — Telephone Encounter (Signed)
Megan, please give the following instructions to his wife:  He will need to do 3 button presses of his pump for breakfast and supper and 2 at lunchtime just before eating STOP glimepiride Metformin: Take 1 in the morning and half at dinnertime  We can send a prescription for Humalog vial, using 56 units daily and V-go 20 unit pumps and let her know about it

## 2016-01-09 NOTE — Telephone Encounter (Signed)
I contacted the pt's pharmacy and advised per Dr. Dwyane Dee this Rx has been D/c.

## 2016-01-09 NOTE — Telephone Encounter (Signed)
Pt. Woke up at 6AM said blood sugar was low.  Did not test.  Ate 2 glucose tablets.  Tested 30 min. Later and it was 92.  He put the V-go on yesterday at 4PM.  Blood sugar was 298.  Took Toujeo last night at 9PM.  Ate supper out with church people.  Had about 45 carbs.  TOOK NO BOLUS.  Did not test at HS.  ??  Wanting to eat breakfast.   Told him to eat  Breakfast and take 2 clicks after eating breakfast.

## 2016-01-09 NOTE — Telephone Encounter (Signed)
I contacted the pt and pt's wife and advised of note below. Pt voiced understanding and so did his wife. Rx for humalog sent.

## 2016-01-16 ENCOUNTER — Encounter: Payer: Self-pay | Admitting: Endocrinology

## 2016-01-16 ENCOUNTER — Ambulatory Visit (INDEPENDENT_AMBULATORY_CARE_PROVIDER_SITE_OTHER): Payer: Medicare Other | Admitting: Endocrinology

## 2016-01-16 ENCOUNTER — Ambulatory Visit (INDEPENDENT_AMBULATORY_CARE_PROVIDER_SITE_OTHER): Payer: Medicare Other | Admitting: Family Medicine

## 2016-01-16 VITALS — BP 148/78 | HR 79 | Ht 69.0 in | Wt 195.0 lb

## 2016-01-16 DIAGNOSIS — E1165 Type 2 diabetes mellitus with hyperglycemia: Secondary | ICD-10-CM | POA: Diagnosis not present

## 2016-01-16 DIAGNOSIS — Z794 Long term (current) use of insulin: Secondary | ICD-10-CM

## 2016-01-16 DIAGNOSIS — E538 Deficiency of other specified B group vitamins: Secondary | ICD-10-CM

## 2016-01-16 MED ORDER — CYANOCOBALAMIN 1000 MCG/ML IJ SOLN
1000.0000 ug | Freq: Once | INTRAMUSCULAR | Status: AC
  Administered 2016-01-16: 1000 ug via INTRAMUSCULAR

## 2016-01-16 NOTE — Patient Instructions (Signed)
Toujeo 26 units AT BEDTIME  Humalog 10 at Bfst and supper and 8 at lunch   Check sugar 4x daily   Call back with sugars next [facility]

## 2016-01-16 NOTE — Progress Notes (Signed)
Patient ID: [name], male   DOB: 1928/10/12, 82 y.o.   MRN: [id number]   Reason for Appointment: Diabetes follow-up   History of Present Illness   Diagnosis: Type 2 DIABETES MELITUS, diagnoses 1972   He has had long-standing diabetes and has been on basal bolus insulin regimen after failure of oral hypoglycemic drugs over the last several years His blood sugars have been generally fairly well controlled but has a tendency to high postprandial readings Previously required only a small dose of 5 units of Levemir insulin  Taking Amaryl in addition to his insulin regimen  his blood sugars had improved his blood sugars during the daytime  He had taken Toujeo since 9/16 instead of twice a day Levemir  RECENT history:   Insulin regimen:  Toujeo 26 units at 6 pm   Humalog 05-04-09 a.c    A1c is over 8% as of 1/17, previously 7.4  He was instructed on starting the V-go pump on 01/02/16 and again had reinstruction done the following week However he now says that he will not use the pump since he finds it difficult to fill and prefers to use the pen for his boluses   Current management, blood sugar patterns and problems identified  He has checked blood sugars only in the morning recently  His blood sugar was 98 the morning after he started the pump but that night it was 408, not clear why  His morning sugars are again over 200 although today unexpectedly it was 117  He is quite confused about what insulin is taking especially the Toujeo and initially stated that he was taking 20 units and now says he is taking 26  Has no readings after meals since 6/15  Oral hypoglycemic drugs: Metformin 1500 mg , Amaryl 4 mg daily in a.m.        Side effects from medications: None          Proper timing of insulin in relation to meals: Yes.         Monitors blood glucose: 1 times a day.    Glucometer: One Touch.          Blood Glucose readings recently as above : Meals: 3 meals per  day. Bfst 8 am Lunch 12 noon Supper at 5-6 pm; breakfast is oatmeal with eggs.  Drinks water mostly and no sweet drinks           Physical activity: exercise: Sometimes doing recumbent bike or yard work      [facility] Readings from Last 3 Encounters:  01/16/16 195 lb (88.451 kg)  01/04/16 197 lb (89.359 kg)  01/02/16 191 lb (86.637 kg)   Lab Results  Component Value Date   HGBA1C 8.3 08/03/2015   HGBA1C 7.4 05/03/2015   HGBA1C 7.8* 05/03/2015   Lab Results  Component Value Date   MICROALBUR 1.2 09/26/2014   [place] 86 08/03/2015   CREATININE 1.27 09/13/2015       Medication List       This list is accurate as of: 01/16/16  8:40 PM.  Always use your most recent med list.               aspirin 81 MG tablet  Take 81 mg by mouth daily.     atenolol 25 MG tablet  Commonly known as:  TENORMIN  Take 12.5-25 mg by mouth daily. 1 in the morning and one-half at bedtime     azelastine 0.1 % nasal spray  Commonly known as:  ASTELIN  Place 1 spray into both nostrils 2 (two) times daily. Use in each nostril as directed     cholecalciferol 1000 units tablet  Commonly known as:  VITAMIN D  Take 1,000 Units by mouth daily.     clotrimazole-betamethasone cream  Commonly known as:  LOTRISONE  Apply 1 application topically 2 (two) times daily.     docusate sodium 100 MG capsule  Commonly known as:  COLACE  Take 100 mg by mouth 2 (two) times daily.     ferrous gluconate 325 MG tablet  Commonly known as:  FERGON  Take 325 mg by mouth daily with breakfast.     fluticasone 50 MCG/ACT nasal spray  Commonly known as:  FLONASE  PLACE 2 SPRAYS INTO BOTH NOSTRILS DAILY.     glimepiride 4 MG tablet  Commonly known as:  AMARYL  TAKE 1 TABLET (4 MG TOTAL) BY MOUTH DAILY.     HUMALOG KWIKPEN 100 UNIT/ML KiwkPen  Generic drug:  insulin lispro  INJECT 4-5 UNITS THREE TIMES A DAY     insulin lispro 100 UNIT/ML injection  Commonly known as:  HUMALOG  56 units daily per V-go pump      Insulin Glargine 300 UNIT/ML Sopn  Commonly known as:  TOUJEO SOLOSTAR  Inject 15 Units into the skin every morning.     Insulin Pen Needle 31G X 5 MM Misc  Commonly known as:  B-D UF III MINI PEN NEEDLES  USE 4 TIMES PER DAY     B-D UF III MINI PEN NEEDLES 31G X 5 MM Misc  Generic drug:  Insulin Pen Needle  USE 4 TIMES PER DAY     metFORMIN 1000 MG tablet  Commonly known as:  GLUCOPHAGE  Take 1,000 mg by mouth 2 (two) times daily with a meal. Take one tablet in am and 1/2 tablet at bedtime     mometasone 0.1 % ointment  Commonly known as:  ELOCON  Apply topically daily.     omeprazole 20 MG capsule  Commonly known as:  PRILOSEC  Take 20 mg by mouth daily.     simvastatin 80 MG tablet  Commonly known as:  ZOCOR  Take 40 mg by mouth at bedtime.     VITAMIN B-12 IJ  Inject 1,000 mcg as directed every 21 ( twenty-one) days.        Allergies:  Allergies  Allergen Reactions  . Avodart [Dutasteride] Swelling  . Ibuprofen Nausea And Vomiting  . Morphine Nausea And Vomiting    Past Medical History  Diagnosis Date  . Diabetes mellitus   . Colon polyps   . BPH (benign prostatic hypertrophy)   . Osteoarthritis   . History of kidney stones   . Hypertension   . Hyperlipidemia   . B12 deficiency   . GERD (gastroesophageal reflux disease)     hiatal hernia    Past Surgical History  Procedure Laterality Date  . Appendectomy    . Partial amputaion left foot    . Knee cartilage surgery      Arthroscope  . Transurethral resection of prostate  06/17/2012    Procedure: TRANSURETHRAL RESECTION OF THE PROSTATE WITH GYRUS INSTRUMENTS;  Surgeon: Molli Hazard, MD;  Location: [facility];  Service: Urology;  Laterality: N/A;  . Cystoscopy with biopsy  06/17/2012    Procedure: CYSTOSCOPY WITH BIOPSY;  Surgeon: Molli Hazard, MD;  Location: [facility];  Service: Urology;  Laterality: N/A;  . Carotid  doppler  01/27/2011    Right & Left ICAs 0-49% diameter reduction, right &  left subclavian arteries demonstrated less than 50% diameter reduction.  . Cardiovascular stress test  09/02/2011    Post stress myocardial perfusion images show normal pattern of perfusion in all areas. No significant wall abnormalites noted.  . Transthoracic echocardiogram  09/02/2011    EF >55%, normal LV systolic function, mild diastolic dysfunction    Family History  Problem Relation Age of Onset  . Heart attack Mother   . Hypertension Mother     Social History:  reports that he has quit smoking. He has never used smokeless tobacco. He reports that he does not drink alcohol or use illicit drugs.  Review of Systems:  Following is a copy of the previous note:  HYPERTENSION: mild and  well controlled, fairly consistent at home Also This has been followed by PCP  HYPERLIPIDEMIA:   Treated with high-dose simvastatin  with good control  Lab Results  Component Value Date   CHOL 151 08/03/2015   HDL 50.70 08/03/2015   LDLCALC 86 08/03/2015   TRIG 74.0 08/03/2015   CHOLHDL 3 08/03/2015        Examination:   BP 148/78 mmHg  Pulse 79  Ht 5\' 9"  (1.753 m)  Wt 195 lb (88.451 kg)  BMI 28.78 kg/m2  SpO2 95%  Body mass index is 28.78 kg/(m^2).    ASSESSMENT/ PLAN:    Diabetes type 2 requiring insulin   See history of present illness for detailed discussion of his current management, blood sugar patterns and problems identified  He appears to be having significantly difficult time understanding his diabetes management with insulin He is not able to understand how to use the V-go pump and refuses to continue using it even though his blood sugars appear to be better.  He has been instructed twice on this  He currently is taking Toujeo but not clear what dose he is taking as he had apparently reduced it down to 20 units when he had a low sugar episode He may have taken 26 units last night and fasting glucose was relatively better at 117   Recommendations   Go back to the Toujeo  and Humalog   He will start writing down his insulin doses every time he takes the shot and given him a diary  Also he will start checking his blood sugars at least twice a day preferably 4 times  He will call back to report his readings next Monday  Since he thinks he is taking 10 units of Humalog at breakfast and supper he can continue this and take 8 at lunch  May possibly reduce the dose especially when his fasting readings are better  Continue metformin full dose  He should probably discuss his advancing memory deficit with his PCP as it is making it difficult for him to manage his diabetes and he is insulin-dependent  Patient Instructions  Toujeo 26 units AT BEDTIME  Humalog 10 at Bfst and supper and 8 at lunch   Check sugar 4x daily   Call back with sugars next MONDAY     Counseling time on subjects discussed above is over 50% of today's 25 minute visit   Ndrew Creason 01/16/2016, 8:40 PM

## 2016-01-17 ENCOUNTER — Ambulatory Visit (INDEPENDENT_AMBULATORY_CARE_PROVIDER_SITE_OTHER): Payer: Medicare Other | Admitting: Cardiovascular Disease

## 2016-01-17 ENCOUNTER — Encounter: Payer: Self-pay | Admitting: Cardiovascular Disease

## 2016-01-17 VITALS — BP 112/64 | HR 69 | Ht 68.5 in | Wt 195.0 lb

## 2016-01-17 DIAGNOSIS — I700 Atherosclerosis of aorta: Secondary | ICD-10-CM

## 2016-01-17 DIAGNOSIS — I350 Nonrheumatic aortic (valve) stenosis: Secondary | ICD-10-CM | POA: Diagnosis not present

## 2016-01-17 DIAGNOSIS — I491 Atrial premature depolarization: Secondary | ICD-10-CM

## 2016-01-17 DIAGNOSIS — I10 Essential (primary) hypertension: Secondary | ICD-10-CM | POA: Diagnosis not present

## 2016-01-17 DIAGNOSIS — E119 Type 2 diabetes mellitus without complications: Secondary | ICD-10-CM

## 2016-01-17 DIAGNOSIS — E785 Hyperlipidemia, unspecified: Secondary | ICD-10-CM | POA: Diagnosis not present

## 2016-01-17 DIAGNOSIS — IMO0001 Reserved for inherently not codable concepts without codable children: Secondary | ICD-10-CM

## 2016-01-17 DIAGNOSIS — Z794 Long term (current) use of insulin: Secondary | ICD-10-CM

## 2016-01-17 NOTE — Patient Instructions (Signed)
Medication Instructions:   Your physician recommends that you continue on your current medications as directed. Please refer to the Current Medication list given to you today.   If you need a refill on your cardiac medications before your next appointment, please call your pharmacy.  Labwork: NONE ORDER TODAY'   Testing/Procedures: NONE ORDER TODAY     Follow-Up:  Your physician wants you to follow-up in: [place] will receive a reminder letter in the mail two months in advance. If you don't receive a letter, please call our office to schedule the follow-up appointment.     Any Other Special Instructions Will Be Listed Below (If Applicable).

## 2016-01-19 ENCOUNTER — Encounter: Payer: Self-pay | Admitting: Cardiovascular Disease

## 2016-01-19 DIAGNOSIS — E119 Type 2 diabetes mellitus without complications: Secondary | ICD-10-CM | POA: Insufficient documentation

## 2016-01-19 NOTE — Progress Notes (Signed)
Patient ID: [name], male   DOB: 12-Jun-1929, 82 y.o.   MRN: [id number]      HPI: [name] is a 82 y.o. male who presents for an 8 month cardiology evaluation.  [name] has a history of type 2 diabetes mellitus, hypertension, hyperlipidemia, previously documented grade 1 diastolic dysfunction, and intermittent peripheral edema.  He has been on beta blocker therapy, which has controlled a prior episode of atrial tachycardia, which occurred with stress.  He goes to the [name] and gets his medications at the [name].  He has remained relatively stable.  He is status post TURP has had continues continuous difficulty with urinary incontinence for which he is required to wear a diaper.  He denies any episodes of chest pain.  He denies PND, orthopnea.  He is followed by [name] for his diabetes mellitus and has been on insulin and glimepiride and metformin.   He has a history of hyperlipidemia and has tolerated simvastatin 40 mg daily.  His most recent lipid studies in January 2017 showed a total cholesterol 185, HDL 50, triglycerides 74, VLDL 14, and HDL 86.  One year previously.  His LDL was 66.  He denies any episodes of chest pain or shortness of breath.  At times he notes only trivial ankle swelling.  He denies palpitations.  He presents for reevaluation.  Past Medical History  Diagnosis Date  . Diabetes mellitus   . Colon polyps   . BPH (benign prostatic hypertrophy)   . Osteoarthritis   . History of kidney stones   . Hypertension   . Hyperlipidemia   . B12 deficiency   . GERD (gastroesophageal reflux disease)     hiatal hernia    Past Surgical History  Procedure Laterality Date  . Appendectomy    . Partial amputaion left foot    . Knee cartilage surgery      Arthroscope  . Transurethral resection of prostate  06/17/2012    Procedure: TRANSURETHRAL RESECTION OF THE PROSTATE WITH GYRUS INSTRUMENTS;  Surgeon: [name] Hazard, MD;  Location:  [name];  Service: Urology;  Laterality: N/A;  . Cystoscopy with biopsy  06/17/2012    Procedure: CYSTOSCOPY WITH BIOPSY;  Surgeon: [name] Hazard, MD;  Location: [name];  Service: Urology;  Laterality: N/A;  . Carotid doppler  01/27/2011    Right & Left ICAs 0-49% diameter reduction, right & left subclavian arteries demonstrated less than 50% diameter reduction.  . Cardiovascular stress test  09/02/2011    Post stress myocardial perfusion images show normal pattern of perfusion in all areas. No significant wall abnormalites noted.  . Transthoracic echocardiogram  09/02/2011    EF >55%, normal LV systolic function, mild diastolic dysfunction    Allergies  Allergen Reactions  . Avodart [Dutasteride] Swelling  . Ibuprofen Nausea And Vomiting  . Morphine Nausea And Vomiting    Current Outpatient Prescriptions  Medication Sig Dispense Refill  . aspirin 81 MG tablet Take 81 mg by mouth daily.    Marland Kitchen atenolol (TENORMIN) 25 MG tablet Take 12.5-25 mg by mouth daily. 1 in the morning and one-half at bedtime    . azelastine (ASTELIN) 0.1 % nasal spray Place 1 spray into both nostrils 2 (two) times daily. Use in each nostril as directed 30 mL 3  . B-D UF III MINI PEN NEEDLES 31G X 5 MM MISC USE 4 TIMES PER DAY 130 each 3  . cholecalciferol (VITAMIN D) 1000 UNITS tablet Take  1,000 Units by mouth daily.    . clotrimazole-betamethasone (LOTRISONE) cream Apply 1 application topically 2 (two) times daily. (Patient taking differently: Apply 1 application topically 2 (two) times daily as needed. ) 30 g 0  . Cyanocobalamin (VITAMIN B-12 IJ) Inject 1,000 mcg as directed every 21 ( twenty-one) days.      Marland Kitchen docusate sodium (COLACE) 100 MG capsule Take 100 mg by mouth 2 (two) times daily.    . ferrous gluconate (FERGON) 325 MG tablet Take 325 mg by mouth daily with breakfast.    . fluticasone (FLONASE) 50 MCG/ACT nasal spray PLACE 2 SPRAYS INTO BOTH NOSTRILS DAILY. 48 g 3  . glimepiride (AMARYL) 4 MG tablet  TAKE 1 TABLET (4 MG TOTAL) BY MOUTH DAILY. 90 tablet 1  . HUMALOG KWIKPEN 100 UNIT/ML KiwkPen INJECT 4-5 UNITS THREE TIMES A DAY (Patient taking differently: Inject 10 units breakfast 8 units at lunch and 10 units at supper.) 15 mL 3  . Insulin Glargine (TOUJEO SOLOSTAR) 300 UNIT/ML SOPN Inject 15 Units into the skin every morning. 3 pen 3  . insulin lispro (HUMALOG) 100 UNIT/ML injection 56 units daily per V-go pump 20 mL 2  . Insulin Pen Needle (B-D UF III MINI PEN NEEDLES) 31G X 5 MM MISC USE 4 TIMES PER DAY 125 each 3  . metFORMIN (GLUCOPHAGE) 1000 MG tablet Take 1,000 mg by mouth 2 (two) times daily with a meal. Take one tablet in am and 1/2 tablet at bedtime    . mometasone (ELOCON) 0.1 % ointment Apply topically daily. 45 g 0  . omeprazole (PRILOSEC) 20 MG capsule Take 20 mg by mouth daily.      . simvastatin (ZOCOR) 80 MG tablet Take 40 mg by mouth at bedtime.     No current facility-administered medications for this visit.    Social History   Social History  . Marital Status: Married    Spouse Name: N/A  . Number of Children: N/A  . Years of Education: N/A   Occupational History  . Not on file.   Social History Main Topics  . Smoking status: Former Research scientist (life sciences)  . Smokeless tobacco: Never Used  . Alcohol Use: No  . Drug Use: No  . Sexual Activity: Not on file     Comment: married to [place], retired.   Other Topics Concern  . Not on file   Social History Narrative   Socially, he is married and has 2 children 5 grandchildren one great-grandchild.  There is no tobacco or alcohol use.  He is retired.  Family History  Problem Relation Age of Onset  . Heart attack Mother   . Hypertension Mother     ROS General: Negative; No fevers, chills, or night sweats;  HEENT: Negative; No changes in vision or hearing, sinus congestion, difficulty swallowing Pulmonary: Negative; No cough, wheezing, shortness of breath, hemoptysis Cardiovascular: Negative; No chest pain, presyncope,  syncope, palpitations GI: Negative; No nausea, vomiting, diarrhea, or abdominal pain GU: Positive for incontinence following his TURP Musculoskeletal: Negative; no myalgias, joint pain, or weakness Hematologic/Oncology: Negative; no easy bruising, bleeding Endocrine: Negative; no heat/cold intolerance; no diabetes Neuro: Negative; no changes in balance, headaches Skin: Negative; No rashes or skin lesions Psychiatric: Negative; No behavioral problems, depression Sleep: Negative; No snoring, daytime sleepiness, hypersomnolence, bruxism, restless legs, hypnogognic hallucinations, no cataplexy Other comprehensive 14 point system review is negative.   PE BP 112/64 mmHg  Pulse 69  Ht 5' 8.5" (1.74 m)  Wt 195 lb (88.451  kg)  BMI 29.21 kg/m2   Repeat blood pressure 120/70.  Wt Readings from Last 3 Encounters:  01/17/16 195 lb (88.451 kg)  01/16/16 195 lb (88.451 kg)  01/04/16 197 lb (89.359 kg)   General: Alert, oriented, no distress.  Skin: normal turgor, no rashes HEENT: Normocephalic, atraumatic. Pupils round and reactive; sclera anicteric;no lid lag. Extraocular muscles intact; no xanthelasmas. Nose without nasal septal hypertrophy Mouth/Parynx benign; Mallinpatti scale 3 Neck: No JVD, no carotid bruits; normal carotid upstroke Lungs: clear to ausculatation and percussion; no wheezing or rales Chest wall: no tenderness to palpitation Heart: RRR, s1 s2 normal; 2/6 systolic murmur concordant with his aortic valve disease; no diastolic murmur, rub thrills or heaves Abdomen: soft, nontender; no hepatosplenomehaly, BS+; abdominal aorta nontender and not dilated by palpation. Back: no CVA tenderness Pulses 2+ Extremities: Trace ankle edema, Homan's sign negative  Neurologic: grossly nonfocal; cranial nerves grossly normal. Psychologic: normal affect and mood.  ECG (independently read by me): Normal sinus rhythm at 69 bpm.  No ectopy.  QTc interval 435 ms.  ECG (independently read  by me): Normal sinus rhythm with an occasional PAC.  QTc interval 416 ms.  PR interval 178 ms.  11/27/2014 ECG (independently read by me): Normal sinus rhythm at 60 bpm.  Normal intervals.  No ectopy.  Nonspecific ST changes.  Prior ECG (independently read by me): Sinus rhythm at 60 beats per minute.  No ectopy.  Normal intervals.  LABS: BMP Latest Ref Rng 09/13/2015 08/03/2015 05/03/2015  Glucose 70 - 99 mg/dL 201(H) 263(H) 206(H)  BUN 6 - 23 mg/dL 22 24(H) 21  Creatinine 0.40 - 1.50 mg/dL 1.27 1.24 1.33  Sodium 135 - 145 mEq/L 139 138 140  Potassium 3.5 - 5.1 mEq/L 4.4 4.2 4.6  Chloride 96 - 112 mEq/L 103 103 104  CO2 19 - 32 mEq/L 29 28 29   Calcium 8.4 - 10.5 mg/dL 9.7 9.3 9.2   Hepatic Function Latest Ref Rng 08/03/2015 01/31/2015 09/26/2014  Total Protein 6.0 - 8.3 g/dL 6.8 7.2 6.9  Albumin 3.5 - 5.2 g/dL 4.1 4.1 4.1  AST 0 - 37 U/L 19 40(H) 18  ALT 0 - 53 U/L 21 25 17   Alk Phosphatase 39 - 117 U/L 82 68 78  Total Bilirubin 0.2 - 1.2 mg/dL 0.6 0.6 0.6   CBC Latest Ref Rng 06/17/2012 06/10/2012 02/01/2009  WBC 4.0 - 10.5 K/uL - 8.4 8.4  Hemoglobin 13.0 - 17.0 g/dL 11.0(L) 12.8(L) 8.6(L)  Hematocrit 39.0 - 52.0 % 32.1(L) 38.0(L) 25.1(L)  Platelets 150 - 400 K/uL - 165 114(L)   No results found for: TSH  Lipid Panel     Component Value Date/Time   CHOL 151 08/03/2015 1050   TRIG 74.0 08/03/2015 1050   HDL 50.70 08/03/2015 1050   CHOLHDL 3 08/03/2015 1050   VLDL 14.8 08/03/2015 1050   LDLCALC 86 08/03/2015 1050   Lab Results  Component Value Date   HGBA1C 8.3 08/03/2015   RADIOLOGY: No results found.    ASSESSMENT AND PLAN: Mr. Kaylor Simenson is an 61 -year-old Caucasian gentleman who has documented normal left ventricular size and function with mild grade 1 diastolic dysfunction. An echo Doppler study in February 2013  showed mild aortic sclerosis, which is contributing to his cardiac murmur.  He remains asymptomatic with reference to chest pain, PND, orthopnea, or  significant shortness of breath, presyncope or syncope.  He continues to be on simvastatin 40 mg for his hyperlipidemia.  Previously, her LDLs were in  the 60s and on his most recent blood work his LDL had risen to 55.  When I last saw him, I suggested a follow-up echo Doppler study, but he never had this done.  He continues to be asymptomatic with reference to his aortic valve disease.  His ECG today shows normal sinus rhythm without ectopy.  In the past.  He was previously noted to have PACs.  His blood pressure today is well controlled on atenolol 25 mg in the morning and 12.5 mg at night, which is controlling his ectopy.  He is on Humulin insulin, insulin pen needle, metformin, and Amaryl for his diabetes mellitus.  I have suggested that he be seen at the [place] in 6 months; I will schedule him for one-year cardiology evaluation.  Troy Sine, MD, [facility]  01/19/2016 11:23 AM

## 2016-02-18 ENCOUNTER — Ambulatory Visit (INDEPENDENT_AMBULATORY_CARE_PROVIDER_SITE_OTHER): Payer: Medicare Other | Admitting: Family Medicine

## 2016-02-18 ENCOUNTER — Ambulatory Visit (INDEPENDENT_AMBULATORY_CARE_PROVIDER_SITE_OTHER): Payer: Medicare Other | Admitting: Endocrinology

## 2016-02-18 ENCOUNTER — Encounter: Payer: Self-pay | Admitting: Endocrinology

## 2016-02-18 VITALS — BP 124/62 | HR 71 | Wt 191.0 lb

## 2016-02-18 DIAGNOSIS — E1165 Type 2 diabetes mellitus with hyperglycemia: Secondary | ICD-10-CM | POA: Diagnosis not present

## 2016-02-18 DIAGNOSIS — E538 Deficiency of other specified B group vitamins: Secondary | ICD-10-CM | POA: Diagnosis not present

## 2016-02-18 DIAGNOSIS — Z794 Long term (current) use of insulin: Secondary | ICD-10-CM

## 2016-02-18 MED ORDER — CYANOCOBALAMIN 1000 MCG/ML IJ SOLN
1000.0000 ug | Freq: Once | INTRAMUSCULAR | Status: AC
  Administered 2016-02-18: 1000 ug via INTRAMUSCULAR

## 2016-02-18 MED ORDER — CANAGLIFLOZIN 100 MG PO TABS
ORAL_TABLET | ORAL | 3 refills | Status: DC
Start: 2016-02-18 — End: 2016-05-13

## 2016-02-18 NOTE — Patient Instructions (Signed)
  Toujeo 34 units at supper WITH Humalog  HUMALOG 14 UNITS AT BREAKFAST, 10 AT LUNCH AND SUPPER

## 2016-02-18 NOTE — Progress Notes (Signed)
Patient ID: [name], male   DOB: [birth year], 82 y.o.   MRN: [id number]   Reason for Appointment: Diabetes follow-up   History of Present Illness   Diagnosis: Type 2 DIABETES MELITUS, diagnoses 1972   He has had long-standing diabetes and has been on basal bolus insulin regimen after failure of oral hypoglycemic drugs over the last several years His blood sugars have been generally fairly well controlled but has a tendency to high postprandial readings Previously required only a small dose of 5 units of Levemir insulin  Taking Amaryl in addition to his insulin regimen  his blood sugars had improved his blood sugars during the daytime  He had taken Toujeo since 9/16 instead of twice a day Levemir  RECENT history:   Insulin regimen:  Toujeo 26 units at 6 pm   Humalog 05-04-09 a.c    A1c is now over 10%  He was given a trial of the V-go pump but he could not manage it on his own Back on basal bolus regimen currently   Current management, blood sugar patterns and problems identified  He has checked blood sugars more often before lunch and supper and occasionally at bedtime  However blood sugars are markedly increased  He may forget to take his Toujeo at night as he falls asleep and his wife is not able to remind him to take his insulin  He thinks he is taking 26 units of Toujeo but initially said he was taking 15 units  He was given a diary to write his insulin doses down but he is only writing a few blood sugars but not his insulin doses  Has only sporadic  readings after suppertime but not recently, most of the recent readings are higher than expected  Not clear if he is benefiting from Amaryl, he just ran out  His fasting readings are consistently high with only occasional readings below 200 about 2 or 3 weeks ago  Oral hypoglycemic drugs: Metformin 1500 mg , Amaryl        Side effects from medications: None          Proper timing of insulin in relation  to meals: Yes.         Monitors blood glucose: 1 times a day.    Glucometer: One Touch.          Blood Glucose readings   Mean values apply above for all meters except median for One Touch  PRE-MEAL Fasting Lunch Dinner Bedtime Overall  Glucose range: 162-339  206-456  65-419  213-506    Mean/median: 270 351 290 280 305    Meals: 3 meals per day. Bfst 8 am Lunch 12 noon Supper at 5-6 pm; breakfast is oatmeal with eggs.  Drinks water mostly and no sweet drinks           Physical activity: exercise: Sometimes doing recumbent bike or yard work      [facility] Readings from Last 3 Encounters:  02/18/16 191 lb (86.6 kg)  01/17/16 195 lb (88.5 kg)  01/16/16 195 lb (88.5 kg)   Lab Results  Component Value Date   HGBA1C 8.3 08/03/2015   HGBA1C 7.4 05/03/2015   HGBA1C 7.8 (H) 05/03/2015   Lab Results  Component Value Date   MICROALBUR 1.2 09/26/2014   LDLCALC 86 08/03/2015   CREATININE 1.27 09/13/2015       Medication List       Accurate as of 02/18/16  9:04 PM. Always use  your most recent med list.          aspirin 81 MG tablet Take 81 mg by mouth daily.   atenolol 25 MG tablet Commonly known as:  TENORMIN Take 12.5-25 mg by mouth daily. 1 in the morning and one-half at bedtime   azelastine 0.1 % nasal spray Commonly known as:  ASTELIN Place 1 spray into both nostrils 2 (two) times daily. Use in each nostril as directed   canagliflozin 100 MG Tabs tablet Commonly known as:  INVOKANA 1 tablet before breakfast   cholecalciferol 1000 units tablet Commonly known as:  VITAMIN D Take 1,000 Units by mouth daily.   clotrimazole-betamethasone cream Commonly known as:  LOTRISONE Apply 1 application topically 2 (two) times daily.   docusate sodium 100 MG capsule Commonly known as:  COLACE Take 100 mg by mouth 2 (two) times daily.   ferrous gluconate 325 MG tablet Commonly known as:  FERGON Take 325 mg by mouth daily with breakfast.   fluticasone 50 MCG/ACT nasal  spray Commonly known as:  FLONASE PLACE 2 SPRAYS INTO BOTH NOSTRILS DAILY.   glimepiride 4 MG tablet Commonly known as:  AMARYL TAKE 1 TABLET (4 MG TOTAL) BY MOUTH DAILY.   HUMALOG KWIKPEN 100 UNIT/ML KiwkPen Generic drug:  insulin lispro INJECT 4-5 UNITS THREE TIMES A DAY   insulin lispro 100 UNIT/ML injection Commonly known as:  HUMALOG 56 units daily per V-go pump   Insulin Glargine 300 UNIT/ML Sopn Commonly known as:  TOUJEO SOLOSTAR Inject 15 Units into the skin every morning.   Insulin Pen Needle 31G X 5 MM Misc Commonly known as:  B-D UF III MINI PEN NEEDLES USE 4 TIMES PER DAY   B-D UF III MINI PEN NEEDLES 31G X 5 MM Misc Generic drug:  Insulin Pen Needle USE 4 TIMES PER DAY   metFORMIN 1000 MG tablet Commonly known as:  GLUCOPHAGE Take 1,000 mg by mouth 2 (two) times daily with a meal. Take one tablet in am and 1/2 tablet at bedtime   mometasone 0.1 % ointment Commonly known as:  ELOCON Apply topically daily.   omeprazole 20 MG capsule Commonly known as:  PRILOSEC Take 20 mg by mouth daily.   simvastatin 80 MG tablet Commonly known as:  ZOCOR Take 40 mg by mouth at bedtime.   VITAMIN B-12 IJ Inject 1,000 mcg as directed every 21 ( twenty-one) days.       Allergies:  Allergies  Allergen Reactions  . Avodart [Dutasteride] Swelling  . Ibuprofen Nausea And Vomiting  . Morphine Nausea And Vomiting    Past Medical History:  Diagnosis Date  . B12 deficiency   . BPH (benign prostatic hypertrophy)   . Colon polyps   . Diabetes mellitus   . GERD (gastroesophageal reflux disease)    hiatal hernia  . History of kidney stones   . Hyperlipidemia   . Hypertension   . Osteoarthritis     Past Surgical History:  Procedure Laterality Date  . APPENDECTOMY    . CARDIOVASCULAR STRESS TEST  09/02/2011   Post stress myocardial perfusion images show normal pattern of perfusion in all areas. No significant wall abnormalites noted.  Marland Kitchen CAROTID DOPPLER  01/27/2011    Right & Left ICAs 0-49% diameter reduction, right & left subclavian arteries demonstrated less than 50% diameter reduction.  . CYSTOSCOPY WITH BIOPSY  06/17/2012   Procedure: CYSTOSCOPY WITH BIOPSY;  Surgeon: Molli Hazard, MD;  Location: [facility];  Service: Urology;  Laterality:  N/A;  . KNEE CARTILAGE SURGERY     Arthroscope  . Partial amputaion left foot    . TRANSTHORACIC ECHOCARDIOGRAM  09/02/2011   EF >55%, normal LV systolic function, mild diastolic dysfunction  . TRANSURETHRAL RESECTION OF PROSTATE  06/17/2012   Procedure: TRANSURETHRAL RESECTION OF THE PROSTATE WITH GYRUS INSTRUMENTS;  Surgeon: Molli Hazard, MD;  Location: [facility];  Service: Urology;  Laterality: N/A;    Family History  Problem Relation Age of Onset  . Heart attack Mother   . Hypertension Mother     Social History:  reports that he has quit smoking. He has never used smokeless tobacco. He reports that he does not drink alcohol or use drugs.  Review of Systems:   HYPERTENSION: Does not appear to be on any medications for this This has been followed by PCP  HYPERLIPIDEMIA:   Treated with high-dose simvastatin  with good control  Lab Results  Component Value Date   CHOL 151 08/03/2015   HDL 50.70 08/03/2015   LDLCALC 86 08/03/2015   TRIG 74.0 08/03/2015   CHOLHDL 3 08/03/2015        Examination:   BP 124/62 (BP Location: Left Arm, Patient Position: Sitting)   Pulse 71   Wt 191 lb (86.6 kg)   SpO2 94%   BMI 28.62 kg/m   Body mass index is 28.62 kg/m.    ASSESSMENT/ PLAN:    Diabetes type 2 requiring insulin   See history of present illness for detailed discussion of his current management, blood sugar patterns and problems identified  He appears to be having significantly Hyperglycemia with average blood sugar around 300 now and A1c markedly increased at 10% + With his memory difficulties he has not been able to continue his basal bolus insulin regimen as directed Most likely  is forgetting his basal insulin Also requiring progressively higher doses of insulin indicating insulin deficiency He is not able to understand how to use the V-go pump which would have been more effective  He may not be taking the prescribed dose of Toujeo also and occasionally missing his basal insulin   Recommendations  Trial of Invokana. Discussed action of SGLT 2 drugs on lowering glucose by decreasing kidney absorption of glucose, benefits of weight loss and lower blood pressure, possible side effects including candidiasis and dosage regimen   He will start with 100 mg daily  Encouraged him to increase his fluid intake with this  He will try to keep a diary of his insulin doses  Will increase his Toujeo by 8 units for now  Increase morning Humalog by 4 units for now  He will also call if he has tendency to low blood sugars  Leave of Amaryl  Follow-up in 4 weeks  Patient Instructions   Toujeo 34 units at supper WITH Humalog  HUMALOG 14 UNITS AT BREAKFAST, 10 AT LUNCH AND SUPPER     Counseling time on subjects discussed above is over 50% of today's 25 minute visit   Kasia Trego 02/18/2016, 9:04 PM

## 2016-02-19 ENCOUNTER — Other Ambulatory Visit (INDEPENDENT_AMBULATORY_CARE_PROVIDER_SITE_OTHER): Payer: Medicare Other

## 2016-02-19 DIAGNOSIS — E119 Type 2 diabetes mellitus without complications: Secondary | ICD-10-CM | POA: Diagnosis not present

## 2016-02-19 LAB — POCT GLYCOSYLATED HEMOGLOBIN (HGB A1C): Hemoglobin A1C: 10.3

## 2016-02-22 ENCOUNTER — Ambulatory Visit: Payer: Medicare Other | Admitting: Family Medicine

## 2016-02-22 ENCOUNTER — Telehealth: Payer: Self-pay | Admitting: Endocrinology

## 2016-02-22 NOTE — Telephone Encounter (Signed)
Pt letting us know that the med he is now on for the past 2 days he has had no change so far.

## 2016-02-25 ENCOUNTER — Telehealth: Payer: Self-pay

## 2016-02-25 NOTE — Telephone Encounter (Signed)
He needs to let us know what his blood sugars are for the last 2-3 days when exactly what insulin doses he is taking

## 2016-02-25 NOTE — Telephone Encounter (Signed)
Called patient, he stated he has been taking the toujeo. Also patient will call back with blood sugars he was unable to find where he wrote them down at. Wanted to notify you.

## 2016-02-25 NOTE — Telephone Encounter (Signed)
Need the exact doses of all the insulins that he is taking and blood sugars sorted by time of day He needs to continue taking the [facility]

## 2016-02-28 ENCOUNTER — Telehealth: Payer: Self-pay

## 2016-02-28 NOTE — Telephone Encounter (Signed)
Pt called to report his blood sugar readings. 02/13/2016: 270 (fasting) 02/07/2016: 216 (fasting) 01/31/2016: 246 (fasting)  Pt stated he did not have any other readings at this time to give and wanted to know if any medication adjustments should be made? Pt is taking 34 units Toujeo at supper, 14 units of humalog at breakfast and 10 units with lunch and supper.

## 2016-02-28 NOTE — Telephone Encounter (Signed)
He needs to check some readings after dinner at night.  Otherwise needs to increase his Toujeo to 38 units and continue Invokana

## 2016-02-28 NOTE — Telephone Encounter (Signed)
Attempted to reach the pt. Pt was unavailable and did not have a working vm. Will try again at a later time.

## 2016-02-29 NOTE — Telephone Encounter (Signed)
I contacted the pt and advised of note and voiced understanding.

## 2016-03-03 DIAGNOSIS — M25562 Pain in left knee: Secondary | ICD-10-CM | POA: Diagnosis not present

## 2016-03-03 DIAGNOSIS — M1712 Unilateral primary osteoarthritis, left knee: Secondary | ICD-10-CM | POA: Diagnosis not present

## 2016-03-05 DIAGNOSIS — Z961 Presence of intraocular lens: Secondary | ICD-10-CM | POA: Diagnosis not present

## 2016-03-05 DIAGNOSIS — H02055 Trichiasis without entropian left lower eyelid: Secondary | ICD-10-CM | POA: Diagnosis not present

## 2016-03-05 DIAGNOSIS — H40013 Open angle with borderline findings, low risk, bilateral: Secondary | ICD-10-CM | POA: Diagnosis not present

## 2016-03-05 DIAGNOSIS — H5021 Vertical strabismus, right eye: Secondary | ICD-10-CM | POA: Diagnosis not present

## 2016-03-05 DIAGNOSIS — H02052 Trichiasis without entropian right lower eyelid: Secondary | ICD-10-CM | POA: Diagnosis not present

## 2016-03-05 DIAGNOSIS — E119 Type 2 diabetes mellitus without complications: Secondary | ICD-10-CM | POA: Diagnosis not present

## 2016-03-05 LAB — HM DIABETES EYE EXAM

## 2016-03-07 ENCOUNTER — Ambulatory Visit (INDEPENDENT_AMBULATORY_CARE_PROVIDER_SITE_OTHER): Payer: Medicare Other | Admitting: Family Medicine

## 2016-03-07 VITALS — BP 110/68 | HR 62 | Temp 98.2°F | Resp 14 | Ht 69.0 in | Wt 184.0 lb

## 2016-03-07 DIAGNOSIS — R413 Other amnesia: Secondary | ICD-10-CM

## 2016-03-07 DIAGNOSIS — Z8673 Personal history of transient ischemic attack (TIA), and cerebral infarction without residual deficits: Secondary | ICD-10-CM | POA: Diagnosis not present

## 2016-03-07 NOTE — Progress Notes (Signed)
Subjective:    Patient ID: [name], male    DOB: 12/31/1928, 82 y.o.   MRN: [id number]  HPI Patient recently went to his ophthalmologist. On funduscopic exam he was told that he has had a "stroke" retina. This appears to be old. Although it is not affecting his vision, the son reports memory loss occurring roughly around the same time. Past medical history significant for hypertension, hyperlipidemia, and insulin-dependent diabetes mellitus certainly putting him at risk for stroke. He is here today for evaluation of his memory loss. Son states that the father it over the last 4 months is forgetting where he put numerous objects. He is forgetting appointments. He is forgetting conversations that he is having with his family. Today we performed a Mini-Mental status exam. He is unable to tell me the year correctly. He states that it is "07". He gives the month correct. He is unable to tell me the correct day of the week or the correct day of the month. He is able to tell me the location correctly. He is able to remember 3 objects however 3 minutes later he is only able to remember 1 out of 3 objects. He is unable to spell world in reverse as he forgets to flipped over the arm. When he performs serial sevens, every second or third number he gets incorrect. Therefore on his Mini-Mental status exam, he scores at best 23 out of 30. I then performed a mini-cog/clock drawing.  I asked him to draw the face of a clock and make it say for 30. He drew a circle. He placed the 4 in the correct position. He wrote the number "30" at the 6 position. He put no other numbers on the face of a clock. He forgot to draw the hands on the face of a clock however when I drew a circle with the hands of a clock showing 9:30 and no numbers on the clock, the patient was able to correctly tell me the time. Patient then went underwent a thorough neurologic assessment. Cranial nerves II through XII are grossly intact. Muscle  strength is 5 over 5 equal and symmetric in the upper and lower extremities..  There is no pronator drift on exam. Romberg exam is normal. Cerebellar testing is normal. Past Medical History:  Diagnosis Date  . B12 deficiency   . BPH (benign prostatic hypertrophy)   . Colon polyps   . Diabetes mellitus   . GERD (gastroesophageal reflux disease)    hiatal hernia  . History of kidney stones   . Hyperlipidemia   . Hypertension   . Osteoarthritis    Past Surgical History:  Procedure Laterality Date  . APPENDECTOMY    . CARDIOVASCULAR STRESS TEST  09/02/2011   Post stress myocardial perfusion images show normal pattern of perfusion in all areas. No significant wall abnormalites noted.  Marland Kitchen CAROTID DOPPLER  01/27/2011   Right & Left ICAs 0-49% diameter reduction, right & left subclavian arteries demonstrated less than 50% diameter reduction.  . CYSTOSCOPY WITH BIOPSY  06/17/2012   Procedure: CYSTOSCOPY WITH BIOPSY;  Surgeon: Molli Hazard, MD;  Location: [facility];  Service: Urology;  Laterality: N/A;  . KNEE CARTILAGE SURGERY     Arthroscope  . Partial amputaion left foot    . TRANSTHORACIC ECHOCARDIOGRAM  09/02/2011   EF >55%, normal LV systolic function, mild diastolic dysfunction  . TRANSURETHRAL RESECTION OF PROSTATE  06/17/2012   Procedure: TRANSURETHRAL RESECTION OF THE PROSTATE WITH GYRUS INSTRUMENTS;  Surgeon: Molli Hazard, MD;  Location: [facility];  Service: Urology;  Laterality: N/A;   Current Outpatient Prescriptions on File Prior to Visit  Medication Sig Dispense Refill  . aspirin 81 MG tablet Take 81 mg by mouth daily.    Marland Kitchen atenolol (TENORMIN) 25 MG tablet Take 12.5-25 mg by mouth daily. 1 in the morning and one-half at bedtime    . azelastine (ASTELIN) 0.1 % nasal spray Place 1 spray into both nostrils 2 (two) times daily. Use in each nostril as directed 30 mL 3  . B-D UF III MINI PEN NEEDLES 31G X 5 MM MISC USE 4 TIMES PER DAY 130 each 3  . canagliflozin (INVOKANA)  100 MG TABS tablet 1 tablet before breakfast 30 tablet 3  . cholecalciferol (VITAMIN D) 1000 UNITS tablet Take 1,000 Units by mouth daily.    . Cyanocobalamin (VITAMIN B-12 IJ) Inject 1,000 mcg as directed every 21 ( twenty-one) days.      Marland Kitchen docusate sodium (COLACE) 100 MG capsule Take 100 mg by mouth 2 (two) times daily.    . ferrous gluconate (FERGON) 325 MG tablet Take 325 mg by mouth daily with breakfast.    . fluticasone (FLONASE) 50 MCG/ACT nasal spray PLACE 2 SPRAYS INTO BOTH NOSTRILS DAILY. 48 g 3  . glimepiride (AMARYL) 4 MG tablet TAKE 1 TABLET (4 MG TOTAL) BY MOUTH DAILY. 90 tablet 1  . HUMALOG KWIKPEN 100 UNIT/ML KiwkPen INJECT 4-5 UNITS THREE TIMES A DAY (Patient taking differently: Inject 10 units breakfast 8 units at lunch and 10 units at supper.) 15 mL 3  . Insulin Glargine (TOUJEO SOLOSTAR) 300 UNIT/ML SOPN Inject 15 Units into the skin every morning. 3 pen 3  . insulin lispro (HUMALOG) 100 UNIT/ML injection 56 units daily per V-go pump 20 mL 2  . Insulin Pen Needle (B-D UF III MINI PEN NEEDLES) 31G X 5 MM MISC USE 4 TIMES PER DAY 125 each 3  . metFORMIN (GLUCOPHAGE) 1000 MG tablet Take 1,000 mg by mouth 2 (two) times daily with a meal. Take one tablet in am and 1/2 tablet at bedtime    . mometasone (ELOCON) 0.1 % ointment Apply topically daily. 45 g 0  . omeprazole (PRILOSEC) 20 MG capsule Take 20 mg by mouth daily.      . simvastatin (ZOCOR) 80 MG tablet Take 40 mg by mouth at bedtime.     No current facility-administered medications on file prior to visit.    Allergies  Allergen Reactions  . Avodart [Dutasteride] Swelling  . Ibuprofen Nausea And Vomiting  . Morphine Nausea And Vomiting   Social History   Social History  . Marital status: Married    Spouse name: N/A  . Number of children: N/A  . Years of education: N/A   Occupational History  . Not on file.   Social History Main Topics  . Smoking status: Former Research scientist (life sciences)  . Smokeless tobacco: Never Used  .  Alcohol use No  . Drug use: No  . Sexual activity: Not on file     Comment: married to [place], retired.   Other Topics Concern  . Not on file   Social History Narrative  . No narrative on file      Review of Systems  All other systems reviewed and are negative.      Objective:   Physical Exam  Constitutional: He is oriented to person, place, and time. He appears well-developed and well-nourished. No distress.  HENT:  Head:  Normocephalic and atraumatic.  Eyes: Conjunctivae and EOM are normal. Pupils are equal, round, and reactive to light. Right eye exhibits no discharge. Left eye exhibits no discharge. No scleral icterus.  Neck: Normal range of motion. Neck supple.  Cardiovascular: Normal rate, regular rhythm and normal heart sounds.  Exam reveals no gallop and no friction rub.   No murmur heard. Pulmonary/Chest: Effort normal and breath sounds normal. No respiratory distress. He has no wheezes. He has no rales.  Abdominal: Soft. Bowel sounds are normal.  Neurological: He is alert and oriented to person, place, and time. He has normal reflexes. He displays normal reflexes. No cranial nerve deficit. He exhibits normal muscle tone. Coordination normal.  Skin: He is not diaphoretic.  Vitals reviewed.         Assessment & Plan:  Memory loss - Plan: MR Brain W Wo Contrast  Status post CVA - Plan: MR Brain W Wo Contrast  Patient certainly has short-term memory loss and signs of dementia. However the question is if this is Alzheimer's disease versus vascular dementia. He certainly has risk factors for vascular dementia. There've been signs of a stroke on his recent eye exam. This does seem to be a sudden change in his memory. Therefore went to schedule the patient for an MRI of the brain. If there is evidence of vascular dementia, I would recommend switching his aspirin to Aggrenox. We may also want to optimize his cholesterol medication. If there is no evidence of vascular  dementia, I would recommend treatment for Alzheimer's disease with Aricept initially and then adding Namenda if tolerated.

## 2016-03-10 ENCOUNTER — Other Ambulatory Visit: Payer: Self-pay | Admitting: Family Medicine

## 2016-03-10 DIAGNOSIS — Z139 Encounter for screening, unspecified: Secondary | ICD-10-CM

## 2016-03-13 DIAGNOSIS — M1712 Unilateral primary osteoarthritis, left knee: Secondary | ICD-10-CM | POA: Diagnosis not present

## 2016-03-14 ENCOUNTER — Other Ambulatory Visit: Payer: Medicare Other

## 2016-03-17 ENCOUNTER — Encounter: Payer: Self-pay | Admitting: Family Medicine

## 2016-03-17 ENCOUNTER — Ambulatory Visit (INDEPENDENT_AMBULATORY_CARE_PROVIDER_SITE_OTHER): Payer: Medicare Other | Admitting: Family Medicine

## 2016-03-17 VITALS — BP 150/84 | HR 80 | Temp 98.0°F | Resp 16 | Ht 69.0 in | Wt 179.0 lb

## 2016-03-17 DIAGNOSIS — B029 Zoster without complications: Secondary | ICD-10-CM

## 2016-03-17 LAB — HM DIABETES EYE EXAM

## 2016-03-17 MED ORDER — VALACYCLOVIR HCL 1 G PO TABS
1000.0000 mg | ORAL_TABLET | Freq: Three times a day (TID) | ORAL | 0 refills | Status: DC
Start: 2016-03-17 — End: 2016-08-04

## 2016-03-17 NOTE — Progress Notes (Signed)
Subjective:    Patient ID: [name], male    DOB: 03-14-1929, 82 y.o.   MRN: [id number]  HPI Saturday, the patient developed a rash around his right eye. The rash consists of erythematous papules and vesicles in a dermatomal pattern on his right forehead and encircling his right eye. He denies any vision changes or vision loss. There is no conjunctival erythema. Past Surgical History:  Procedure Laterality Date  . APPENDECTOMY    . CARDIOVASCULAR STRESS TEST  09/02/2011   Post stress myocardial perfusion images show normal pattern of perfusion in all areas. No significant wall abnormalites noted.  Marland Kitchen CAROTID DOPPLER  01/27/2011   Right & Left ICAs 0-49% diameter reduction, right & left subclavian arteries demonstrated less than 50% diameter reduction.  . CYSTOSCOPY WITH BIOPSY  06/17/2012   Procedure: CYSTOSCOPY WITH BIOPSY;  Surgeon: Molli Hazard, MD;  Location: [facility];  Service: Urology;  Laterality: N/A;  . KNEE CARTILAGE SURGERY     Arthroscope  . Partial amputaion left foot    . TRANSTHORACIC ECHOCARDIOGRAM  09/02/2011   EF >55%, normal LV systolic function, mild diastolic dysfunction  . TRANSURETHRAL RESECTION OF PROSTATE  06/17/2012   Procedure: TRANSURETHRAL RESECTION OF THE PROSTATE WITH GYRUS INSTRUMENTS;  Surgeon: Molli Hazard, MD;  Location: [facility];  Service: Urology;  Laterality: N/A;   Current Outpatient Prescriptions on File Prior to Visit  Medication Sig Dispense Refill  . aspirin 81 MG tablet Take 81 mg by mouth daily.    Marland Kitchen atenolol (TENORMIN) 25 MG tablet Take 12.5-25 mg by mouth daily. 1 in the morning and one-half at bedtime    . azelastine (ASTELIN) 0.1 % nasal spray Place 1 spray into both nostrils 2 (two) times daily. Use in each nostril as directed 30 mL 3  . B-D UF III MINI PEN NEEDLES 31G X 5 MM MISC USE 4 TIMES PER DAY 130 each 3  . canagliflozin (INVOKANA) 100 MG TABS tablet 1 tablet before breakfast 30 tablet 3  . cholecalciferol  (VITAMIN D) 1000 UNITS tablet Take 1,000 Units by mouth daily.    . Cyanocobalamin (VITAMIN B-12 IJ) Inject 1,000 mcg as directed every 21 ( twenty-one) days.      Marland Kitchen docusate sodium (COLACE) 100 MG capsule Take 100 mg by mouth 2 (two) times daily.    . ferrous gluconate (FERGON) 325 MG tablet Take 325 mg by mouth daily with breakfast.    . fluticasone (FLONASE) 50 MCG/ACT nasal spray PLACE 2 SPRAYS INTO BOTH NOSTRILS DAILY. 48 g 3  . glimepiride (AMARYL) 4 MG tablet TAKE 1 TABLET (4 MG TOTAL) BY MOUTH DAILY. 90 tablet 1  . HUMALOG KWIKPEN 100 UNIT/ML KiwkPen INJECT 4-5 UNITS THREE TIMES A DAY (Patient taking differently: Inject 10 units breakfast 8 units at lunch and 10 units at supper.) 15 mL 3  . Insulin Glargine (TOUJEO SOLOSTAR) 300 UNIT/ML SOPN Inject 15 Units into the skin every morning. (Patient taking differently: Inject 26 Units into the skin at bedtime. ) 3 pen 3  . insulin lispro (HUMALOG) 100 UNIT/ML injection 56 units daily per V-go pump 20 mL 2  . Insulin Pen Needle (B-D UF III MINI PEN NEEDLES) 31G X 5 MM MISC USE 4 TIMES PER DAY 125 each 3  . metFORMIN (GLUCOPHAGE) 1000 MG tablet Take 1,000 mg by mouth 2 (two) times daily with a meal. Take one tablet in am and 1/2 tablet at bedtime    . mometasone (ELOCON) 0.1 %  ointment Apply topically daily. 45 g 0  . omeprazole (PRILOSEC) 20 MG capsule Take 20 mg by mouth daily.      . simvastatin (ZOCOR) 80 MG tablet Take 40 mg by mouth at bedtime.     No current facility-administered medications on file prior to visit.    Allergies  Allergen Reactions  . Avodart [Dutasteride] Swelling  . Ibuprofen Nausea And Vomiting  . Morphine Nausea And Vomiting   Social History   Social History  . Marital status: Married    Spouse name: N/A  . Number of children: N/A  . Years of education: N/A   Occupational History  . Not on file.   Social History Main Topics  . Smoking status: Former Research scientist (life sciences)  . Smokeless tobacco: Never Used  . Alcohol  use No  . Drug use: No  . Sexual activity: Not on file     Comment: married to [place], retired.   Other Topics Concern  . Not on file   Social History Narrative  . No narrative on file    .pmh   Review of Systems  All other systems reviewed and are negative.      Objective:   Physical Exam  Cardiovascular: Normal rate, regular rhythm and normal heart sounds.   Pulmonary/Chest: Effort normal and breath sounds normal.  Skin: Skin is warm. Rash noted. There is erythema.  Vitals reviewed.         Assessment & Plan:  Shingles - Plan: valACYclovir (VALTREX) 1000 MG tablet This is obviously shingles. Begin Valtrex 1 g by mouth 3 times a day for 7 days. Consult ophthalmology to rule out conjunctival involvement.

## 2016-03-18 ENCOUNTER — Ambulatory Visit: Payer: Medicare Other | Admitting: Podiatry

## 2016-03-19 ENCOUNTER — Other Ambulatory Visit: Payer: Medicare Other

## 2016-03-19 ENCOUNTER — Ambulatory Visit: Payer: Medicare Other | Admitting: Endocrinology

## 2016-03-21 ENCOUNTER — Encounter: Payer: Self-pay | Admitting: Family Medicine

## 2016-03-21 DIAGNOSIS — M1712 Unilateral primary osteoarthritis, left knee: Secondary | ICD-10-CM | POA: Diagnosis not present

## 2016-03-24 DIAGNOSIS — B029 Zoster without complications: Secondary | ICD-10-CM | POA: Diagnosis not present

## 2016-03-27 DIAGNOSIS — M1712 Unilateral primary osteoarthritis, left knee: Secondary | ICD-10-CM | POA: Diagnosis not present

## 2016-04-08 ENCOUNTER — Ambulatory Visit
Admission: RE | Admit: 2016-04-08 | Discharge: 2016-04-08 | Disposition: A | Discharge status: Home / Self Care | Payer: Medicare Other | Source: Ambulatory Visit | Attending: Family Medicine | Admitting: Family Medicine

## 2016-04-08 DIAGNOSIS — R413 Other amnesia: Secondary | ICD-10-CM

## 2016-04-08 DIAGNOSIS — Z139 Encounter for screening, unspecified: Secondary | ICD-10-CM

## 2016-04-08 DIAGNOSIS — Z01818 Encounter for other preprocedural examination: Secondary | ICD-10-CM | POA: Diagnosis not present

## 2016-04-08 DIAGNOSIS — Z8673 Personal history of transient ischemic attack (TIA), and cerebral infarction without residual deficits: Secondary | ICD-10-CM

## 2016-04-08 MED ORDER — GADOBENATE DIMEGLUMINE 529 MG/ML IV SOLN
15.0000 mL | Freq: Once | INTRAVENOUS | Status: AC | PRN
  Administered 2016-04-08: 15 mL via INTRAVENOUS

## 2016-04-09 ENCOUNTER — Telehealth: Payer: Self-pay | Admitting: Nutrition

## 2016-04-09 NOTE — Telephone Encounter (Signed)
Message left on machine to call me to set up an appt. For Reclast infusion for his osteoporoso

## 2016-04-10 ENCOUNTER — Other Ambulatory Visit: Payer: Self-pay | Admitting: Family Medicine

## 2016-04-10 MED ORDER — DONEPEZIL HCL 5 MG PO TABS: 5.0000 mg | ORAL_TABLET | Freq: Every day | ORAL | 0 refills | Status: DC

## 2016-04-17 ENCOUNTER — Ambulatory Visit (INDEPENDENT_AMBULATORY_CARE_PROVIDER_SITE_OTHER): Payer: Medicare Other | Admitting: Endocrinology

## 2016-04-17 ENCOUNTER — Encounter: Payer: Self-pay | Admitting: Endocrinology

## 2016-04-17 VITALS — BP 114/58 | HR 64 | Temp 98.0°F | Resp 16 | Ht 69.0 in | Wt 176.8 lb

## 2016-04-17 DIAGNOSIS — E1165 Type 2 diabetes mellitus with hyperglycemia: Secondary | ICD-10-CM

## 2016-04-17 DIAGNOSIS — Z794 Long term (current) use of insulin: Secondary | ICD-10-CM

## 2016-04-17 DIAGNOSIS — E785 Hyperlipidemia, unspecified: Secondary | ICD-10-CM

## 2016-04-17 NOTE — Progress Notes (Signed)
Patient ID: [name], male   DOB: August 20, 1928, 82 y.o.   MRN: [id number]   Reason for Appointment: Diabetes follow-up   History of Present Illness   Diagnosis: Type 2 DIABETES MELITUS, diagnoses 1972   He has had long-standing diabetes and has been on basal bolus insulin regimen after failure of oral hypoglycemic drugs over the last several years His blood sugars have been generally fairly well controlled but has a tendency to high postprandial readings Previously required only a small dose of 5 units of Levemir insulin  Taking Amaryl in addition to his insulin regimen  his blood sugars had improved his blood sugars during the daytime  He had taken Toujeo since 9/16 instead of twice a day Levemir  RECENT history:   Insulin regimen:  Toujeo 20 units at bedtime   Humalog 05-04-09 a.c    A1c on the last visit was over 10%  He was given a trial of the V-go pump but he could not manage it on his own  Current management, blood sugar patterns and problems identified  He has started Invokana on his last visit but did not follow-up as scheduled  His blood sugars appear to be overall better  He is inconsistent with what he thinks he is taking for his Toujeo, he thinks he is taking 20 units are more  He may be more compliant with taking his Toujeo consistently in the evening, his family is helping him with compliance  His fasting readings are not as consistently high with the last few readings below 200  Has only a few readings around suppertime which are mostly high but yesterday only 85  He thinks he gets low sugar symptoms when blood sugars are close to 80  He has lost weight even though he thinks he is not eating differently  Oral hypoglycemic drugs: Metformin 1500 mg , Invokana 100 mg daily        Side effects from medications: None          Proper timing of insulin in relation to meals: Yes.         Monitors blood glucose: 1 times a day.    Glucometer: One  Touch.          Blood Glucose readings   Mean values apply above for all meters except median for One Touch  PRE-MEAL Fasting Lunch Dinner Bedtime Overall  Glucose range: 79-279  198, 392  85-448     Mean/median:     215    Meals: 3 meals per day. Bfst 8 am Lunch 12 noon Supper at 5-6 pm; breakfast is oatmeal with eggs.  Drinks water mostly and no sweet drinks, Occasionally diet Pepsi            Physical activity: exercise: Sometimes doing recumbent bike or yard work      [facility] Readings from Last 3 Encounters:  04/17/16 176 lb 12.8 oz (80.2 kg)  03/17/16 179 lb (81.2 kg)  03/07/16 184 lb (83.5 kg)   Lab Results  Component Value Date   HGBA1C 10.3 02/19/2016   HGBA1C 8.3 08/03/2015   HGBA1C 7.4 05/03/2015   Lab Results  Component Value Date   MICROALBUR 1.2 09/26/2014   LDLCALC 86 08/03/2015   CREATININE 1.27 09/13/2015       Medication List       Accurate as of 04/17/16  9:32 AM. Always use your most recent med list.  aspirin 81 MG tablet Take 81 mg by mouth daily.   atenolol 25 MG tablet Commonly known as:  TENORMIN Take 12.5-25 mg by mouth daily. 1 in the morning and one-half at bedtime   azelastine 0.1 % nasal spray Commonly known as:  ASTELIN Place 1 spray into both nostrils 2 (two) times daily. Use in each nostril as directed   canagliflozin 100 MG Tabs tablet Commonly known as:  INVOKANA 1 tablet before breakfast   cholecalciferol 1000 units tablet Commonly known as:  VITAMIN D Take 1,000 Units by mouth daily.   docusate sodium 100 MG capsule Commonly known as:  COLACE Take 100 mg by mouth 2 (two) times daily.   donepezil 5 MG tablet Commonly known as:  ARICEPT Take 1 tablet (5 mg total) by mouth at bedtime. No refills - if tolerated will increase to 10mg  in 1 month   ferrous gluconate 325 MG tablet Commonly known as:  FERGON Take 325 mg by mouth daily with breakfast.   fluticasone 50 MCG/ACT nasal spray Commonly known as:   FLONASE PLACE 2 SPRAYS INTO BOTH NOSTRILS DAILY.   glimepiride 4 MG tablet Commonly known as:  AMARYL TAKE 1 TABLET (4 MG TOTAL) BY MOUTH DAILY.   HUMALOG KWIKPEN 100 UNIT/ML KiwkPen Generic drug:  insulin lispro INJECT 4-5 UNITS THREE TIMES A DAY   insulin lispro 100 UNIT/ML injection Commonly known as:  HUMALOG 56 units daily per V-go pump   Insulin Glargine 300 UNIT/ML Sopn Commonly known as:  TOUJEO SOLOSTAR Inject 15 Units into the skin every morning.   Insulin Pen Needle 31G X 5 MM Misc Commonly known as:  B-D UF III MINI PEN NEEDLES USE 4 TIMES PER DAY   B-D UF III MINI PEN NEEDLES 31G X 5 MM Misc Generic drug:  Insulin Pen Needle USE 4 TIMES PER DAY   metFORMIN 1000 MG tablet Commonly known as:  GLUCOPHAGE Take 1,000 mg by mouth 2 (two) times daily with a meal. Take one tablet in am and 1/2 tablet at bedtime   mometasone 0.1 % ointment Commonly known as:  ELOCON Apply topically daily.   omeprazole 20 MG capsule Commonly known as:  PRILOSEC Take 20 mg by mouth daily.   simvastatin 80 MG tablet Commonly known as:  ZOCOR Take 40 mg by mouth at bedtime.   valACYclovir 1000 MG tablet Commonly known as:  VALTREX Take 1 tablet (1,000 mg total) by mouth 3 (three) times daily.   VITAMIN B-12 IJ Inject 1,000 mcg as directed every 21 ( twenty-one) days.       Allergies:  Allergies  Allergen Reactions  . Avodart [Dutasteride] Swelling  . Ibuprofen Nausea And Vomiting  . Morphine Nausea And Vomiting    Past Medical History:  Diagnosis Date  . B12 deficiency   . BPH (benign prostatic hypertrophy)   . Colon polyps   . Diabetes mellitus   . GERD (gastroesophageal reflux disease)    hiatal hernia  . History of kidney stones   . Hyperlipidemia   . Hypertension   . Osteoarthritis     Past Surgical History:  Procedure Laterality Date  . APPENDECTOMY    . CARDIOVASCULAR STRESS TEST  09/02/2011   Post stress myocardial perfusion images show normal  pattern of perfusion in all areas. No significant wall abnormalites noted.  Marland Kitchen CAROTID DOPPLER  01/27/2011   Right & Left ICAs 0-49% diameter reduction, right & left subclavian arteries demonstrated less than 50% diameter reduction.  . CYSTOSCOPY  WITH BIOPSY  06/17/2012   Procedure: CYSTOSCOPY WITH BIOPSY;  Surgeon: Molli Hazard, MD;  Location: [facility];  Service: Urology;  Laterality: N/A;  . KNEE CARTILAGE SURGERY     Arthroscope  . Partial amputaion left foot    . TRANSTHORACIC ECHOCARDIOGRAM  09/02/2011   EF >55%, normal LV systolic function, mild diastolic dysfunction  . TRANSURETHRAL RESECTION OF PROSTATE  06/17/2012   Procedure: TRANSURETHRAL RESECTION OF THE PROSTATE WITH GYRUS INSTRUMENTS;  Surgeon: Molli Hazard, MD;  Location: [facility];  Service: Urology;  Laterality: N/A;    Family History  Problem Relation Age of Onset  . Heart attack Mother   . Hypertension Mother     Social History:  reports that he has quit smoking. He has never used smokeless tobacco. He reports that he does not drink alcohol or use drugs.  Review of Systems:   HYPERTENSION: Minimal, only on low-dose atenolol This has been followed by PCP  HYPERLIPIDEMIA:   Treated with high-dose simvastatin  with good control, needs follow-up  Lab Results  Component Value Date   CHOL 151 08/03/2015   HDL 50.70 08/03/2015   LDLCALC 86 08/03/2015   TRIG 74.0 08/03/2015   CHOLHDL 3 08/03/2015        Examination:   BP (!) 114/58   Pulse 64   Temp 98 F (36.7 C)   Resp 16   Ht 5\' 9"  (1.753 m)   Wt 176 lb 12.8 oz (80.2 kg)   SpO2 90%   BMI 26.11 kg/m   Body mass index is 26.11 kg/m.    ASSESSMENT/ PLAN:    Diabetes type 2 requiring insulin   See history of present illness for detailed discussion of his current management, blood sugar patterns and problems identified  He appears to be having Better blood sugar control with adding Invokana and somewhat improved compliance with insulin  regimen Previously blood sugars are averaging over 300 and home Checking blood sugars and was leaving the morning and only sporadically at suppertime  Most likely is compliant with Toujeo in the evening lately as blood sugars are not high as before However he is inconsistent with reporting the dose he is taking and may be taking more on some days  However still has periodic high readings around suppertime His family is helping him with compliance  With his memory difficulties he has not been able to continue his basal bolus insulin regimen as directed and not keeping a record in  diary as directed    Recommendations   Showed him how to keep a record of his insulin doses each time he takes it  He will take his Toujeo.  At 8  P.m. with his other medications  Advised him not to change the dose of Toujeo and stay with 20 units daily  For now not needing change and mealtime insulin unless he has glucose reading out of range  Advised him to check readings after supper  Reminded him that if he takes more Toujeo he may tend to get low sugars at any time of the day  Follow-up in 6 weeks  Recheck labs including lipids and chemistry today  He will call or his family can call if he has blood sugars significantly out of range or hypoglycemia  Patient Instructions  Check more readings at bedtime  Write dowm all insulin doses   Counseling time on subjects discussed above is over 50% of today's 25 minute visit   Guerry Covington 04/17/2016, 9:32  AM

## 2016-04-17 NOTE — Patient Instructions (Signed)
Check more readings at bedtime  Write dowm all insulin doses

## 2016-04-18 DIAGNOSIS — Z23 Encounter for immunization: Secondary | ICD-10-CM | POA: Diagnosis not present

## 2016-04-22 ENCOUNTER — Other Ambulatory Visit: Payer: Self-pay | Admitting: Endocrinology

## 2016-05-06 ENCOUNTER — Encounter: Payer: Self-pay | Admitting: Podiatry

## 2016-05-06 ENCOUNTER — Ambulatory Visit (INDEPENDENT_AMBULATORY_CARE_PROVIDER_SITE_OTHER): Payer: Medicare Other | Admitting: Podiatry

## 2016-05-06 VITALS — Ht 69.0 in | Wt 176.0 lb

## 2016-05-06 DIAGNOSIS — M79609 Pain in unspecified limb: Secondary | ICD-10-CM

## 2016-05-06 DIAGNOSIS — B351 Tinea unguium: Secondary | ICD-10-CM

## 2016-05-06 DIAGNOSIS — E1140 Type 2 diabetes mellitus with diabetic neuropathy, unspecified: Secondary | ICD-10-CM

## 2016-05-06 DIAGNOSIS — M79676 Pain in unspecified toe(s): Secondary | ICD-10-CM | POA: Diagnosis not present

## 2016-05-06 LAB — HM DIABETES FOOT EXAM: HM DIABETIC FOOT EXAM: NORMAL

## 2016-05-06 NOTE — Progress Notes (Signed)
Patient ID: [name], male   DOB: 02/28/1929, 82 y.o.   MRN: [id number] Complaint:  Visit Type: Patient returns to my office for continued preventative foot care services. Complaint: Patient states" my nails have grown long and thick and become painful to walk and wear shoes" Patient has been diagnosed with DM with no foot complications. The patient presents for preventative foot care services. No changes to ROS.  He has history 1,2 toes left foot secondary to trauma.  Podiatric Exam: Vascular: dorsalis pedis and posterior tibial pulses are palpable bilateral. Capillary return is immediate. Temperature gradient is WNL. Skin turgor WNL  Sensorium: Normal Semmes Weinstein monofilament test. Normal tactile sensation bilaterally. Nail Exam: Pt has thick disfigured discolored nails with subungual debris noted bilateral entire nail hallux through fifth toenails Ulcer Exam: There is no evidence of ulcer or pre-ulcerative changes or infection. Orthopedic Exam: Muscle tone and strength are WNL. No limitations in general ROM. No crepitus or effusions noted. Foot type and digits show no abnormalities. Bony prominences are unremarkable. Skin:  Porokeratosis   plantar aspect left hallux. . No infection or ulcers  Diagnosis:  Onychomycosis, , Pain in right toe, pain in left toes,  Porokeratosis left foot.  Treatment & Plan Procedures and Treatment: Consent by patient was obtained for treatment procedures. The patient understood the discussion of treatment and procedures well. All questions were answered thoroughly reviewed. Debridement of mycotic and hypertrophic toenails, 1 through 5 bilateral and clearing of subungual debris. No ulceration, no infection noted.  Return Visit-Office Procedure: Patient instructed to return to the office for a follow up visit 3 months for continued evaluation and treatment.    Leviathan Macera DPM 

## 2016-05-09 ENCOUNTER — Other Ambulatory Visit: Payer: Self-pay | Admitting: Family Medicine

## 2016-05-09 DIAGNOSIS — M1712 Unilateral primary osteoarthritis, left knee: Secondary | ICD-10-CM | POA: Diagnosis not present

## 2016-05-09 DIAGNOSIS — M25562 Pain in left knee: Secondary | ICD-10-CM | POA: Diagnosis not present

## 2016-05-13 ENCOUNTER — Ambulatory Visit (INDEPENDENT_AMBULATORY_CARE_PROVIDER_SITE_OTHER): Payer: Medicare Other | Admitting: Family Medicine

## 2016-05-13 ENCOUNTER — Other Ambulatory Visit: Payer: Self-pay | Admitting: *Deleted

## 2016-05-13 DIAGNOSIS — E538 Deficiency of other specified B group vitamins: Secondary | ICD-10-CM

## 2016-05-13 MED ORDER — CYANOCOBALAMIN 1000 MCG/ML IJ SOLN
1000.0000 ug | Freq: Once | INTRAMUSCULAR | Status: AC
  Administered 2016-05-13: 1000 ug via INTRAMUSCULAR

## 2016-05-13 MED ORDER — CANAGLIFLOZIN 100 MG PO TABS: ORAL_TABLET | ORAL | 1 refills | Status: DC

## 2016-06-02 ENCOUNTER — Ambulatory Visit (INDEPENDENT_AMBULATORY_CARE_PROVIDER_SITE_OTHER): Payer: Medicare Other | Admitting: Endocrinology

## 2016-06-02 ENCOUNTER — Encounter: Payer: Self-pay | Admitting: Endocrinology

## 2016-06-02 VITALS — BP 118/60 | HR 76 | Ht 69.0 in | Wt 173.0 lb

## 2016-06-02 DIAGNOSIS — Z794 Long term (current) use of insulin: Secondary | ICD-10-CM | POA: Diagnosis not present

## 2016-06-02 DIAGNOSIS — E1165 Type 2 diabetes mellitus with hyperglycemia: Secondary | ICD-10-CM | POA: Diagnosis not present

## 2016-06-02 LAB — COMPREHENSIVE METABOLIC PANEL
ALT: 15 U/L (ref 0–53)
AST: 15 U/L (ref 0–37)
Albumin: 4.3 g/dL (ref 3.5–5.2)
Alkaline Phosphatase: 75 U/L (ref 39–117)
BUN: 29 mg/dL — ABNORMAL HIGH (ref 6–23)
CO2: 30 meq/L (ref 19–32)
Calcium: 9.9 mg/dL (ref 8.4–10.5)
Chloride: 103 mEq/L (ref 96–112)
Creatinine, Ser: 1.27 mg/dL (ref 0.40–1.50)
GFR: 57.27 mL/min — AB (ref >60.00)
GLUCOSE: 177 mg/dL — AB (ref 70–99)
POTASSIUM: 4 meq/L (ref 3.5–5.1)
Sodium: 141 mEq/L (ref 135–145)
Total Bilirubin: 0.7 mg/dL (ref 0.2–1.2)
Total Protein: 7.1 g/dL (ref 6.0–8.3)

## 2016-06-02 LAB — MICROALBUMIN / CREATININE URINE RATIO
CREATININE, U: 67.9 mg/dL
MICROALB/CREAT RATIO: 1.5 mg/g (ref 0.0–30.0)
Microalb, Ur: 1 mg/dL (ref 0.0–1.9)

## 2016-06-02 LAB — LIPID PANEL
CHOL/HDL RATIO: 3
Cholesterol: 147 mg/dL (ref 0–200)
HDL: 57.5 mg/dL (ref >39.00)
LDL CALC: 76 mg/dL (ref 0–99)
NONHDL: 89.66
Triglycerides: 66 mg/dL (ref 0.0–149.0)
VLDL: 13.2 mg/dL (ref 0.0–40.0)

## 2016-06-02 LAB — POCT GLYCOSYLATED HEMOGLOBIN (HGB A1C): Hemoglobin A1C: 8

## 2016-06-02 NOTE — Patient Instructions (Addendum)
NEW insulin doses:  TOUJEO insulin, this is the once a day insulin that you are taking at 8:00 PM with the other medications:  Reduce the dose to 18 units of the Toujeo  Humalog 8 units before BREAKFAST and 8 units before LUNCH and SUPPER

## 2016-06-02 NOTE — Progress Notes (Signed)
Patient ID: [name], male   DOB: 03-03-1929, 82 y.o.   MRN: [id number]   Reason for Appointment: Diabetes follow-up   History of Present Illness   Diagnosis: Type 2 DIABETES MELITUS, diagnoses 1972   He has had long-standing diabetes and has been on basal bolus insulin regimen after failure of oral hypoglycemic drugs over the last several years His blood sugars have been generally fairly well controlled but has a tendency to high postprandial readings Previously required only a small dose of 5 units of Levemir insulin  Taking Amaryl in addition to his insulin regimen  his blood sugars had improved his blood sugars during the daytime  He had taken Toujeo since 9/16 instead of twice a day Levemir  RECENT history:   Insulin regimen:  Toujeo 20 units at bedtime   Humalog 05-04-09 a.c    A1c on the last visit was over 10% and now down to 8%  Current management, blood sugar patterns and problems identified  He has had difficulty complying with his insulin regimen and glucose monitoring because of his dementia  His son is not helping him periodically to monitor his day-to-day management  Also he is not getting some help at home to cook for him and he is apparently improving her diet with the healthier meal plan now  His blood sugars appear to be overall significantly better  He is reportedly taking his Toujeo around bedtime although was told to try and take it at 8 PM   He has 3 readings after supper and 2 of them were high.  Not clear how long he has been on a better diet  His fasting readings are mildly increased overall with less fluctuation  Has only one reading before supper which was 73  He thinks he gets low sugar symptoms when blood sugars are close to 80 and this may have happened overnight, again he has a difficult time remembering  He has lost a little weight gain probably from a better diet  Oral hypoglycemic drugs: Metformin 1500 mg , Invokana 100  mg daily        Side effects from medications: None          Proper timing of insulin in relation to meals: Yes.         Monitors blood glucose: 1 times a day.    Glucometer: One Touch.          Blood Glucose readings   Mean values apply above for all meters except median for One Touch  PRE-MEAL Fasting Lunch Dinner Overnight  Overall  Glucose range: 113-202   73  71-106    Mean/median:     146    POST-MEAL PC Breakfast PC Lunch PC Dinner  Glucose range:   125-236   Mean/median:       Meals: 3 meals per day. Bfst 8 am Lunch 12 noon Supper at 5-6 pm; breakfast is oatmeal with eggs.  Drinks water mostly and no sweet drinks, Occasionally diet Pepsi            Physical activity: exercise: Sometimes doing recumbent bike       Wt Readings from Last 3 Encounters:  06/02/16 173 lb (78.5 kg)  05/06/16 176 lb (79.8 kg)  04/17/16 176 lb 12.8 oz (80.2 kg)   Lab Results  Component Value Date   HGBA1C 10.3 02/19/2016   HGBA1C 8.3 08/03/2015   HGBA1C 7.4 05/03/2015   Lab Results  Component Value Date  MICROALBUR 1.2 09/26/2014   LDLCALC 86 08/03/2015   CREATININE 1.27 09/13/2015       Medication List       Accurate as of 06/02/16  8:59 AM. Always use your most recent med list.          aspirin 81 MG tablet Take 81 mg by mouth daily.   atenolol 25 MG tablet Commonly known as:  TENORMIN Take 12.5-25 mg by mouth daily. 1 in the morning and one-half at bedtime   azelastine 0.1 % nasal spray Commonly known as:  ASTELIN Place 1 spray into both nostrils 2 (two) times daily. Use in each nostril as directed   canagliflozin 100 MG Tabs tablet Commonly known as:  INVOKANA 1 tablet before breakfast   cholecalciferol 1000 units tablet Commonly known as:  VITAMIN D Take 1,000 Units by mouth daily.   docusate sodium 100 MG capsule Commonly known as:  COLACE Take 100 mg by mouth 2 (two) times daily.   donepezil 5 MG tablet Commonly known as:  ARICEPT TAKE 1 TABLET BY  MOUTH AT BEDTIME   ferrous gluconate 325 MG tablet Commonly known as:  FERGON Take 325 mg by mouth daily with breakfast.   fluticasone 50 MCG/ACT nasal spray Commonly known as:  FLONASE PLACE 2 SPRAYS INTO BOTH NOSTRILS DAILY.   glimepiride 4 MG tablet Commonly known as:  AMARYL TAKE 1 TABLET (4 MG TOTAL) BY MOUTH DAILY.   HUMALOG KWIKPEN 100 UNIT/ML KiwkPen Generic drug:  insulin lispro INJECT 4-5 UNITS THREE TIMES A DAY   insulin lispro 100 UNIT/ML injection Commonly known as:  HUMALOG 56 units daily per V-go pump   Insulin Glargine 300 UNIT/ML Sopn Commonly known as:  TOUJEO SOLOSTAR Inject 15 Units into the skin every morning.   Insulin Pen Needle 31G X 5 MM Misc Commonly known as:  B-D UF III MINI PEN NEEDLES USE 4 TIMES PER DAY   B-D UF III MINI PEN NEEDLES 31G X 5 MM Misc Generic drug:  Insulin Pen Needle USE 4 TIMES PER DAY   metFORMIN 1000 MG tablet Commonly known as:  GLUCOPHAGE Take 1,000 mg by mouth 2 (two) times daily with a meal. Take one tablet in am and 1/2 tablet at bedtime   mometasone 0.1 % ointment Commonly known as:  ELOCON Apply topically daily.   omeprazole 20 MG capsule Commonly known as:  PRILOSEC Take 20 mg by mouth daily.   simvastatin 80 MG tablet Commonly known as:  ZOCOR Take 40 mg by mouth at bedtime.   valACYclovir 1000 MG tablet Commonly known as:  VALTREX Take 1 tablet (1,000 mg total) by mouth 3 (three) times daily.   VITAMIN B-12 IJ Inject 1,000 mcg as directed every 21 ( twenty-one) days.       Allergies:  Allergies  Allergen Reactions  . Avodart [Dutasteride] Swelling  . Ibuprofen Nausea And Vomiting  . Morphine Nausea And Vomiting    Past Medical History:  Diagnosis Date  . B12 deficiency   . BPH (benign prostatic hypertrophy)   . Colon polyps   . Diabetes mellitus   . GERD (gastroesophageal reflux disease)    hiatal hernia  . History of kidney stones   . Hyperlipidemia   . Hypertension   .  Osteoarthritis     Past Surgical History:  Procedure Laterality Date  . APPENDECTOMY    . CARDIOVASCULAR STRESS TEST  09/02/2011   Post stress myocardial perfusion images show normal pattern of perfusion in all  areas. No significant wall abnormalites noted.  Marland Kitchen CAROTID DOPPLER  01/27/2011   Right & Left ICAs 0-49% diameter reduction, right & left subclavian arteries demonstrated less than 50% diameter reduction.  . CYSTOSCOPY WITH BIOPSY  06/17/2012   Procedure: CYSTOSCOPY WITH BIOPSY;  Surgeon: Molli Hazard, MD;  Location: [facility];  Service: Urology;  Laterality: N/A;  . KNEE CARTILAGE SURGERY     Arthroscope  . Partial amputaion left foot    . TRANSTHORACIC ECHOCARDIOGRAM  09/02/2011   EF >55%, normal LV systolic function, mild diastolic dysfunction  . TRANSURETHRAL RESECTION OF PROSTATE  06/17/2012   Procedure: TRANSURETHRAL RESECTION OF THE PROSTATE WITH GYRUS INSTRUMENTS;  Surgeon: Molli Hazard, MD;  Location: [facility];  Service: Urology;  Laterality: N/A;    Family History  Problem Relation Age of Onset  . Heart attack Mother   . Hypertension Mother     Social History:  reports that he has quit smoking. He has never used smokeless tobacco. He reports that he does not drink alcohol or use drugs.  Review of Systems:   HYPERTENSION: Minimal, only on low-dose atenolol This has been followed by PCP  HYPERLIPIDEMIA:   Treated with high-dose simvastatin  with good control, needs follow-up  Lab Results  Component Value Date   CHOL 151 08/03/2015   HDL 50.70 08/03/2015   LDLCALC 86 08/03/2015   TRIG 74.0 08/03/2015   CHOLHDL 3 08/03/2015        Examination:   BP 118/60   Pulse 76   Ht 5\' 9"  (1.753 m)   Wt 173 lb (78.5 kg)   SpO2 98%   BMI 25.55 kg/m   Body mass index is 25.55 kg/m.    ASSESSMENT/ PLAN:    Diabetes type 2 requiring insulin   See history of present illness for detailed discussion of his current management, blood sugar patterns and  problems identified  He appears to be having Somewhat better control both with improving his diet and also better compliance with taking his insulin consistently He is getting some supervision of his diet and insulin regimen due to his dementia  Again checking blood sugars very sporadically and mostly in the mornings and not clear what his blood sugar patterns are. He tends to have low normal sugars overnight occasionally and this may be based on his diet at the evening meal   Recommendations   Showed him again how to keep a record of his insulin doses each time he takes it, have entered his insulin doses in the diary  He will take 18 units of Toujeo instead of  20 and continue taking this at 8 PM or so  Advised him not to change the dose of Toujeo.   he will take a consistent dose of 8 units before each meal of the Humalog for simplicity  Follow-up in 8 weeks  Discussed management with his son also was present  Probably needs a small bedtime snack if eating a lighter meal in the evening to avoid overnight hypoglycemia  Stay on Invokana unchanged  Recheck lipids as he is overdue for follow-up on this along with chemistry panel and microalbumin  He can have labs drawn at his PCP office prior to his next visit for convenience   HYPERTENSION: Well controlled  Patient Instructions  NEW insulin doses:  TOUJEO insulin, this is the once a day insulin that you are taking at 8:00 PM with the other medications:  Reduce the dose to 18 units of  the Toujeo  Humalog 8 units before BREAKFAST and 8 units before LUNCH and SUPPER    Counseling time on subjects discussed above is over 50% of today's 25 minute visit   Jamaya Sleeth 06/02/2016, 8:59 AM

## 2016-06-02 NOTE — Addendum Note (Signed)
Addended by: Nile Riggs on: 06/02/2016 11:06 AM   Modules accepted: Orders

## 2016-06-03 NOTE — Telephone Encounter (Signed)
Error.  I charted on the wrong chart.

## 2016-06-11 ENCOUNTER — Ambulatory Visit (HOSPITAL_COMMUNITY): Payer: Medicare Other | Attending: Cardiology

## 2016-08-04 ENCOUNTER — Ambulatory Visit (INDEPENDENT_AMBULATORY_CARE_PROVIDER_SITE_OTHER): Payer: Medicare Other | Admitting: Endocrinology

## 2016-08-04 ENCOUNTER — Encounter: Payer: Self-pay | Admitting: Endocrinology

## 2016-08-04 ENCOUNTER — Other Ambulatory Visit: Payer: Self-pay

## 2016-08-04 VITALS — BP 108/58 | HR 69 | Ht 69.0 in | Wt 173.0 lb

## 2016-08-04 DIAGNOSIS — Z794 Long term (current) use of insulin: Secondary | ICD-10-CM | POA: Diagnosis not present

## 2016-08-04 DIAGNOSIS — E1165 Type 2 diabetes mellitus with hyperglycemia: Secondary | ICD-10-CM | POA: Diagnosis not present

## 2016-08-04 MED ORDER — INSULIN ASPART PROT & ASPART (70-30 MIX) 100 UNIT/ML PEN: PEN_INJECTOR | SUBCUTANEOUS | 1 refills | Status: DC

## 2016-08-04 NOTE — Addendum Note (Signed)
Addended by: Elayne Snare on: 08/04/2016 09:26 PM   Modules accepted: Orders

## 2016-08-04 NOTE — Progress Notes (Signed)
Patient ID: [name], male   DOB: April 09, 1929, 83 y.o.   MRN: [id number]   Reason for Appointment: Diabetes follow-up   History of Present Illness   Diagnosis: Type 2 DIABETES MELITUS, diagnoses 1972   He has had long-standing diabetes and has been on basal bolus insulin regimen after failure of oral hypoglycemic drugs over the last several years His blood sugars have been generally fairly well controlled but has a tendency to high postprandial readings Previously required only a small dose of 5 units of Levemir insulin  Taking Amaryl in addition to his insulin regimen  his blood sugars had improved his blood sugars during the daytime  He had taken Toujeo since 9/16 instead of twice a day Levemir  RECENT history:   Insulin regimen:  Toujeo 18 units at bedtime   Humalog 05-04-09 a.c    A1c on the last visit was 8%  Current management, blood sugar patterns and problems identified  HeHas checked his blood sugars only sporadically and mostly in the morning  Although his son is trying to help him be more compliant with various aspects of his diabetes care he is not able to follow instructions.  His FASTING blood sugars are mostly high.  Surprisingly has had a couple of low normal or low sugars during the night and not clear if he is mixing up his Toujeo and Humalog insulin at bedtime by mistake.  His Toujeo was reduced on the last visit because of potential for hypoglycemia overnight  He also has brought a high readings midday but also relatively low once.  Has not checked readings after supper.  Most of his blood sugars are high and today he probably did not take his HUMALOG at breakfast causing the blood sugar to be about 280  He has done a little exercise on his exercise bike and his weight is about the same  He has difficulty getting his Invokana refilled the last couple of days  Asking about getting prescription for test strips to be obtained from a  different source including [facility]  Oral hypoglycemic drugs: Metformin 1500 mg , Invokana 100 mg daily        Side effects from medications: None          Proper timing of insulin in relation to meals: Yes.         Monitors blood glucose: <1 times a day.    Glucometer: One Touch.          Blood Glucose readings   Mean values apply above for all meters except median for One Touch  PRE-MEAL Fasting Lunch Dinner Overnight  Overall  Glucose range: 140-225 60-2 95  187, 269  64, 99    Mean/median:     187   POST-MEAL PC Breakfast PC Lunch PC Dinner  Glucose range:   284   Mean/median:        Meals: 3 meals per day. Bfst 8 am Lunch 12 noon Supper at 5-6 pm; breakfast is oatmeal with eggs.  Drinks water mostly and no sweet drinks, Occasionally diet Pepsi            Physical activity: exercise: Sometimes doing recumbent bike       Wt Readings from Last 3 Encounters:  08/04/16 173 lb (78.5 kg)  06/02/16 173 lb (78.5 kg)  05/06/16 176 lb (79.8 kg)   Lab Results  Component Value Date   HGBA1C 8.0 06/02/2016   HGBA1C 10.3 02/19/2016  HGBA1C 8.3 08/03/2015   Lab Results  Component Value Date   MICROALBUR 1.0 06/02/2016   LDLCALC 76 06/02/2016   CREATININE 1.27 06/02/2016     Allergies as of 08/04/2016      Reactions   Avodart [dutasteride] Swelling   Ibuprofen Nausea And Vomiting   Morphine Nausea And Vomiting      Medication List       Accurate as of 08/04/16  9:17 PM. Always use your most recent med list.          aspirin 81 MG tablet Take 81 mg by mouth daily.   atenolol 25 MG tablet Commonly known as:  TENORMIN Take 12.5-25 mg by mouth daily. 1 in the morning and one-half at bedtime   azelastine 0.1 % nasal spray Commonly known as:  ASTELIN Place 1 spray into both nostrils 2 (two) times daily. Use in each nostril as directed   canagliflozin 100 MG Tabs tablet Commonly known as:  INVOKANA 1 tablet before breakfast   cholecalciferol 1000 units  tablet Commonly known as:  VITAMIN D Take 1,000 Units by mouth daily.   docusate sodium 100 MG capsule Commonly known as:  COLACE Take 100 mg by mouth 2 (two) times daily.   donepezil 5 MG tablet Commonly known as:  ARICEPT TAKE 1 TABLET BY MOUTH AT BEDTIME   ferrous gluconate 325 MG tablet Commonly known as:  FERGON Take 325 mg by mouth daily with breakfast.   fluticasone 50 MCG/ACT nasal spray Commonly known as:  FLONASE PLACE 2 SPRAYS INTO BOTH NOSTRILS DAILY.   glimepiride 4 MG tablet Commonly known as:  AMARYL TAKE 1 TABLET (4 MG TOTAL) BY MOUTH DAILY.   HUMALOG KWIKPEN 100 UNIT/ML KiwkPen Generic drug:  insulin lispro INJECT 4-5 UNITS THREE TIMES A DAY   insulin lispro 100 UNIT/ML injection Commonly known as:  HUMALOG 56 units daily per V-go pump   Insulin Glargine 300 UNIT/ML Sopn Commonly known as:  TOUJEO SOLOSTAR Inject 15 Units into the skin every morning.   Insulin Pen Needle 31G X 5 MM Misc Commonly known as:  B-D UF III MINI PEN NEEDLES USE 4 TIMES PER DAY   B-D UF III MINI PEN NEEDLES 31G X 5 MM Misc Generic drug:  Insulin Pen Needle USE 4 TIMES PER DAY   metFORMIN 1000 MG tablet Commonly known as:  GLUCOPHAGE Take 1,000 mg by mouth 2 (two) times daily with a meal. Take one tablet in am and 1/2 tablet at bedtime   mometasone 0.1 % ointment Commonly known as:  ELOCON Apply topically daily.   omeprazole 20 MG capsule Commonly known as:  PRILOSEC Take 20 mg by mouth daily.   simvastatin 80 MG tablet Commonly known as:  ZOCOR Take 40 mg by mouth at bedtime.   valACYclovir 1000 MG tablet Commonly known as:  VALTREX Take 1 tablet (1,000 mg total) by mouth 3 (three) times daily.   VITAMIN B-12 IJ Inject 1,000 mcg as directed every 21 ( twenty-one) days.       Allergies:  Allergies  Allergen Reactions  . Avodart [Dutasteride] Swelling  . Ibuprofen Nausea And Vomiting  . Morphine Nausea And Vomiting    Past Medical History:    Diagnosis Date  . B12 deficiency   . BPH (benign prostatic hypertrophy)   . Colon polyps   . Diabetes mellitus   . GERD (gastroesophageal reflux disease)    hiatal hernia  . History of kidney stones   . Hyperlipidemia   .  Hypertension   . Osteoarthritis     Past Surgical History:  Procedure Laterality Date  . APPENDECTOMY    . CARDIOVASCULAR STRESS TEST  09/02/2011   Post stress myocardial perfusion images show normal pattern of perfusion in all areas. No significant wall abnormalites noted.  Marland Kitchen CAROTID DOPPLER  01/27/2011   Right & Left ICAs 0-49% diameter reduction, right & left subclavian arteries demonstrated less than 50% diameter reduction.  . CYSTOSCOPY WITH BIOPSY  06/17/2012   Procedure: CYSTOSCOPY WITH BIOPSY;  Surgeon: Molli Hazard, MD;  Location: [facility];  Service: Urology;  Laterality: N/A;  . KNEE CARTILAGE SURGERY     Arthroscope  . Partial amputaion left foot    . TRANSTHORACIC ECHOCARDIOGRAM  09/02/2011   EF >55%, normal LV systolic function, mild diastolic dysfunction  . TRANSURETHRAL RESECTION OF PROSTATE  06/17/2012   Procedure: TRANSURETHRAL RESECTION OF THE PROSTATE WITH GYRUS INSTRUMENTS;  Surgeon: Molli Hazard, MD;  Location: [facility];  Service: Urology;  Laterality: N/A;    Family History  Problem Relation Age of Onset  . Heart attack Mother   . Hypertension Mother     Social History:  reports that he has quit smoking. He has never used smokeless tobacco. He reports that he does not drink alcohol or use drugs.  Review of Systems:  HYPERTENSION: Minimal, only on low-dose atenolol This has been followed by PCP  HYPERLIPIDEMIA:   Treated with high-dose simvastatin  with good control   Lab Results  Component Value Date   CHOL 147 06/02/2016   HDL 57.50 06/02/2016   LDLCALC 76 06/02/2016   TRIG 66.0 06/02/2016   CHOLHDL 3 06/02/2016   Last foot exam: 04/2016     Examination:   BP (!) 108/58   Pulse 69   Ht 5\' 9"  (1.753 m)   Wt  173 lb (78.5 kg)   SpO2 96%   BMI 25.55 kg/m   Body mass index is 25.55 kg/m.    ASSESSMENT/ PLAN:    Diabetes type 2 requiring insulin   See history of present illness for detailed discussion of his current management, blood sugar patterns and problems identified  His blood sugars are overall higher compared to the last visit. He is probably not getting good compliance with his insulin. He is not keeping a diary of his insulin doses as recommended several times. He is possible that he may have taken Humalog at bedtime instead of Toujeo occasionally to cause low sugars during the night Currently with a low dose Toujeo his fasting readings are mostly high. Also again requiring relatively more mealtime insulin and basal insulin   Recommendations   Indicated in writing in his diary how to keep a record of his insulin doses at various meals.  He does not need to keep a record of his blood sugars in the diary.  Although he may not have been given controlled with premixed insulin for simplicity will do better with twice a day premixed insulin instead of various basal bolus insulin regimen.  Most likely has dementia may not improve and he will have further difficulties with compliance and mixing up his insulin regimen with potential hypoglycemia also.  He is also not checking his blood sugars enough and needs to be reminded to do more readings, his son may help him with this  To start with 24 units in the morning and 18 at suppertime.  His son will call in 2 weeks to report his blood sugars and also  if he has low sugars.  He can get his test strips from the [facility]  Toujeo and Humalog  Patient Instructions  Check blood sugars on waking up  3x per week  Also check blood sugars about 2 hours after a meal and do this after different meals by rotation  Recommended blood sugar levels on waking up is 90-130 and about 2 hours after meal is 130-160  Please bring your blood  sugar monitor to each visit, thank you  Novolog mix insulin 24 units in am and 18 at supper daily  MORE TESTING AT BEDTIME  Call readings in 2 weeks    Counseling time on subjects discussed above is over 50% of today's 25 minute visit   Lasandra Batley 08/04/2016, 9:17 PM

## 2016-08-04 NOTE — Patient Instructions (Addendum)
Check blood sugars on waking up  3x per week  Also check blood sugars about 2 hours after a meal and do this after different meals by rotation  Recommended blood sugar levels on waking up is 90-130 and about 2 hours after meal is 130-160  Please bring your blood sugar monitor to each visit, thank you  Novolog mix insulin 24 units in am and 18 at supper daily  MORE TESTING AT BEDTIME  Call readings in 2 weeks

## 2016-08-05 ENCOUNTER — Other Ambulatory Visit: Payer: Self-pay

## 2016-08-05 ENCOUNTER — Telehealth: Payer: Self-pay

## 2016-08-05 NOTE — Telephone Encounter (Signed)
Switch to Humalog mix 75/25 KwikPen same doses

## 2016-08-05 NOTE — Telephone Encounter (Signed)
Spoke with Adekunle Meece which is the patient's son and he stated that dads insurance will not cover the 70/30 but will cover 75/25 will thi be ok please advise

## 2016-08-07 ENCOUNTER — Telehealth: Payer: Self-pay | Admitting: Endocrinology

## 2016-08-07 ENCOUNTER — Other Ambulatory Visit: Payer: Self-pay

## 2016-08-07 MED ORDER — INSULIN LISPRO PROT & LISPRO (75-25 MIX) 100 UNIT/ML KWIKPEN: PEN_INJECTOR | SUBCUTANEOUS | 11 refills | Status: DC

## 2016-08-07 NOTE — Telephone Encounter (Signed)
[facility] on [address] called and said that instead of the Novolog that was called in, the Humulog Mix 75/25 is the one that the insurance will cover at a $0 copay.  They Need a new prescription.

## 2016-08-07 NOTE — Telephone Encounter (Signed)
Pt's son called in about the new insulin that he said still has not been called into his pharmacy.  He requests to speak with Lattie Haw.

## 2016-08-07 NOTE — Telephone Encounter (Signed)
Ordered 08/07/2016

## 2016-08-11 NOTE — Telephone Encounter (Signed)
Refill submitted 08/07/2016.

## 2016-08-12 ENCOUNTER — Ambulatory Visit (INDEPENDENT_AMBULATORY_CARE_PROVIDER_SITE_OTHER): Payer: Medicare Other | Admitting: Podiatry

## 2016-08-12 ENCOUNTER — Encounter: Payer: Self-pay | Admitting: Podiatry

## 2016-08-12 VITALS — Ht 69.0 in | Wt 173.0 lb

## 2016-08-12 DIAGNOSIS — E1140 Type 2 diabetes mellitus with diabetic neuropathy, unspecified: Secondary | ICD-10-CM

## 2016-08-12 DIAGNOSIS — M79609 Pain in unspecified limb: Secondary | ICD-10-CM

## 2016-08-12 DIAGNOSIS — B351 Tinea unguium: Secondary | ICD-10-CM

## 2016-08-12 NOTE — Progress Notes (Signed)
Patient ID: [name], male   DOB: 08/28/1928, 83 y.o.   MRN: [id number] Complaint:  Visit Type: Patient returns to my office for continued preventative foot care services. Complaint: Patient states" my nails have grown long and thick and become painful to walk and wear shoes" Patient has been diagnosed with DM with no foot complications. The patient presents for preventative foot care services. No changes to ROS.  He has history 1,2 toes left foot secondary to trauma.  Podiatric Exam: Vascular: dorsalis pedis and posterior tibial pulses are palpable bilateral. Capillary return is immediate. Temperature gradient is WNL. Skin turgor WNL  Sensorium: Normal Semmes Weinstein monofilament test. Normal tactile sensation bilaterally. Nail Exam: Pt has thick disfigured discolored nails with subungual debris noted bilateral entire nail hallux through fifth toenails Ulcer Exam: There is no evidence of ulcer or pre-ulcerative changes or infection. Orthopedic Exam: Muscle tone and strength are WNL. No limitations in general ROM. No crepitus or effusions noted. Foot type and digits show no abnormalities. Bony prominences are unremarkable. Skin:  Porokeratosis   plantar aspect left hallux. . No infection or ulcers  Diagnosis:  Onychomycosis, , Pain in right toe, pain in left toes,  Porokeratosis left foot.  Treatment & Plan Procedures and Treatment: Consent by patient was obtained for treatment procedures. The patient understood the discussion of treatment and procedures well. All questions were answered thoroughly reviewed. Debridement of mycotic and hypertrophic toenails, 1 through 5 bilateral and clearing of subungual debris. No ulceration, no infection noted.  Return Visit-Office Procedure: Patient instructed to return to the office for a follow up visit 3 months for continued evaluation and treatment.    Naim Murtha DPM 

## 2016-08-18 ENCOUNTER — Other Ambulatory Visit: Payer: Self-pay

## 2016-08-25 ENCOUNTER — Other Ambulatory Visit: Payer: Self-pay | Admitting: Endocrinology

## 2016-08-28 ENCOUNTER — Telehealth: Payer: Self-pay | Admitting: Endocrinology

## 2016-08-28 DIAGNOSIS — E161 Other hypoglycemia: Secondary | ICD-10-CM | POA: Diagnosis not present

## 2016-08-28 DIAGNOSIS — R7309 Other abnormal glucose: Secondary | ICD-10-CM | POA: Diagnosis not present

## 2016-08-28 NOTE — Telephone Encounter (Signed)
The son believes it is due to not eating a lot and that could be causing issues, could be something wrong with his meter, his lows are frequent and on random times.   Son is aware of the reduction request but is afraid to do that.  He has woken up 4-5 times since the new dosing with lows in the middle of the night, there are no readings to go by right now.

## 2016-08-28 NOTE — Telephone Encounter (Signed)
Patient son stated dads b/s has been running really low, last night had to call EMS b/s was in the 40tiesp please advise on what to do.

## 2016-08-28 NOTE — Telephone Encounter (Signed)
Left a message on the son's voicemail to indicate that he does need to reduce the dose by 6 units in the evening because of tendency to low sugars overnight and this is not safe for him

## 2016-08-28 NOTE — Telephone Encounter (Signed)
Please find out what his blood sugars are and what time the sugars are going low Reduce the evening insulin dose from 18 down to 12 units.  Please call him today

## 2016-08-29 NOTE — Telephone Encounter (Signed)
Pt's son states his father did reduce the regimen at night to 12 u last night and he did still have a low in the 30s  Son has also realized that the pt's meter is broken and is reading 20 points lower than the one that was used by EMT last night

## 2016-09-01 ENCOUNTER — Telehealth: Payer: Self-pay

## 2016-09-01 NOTE — Telephone Encounter (Signed)
He can have the One Touch Verio monitor.  Also did you confirm that he is taking 8 units before supper on the evening insulin?

## 2016-09-01 NOTE — Telephone Encounter (Signed)
LVM requesting a call back.

## 2016-09-01 NOTE — Telephone Encounter (Signed)
Spoke with son and explained the note- he also stated the meter is not working the EMS got reading of 53 and Mr. Chrismons meter was saying 3 so he feels that may be part of the problem he would like to get a new meter says he could come tomorrow and pick one up he will be in the area please advise

## 2016-09-01 NOTE — Telephone Encounter (Signed)
Patient was contacted on Friday evening, he sometimes takes his evening insulin after eating He was told to make sure he takes the insulin before eating and take 8 units before supper Please check with his son to see if he is having any more low sugars

## 2016-09-01 NOTE — Telephone Encounter (Signed)
Yes he is taking the 8 units before supper and seems to be working

## 2016-09-05 ENCOUNTER — Other Ambulatory Visit: Payer: Medicare Other

## 2016-09-05 DIAGNOSIS — N182 Chronic kidney disease, stage 2 (mild): Secondary | ICD-10-CM

## 2016-09-05 DIAGNOSIS — IMO0002 Reserved for concepts with insufficient information to code with codable children: Secondary | ICD-10-CM

## 2016-09-05 DIAGNOSIS — Z79899 Other long term (current) drug therapy: Secondary | ICD-10-CM | POA: Diagnosis not present

## 2016-09-05 DIAGNOSIS — I10 Essential (primary) hypertension: Secondary | ICD-10-CM

## 2016-09-05 DIAGNOSIS — E785 Hyperlipidemia, unspecified: Secondary | ICD-10-CM

## 2016-09-05 DIAGNOSIS — Z794 Long term (current) use of insulin: Secondary | ICD-10-CM

## 2016-09-05 DIAGNOSIS — D519 Vitamin B12 deficiency anemia, unspecified: Secondary | ICD-10-CM | POA: Diagnosis not present

## 2016-09-05 DIAGNOSIS — E1165 Type 2 diabetes mellitus with hyperglycemia: Secondary | ICD-10-CM

## 2016-09-05 DIAGNOSIS — N400 Enlarged prostate without lower urinary tract symptoms: Secondary | ICD-10-CM | POA: Diagnosis not present

## 2016-09-05 DIAGNOSIS — E1122 Type 2 diabetes mellitus with diabetic chronic kidney disease: Secondary | ICD-10-CM | POA: Diagnosis not present

## 2016-09-05 DIAGNOSIS — Z0000 Encounter for general adult medical examination without abnormal findings: Secondary | ICD-10-CM

## 2016-09-05 LAB — VITAMIN B12: VITAMIN B 12: 249 pg/mL (ref 200–1100)

## 2016-09-05 LAB — TSH: TSH: 2.29 mIU/L (ref 0.40–4.50)

## 2016-09-05 LAB — CBC WITH DIFFERENTIAL/PLATELET
BASOS ABS: 77 {cells}/uL (ref 0–200)
BASOS PCT: 1 %
EOS ABS: 154 {cells}/uL (ref 15–500)
EOS PCT: 2 %
HCT: 38.3 % — ABNORMAL LOW (ref 38.5–50.0)
HEMOGLOBIN: 12.6 g/dL — AB (ref 13.0–17.0)
LYMPHS ABS: 1155 {cells}/uL (ref 850–3900)
Lymphocytes Relative: 15 %
MCH: 32.8 pg (ref 27.0–33.0)
MCHC: 32.9 g/dL (ref 32.0–36.0)
MCV: 99.7 fL (ref 80.0–100.0)
MONOS PCT: 7 %
MPV: 10.4 fL (ref 7.5–12.5)
Monocytes Absolute: 539 cells/uL (ref 200–950)
NEUTROS ABS: 5775 {cells}/uL (ref 1500–7800)
Neutrophils Relative %: 75 %
PLATELETS: 198 10*3/uL (ref 140–400)
RBC: 3.84 MIL/uL — ABNORMAL LOW (ref 4.20–5.80)
RDW: 13.5 % (ref 11.0–15.0)
WBC: 7.7 10*3/uL (ref 3.8–10.8)

## 2016-09-05 LAB — COMPLETE METABOLIC PANEL WITH GFR
ALBUMIN: 4.2 g/dL (ref 3.6–5.1)
ALK PHOS: 69 U/L (ref 40–115)
ALT: 16 U/L (ref 9–46)
AST: 19 U/L (ref 10–35)
BILIRUBIN TOTAL: 0.7 mg/dL (ref 0.2–1.2)
BUN: 32 mg/dL — ABNORMAL HIGH (ref 7–25)
CO2: 27 mmol/L (ref 20–31)
CREATININE: 1.18 mg/dL — AB (ref 0.70–1.11)
Calcium: 9.5 mg/dL (ref 8.6–10.3)
Chloride: 105 mmol/L (ref 98–110)
GFR, EST AFRICAN AMERICAN: 65 mL/min (ref >=60)
GFR, EST NON AFRICAN AMERICAN: 56 mL/min — AB (ref >=60)
Glucose, Bld: 142 mg/dL — ABNORMAL HIGH (ref 70–99)
Potassium: 4.4 mmol/L (ref 3.5–5.3)
Sodium: 142 mmol/L (ref 135–146)
TOTAL PROTEIN: 6.8 g/dL (ref 6.1–8.1)

## 2016-09-05 LAB — PSA: PSA: 1.1 ng/mL (ref <=4.0)

## 2016-09-05 LAB — LIPID PANEL
CHOLESTEROL: 140 mg/dL (ref <200)
HDL: 60 mg/dL (ref >40)
LDL CALC: 67 mg/dL (ref <100)
TRIGLYCERIDES: 65 mg/dL (ref <150)
Total CHOL/HDL Ratio: 2.3 Ratio (ref <5.0)
VLDL: 13 mg/dL (ref <30)

## 2016-09-06 LAB — HEMOGLOBIN A1C
HEMOGLOBIN A1C: 7.3 % — AB (ref <5.7)
Mean Plasma Glucose: 163 mg/dL

## 2016-09-09 ENCOUNTER — Ambulatory Visit (INDEPENDENT_AMBULATORY_CARE_PROVIDER_SITE_OTHER): Payer: Medicare Other | Admitting: Family Medicine

## 2016-09-09 ENCOUNTER — Encounter: Payer: Self-pay | Admitting: Family Medicine

## 2016-09-09 VITALS — BP 122/58 | HR 78 | Temp 97.6°F | Resp 14 | Ht 69.0 in | Wt 181.0 lb

## 2016-09-09 DIAGNOSIS — Z8673 Personal history of transient ischemic attack (TIA), and cerebral infarction without residual deficits: Secondary | ICD-10-CM

## 2016-09-09 DIAGNOSIS — Z0000 Encounter for general adult medical examination without abnormal findings: Secondary | ICD-10-CM

## 2016-09-09 DIAGNOSIS — D510 Vitamin B12 deficiency anemia due to intrinsic factor deficiency: Secondary | ICD-10-CM | POA: Diagnosis not present

## 2016-09-09 DIAGNOSIS — R413 Other amnesia: Secondary | ICD-10-CM

## 2016-09-09 NOTE — Progress Notes (Signed)
Subjective:    Patient ID: [name], male    DOB: 1928/10/23, 83 y.o.   MRN: [id number]  HPI Patient is an 83 year old white male here today for a complete physical exam. His last colonoscopy was in 2010.  However he has dementia. Although mild, coupled with his advanced age, I do not believe a repeat colonoscopy is medically prudent. Likewise I do not support prostate cancer screening for this patient. His most recent immunizations are listed below Immunization History  Administered Date(s) Administered  . Influenza,inj,Quad PF,36+ Mos 05/03/2013, 05/25/2014, 04/26/2015  . Influenza-Unspecified 06/06/1999, 05/19/2000, 05/01/2001, 05/25/2002, 05/12/2003, 05/21/2004, 05/12/2005, 05/15/2006, 05/14/2007, 05/18/2008, 05/03/2009, 04/23/2010, 05/20/2011, 05/12/2012, 04/27/2016  . Pneumococcal Conjugate-13 01/31/2015  . Pneumococcal Polysaccharide-23 04/23/1999  . Td 03/26/1998, 06/01/2007   Lab on 09/05/2016  Component Date Value Ref Range Status  . Sodium 09/05/2016 142  135 - 146 mmol/L Final  . Potassium 09/05/2016 4.4  3.5 - 5.3 mmol/L Final  . Chloride 09/05/2016 105  98 - 110 mmol/L Final  . CO2 09/05/2016 27  20 - 31 mmol/L Final  . Glucose, Bld 09/05/2016 142* 70 - 99 mg/dL Final  . BUN 09/05/2016 32* 7 - 25 mg/dL Final  . Creat 09/05/2016 1.18* 0.70 - 1.11 mg/dL Final   Comment:   For patients > or = 82 years of age: The upper reference limit for Creatinine is approximately 13% higher for people identified as African-American.     . Total Bilirubin 09/05/2016 0.7  0.2 - 1.2 mg/dL Final  . Alkaline Phosphatase 09/05/2016 69  40 - 115 U/L Final  . AST 09/05/2016 19  10 - 35 U/L Final  . ALT 09/05/2016 16  9 - 46 U/L Final  . Total Protein 09/05/2016 6.8  6.1 - 8.1 g/dL Final  . Albumin 09/05/2016 4.2  3.6 - 5.1 g/dL Final  . Calcium 09/05/2016 9.5  8.6 - 10.3 mg/dL Final  . GFR, Est African American 09/05/2016 65  >=60 mL/min Final  . GFR, Est Non African American  09/05/2016 56* >=60 mL/min Final  . TSH 09/05/2016 2.29  0.40 - 4.50 mIU/L Final  . Cholesterol 09/05/2016 140  <200 mg/dL Final  . Triglycerides 09/05/2016 65  <150 mg/dL Final  . HDL 09/05/2016 60  >40 mg/dL Final  . Total CHOL/HDL Ratio 09/05/2016 2.3  <5.0 Ratio Final  . VLDL 09/05/2016 13  <30 mg/dL Final  . LDL Cholesterol 09/05/2016 67  <100 mg/dL Final  . WBC 09/05/2016 7.7  3.8 - 10.8 K/uL Final  . RBC 09/05/2016 3.84* 4.20 - 5.80 MIL/uL Final  . Hemoglobin 09/05/2016 12.6* 13.0 - 17.0 g/dL Final  . HCT 09/05/2016 38.3* 38.5 - 50.0 % Final  . MCV 09/05/2016 99.7  80.0 - 100.0 fL Final  . MCH 09/05/2016 32.8  27.0 - 33.0 pg Final  . MCHC 09/05/2016 32.9  32.0 - 36.0 g/dL Final  . RDW 09/05/2016 13.5  11.0 - 15.0 % Final  . Platelets 09/05/2016 198  140 - 400 K/uL Final  . MPV 09/05/2016 10.4  7.5 - 12.5 fL Final  . Neutro Abs 09/05/2016 5775  1,500 - 7,800 cells/uL Final  . Lymphs Abs 09/05/2016 1155  850 - 3,900 cells/uL Final  . Monocytes Absolute 09/05/2016 539  200 - 950 cells/uL Final  . Eosinophils Absolute 09/05/2016 154  15 - 500 cells/uL Final  . Basophils Absolute 09/05/2016 77  0 - 200 cells/uL Final  . Neutrophils Relative % 09/05/2016 75  % Final  .  Lymphocytes Relative 09/05/2016 15  % Final  . Monocytes Relative 09/05/2016 7  % Final  . Eosinophils Relative 09/05/2016 2  % Final  . Basophils Relative 09/05/2016 1  % Final  . Smear Review 09/05/2016 Criteria for review not met   Final  . Hgb A1c MFr Bld 09/05/2016 7.3* <5.7 % Final   Comment:   For someone without known diabetes, a hemoglobin A1c value of 6.5% or greater indicates that they may have diabetes and this should be confirmed with a follow-up test.   For someone with known diabetes, a value <7% indicates that their diabetes is well controlled and a value greater than or equal to 7% indicates suboptimal control. A1c targets should be individualized based on duration of diabetes, age, comorbid  conditions, and other considerations.   Currently, no consensus exists for use of hemoglobin A1c for diagnosis of diabetes for children.     . Mean Plasma Glucose 09/05/2016 163  mg/dL Final  . PSA 09/05/2016 1.1  <=4.0 ng/mL Final   Comment:   The total PSA value from this assay system is standardized against the WHO standard. The test result will be approximately 20% lower when compared to the equimolar-standardized total PSA (Beck[employer] Coul[employer]). Comparison of serial PSA results should be interpreted with this fact in mind.   This test was performed using the Siemens chemiluminescent method. Values obtained from different assay methods cannot be used interchangeably. PSA levels, regardless of value, should not be interpreted as absolute evidence of the presence or absence of disease.     . Vitamin B-12 09/05/2016 249  200 - 1,100 pg/mL Final    Past Medical History:  Diagnosis Date  . B12 deficiency   . BPH (benign prostatic hypertrophy)   . Colon polyps   . Diabetes mellitus   . GERD (gastroesophageal reflux disease)    hiatal hernia  . History of kidney stones   . Hyperlipidemia   . Hypertension   . Osteoarthritis    Past Surgical History:  Procedure Laterality Date  . APPENDECTOMY    . CARDIOVASCULAR STRESS TEST  09/02/2011   Post stress myocardial perfusion images show normal pattern of perfusion in all areas. No significant wall abnormalites noted.  Marland Kitchen CAROTID DOPPLER  01/27/2011   Right & Left ICAs 0-49% diameter reduction, right & left subclavian arteries demonstrated less than 50% diameter reduction.  . CYSTOSCOPY WITH BIOPSY  06/17/2012   Procedure: CYSTOSCOPY WITH BIOPSY;  Surgeon: Molli Hazard, MD;  Location: [facility];  Service: Urology;  Laterality: N/A;  . KNEE CARTILAGE SURGERY     Arthroscope  . Partial amputaion left foot    . TRANSTHORACIC ECHOCARDIOGRAM  09/02/2011   EF >55%, normal LV systolic function, mild diastolic dysfunction  .  TRANSURETHRAL RESECTION OF PROSTATE  06/17/2012   Procedure: TRANSURETHRAL RESECTION OF THE PROSTATE WITH GYRUS INSTRUMENTS;  Surgeon: Molli Hazard, MD;  Location: [facility];  Service: Urology;  Laterality: N/A;   Current Outpatient Prescriptions on File Prior to Visit  Medication Sig Dispense Refill  . aspirin 81 MG tablet Take 81 mg by mouth daily.    Marland Kitchen atenolol (TENORMIN) 25 MG tablet Take 12.5-25 mg by mouth daily. 1 in the morning and one-half at bedtime    . azelastine (ASTELIN) 0.1 % nasal spray Place 1 spray into both nostrils 2 (two) times daily. Use in each nostril as directed 30 mL 3  . B-D UF III MINI PEN NEEDLES 31G X 5 MM MISC  USE 4 TIMES PER DAY 100 each 2  . canagliflozin (INVOKANA) 100 MG TABS tablet 1 tablet before breakfast 90 tablet 1  . cholecalciferol (VITAMIN D) 1000 UNITS tablet Take 1,000 Units by mouth daily.    . Cyanocobalamin (VITAMIN B-12 IJ) Inject 1,000 mcg as directed every 21 ( twenty-one) days.      Marland Kitchen docusate sodium (COLACE) 100 MG capsule Take 100 mg by mouth 2 (two) times daily.    Marland Kitchen donepezil (ARICEPT) 5 MG tablet TAKE 1 TABLET BY MOUTH AT BEDTIME 30 tablet 11  . ferrous gluconate (FERGON) 325 MG tablet Take 325 mg by mouth daily with breakfast.    . fluticasone (FLONASE) 50 MCG/ACT nasal spray PLACE 2 SPRAYS INTO BOTH NOSTRILS DAILY. 48 g 3  . glimepiride (AMARYL) 4 MG tablet TAKE 1 TABLET (4 MG TOTAL) BY MOUTH DAILY. 30 tablet 5  . insulin aspart protamine - aspart (NOVOLOG MIX 70/30 FLEXPEN) (70-30) 100 UNIT/ML FlexPen 24 U acb and 18U aCS 15 mL 1  . Insulin Lispro Prot & Lispro (HUMALOG MIX 75/25 KWIKPEN) (75-25) 100 UNIT/ML Kwikpen Inject 24 units in the morning and 18 units at supper 5 pen 11  . Insulin Pen Needle (B-D UF III MINI PEN NEEDLES) 31G X 5 MM MISC USE 4 TIMES PER DAY 125 each 3  . metFORMIN (GLUCOPHAGE) 1000 MG tablet Take 1,000 mg by mouth 2 (two) times daily with a meal. Take one tablet in am and 1/2 tablet at bedtime    .  mometasone (ELOCON) 0.1 % ointment Apply topically daily. 45 g 0  . omeprazole (PRILOSEC) 20 MG capsule Take 20 mg by mouth daily.      . simvastatin (ZOCOR) 80 MG tablet Take 40 mg by mouth at bedtime.     No current facility-administered medications on file prior to visit.    Allergies  Allergen Reactions  . Avodart [Dutasteride] Swelling  . Ibuprofen Nausea And Vomiting  . Morphine Nausea And Vomiting   Social History   Social History  . Marital status: Married    Spouse name: N/A  . Number of children: N/A  . Years of education: N/A   Occupational History  . Not on file.   Social History Main Topics  . Smoking status: Former Research scientist (life sciences)  . Smokeless tobacco: Never Used  . Alcohol use No  . Drug use: No  . Sexual activity: Not on file     Comment: married to [place], retired.   Other Topics Concern  . Not on file   Social History Narrative  . No narrative on file   Family History  Problem Relation Age of Onset  . Heart attack Mother   . Hypertension Mother       Review of Systems  All other systems reviewed and are negative.      Objective:   Physical Exam  Constitutional: He is oriented to person, place, and time. He appears well-developed and well-nourished. No distress.  HENT:  Head: Normocephalic and atraumatic.  Right Ear: External ear normal.  Left Ear: External ear normal.  Nose: Nose normal.  Mouth/Throat: Oropharynx is clear and moist. No oropharyngeal exudate.  Eyes: Conjunctivae and EOM are normal. Pupils are equal, round, and reactive to light. Right eye exhibits no discharge. Left eye exhibits no discharge. No scleral icterus.  Neck: Normal range of motion. Neck supple. No JVD present. No tracheal deviation present. No thyromegaly present.  Cardiovascular: Normal rate, regular rhythm, normal heart sounds and intact distal  pulses.  Exam reveals no gallop and no friction rub.   No murmur heard. Pulmonary/Chest: Effort normal and breath sounds  normal. No stridor. No respiratory distress. He has no wheezes. He has no rales. He exhibits no tenderness.  Abdominal: Soft. Bowel sounds are normal. He exhibits no distension and no mass. There is no tenderness. There is no rebound and no guarding.  Musculoskeletal: Normal range of motion. He exhibits no edema.  Lymphadenopathy:    He has no cervical adenopathy.  Neurological: He is alert and oriented to person, place, and time. He has normal reflexes. No cranial nerve deficit. He exhibits normal muscle tone. Coordination normal.  Skin: Skin is warm and dry. No rash noted. He is not diaphoretic. No erythema. No pallor.  Psychiatric: He has a normal mood and affect. His behavior is normal. Judgment and thought content normal.  Vitals reviewed.         Assessment & Plan:  General medical exam  Memory loss  Vitamin B12 deficiency anemia due to intrinsic factor deficiency  Status post CVA  The patient's physical exam today is completely normal. His lab work is only significant for borderline low B12. Because of his memory loss, he is failed to receive several B12 injections. I explained this to his son and he is going to work to help bring the patient every month for his B12 injections. His immunizations are up-to-date. He is not a candidate for cancer screening. The lab work looks excellent. I will make no changes in his medication at the present time. He is a high fall risk due to his advanced age and difficulty walking and he does have mild dementia.

## 2016-09-18 ENCOUNTER — Ambulatory Visit (INDEPENDENT_AMBULATORY_CARE_PROVIDER_SITE_OTHER): Payer: Medicare Other | Admitting: Endocrinology

## 2016-09-18 ENCOUNTER — Encounter: Payer: Self-pay | Admitting: Endocrinology

## 2016-09-18 ENCOUNTER — Other Ambulatory Visit: Payer: Self-pay

## 2016-09-18 VITALS — BP 124/66 | HR 55 | Ht 69.0 in | Wt 177.0 lb

## 2016-09-18 DIAGNOSIS — E1165 Type 2 diabetes mellitus with hyperglycemia: Secondary | ICD-10-CM | POA: Diagnosis not present

## 2016-09-18 MED ORDER — ONETOUCH ULTRA 2 W/DEVICE KIT: PACK | 1 refills | Status: DC

## 2016-09-18 NOTE — Patient Instructions (Signed)
Insulin 22 units at BREAKFAST and 5 at SUPPER

## 2016-09-18 NOTE — Progress Notes (Signed)
Patient ID: [name], male   DOB: [birth year], 83 y.o.   MRN: [id number]   Reason for Appointment: Diabetes follow-up   History of Present Illness   Diagnosis: Type 2 DIABETES MELITUS, diagnoses 1972   He has had long-standing diabetes and has been on basal bolus insulin regimen after failure of oral hypoglycemic drugs over the last several years His blood sugars have been generally fairly well controlled but has a tendency to high postprandial readings Previously required only a small dose of 5 units of Levemir insulin  Taking Amaryl in addition to his insulin regimen  his blood sugars had improved his blood sugars during the daytime  He had taken Toujeo since 9/16 instead of twice a day Levemir  RECENT history:   Insulin regimen:  Novolog mix 70/30: 24 units at breakfast, 8 acs  Previous regimen Toujeo 18 units at bedtime   Humalog 05-04-09 a.c    A1c recently was 7.3, previously 8%  Current management, blood sugar patterns and problems identified  He was switched from basal bolus regimen of Toujeo and Humalog to the premixed NovoLog mix insulin in 1/18  Although he was previously taking a total of about 46 units he is now requiring less insulin and is down to about 32 units total  He was starting get low sugars late at night including calls to the EMS but his suppertime dose was reduced progressively  Also he was told to make sure he takes his insulin right at suppertime instead of after meals which he was doing  His son thinks that there was a 15-20 point difference in his blood sugar when he compared his reading to the meter from the EMS and he thinks he needs a new meter  His FASTING blood sugars are significantly improved overall with only a few readings that are about 150 but usually less than 180  Checking blood sugars more sporadically later in the day and mostly late evening and these are recently appeared 8 better  HYPOGLYCEMIA has been minimal in  the last 3 weeks with only one low sugar of 59 around 2 AM, apparently he was not feeling right and checked his blood sugar himself  He is still taking Invokana and Amaryl  Oral hypoglycemic drugs: Metformin 1500 mg , Invokana 100 mg daily        Side effects from medications: None          Proper timing of insulin in relation to meals: Yes.         Monitors blood glucose: <1 times a day.    Glucometer: One Touch.          Blood Glucose readings   Mean values apply above for all meters except median for One Touch  PRE-MEAL Fasting Lunch Dinner Bedtime/ Overnight  Overall  Glucose range: 68-184  43-209  95-217  53-322    Mean/median:     136+/-72      Meals: 3 meals per day. Breakfast at 8 am Lunch 12 noon Supper at 5-6 pm; breakfast is oatmeal with eggs.  Drinks water mostly and no sweet drinks, Occasionally diet Pepsi            Physical activity: exercise: Going out and doing some yardwork, occasionally, bike      Wt Readings from Last 3 Encounters:  09/18/16 177 lb (80.3 kg)  09/09/16 181 lb (82.1 kg)  08/12/16 173 lb (78.5 kg)   Lab Results  Component Value Date  HGBA1C 7.3 (H) 09/05/2016   HGBA1C 8.0 06/02/2016   HGBA1C 10.3 02/19/2016   Lab Results  Component Value Date   MICROALBUR 1.0 06/02/2016   LDLCALC 67 09/05/2016   CREATININE 1.18 (H) 09/05/2016     Allergies as of 09/18/2016      Reactions   Avodart [dutasteride] Swelling   Ibuprofen Nausea And Vomiting   Morphine Nausea And Vomiting      Medication List       Accurate as of 09/18/16 11:59 AM. Always use your most recent med list.          aspirin 81 MG tablet Take 81 mg by mouth daily.   atenolol 25 MG tablet Commonly known as:  TENORMIN Take 12.5-25 mg by mouth daily. 1 in the morning and one-half at bedtime   azelastine 0.1 % nasal spray Commonly known as:  ASTELIN Place 1 spray into both nostrils 2 (two) times daily. Use in each nostril as directed   canagliflozin 100 MG Tabs  tablet Commonly known as:  INVOKANA 1 tablet before breakfast   cholecalciferol 1000 units tablet Commonly known as:  VITAMIN D Take 1,000 Units by mouth daily.   docusate sodium 100 MG capsule Commonly known as:  COLACE Take 100 mg by mouth 2 (two) times daily.   donepezil 5 MG tablet Commonly known as:  ARICEPT TAKE 1 TABLET BY MOUTH AT BEDTIME   ferrous gluconate 325 MG tablet Commonly known as:  FERGON Take 325 mg by mouth daily with breakfast.   fluticasone 50 MCG/ACT nasal spray Commonly known as:  FLONASE PLACE 2 SPRAYS INTO BOTH NOSTRILS DAILY.   glimepiride 4 MG tablet Commonly known as:  AMARYL TAKE 1 TABLET (4 MG TOTAL) BY MOUTH DAILY.   insulin aspart protamine - aspart (70-30) 100 UNIT/ML FlexPen Commonly known as:  NOVOLOG MIX 70/30 FLEXPEN 24 U acb and 18U aCS   Insulin Pen Needle 31G X 5 MM Misc Commonly known as:  B-D UF III MINI PEN NEEDLES USE 4 TIMES PER DAY   B-D UF III MINI PEN NEEDLES 31G X 5 MM Misc Generic drug:  Insulin Pen Needle USE 4 TIMES PER DAY   metFORMIN 1000 MG tablet Commonly known as:  GLUCOPHAGE Take 1,000 mg by mouth 2 (two) times daily with a meal. Take one tablet in am and 1/2 tablet at bedtime   mometasone 0.1 % ointment Commonly known as:  ELOCON Apply topically daily.   omeprazole 20 MG capsule Commonly known as:  PRILOSEC Take 20 mg by mouth daily.   simvastatin 80 MG tablet Commonly known as:  ZOCOR Take 40 mg by mouth at bedtime.   VITAMIN B-12 IJ Inject 1,000 mcg as directed every 21 ( twenty-one) days.       Allergies:  Allergies  Allergen Reactions  . Avodart [Dutasteride] Swelling  . Ibuprofen Nausea And Vomiting  . Morphine Nausea And Vomiting    Past Medical History:  Diagnosis Date  . B12 deficiency   . BPH (benign prostatic hypertrophy)   . Colon polyps   . Diabetes mellitus   . GERD (gastroesophageal reflux disease)    hiatal hernia  . History of kidney stones   . Hyperlipidemia   .  Hypertension   . Osteoarthritis     Past Surgical History:  Procedure Laterality Date  . APPENDECTOMY    . CARDIOVASCULAR STRESS TEST  09/02/2011   Post stress myocardial perfusion images show normal pattern of perfusion in all areas. No  significant wall abnormalites noted.  Marland Kitchen CAROTID DOPPLER  01/27/2011   Right & Left ICAs 0-49% diameter reduction, right & left subclavian arteries demonstrated less than 50% diameter reduction.  . CYSTOSCOPY WITH BIOPSY  06/17/2012   Procedure: CYSTOSCOPY WITH BIOPSY;  Surgeon: Molli Hazard, MD;  Location: [facility];  Service: Urology;  Laterality: N/A;  . KNEE CARTILAGE SURGERY     Arthroscope  . Partial amputaion left foot    . TRANSTHORACIC ECHOCARDIOGRAM  09/02/2011   EF >55%, normal LV systolic function, mild diastolic dysfunction  . TRANSURETHRAL RESECTION OF PROSTATE  06/17/2012   Procedure: TRANSURETHRAL RESECTION OF THE PROSTATE WITH GYRUS INSTRUMENTS;  Surgeon: Molli Hazard, MD;  Location: [facility];  Service: Urology;  Laterality: N/A;    Family History  Problem Relation Age of Onset  . Heart attack Mother   . Hypertension Mother     Social History:  reports that he has quit smoking. He has never used smokeless tobacco. He reports that he does not drink alcohol or use drugs.  Review of Systems:  The following is a copy of the previous note:  HYPERTENSION: Minimal, only on low-dose atenolol This has been followed by PCP  HYPERLIPIDEMIA:   Treated with high-dose simvastatin  with good control   Lab Results  Component Value Date   CHOL 140 09/05/2016   HDL 60 09/05/2016   LDLCALC 67 09/05/2016   TRIG 65 09/05/2016   CHOLHDL 2.3 09/05/2016   Last foot exam: 04/2016     Examination:   BP 124/66   Pulse (!) 55   Ht 5\' 9"  (1.753 m)   Wt 177 lb (80.3 kg)   SpO2 94%   BMI 26.14 kg/m   Body mass index is 26.14 kg/m.    ASSESSMENT/ PLAN:    Diabetes type 2 requiring insulin   See history of present illness for  detailed discussion of his current management, blood sugar patterns and problems identified  He is now doing fairly well with switching to a premixed insulin twice a day which has helped simplify his regimen and improved compliance. Also appears to be requiring much less insulin especially in the evenings A1c is excellent at 7.3 The main difficulty is trying to avoid hypoglycemia and this is somewhat better with reducing evening dose and having him take this more consistently with a meal instead of after   Recommendations   He will use a new One Touch ultra 2 monitor as he wants to use up his strips.  Reduce suppertime dose to 6 units  He can keep his insulin pen on the kitchen counter for her compliance instead of in the refrigerator  More blood sugar testing in the afternoon and evening  Continue having a bedtime snack late at night  Call if having any low blood sugars especially overnight  Again  encouraged him to keep a record of his insulin doses  Balanced meals with some protein at each meal   Patient Instructions  Insulin 22 units at [facility] and 5 at SUPPER   Counseling time on subjects discussed above is over 50% of today's 25 minute visit   Equilla Que 09/18/2016, 11:59 AM

## 2016-09-22 ENCOUNTER — Telehealth: Payer: Self-pay | Admitting: Endocrinology

## 2016-09-22 ENCOUNTER — Other Ambulatory Visit: Payer: Self-pay

## 2016-09-22 MED ORDER — ONETOUCH ULTRA 2 W/DEVICE KIT: PACK | 1 refills | Status: DC

## 2016-09-22 NOTE — Telephone Encounter (Signed)
Spoke to pharmacy and this is resolved

## 2016-09-22 NOTE — Telephone Encounter (Signed)
Pt's son called in stating that a meter was called in but that the pharmacy is needing a dx. Code for the part B insurance and that they are waiting for a response so insurance will pay.  Please contact [facility] on [address] in [place].  Their # is [phone].

## 2016-09-24 ENCOUNTER — Other Ambulatory Visit: Payer: Self-pay

## 2016-09-24 MED ORDER — ONETOUCH ULTRA 2 W/DEVICE KIT: PACK | 1 refills | Status: DC

## 2016-10-12 ENCOUNTER — Other Ambulatory Visit: Payer: Self-pay | Admitting: Endocrinology

## 2016-10-17 ENCOUNTER — Other Ambulatory Visit: Payer: Self-pay | Admitting: Endocrinology

## 2016-11-11 ENCOUNTER — Ambulatory Visit (INDEPENDENT_AMBULATORY_CARE_PROVIDER_SITE_OTHER): Payer: Medicare Other | Admitting: Podiatry

## 2016-11-11 ENCOUNTER — Encounter: Payer: Self-pay | Admitting: Podiatry

## 2016-11-11 DIAGNOSIS — B351 Tinea unguium: Secondary | ICD-10-CM | POA: Diagnosis not present

## 2016-11-11 DIAGNOSIS — M79609 Pain in unspecified limb: Secondary | ICD-10-CM | POA: Diagnosis not present

## 2016-11-11 DIAGNOSIS — E1140 Type 2 diabetes mellitus with diabetic neuropathy, unspecified: Secondary | ICD-10-CM

## 2016-11-11 NOTE — Progress Notes (Signed)
Patient ID: [name], male   DOB: [birth year], 83 y.o.   MRN: [id number] Complaint:  Visit Type: Patient returns to my office for continued preventative foot care services. Complaint: Patient states" my nails have grown long and thick and become painful to walk and wear shoes" Patient has been diagnosed with DM with no foot complications. The patient presents for preventative foot care services. No changes to ROS.  He has history 1,2 toes left foot secondary to trauma.  Podiatric Exam: Vascular: dorsalis pedis and posterior tibial pulses are palpable bilateral. Capillary return is immediate. Temperature gradient is WNL. Skin turgor WNL  Sensorium: Normal Semmes Weinstein monofilament test. Normal tactile sensation bilaterally. Nail Exam: Pt has thick disfigured discolored nails with subungual debris noted bilateral entire nail hallux through fifth toenails Ulcer Exam: There is no evidence of ulcer or pre-ulcerative changes or infection. Orthopedic Exam: Muscle tone and strength are WNL. No limitations in general ROM. No crepitus or effusions noted. Foot type and digits show no abnormalities. Bony prominences are unremarkable. Skin:  Porokeratosis   plantar aspect left hallux. . No infection or ulcers  Diagnosis:  Onychomycosis, , Pain in right toe, pain in left toes,  Porokeratosis left foot.  Treatment & Plan Procedures and Treatment: Consent by patient was obtained for treatment procedures. The patient understood the discussion of treatment and procedures well. All questions were answered thoroughly reviewed. Debridement of mycotic and hypertrophic toenails, 1 through 5 bilateral and clearing of subungual debris. No ulceration, no infection noted.  Return Visit-Office Procedure: Patient instructed to return to the office for a follow up visit 3 months for continued evaluation and treatment.    Gardiner Barefoot DPM

## 2016-11-12 ENCOUNTER — Ambulatory Visit (INDEPENDENT_AMBULATORY_CARE_PROVIDER_SITE_OTHER): Payer: Medicare Other

## 2016-11-12 DIAGNOSIS — E538 Deficiency of other specified B group vitamins: Secondary | ICD-10-CM

## 2016-11-12 MED ORDER — CYANOCOBALAMIN 1000 MCG/ML IJ SOLN
1000.0000 ug | INTRAMUSCULAR | Status: DC
  Administered 2016-11-12 – 2018-09-28 (×4): 1000 ug via INTRAMUSCULAR

## 2016-11-12 NOTE — Progress Notes (Signed)
Patient  was seen in office for a b12  Injection which was given in his left deltoid arm.  Patient  tolerated well   While patient was here he mentioned he had a little sore under his left eye on inside of cheek  Sore appears to be white in the middle a little red around the sore. I asked patient if something bit him patient said no, when asked if it hurts patient said no when asked if it itches patient said no. When asked how long the sore had been there patient said about a week .  Informed patient he could put OTC cream if starts to itch and that  if it gets bigger, or if it starts to  hurt to make an appointment to be seen. Patient verbally stated he understands

## 2016-11-17 ENCOUNTER — Ambulatory Visit: Payer: Medicare Other | Admitting: Endocrinology

## 2016-12-07 ENCOUNTER — Other Ambulatory Visit: Payer: Self-pay | Admitting: Family Medicine

## 2016-12-18 ENCOUNTER — Other Ambulatory Visit: Payer: Self-pay | Admitting: Endocrinology

## 2017-01-29 ENCOUNTER — Ambulatory Visit (INDEPENDENT_AMBULATORY_CARE_PROVIDER_SITE_OTHER): Payer: Medicare Other | Admitting: Family Medicine

## 2017-01-29 DIAGNOSIS — E538 Deficiency of other specified B group vitamins: Secondary | ICD-10-CM | POA: Diagnosis not present

## 2017-01-30 ENCOUNTER — Telehealth: Payer: Self-pay | Admitting: Endocrinology

## 2017-01-30 NOTE — Telephone Encounter (Signed)
Call pharmacy to verify the refill for INVOKANA 100 MG TABS tablet before they are able to fill it.

## 2017-01-30 NOTE — Telephone Encounter (Signed)
Called pharmacy and verified that this is the correct dosage for the Invokana.

## 2017-02-10 ENCOUNTER — Ambulatory Visit: Payer: Medicare Other | Admitting: Podiatry

## 2017-02-11 ENCOUNTER — Other Ambulatory Visit: Payer: Self-pay | Admitting: Endocrinology

## 2017-02-11 ENCOUNTER — Ambulatory Visit: Payer: Medicare Other | Admitting: Podiatry

## 2017-02-17 ENCOUNTER — Ambulatory Visit: Payer: Medicare Other | Admitting: Podiatry

## 2017-02-23 ENCOUNTER — Ambulatory Visit: Payer: Medicare Other | Admitting: Podiatry

## 2017-03-04 ENCOUNTER — Ambulatory Visit (INDEPENDENT_AMBULATORY_CARE_PROVIDER_SITE_OTHER): Payer: Medicare Other | Admitting: Podiatry

## 2017-03-04 ENCOUNTER — Encounter: Payer: Self-pay | Admitting: Podiatry

## 2017-03-04 DIAGNOSIS — L84 Corns and callosities: Secondary | ICD-10-CM

## 2017-03-04 DIAGNOSIS — B351 Tinea unguium: Secondary | ICD-10-CM | POA: Diagnosis not present

## 2017-03-04 DIAGNOSIS — M79676 Pain in unspecified toe(s): Secondary | ICD-10-CM | POA: Diagnosis not present

## 2017-03-04 DIAGNOSIS — E0842 Diabetes mellitus due to underlying condition with diabetic polyneuropathy: Secondary | ICD-10-CM

## 2017-03-07 NOTE — Progress Notes (Signed)
   SUBJECTIVE Patient with a history of diabetes mellitus presents to office today complaining of elongated, thickened nails. Pain while ambulating in shoes. Patient is unable to trim their own nails.   OBJECTIVE General Patient is awake, alert, and oriented x 3 and in no acute distress. Derm hyperkeratotic lesion noted to the distal tuft of the left great toe amputation site. Skin is dry and supple bilateral. Negative open lesions or macerations. Remaining integument unremarkable. Nails are tender, long, thickened and dystrophic with subungual debris, consistent with onychomycosis, 1-5 bilateral. No signs of infection noted. Vasc  DP and PT pedal pulses palpable bilaterally. Temperature gradient within normal limits.  Neuro Epicritic and protective threshold sensation diminished bilaterally.  Musculoskeletal Exam No symptomatic pedal deformities noted bilateral. Muscular strength within normal limits. Healed partial left great toe amputation at approximately the level of the IPJ.  ASSESSMENT 1. Diabetes Mellitus w/ peripheral neuropathy 2. Onychomycosis of nail due to dermatophyte bilateral 3. History of traumatic left great toe partial amputation 4. Pre-ulcerative callus lesion left great toe amputation stump  PLAN OF CARE 1. Patient evaluated today. 2. Instructed to maintain good pedal hygiene and foot care. Stressed importance of controlling blood sugar.  3. Mechanical debridement of nails 1-5 bilaterally performed using a nail nipper. Filed with dremel without incident.  4. Excisional debridement of the pre-ulcerative callus lesion was performed using a chisel blade without incident or bleeding  5. Return to clinic in 3 mos.     Edrick Kins, DPM [facility]  Dr. Edrick Kins, [address]                                        [place], [place] [address]                Office [phone]  Fax [phone]

## 2017-03-10 DIAGNOSIS — H04411 Chronic dacryocystitis of right lacrimal passage: Secondary | ICD-10-CM | POA: Diagnosis not present

## 2017-03-10 DIAGNOSIS — E119 Type 2 diabetes mellitus without complications: Secondary | ICD-10-CM | POA: Diagnosis not present

## 2017-03-10 DIAGNOSIS — H40013 Open angle with borderline findings, low risk, bilateral: Secondary | ICD-10-CM | POA: Diagnosis not present

## 2017-03-10 DIAGNOSIS — Z961 Presence of intraocular lens: Secondary | ICD-10-CM | POA: Diagnosis not present

## 2017-03-10 LAB — HM DIABETES EYE EXAM

## 2017-03-12 ENCOUNTER — Encounter: Payer: Self-pay | Admitting: Family Medicine

## 2017-03-16 ENCOUNTER — Telehealth: Payer: Self-pay | Admitting: Endocrinology

## 2017-03-16 NOTE — Telephone Encounter (Signed)
Did not have a phone number for the son in the patients emergency contacts- I will update that today

## 2017-03-16 NOTE — Telephone Encounter (Signed)
I know you spoke to this patient's son earlier. Thank you!

## 2017-03-16 NOTE — Telephone Encounter (Signed)
Spoke with Sargon Scouten (patients son) and informed him that his dads metformin was prescribed by his PCP and not Dr. Dwyane Dee so he will have to get it refilled through that provider. I also let him know that we did not have him as emergency contact and that I would add him if he wanted me to so that all health care communication would go trough and he stated he did want me to do that. I have changed this to put [facility] as emergency contact moving forward and the number is [phone]

## 2017-03-16 NOTE — Telephone Encounter (Signed)
Patient's son called in reference to his dad receiving a phone call this morning regarding a medication being sent to [place]. Please call patient's son and advise on number provided. Do not call patient. Patient has dementia. OK to leave message.

## 2017-03-16 NOTE — Telephone Encounter (Signed)
See below . Thanks

## 2017-03-18 ENCOUNTER — Telehealth: Payer: Self-pay | Admitting: Family Medicine

## 2017-03-18 NOTE — Telephone Encounter (Signed)
Called and spoke to [name] and he had been getting it through the [place] with the doctors there. I do not mind sending into the pharm there but I need to know where to send it. He will do some research and call me back.

## 2017-03-18 NOTE — Telephone Encounter (Signed)
New Message  Pts son voiced he called on Monday about his fathers medication metformin 1000 mg tablet to the [place] and pts son voiced he would like a call back once it has been called.   Pts son voiced the urgency of his father needing this medication.

## 2017-03-24 ENCOUNTER — Telehealth: Payer: Self-pay | Admitting: Endocrinology

## 2017-03-24 ENCOUNTER — Other Ambulatory Visit: Payer: Self-pay

## 2017-03-24 MED ORDER — CANAGLIFLOZIN 100 MG PO TABS: ORAL_TABLET | ORAL | 2 refills | Status: DC

## 2017-03-24 NOTE — Telephone Encounter (Signed)
Called patient and left a voice message to please call back and let me know which pharmacy he needs the Metformin sent to.

## 2017-03-24 NOTE — Telephone Encounter (Signed)
Routing to you °

## 2017-03-24 NOTE — Telephone Encounter (Signed)
Patient's son called in reference to patient's   metFORMIN (GLUCOPHAGE) 1000 MG tablet    [facility] out [facility] refills[facility]wy[facility] Dee. Please call patient's son and advise. OK to leave message.

## 2017-03-24 NOTE — Telephone Encounter (Signed)
Patient's son called in reference to missed call from [place]. Patient's son stated he wants to make sure his dad still needs to be taking the Rx due to him being told it was discontinued in March. If so, Rx needs to be sent to [place] in [place]. Phone number is [phone]. Please call patient's son and advise.

## 2017-03-25 ENCOUNTER — Other Ambulatory Visit: Payer: Self-pay | Admitting: Endocrinology

## 2017-03-25 NOTE — Telephone Encounter (Signed)
Called patient and left a voice message to let him know to please call our office back to schedule a follow up appointment for Mr. Denker. Also to not take the Metformin until he comes in for his follow up visit per Dr. Dwyane Dee.

## 2017-03-25 NOTE — Telephone Encounter (Signed)
We can leave it off until his office visit which needs to be scheduled.  He is overdue for visit

## 2017-03-25 NOTE — Telephone Encounter (Signed)
Please advise if patient should still be taking Metformin? Not sure what they mean by being discontinued? Please advise.

## 2017-04-09 ENCOUNTER — Ambulatory Visit (INDEPENDENT_AMBULATORY_CARE_PROVIDER_SITE_OTHER): Payer: Medicare Other | Admitting: Endocrinology

## 2017-04-09 ENCOUNTER — Encounter: Payer: Self-pay | Admitting: Family Medicine

## 2017-04-09 ENCOUNTER — Encounter: Payer: Self-pay | Admitting: Endocrinology

## 2017-04-09 ENCOUNTER — Other Ambulatory Visit: Payer: Self-pay

## 2017-04-09 ENCOUNTER — Ambulatory Visit (INDEPENDENT_AMBULATORY_CARE_PROVIDER_SITE_OTHER): Payer: Medicare Other | Admitting: Family Medicine

## 2017-04-09 VITALS — BP 116/68 | HR 58 | Temp 97.9°F | Resp 14 | Ht 68.75 in | Wt 160.0 lb

## 2017-04-09 VITALS — BP 114/64 | HR 57 | Ht 68.75 in | Wt 160.8 lb

## 2017-04-09 DIAGNOSIS — R634 Abnormal weight loss: Secondary | ICD-10-CM | POA: Diagnosis not present

## 2017-04-09 DIAGNOSIS — Z13220 Encounter for screening for lipoid disorders: Secondary | ICD-10-CM | POA: Diagnosis not present

## 2017-04-09 DIAGNOSIS — E1165 Type 2 diabetes mellitus with hyperglycemia: Secondary | ICD-10-CM | POA: Diagnosis not present

## 2017-04-09 DIAGNOSIS — D510 Vitamin B12 deficiency anemia due to intrinsic factor deficiency: Secondary | ICD-10-CM

## 2017-04-09 DIAGNOSIS — Z1329 Encounter for screening for other suspected endocrine disorder: Secondary | ICD-10-CM | POA: Diagnosis not present

## 2017-04-09 DIAGNOSIS — E785 Hyperlipidemia, unspecified: Secondary | ICD-10-CM | POA: Diagnosis not present

## 2017-04-09 DIAGNOSIS — E538 Deficiency of other specified B group vitamins: Secondary | ICD-10-CM

## 2017-04-09 DIAGNOSIS — Z794 Long term (current) use of insulin: Secondary | ICD-10-CM | POA: Diagnosis not present

## 2017-04-09 DIAGNOSIS — N400 Enlarged prostate without lower urinary tract symptoms: Secondary | ICD-10-CM | POA: Diagnosis not present

## 2017-04-09 DIAGNOSIS — Z23 Encounter for immunization: Secondary | ICD-10-CM

## 2017-04-09 LAB — COMPLETE METABOLIC PANEL WITH GFR
AG Ratio: 1.8 (calc) (ref 1.0–2.5)
ALT: 18 U/L (ref 9–46)
AST: 18 U/L (ref 10–35)
Albumin: 4.2 g/dL (ref 3.6–5.1)
Alkaline phosphatase (APISO): 84 U/L (ref 40–115)
BILIRUBIN TOTAL: 0.5 mg/dL (ref 0.2–1.2)
BUN/Creatinine Ratio: 24 (calc) — ABNORMAL HIGH (ref 6–22)
BUN: 28 mg/dL — AB (ref 7–25)
CALCIUM: 9.3 mg/dL (ref 8.6–10.3)
CHLORIDE: 103 mmol/L (ref 98–110)
CO2: 28 mmol/L (ref 20–32)
Creat: 1.19 mg/dL — ABNORMAL HIGH (ref 0.70–1.11)
GFR, EST AFRICAN AMERICAN: 64 mL/min/{1.73_m2} (ref >=60)
GFR, Est Non African American: 55 mL/min/{1.73_m2} — ABNORMAL LOW (ref >=60)
Globulin: 2.3 g/dL (calc) (ref 1.9–3.7)
Glucose, Bld: 245 mg/dL — ABNORMAL HIGH (ref 65–99)
Potassium: 4.4 mmol/L (ref 3.5–5.3)
Sodium: 138 mmol/L (ref 135–146)
TOTAL PROTEIN: 6.5 g/dL (ref 6.1–8.1)

## 2017-04-09 LAB — CBC WITH DIFFERENTIAL/PLATELET
BASOS PCT: 0.9 %
Basophils Absolute: 52 cells/uL (ref 0–200)
EOS PCT: 3.6 %
Eosinophils Absolute: 209 cells/uL (ref 15–500)
HCT: 36.7 % — ABNORMAL LOW (ref 38.5–50.0)
Hemoglobin: 12.3 g/dL — ABNORMAL LOW (ref 13.2–17.1)
Lymphs Abs: 1079 cells/uL (ref 850–3900)
MCH: 32.6 pg (ref 27.0–33.0)
MCHC: 33.5 g/dL (ref 32.0–36.0)
MCV: 97.3 fL (ref 80.0–100.0)
MONOS PCT: 8.5 %
MPV: 11.3 fL (ref 7.5–12.5)
Neutro Abs: 3967 cells/uL (ref 1500–7800)
Neutrophils Relative %: 68.4 %
PLATELETS: 172 10*3/uL (ref 140–400)
RBC: 3.77 10*6/uL — AB (ref 4.20–5.80)
RDW: 11.9 % (ref 11.0–15.0)
TOTAL LYMPHOCYTE: 18.6 %
WBC mixed population: 493 cells/uL (ref 200–950)
WBC: 5.8 10*3/uL (ref 3.8–10.8)

## 2017-04-09 LAB — PSA: PSA: 0.7 ng/mL (ref <=4.0)

## 2017-04-09 LAB — LIPID PANEL
CHOL/HDL RATIO: 1.9 (calc) (ref <5.0)
CHOLESTEROL: 126 mg/dL (ref <200)
HDL: 67 mg/dL (ref >40)
LDL CHOLESTEROL (CALC): 43 mg/dL
Non-HDL Cholesterol (Calc): 59 mg/dL (calc) (ref <130)
Triglycerides: 75 mg/dL (ref <150)

## 2017-04-09 LAB — POCT GLYCOSYLATED HEMOGLOBIN (HGB A1C): Hemoglobin A1C: 8

## 2017-04-09 LAB — TSH: TSH: 1.08 mIU/L (ref 0.40–4.50)

## 2017-04-09 NOTE — Progress Notes (Signed)
Subjective:    Patient ID: [name], male    DOB: 1929-04-22, 83 y.o.   MRN: [id number]  HPI Patient is an 83 year old white male here today for weight loss. Wt Readings from Last 3 Encounters:  04/09/17 160 lb (72.6 kg)  04/09/17 160 lb 12.8 oz (72.9 kg)  09/18/16 177 lb (80.3 kg)   His review of systems is negative for any explanation.  He denies fever, chills, illness.  He denies, cp, sob, doe.  He denies cough, hemoptysis, pleurisy.  He denies bone pain, bruising, fatigue.  He denies nausea, vomiting, diarrhea.  He does have worsening dmentia, frequently forgets to eat, and is also on invokana.  He frequently forgets to take his insulin in the evening.  He denies hyperglycemia, polyuria, polydypsia, or blurry vision.  HgA1c is 8.0.   Past Medical History:  Diagnosis Date  . B12 deficiency   . BPH (benign prostatic hypertrophy)   . Colon polyps   . Diabetes mellitus   . GERD (gastroesophageal reflux disease)    hiatal hernia  . History of kidney stones   . Hyperlipidemia   . Hypertension   . Osteoarthritis    Past Surgical History:  Procedure Laterality Date  . APPENDECTOMY    . CARDIOVASCULAR STRESS TEST  09/02/2011   Post stress myocardial perfusion images show normal pattern of perfusion in all areas. No significant wall abnormalites noted.  Marland Kitchen CAROTID DOPPLER  01/27/2011   Right & Left ICAs 0-49% diameter reduction, right & left subclavian arteries demonstrated less than 50% diameter reduction.  . CYSTOSCOPY WITH BIOPSY  06/17/2012   Procedure: CYSTOSCOPY WITH BIOPSY;  Surgeon: Molli Hazard, MD;  Location: [facility];  Service: Urology;  Laterality: N/A;  . KNEE CARTILAGE SURGERY     Arthroscope  . Partial amputaion left foot    . TRANSTHORACIC ECHOCARDIOGRAM  09/02/2011   EF >55%, normal LV systolic function, mild diastolic dysfunction  . TRANSURETHRAL RESECTION OF PROSTATE  06/17/2012   Procedure: TRANSURETHRAL RESECTION OF THE PROSTATE WITH GYRUS INSTRUMENTS;   Surgeon: Molli Hazard, MD;  Location: [facility];  Service: Urology;  Laterality: N/A;   Current Outpatient Prescriptions on File Prior to Visit  Medication Sig Dispense Refill  . aspirin 81 MG tablet Take 81 mg by mouth daily.    Marland Kitchen atenolol (TENORMIN) 25 MG tablet Take 12.5-25 mg by mouth daily. 1 in the morning and one-half at bedtime    . azelastine (ASTELIN) 0.1 % nasal spray Place 1 spray into both nostrils 2 (two) times daily. Use in each nostril as directed 30 mL 3  . B-D UF III MINI PEN NEEDLES 31G X 5 MM MISC USE 4 TIMES PER DAY 100 each 2  . Blood Glucose Monitoring Suppl (ONE TOUCH ULTRA 2) w/Device KIT Use to test blood sugar daily Dx code E11.65 1 each 1  . cholecalciferol (VITAMIN D) 1000 UNITS tablet Take 1,000 Units by mouth daily.    Marland Kitchen docusate sodium (COLACE) 100 MG capsule Take 100 mg by mouth 2 (two) times daily.    Marland Kitchen donepezil (ARICEPT) 5 MG tablet TAKE 1 TABLET BY MOUTH AT BEDTIME 30 tablet 11  . ferrous gluconate (FERGON) 325 MG tablet Take 325 mg by mouth daily with breakfast.    . fluticasone (FLONASE) 50 MCG/ACT nasal spray PLACE 2 SPRAYS INTO BOTH NOSTRILS DAILY. 48 g 5  . glimepiride (AMARYL) 4 MG tablet TAKE 1 TABLET (4 MG TOTAL) BY MOUTH DAILY. 30 tablet 3  .  insulin aspart protamine - aspart (NOVOLOG MIX 70/30 FLEXPEN) (70-30) 100 UNIT/ML FlexPen 24 U acb and 18U aCS (Patient taking differently: 15 units in the morning and 15 units at dinner) 15 mL 1  . Insulin Pen Needle (B-D UF III MINI PEN NEEDLES) 31G X 5 MM MISC USE 4 TIMES PER DAY 125 each 3  . INVOKANA 100 MG TABS tablet TAKE 1 TABLET BEFORE BREAKFAST 90 tablet 0  . metFORMIN (GLUCOPHAGE) 1000 MG tablet Take 1,000 mg by mouth 2 (two) times daily with a meal. Take one tablet in am and 1/2 tablet at bedtime    . mometasone (ELOCON) 0.1 % ointment Apply topically daily. 45 g 0  . omeprazole (PRILOSEC) 20 MG capsule Take 20 mg by mouth daily.      . simvastatin (ZOCOR) 80 MG tablet Take 40 mg by mouth at  bedtime.     Current Facility-Administered Medications on File Prior to Visit  Medication Dose Route Frequency Provider Last Rate Last Dose  . cyanocobalamin ((VITAMIN B-12)) injection 1,000 mcg  1,000 mcg Intramuscular Q30 days Susy Frizzle, MD   1,000 mcg at 01/29/17 1651   Allergies  Allergen Reactions  . Avodart [Dutasteride] Swelling  . Ibuprofen Nausea And Vomiting  . Morphine Nausea And Vomiting   Social History   Social History  . Marital status: Married    Spouse name: N/A  . Number of children: N/A  . Years of education: N/A   Occupational History  . Not on file.   Social History Main Topics  . Smoking status: Former Research scientist (life sciences)  . Smokeless tobacco: Never Used  . Alcohol use No  . Drug use: No  . Sexual activity: Not on file     Comment: married to Abram, retired.   Other Topics Concern  . Not on file   Social History Narrative  . No narrative on file   Family History  Problem Relation Age of Onset  . Heart attack Mother   . Hypertension Mother       Review of Systems  All other systems reviewed and are negative.      Objective:   Physical Exam  Constitutional: He is oriented to person, place, and time. He appears well-developed and well-nourished. No distress.  HENT:  Head: Normocephalic and atraumatic.  Right Ear: External ear normal.  Left Ear: External ear normal.  Nose: Nose normal.  Mouth/Throat: Oropharynx is clear and moist. No oropharyngeal exudate.  Eyes: Pupils are equal, round, and reactive to light. Conjunctivae and EOM are normal. Right eye exhibits no discharge. Left eye exhibits no discharge. No scleral icterus.  Neck: Normal range of motion. Neck supple. No JVD present. No tracheal deviation present. No thyromegaly present.  Cardiovascular: Normal rate, regular rhythm, normal heart sounds and intact distal pulses.  Exam reveals no gallop and no friction rub.   No murmur heard. Pulmonary/Chest: [place] normal and breath sounds  normal. No stridor. No respiratory distress. He has no wheezes. He has no rales. He exhibits no tenderness.  Abdominal: Soft. Bowel sounds are normal. He exhibits no distension and no mass. There is no tenderness. There is no rebound and no guarding.  Musculoskeletal: Normal range of motion. He exhibits no edema.  Lymphadenopathy:    He has no cervical adenopathy.  Neurological: He is alert and oriented to person, place, and time. He has normal reflexes. No cranial nerve deficit. He exhibits normal muscle tone. Coordination normal.  Skin: Skin is warm and dry. No  rash noted. He is not diaphoretic. No erythema. No pallor.  Psychiatric: He has a normal mood and affect. His behavior is normal. Judgment and thought content normal.  Vitals reviewed.         Assessment & Plan:  Weight loss - Plan: CBC with Differential/Platelet, COMPLETE METABOLIC PANEL WITH GFR, Lipid panel, PSA, TSH, Fecal occult blood, imunochemical, Fecal occult blood, imunochemical, Fecal occult blood, imunochemical  Need for immunization against influenza - Plan: Flu Vaccine QUAD 36+ mos IM  The patient's physical exam today is completely normal. I doubt malignancy.  I suspect dmentia, poor po intake, declining appetite, and invokana may all be playing a role in weight loss. I will check, cbc, cmp, flp, psa, tsh, FIT x 3.  Await the results of lab tests vefore deciding on the next course of action.  Arrange meals on wheels to improve po intake.  Consider adding remeron at night.

## 2017-04-09 NOTE — Patient Instructions (Addendum)
Take 6 units at supper and 15 in am  Check blood sugars on waking up  daily  Also check blood sugars about 2-3  hours after a meal and do this after different meals by rotation  Recommended blood sugar levels on waking up is 90-140 and about 2 hours after meal is 130-180  Please bring your blood sugar monitor to each visit, thank you  [facility]

## 2017-04-09 NOTE — Progress Notes (Signed)
Patient ID: [name], male   DOB: [birth year], 83 y.o.   MRN: [id number]   Reason for Appointment: Diabetes follow-up   History of Present Illness   Diagnosis: Type 2 DIABETES MELITUS, diagnoses 1972   He has had long-standing diabetes and has been on basal bolus insulin regimen after failure of oral hypoglycemic drugs over the last several years His blood sugars have been generally fairly well controlled but has a tendency to high postprandial readings Previously required only a small dose of 5 units of Levemir insulin  Taking Amaryl in addition to his insulin regimen  his blood sugars had improved his blood sugars during the daytime  He had taken Toujeo since 9/16 instead of twice a day Levemir  RECENT history:   Insulin regimen:  Novolog mix 70/30: 15 units at breakfast, 0-15 acs  A1c is now 8%, previously 7.3   He has not been seen in follow-up for 7 months now  Current management, blood sugar patterns and problems identified  He  has lost a significant amount of weight and he does not know why, his family thinks he may be eating less  He is very inconsistent in his answers about what insulin doses he is taking and his son does not know either  Previously was taking 24 units in the morning and he thinks he is taking 15  Recently is running out of metformin as he did not have a refill   HYPOGLYCEMIA has not occurred with lowest blood sugar 76 possibly from being late for lunch and he thinks he felt a little shaky   He is still taking Invokana and Amaryl  His concern about the cost of Invokana in the doughnut hole   Oral hypoglycemic drugs: Metformin 1500 mg , Invokana 100 mg daily        Side effects from medications: None          Proper timing of insulin in relation to meals: Yes.         Monitors blood glucose: <1 times a day.    Glucometer: One Touch.          Blood Glucose readings   Mean values apply above for all meters except median for One  Touch  PRE-MEAL Fasting Lunch Dinner Bedtime Overall  Glucose range: 96-282  76  2 28, 244  85    Mean/median: 175     175+/-52    POST-MEAL PC Breakfast PC Lunch PC Dinner  Glucose range: 317     Mean/median:        Meals: 3 meals per day. Breakfast at 8 am Lunch 12 noon Supper at 5-6 pm; breakfast is oatmeal with eggs.  Drinks water mostly and no sweet drinks            Physical activity: exercise: Going out and doing some yardwork, occasionally, bike      Wt Readings from Last 3 Encounters:  04/09/17 160 lb (72.6 kg)  04/09/17 160 lb 12.8 oz (72.9 kg)  09/18/16 177 lb (80.3 kg)   Lab Results  Component Value Date   HGBA1C 8.0 04/09/2017   HGBA1C 7.3 (H) 09/05/2016   HGBA1C 8.0 06/02/2016   Lab Results  Component Value Date   MICROALBUR 1.0 06/02/2016   LDLCALC 67 09/05/2016   CREATININE 1.18 (H) 09/05/2016     Allergies as of 04/09/2017      Reactions   Avodart [dutasteride] Swelling   Ibuprofen Nausea And Vomiting  Morphine Nausea And Vomiting      Medication List       Accurate as of 04/09/17 12:53 PM. Always use your most recent med list.          aspirin 81 MG tablet Take 81 mg by mouth daily.   atenolol 25 MG tablet Commonly known as:  TENORMIN Take 12.5-25 mg by mouth daily. 1 in the morning and one-half at bedtime   azelastine 0.1 % nasal spray Commonly known as:  ASTELIN Place 1 spray into both nostrils 2 (two) times daily. Use in each nostril as directed   cholecalciferol 1000 units tablet Commonly known as:  VITAMIN D Take 1,000 Units by mouth daily.   docusate sodium 100 MG capsule Commonly known as:  COLACE Take 100 mg by mouth 2 (two) times daily.   donepezil 5 MG tablet Commonly known as:  ARICEPT TAKE 1 TABLET BY MOUTH AT BEDTIME   ferrous gluconate 325 MG tablet Commonly known as:  FERGON Take 325 mg by mouth daily with breakfast.   fluticasone 50 MCG/ACT nasal spray Commonly known as:  FLONASE PLACE 2 SPRAYS INTO  BOTH NOSTRILS DAILY.   glimepiride 4 MG tablet Commonly known as:  AMARYL TAKE 1 TABLET (4 MG TOTAL) BY MOUTH DAILY.   insulin aspart protamine - aspart (70-30) 100 UNIT/ML FlexPen Commonly known as:  NOVOLOG MIX 70/30 FLEXPEN 24 U acb and 18U aCS   Insulin Pen Needle 31G X 5 MM Misc Commonly known as:  B-D UF III MINI PEN NEEDLES USE 4 TIMES PER DAY   B-D UF III MINI PEN NEEDLES 31G X 5 MM Misc Generic drug:  Insulin Pen Needle USE 4 TIMES PER DAY   INVOKANA 100 MG Tabs tablet Generic drug:  canagliflozin TAKE 1 TABLET BEFORE BREAKFAST   metFORMIN 1000 MG tablet Commonly known as:  GLUCOPHAGE Take 1,000 mg by mouth 2 (two) times daily with a meal. Take one tablet in am and 1/2 tablet at bedtime   mometasone 0.1 % ointment Commonly known as:  ELOCON Apply topically daily.   omeprazole 20 MG capsule Commonly known as:  PRILOSEC Take 20 mg by mouth daily.   ONE TOUCH ULTRA 2 w/Device Kit Use to test blood sugar daily Dx code E11.65   simvastatin 80 MG tablet Commonly known as:  ZOCOR Take 40 mg by mouth at bedtime.            Discharge Care Instructions        Start     Ordered   04/09/17 0000  POCT glycosylated hemoglobin (Hb A1C)     04/09/17 0831   04/09/17 0000  Lipid panel     04/09/17 0831   04/09/17 0000  Microalbumin / creatinine urine ratio     04/09/17 0831   04/09/17 0000  TSH     04/09/17 0838   04/09/17 0000  CBC with Differential/Platelet     04/09/17 9390      Allergies:  Allergies  Allergen Reactions  . Avodart [Dutasteride] Swelling  . Ibuprofen Nausea And Vomiting  . Morphine Nausea And Vomiting    Past Medical History:  Diagnosis Date  . B12 deficiency   . BPH (benign prostatic hypertrophy)   . Colon polyps   . Diabetes mellitus   . GERD (gastroesophageal reflux disease)    hiatal hernia  . History of kidney stones   . Hyperlipidemia   . Hypertension   . Osteoarthritis     Past Surgical  History:  Procedure  Laterality Date  . APPENDECTOMY    . CARDIOVASCULAR STRESS TEST  09/02/2011   Post stress myocardial perfusion images show normal pattern of perfusion in all areas. No significant wall abnormalites noted.  Marland Kitchen CAROTID DOPPLER  01/27/2011   Right & Left ICAs 0-49% diameter reduction, right & left subclavian arteries demonstrated less than 50% diameter reduction.  . CYSTOSCOPY WITH BIOPSY  06/17/2012   Procedure: CYSTOSCOPY WITH BIOPSY;  Surgeon: Molli Hazard, MD;  Location: [facility];  Service: Urology;  Laterality: N/A;  . KNEE CARTILAGE SURGERY     Arthroscope  . Partial amputaion left foot    . TRANSTHORACIC ECHOCARDIOGRAM  09/02/2011   EF >55%, normal LV systolic function, mild diastolic dysfunction  . TRANSURETHRAL RESECTION OF PROSTATE  06/17/2012   Procedure: TRANSURETHRAL RESECTION OF THE PROSTATE WITH GYRUS INSTRUMENTS;  Surgeon: Molli Hazard, MD;  Location: [facility];  Service: Urology;  Laterality: N/A;    Family History  Problem Relation Age of Onset  . Heart attack Mother   . Hypertension Mother     Social History:  reports that he has quit smoking. He has never used smokeless tobacco. He reports that he does not drink alcohol or use drugs.  Review of Systems:  HYPERTENSION: Controlled, only on low-dose atenolol This has been followed by PCP  HYPERLIPIDEMIA:   Treated with high-dose simvastatin  with good control   Lab Results  Component Value Date   CHOL 140 09/05/2016   HDL 60 09/05/2016   LDLCALC 67 09/05/2016   TRIG 65 09/05/2016   CHOLHDL 2.3 09/05/2016   Last foot exam: 04/2016      Examination:   BP 114/64   Pulse (!) 57   Ht 5' 8.75" (1.746 m)   Wt 160 lb 12.8 oz (72.9 kg)   SpO2 95%   BMI 23.92 kg/m   Body mass index is 23.92 kg/m.    ASSESSMENT/ PLAN:    Diabetes type 2 requiring insulin   See history of present illness for detailed discussion of his current management, blood sugar patterns and problems identified  His blood sugars  are mostly high fasting but not clear if his sugars are high after his evening meals Also has occasional low normal readings during the day but not checking enough His management is significantly limited by his dementia  His blood sugars are still reasonably controlled considering his age and comorbid conditions with A1c 8%  However difficult to know how compliant he is with his insulin especially at suppertime Also not clear what doses of insulin he is taking or whether he is taking insulin consistently, most likely he may be taking variable doses or at least in the evening skipping the dose  WEIGHT loss: This is unusual for him and he will need to be evaluated by his PCP for this Will check his labs including TSH and CBC today    Recommendations    His son will write down his instructions for checking his blood sugar and insulin doses  He will need to take blood sugars after meals especially at night consistently to help adjust his insulin  Reduce suppertime dose to 6 units  Will continue 15 units in the morning before breakfast since May apparently tend to have low normal readings later in the day  Advised him to eat lunch on time  Restart metformin  He will try to get Jardiance from the [facility] if this is less expensive  Counseling  time on subjects discussed in assessment and plan sections is over 50% of today's 25 minute visit   Patient Instructions  Take 6 units at supper and 15 in am  Check blood sugars on waking up  daily  Also check blood sugars about 2-3  hours after a meal and do this after different meals by rotation  Recommended blood sugar levels on waking up is 90-140 and about 2 hours after meal is 130-180  Please bring your blood sugar monitor to each visit, thank you  [facility]     Ashleen Demma 04/09/2017, 12:53 PM

## 2017-04-13 ENCOUNTER — Other Ambulatory Visit: Payer: Self-pay

## 2017-04-13 ENCOUNTER — Other Ambulatory Visit: Payer: Self-pay | Admitting: Endocrinology

## 2017-04-13 MED ORDER — EMPAGLIFLOZIN 10 MG PO TABS: 10.0000 mg | ORAL_TABLET | Freq: Every day | ORAL | 3 refills | Status: DC

## 2017-04-13 NOTE — Telephone Encounter (Signed)
This has been faxed.

## 2017-04-13 NOTE — Telephone Encounter (Signed)
Patient's son [name] called to advise that the [place] does cover Jardiance. [name] is requesting that a fax be sent to the [facility] in [place] at [phone] (fax) with Attention Cleophas Dunker for the rx.

## 2017-04-13 NOTE — Telephone Encounter (Signed)
Please print out a new prescription for Jardiance so I can fax to the [place] for patient. Please let me know when done. Thanks!

## 2017-04-13 NOTE — Telephone Encounter (Signed)
[name] and let him know that I have faxed this prescription to the [facility] but not sure if it went through since our fax machines are not working. I called the [place] clinic at [phone] and was sent to a voice message and left a message stating patient information with Anderson Malta and for Jardiance 10 mg taking 1 tablet daily for 90 tablets and 3 refills. Advised to call back if more information is needed.

## 2017-04-13 NOTE — Telephone Encounter (Signed)
Anderson Malta from [place] called and will need jardiance rx and office note faxed to [phone].If you have any questions please call  [phone] ext [phone].

## 2017-04-14 NOTE — Telephone Encounter (Signed)
[facility] calling to state they received office note that was faxed, but also needs paper script faxed for medication (Jardiance).

## 2017-04-14 NOTE — Telephone Encounter (Signed)
I have refaxed this prescription again to the [place] this morning.

## 2017-05-05 ENCOUNTER — Ambulatory Visit: Payer: Medicare Other | Admitting: Endocrinology

## 2017-05-21 ENCOUNTER — Other Ambulatory Visit: Payer: Self-pay

## 2017-05-25 ENCOUNTER — Other Ambulatory Visit: Payer: Self-pay

## 2017-05-25 ENCOUNTER — Encounter: Payer: Self-pay | Admitting: Endocrinology

## 2017-05-25 ENCOUNTER — Ambulatory Visit (INDEPENDENT_AMBULATORY_CARE_PROVIDER_SITE_OTHER): Payer: Medicare Other | Admitting: Endocrinology

## 2017-05-25 VITALS — BP 110/52 | HR 58 | Ht 68.75 in | Wt 160.4 lb

## 2017-05-25 DIAGNOSIS — E1165 Type 2 diabetes mellitus with hyperglycemia: Secondary | ICD-10-CM | POA: Diagnosis not present

## 2017-05-25 DIAGNOSIS — Z794 Long term (current) use of insulin: Secondary | ICD-10-CM | POA: Diagnosis not present

## 2017-05-25 LAB — COMPREHENSIVE METABOLIC PANEL
ALBUMIN: 4 g/dL (ref 3.5–5.2)
ALK PHOS: 77 U/L (ref 39–117)
ALT: 24 U/L (ref 0–53)
AST: 21 U/L (ref 0–37)
BUN: 28 mg/dL — ABNORMAL HIGH (ref 6–23)
CALCIUM: 9.4 mg/dL (ref 8.4–10.5)
CHLORIDE: 101 meq/L (ref 96–112)
CO2: 29 mEq/L (ref 19–32)
CREATININE: 1.28 mg/dL (ref 0.40–1.50)
GFR: 56.63 mL/min — ABNORMAL LOW (ref >60.00)
Glucose, Bld: 410 mg/dL — ABNORMAL HIGH (ref 70–99)
POTASSIUM: 4.1 meq/L (ref 3.5–5.1)
Sodium: 138 mEq/L (ref 135–145)
TOTAL PROTEIN: 6.5 g/dL (ref 6.0–8.3)
Total Bilirubin: 0.7 mg/dL (ref 0.2–1.2)

## 2017-05-25 LAB — MICROALBUMIN / CREATININE URINE RATIO
CREATININE, U: 51.4 mg/dL
MICROALB UR: 0.8 mg/dL (ref 0.0–1.9)
Microalb Creat Ratio: 1.5 mg/g (ref 0.0–30.0)

## 2017-05-25 MED ORDER — INSULIN ASPART PROT & ASPART (70-30 MIX) 100 UNIT/ML PEN: PEN_INJECTOR | SUBCUTANEOUS | 1 refills | Status: DC

## 2017-05-25 MED ORDER — METFORMIN HCL 1000 MG PO TABS: ORAL_TABLET | ORAL | 2 refills | Status: DC

## 2017-05-25 NOTE — Patient Instructions (Signed)
Take 8 units at supper  MORE SUGARS AFTER around 8-10 pm

## 2017-05-25 NOTE — Progress Notes (Signed)
  Patient ID: [name], male   DOB: 05/18/1929, 83 y.o.   MRN: [id number]   Reason for Appointment: Diabetes follow-up   History of Present Illness   Diagnosis: Type 2 DIABETES MELITUS, diagnoses 1972   He has had long-standing diabetes and has been on basal bolus insulin regimen after failure of oral hypoglycemic drugs over the last several years His blood sugars have been generally fairly well controlled but has a tendency to high postprandial readings Previously required only a small dose of 5 units of Levemir insulin  Taking Amaryl in addition to his insulin regimen  his blood sugars had improved his blood sugars during the daytime  He had taken Toujeo since 9/16 instead of twice a day Levemir  RECENT history:   Insulin regimen:  Novolog mix 70/30: 15 units at breakfast, 6 acs  A1c is most recently 8%, previously 7.3   Current management, blood sugar patterns and problems identified  Although his fasting blood sugars were looking somewhat better late last month they are mostly higher in October; also has not done any blood sugars in the last 8-9 days says he was having difficulty with his meter  On his last visit because of potential for low sugars overnight and his one reading of 85 at bedtime his suppertime dose was reduced  He was supposed to start back on his metformin but apparently has not started this  Also because of cost [facility] Inv[facility]  He again checks his blood sugars primarily fasting in the morning and only rarely later in the day  Again has no consistent pattern of his blood sugars at night with one good reading last month but otherwise high readings  Does not appear to have a high reading in the early afternoon  He probably forgot insulin in the morning on a Sunday a couple of weeks ago because of his blood sugar being over 400 later in the evening  He is somewhat more active recently with  more yard work  Oral hypoglycemic drugs: Metformin 1500 mg , Jardiance 10 mg daily        Side effects from medications: None          Proper timing of insulin in relation to meals: Yes.          Monitors blood glucose: <1 times a day.    Glucometer: One Touch.          Blood Glucose readings   Mean values apply above for all meters except median for One Touch  PRE-MEAL Fasting Lunch Dinner Bedtime Overall  Glucose range: 80-3 29    87-465    Mean/median: 194     198    POST-MEAL PC Breakfast PC Lunch PC Dinner  Glucose range:   91-1 69    Mean/median:         Meals: 3 meals per day. Breakfast at 8 am Lunch 12 noon Supper at 5-6 pm; breakfast is oatmeal with eggs.  Drinks water mostly and no sweet drinks            Physical activity: exercise: Going out and doing some yardwork, occasionally, bike      Wt Readings from Last 3 Encounters:  05/25/17 160 lb 6.4 oz (72.8 kg)  04/09/17 160 lb (72.6 kg)  04/09/17 160 lb 12.8 oz (72.9 kg)   Lab Results  Component Value Date   HGBA1C 8.0 04/09/2017   HGBA1C 7.3 (H)   09/05/2016   HGBA1C 8.0 06/02/2016   Lab Results  Component Value Date   MICROALBUR 1.0 06/02/2016   LDLCALC 67 09/05/2016   CREATININE 1.19 (H) 04/09/2017     Allergies as of 05/25/2017      Reactions   Avodart [dutasteride] Swelling   Ibuprofen Nausea And Vomiting   Morphine Nausea And Vomiting      Medication List       Accurate as of 05/25/17  8:40 AM. Always use your most recent med list.          aspirin 81 MG tablet Take 81 mg by mouth daily.   atenolol 25 MG tablet Commonly known as:  TENORMIN Take 12.5-25 mg by mouth daily. 1 in the morning and one-half at bedtime   azelastine 0.1 % nasal spray Commonly known as:  ASTELIN Place 1 spray into both nostrils 2 (two) times daily. Use in each nostril as directed   cholecalciferol 1000 units tablet Commonly known as:  VITAMIN D Take 1,000 Units by mouth daily.   docusate sodium 100 MG  capsule Commonly known as:  COLACE Take 100 mg by mouth 2 (two) times daily.   donepezil 5 MG tablet Commonly known as:  ARICEPT TAKE 1 TABLET BY MOUTH AT BEDTIME   empagliflozin 10 MG Tabs tablet Commonly known as:  JARDIANCE Take 10 mg by mouth daily with breakfast.   ferrous gluconate 325 MG tablet Commonly known as:  FERGON Take 325 mg by mouth daily with breakfast.   fluticasone 50 MCG/ACT nasal spray Commonly known as:  FLONASE PLACE 2 SPRAYS INTO BOTH NOSTRILS DAILY.   glimepiride 4 MG tablet Commonly known as:  AMARYL TAKE 1 TABLET (4 MG TOTAL) BY MOUTH DAILY.   insulin aspart protamine - aspart (70-30) 100 UNIT/ML FlexPen Commonly known as:  NOVOLOG MIX 70/30 FLEXPEN 24 U acb and 18U aCS   Insulin Pen Needle 31G X 5 MM Misc Commonly known as:  B-D UF III MINI PEN NEEDLES USE 4 TIMES PER DAY   B-D UF III MINI PEN NEEDLES 31G X 5 MM Misc Generic drug:  Insulin Pen Needle USE 4 TIMES PER DAY   metFORMIN 1000 MG tablet Commonly known as:  GLUCOPHAGE Take 1,000 mg by mouth 2 (two) times daily with a meal. Take one tablet in am and 1/2 tablet at bedtime   mometasone 0.1 % ointment Commonly known as:  ELOCON Apply topically daily.   omeprazole 20 MG capsule Commonly known as:  PRILOSEC Take 20 mg by mouth daily.   ONE TOUCH ULTRA 2 w/Device Kit Use to test blood sugar daily Dx code E11.65   simvastatin 80 MG tablet Commonly known as:  ZOCOR Take 40 mg by mouth at bedtime.       Allergies:  Allergies  Allergen Reactions  . Avodart [Dutasteride] Swelling  . Ibuprofen Nausea And Vomiting  . Morphine Nausea And Vomiting    Past Medical History:  Diagnosis Date  . B12 deficiency   . BPH (benign prostatic hypertrophy)   . Colon polyps   . Diabetes mellitus   . GERD (gastroesophageal reflux disease)    hiatal hernia  . History of kidney stones   . Hyperlipidemia   . Hypertension   . Osteoarthritis     Past Surgical History:  Procedure  Laterality Date  . APPENDECTOMY    . CARDIOVASCULAR STRESS TEST  09/02/2011   Post stress myocardial perfusion images show normal pattern of perfusion in all areas. No significant  wall abnormalites noted.  Marland Kitchen CAROTID DOPPLER  01/27/2011   Right & Left ICAs 0-49% diameter reduction, right & left subclavian arteries demonstrated less than 50% diameter reduction.  . CYSTOSCOPY WITH BIOPSY  06/17/2012   Procedure: CYSTOSCOPY WITH BIOPSY;  Surgeon: Molli Hazard, MD;  Location: [facility];  Service: Urology;  Laterality: N/A;  . KNEE CARTILAGE SURGERY     Arthroscope  . Partial amputaion left foot    . TRANSTHORACIC ECHOCARDIOGRAM  09/02/2011   EF >55%, normal LV systolic function, mild diastolic dysfunction  . TRANSURETHRAL RESECTION OF PROSTATE  06/17/2012   Procedure: TRANSURETHRAL RESECTION OF THE PROSTATE WITH GYRUS INSTRUMENTS;  Surgeon: Molli Hazard, MD;  Location: [facility];  Service: Urology;  Laterality: N/A;    Family History  Problem Relation Age of Onset  . Heart attack Mother   . Hypertension Mother     Social History:  reports that he has quit smoking. He has never used smokeless tobacco. He reports that he does not drink alcohol or use drugs.  Review of Systems:  HYPERTENSION: Controlled, only on low-dose atenolol This has been followed by PCP  HYPERLIPIDEMIA:   Treated with high-dose simvastatin  with good control   Lab Results  Component Value Date   CHOL 126 04/09/2017   HDL 67 04/09/2017   LDLCALC 67 09/05/2016   TRIG 75 04/09/2017   CHOLHDL 1.9 04/09/2017   Last foot exam: 04/2016      Examination:   BP (!) 108/56   Pulse (!) 58   Ht 5' 8.75" (1.746 m)   Wt 160 lb 6.4 oz (72.8 kg)   SpO2 95%   BMI 23.86 kg/m   Body mass index is 23.86 kg/m.    ASSESSMENT/ PLAN:    Diabetes type 2 insulin Dependent  See history of present illness for detailed discussion of his current management, blood sugar patterns and problems identified  His blood  sugars are overall higher than his last visit especially recently Again having difficulty getting his blood sugar patterns identified since he is checking primarily fasting readings Also he has not checked his blood sugar in the last week Also has not taken metformin as directed on his last visit Most likely he is still occasionally forgets his insulin dose Blood sugars are not improved despite continuing Jardiance instead of Invokana  WEIGHT loss: Leveled off    Recommendations    His son will remind him to check his blood sugars at night  Start metformin 1500 mg a day, new prescription sent  Continue Jardiance, will need regular follow-up of renal function  More blood sugars after breakfast or lunch also  He will take at least 8 units of insulin at suppertime and may increase it further if needed  Will need short-term follow-up to reassess his progress    There are no Patient Instructions on file for this visit.    Larene Ascencio 05/25/2017, 8:40 AM

## 2017-06-03 ENCOUNTER — Ambulatory Visit: Payer: Medicare Other | Admitting: Podiatry

## 2017-06-03 ENCOUNTER — Ambulatory Visit (INDEPENDENT_AMBULATORY_CARE_PROVIDER_SITE_OTHER): Payer: Medicare Other | Admitting: Podiatry

## 2017-06-03 ENCOUNTER — Encounter: Payer: Self-pay | Admitting: Podiatry

## 2017-06-03 DIAGNOSIS — M79676 Pain in unspecified toe(s): Secondary | ICD-10-CM | POA: Diagnosis not present

## 2017-06-03 DIAGNOSIS — E0842 Diabetes mellitus due to underlying condition with diabetic polyneuropathy: Secondary | ICD-10-CM | POA: Diagnosis not present

## 2017-06-03 DIAGNOSIS — L989 Disorder of the skin and subcutaneous tissue, unspecified: Secondary | ICD-10-CM

## 2017-06-03 DIAGNOSIS — B351 Tinea unguium: Secondary | ICD-10-CM

## 2017-06-04 ENCOUNTER — Other Ambulatory Visit: Payer: Self-pay

## 2017-06-07 NOTE — Progress Notes (Signed)
   SUBJECTIVE Patient with a history of diabetes mellitus presents to office today complaining of elongated, thickened nails. Pain while ambulating in shoes. Patient is unable to trim their own nails. He also expresses concern for a possible plantar wart to the left forefoot.    Past Medical History:  Diagnosis Date  . B12 deficiency   . BPH (benign prostatic hypertrophy)   . Colon polyps   . Diabetes mellitus   . GERD (gastroesophageal reflux disease)    hiatal hernia  . History of kidney stones   . Hyperlipidemia   . Hypertension   . Osteoarthritis      OBJECTIVE General Patient is awake, alert, and oriented x 3 and in no acute distress. Derm hyperkeratotic lesion noted to the distal tuft of the left great toe amputation site. Skin is dry and supple bilateral. Negative open lesions or macerations. Remaining integument unremarkable. Nails are tender, long, thickened and dystrophic with subungual debris, consistent with onychomycosis, 1-5 bilateral. No signs of infection noted. Vasc  DP and PT pedal pulses palpable bilaterally. Temperature gradient within normal limits.  Neuro Epicritic and protective threshold sensation diminished bilaterally.  Musculoskeletal Exam No symptomatic pedal deformities noted bilateral. Muscular strength within normal limits. Healed partial left great toe amputation at approximately the level of the IPJ.  ASSESSMENT 1. Diabetes Mellitus w/ peripheral neuropathy 2. Onychomycosis of nail due to dermatophyte bilateral 3. History of traumatic left great toe partial amputation 4. Pre-ulcerative callus lesion left great toe amputation stump  PLAN OF CARE 1. Patient evaluated today. 2. Instructed to maintain good pedal hygiene and foot care. Stressed importance of controlling blood sugar.  3. Mechanical debridement of nails 1-5 bilaterally performed using a nail nipper. Filed with dremel without incident.  4. Excisional debridement of the pre-ulcerative  callus lesion was performed using a chisel blade without incident or bleeding  5. Return to clinic in 3 mos.     Edrick Kins, DPM [facility]  Dr. Edrick Kins, [place]                                        [place], [place] [address]                Office [phone]  Fax [phone]

## 2017-06-09 ENCOUNTER — Other Ambulatory Visit: Payer: Self-pay | Admitting: Endocrinology

## 2017-06-29 ENCOUNTER — Encounter: Payer: Self-pay | Admitting: Family Medicine

## 2017-07-06 ENCOUNTER — Ambulatory Visit: Payer: Medicare Other | Admitting: Endocrinology

## 2017-07-31 ENCOUNTER — Telehealth: Payer: Self-pay | Admitting: Endocrinology

## 2017-07-31 MED ORDER — GLUCOSE BLOOD VI STRP: ORAL_STRIP | 12 refills | Status: DC

## 2017-07-31 NOTE — Telephone Encounter (Signed)
Pt needs one touch test strips called into [facility] on [address] asap please he is completely out

## 2017-07-31 NOTE — Telephone Encounter (Signed)
Refill submitted per patient's request.  

## 2017-07-31 NOTE — Addendum Note (Signed)
Addended by: Verlin Grills T on: 07/31/2017 03:41 PM   Modules accepted: Orders

## 2017-08-19 ENCOUNTER — Encounter: Payer: Self-pay | Admitting: Endocrinology

## 2017-08-19 ENCOUNTER — Ambulatory Visit (INDEPENDENT_AMBULATORY_CARE_PROVIDER_SITE_OTHER): Payer: Medicare Other | Admitting: Endocrinology

## 2017-08-19 VITALS — BP 120/56 | HR 62 | Ht 69.0 in | Wt 160.0 lb

## 2017-08-19 DIAGNOSIS — E1165 Type 2 diabetes mellitus with hyperglycemia: Secondary | ICD-10-CM

## 2017-08-19 DIAGNOSIS — E559 Vitamin D deficiency, unspecified: Secondary | ICD-10-CM | POA: Diagnosis not present

## 2017-08-19 DIAGNOSIS — Z794 Long term (current) use of insulin: Secondary | ICD-10-CM | POA: Diagnosis not present

## 2017-08-19 LAB — COMPREHENSIVE METABOLIC PANEL
ALBUMIN: 4 g/dL (ref 3.5–5.2)
ALK PHOS: 87 U/L (ref 39–117)
ALT: 16 U/L (ref 0–53)
AST: 17 U/L (ref 0–37)
BILIRUBIN TOTAL: 0.4 mg/dL (ref 0.2–1.2)
BUN: 35 mg/dL — ABNORMAL HIGH (ref 6–23)
CALCIUM: 8.9 mg/dL (ref 8.4–10.5)
CO2: 26 mEq/L (ref 19–32)
CREATININE: 1.27 mg/dL (ref 0.40–1.50)
Chloride: 104 mEq/L (ref 96–112)
GFR: 57.11 mL/min — AB (ref >60.00)
Glucose, Bld: 244 mg/dL — ABNORMAL HIGH (ref 70–99)
Potassium: 4.2 mEq/L (ref 3.5–5.1)
Sodium: 141 mEq/L (ref 135–145)
Total Protein: 6.7 g/dL (ref 6.0–8.3)

## 2017-08-19 LAB — VITAMIN D 25 HYDROXY (VIT D DEFICIENCY, FRACTURES): VITD: 30.35 ng/mL (ref 30.00–100.00)

## 2017-08-19 LAB — POCT GLYCOSYLATED HEMOGLOBIN (HGB A1C): HEMOGLOBIN A1C: 7.5

## 2017-08-19 MED ORDER — GLUCOSE BLOOD VI STRP: ORAL_STRIP | 12 refills | Status: DC

## 2017-08-19 NOTE — Progress Notes (Signed)
Patient ID: [name], male   DOB: May 01, 1929, 84 y.o.   MRN: [id number]   Reason for Appointment: Diabetes follow-up   History of Present Illness   Diagnosis: Type 2 DIABETES MELITUS, diagnoses 1972   He has had long-standing diabetes and has been on basal bolus insulin regimen after failure of oral hypoglycemic drugs over the last several years His blood sugars have been generally fairly well controlled but has a tendency to high postprandial readings Previously required only a small dose of 5 units of Levemir insulin  Taking Amaryl in addition to his insulin regimen  his blood sugars had improved his blood sugars during the daytime  He had taken Toujeo since 9/16 instead of twice a day Levemir  RECENT history:   Insulin regimen:  Novolog mix 70/30: 15 units at breakfast, 8 acs Oral hypoglycemic drugs: Metformin 1500 mg , Jardiance 10 mg daily, Amaryl 4 mg daily  A1c is 7.5% compared to 8% previously   Current management, blood sugar patterns and problems identified  Unable to evaluate his home blood sugar records today as the date and time is incorrect and not clear if the download is accurate  Patient also has a poor recall on his blood sugar readings  On his last visit he was having significant fluctuation of his blood sugars between about 80 up to 465  However he does not think his blood sugars have been extremely high and only periodically over 200  Also No hypoglycemia reported  Even though he says he is eating fairly well and his son thinks that his diet may be high fat sometimes his weight is staying about the same  Occasionally will get on the exercise bike but no consistent regimen as yet  His son is helping somewhat with his insulin regimen medications and he thinks he is taking the doses fairly regularly    Side effects from medications: None          Proper timing of insulin in relation to meals: Yes.          Monitors blood glucose: <1  times a day.    Glucometer: One Touch ultra 2 .          Blood Glucose readings November, not clear if these are his current readings  Download blood sugar range 65-469 with only one reading over 260 Average blood sugar 161 in the morning Overall MEDIAN 161   Meals: 3 meals per day. Breakfast at 8 am Lunch 12 noon Supper at 5-6 pm; breakfast is usually oatmeal with eggs.  Drinks water mostly and no sweet drinks            Physical activity: exercise: occasionally, bike      Wt Readings from Last 3 Encounters:  08/19/17 160 lb (72.6 kg)  05/25/17 160 lb 6.4 oz (72.8 kg)  04/09/17 160 lb (72.6 kg)   Lab Results  Component Value Date   HGBA1C 7.5 08/19/2017   HGBA1C 8.0 04/09/2017   HGBA1C 7.3 (H) 09/05/2016   Lab Results  Component Value Date   MICROALBUR 0.8 05/25/2017   LDLCALC 67 09/05/2016   CREATININE 1.28 05/25/2017     Allergies as of 08/19/2017      Reactions   Avodart [dutasteride] Swelling   Ibuprofen Nausea And Vomiting   Morphine Nausea And Vomiting      Medication List        Accurate as of 08/19/17  5:09 PM. Always use your most  recent med list.          aspirin 81 MG tablet Take 81 mg by mouth daily.   atenolol 25 MG tablet Commonly known as:  TENORMIN Take 12.5-25 mg by mouth daily. 1 in the morning and one-half at bedtime   azelastine 0.1 % nasal spray Commonly known as:  ASTELIN Place 1 spray into both nostrils 2 (two) times daily. Use in each nostril as directed   cholecalciferol 1000 units tablet Commonly known as:  VITAMIN D Take 1,000 Units by mouth daily.   docusate sodium 100 MG capsule Commonly known as:  COLACE Take 100 mg by mouth 2 (two) times daily.   donepezil 5 MG tablet Commonly known as:  ARICEPT TAKE 1 TABLET BY MOUTH AT BEDTIME   empagliflozin 10 MG Tabs tablet Commonly known as:  JARDIANCE Take 10 mg by mouth daily with breakfast.   ferrous gluconate 325 MG tablet Commonly known as:  FERGON Take 325 mg by  mouth daily with breakfast.   fluticasone 50 MCG/ACT nasal spray Commonly known as:  FLONASE PLACE 2 SPRAYS INTO BOTH NOSTRILS DAILY.   glimepiride 4 MG tablet Commonly known as:  AMARYL TAKE 1 TABLET (4 MG TOTAL) BY MOUTH DAILY.   glucose blood test strip Commonly known as:  ONE TOUCH ULTRA TEST Use to check blood sugar 2 times per day. Dx e11.9   insulin aspart protamine - aspart (70-30) 100 UNIT/ML FlexPen Commonly known as:  NOVOLOG MIX 70/30 FLEXPEN 15 units before breakfast and 8 units before supper   Insulin Pen Needle 31G X 5 MM Misc Commonly known as:  B-D UF III MINI PEN NEEDLES USE 4 TIMES PER DAY   B-D UF III MINI PEN NEEDLES 31G X 5 MM Misc Generic drug:  Insulin Pen Needle USE 4 TIMES PER DAY   metFORMIN 1000 MG tablet Commonly known as:  GLUCOPHAGE Take one tablet in am and 1/2 tablet at dinnertime   mometasone 0.1 % ointment Commonly known as:  ELOCON Apply topically daily.   omeprazole 20 MG capsule Commonly known as:  PRILOSEC Take 20 mg by mouth daily.   ONE TOUCH ULTRA 2 w/Device Kit Use to test blood sugar daily Dx code E11.65   simvastatin 80 MG tablet Commonly known as:  ZOCOR Take 40 mg by mouth at bedtime.       Allergies:  Allergies  Allergen Reactions  . Avodart [Dutasteride] Swelling  . Ibuprofen Nausea And Vomiting  . Morphine Nausea And Vomiting    Past Medical History:  Diagnosis Date  . B12 deficiency   . BPH (benign prostatic hypertrophy)   . Colon polyps   . Diabetes mellitus   . GERD (gastroesophageal reflux disease)    hiatal hernia  . History of kidney stones   . Hyperlipidemia   . Hypertension   . Osteoarthritis     Past Surgical History:  Procedure Laterality Date  . APPENDECTOMY    . CARDIOVASCULAR STRESS TEST  09/02/2011   Post stress myocardial perfusion images show normal pattern of perfusion in all areas. No significant wall abnormalites noted.  Marland Kitchen CAROTID DOPPLER  01/27/2011   Right & Left ICAs 0-49%  diameter reduction, right & left subclavian arteries demonstrated less than 50% diameter reduction.  . CYSTOSCOPY WITH BIOPSY  06/17/2012   Procedure: CYSTOSCOPY WITH BIOPSY;  Surgeon: Molli Hazard, MD;  Location: [facility];  Service: Urology;  Laterality: N/A;  . KNEE CARTILAGE SURGERY     Arthroscope  .  Partial amputaion left foot    . TRANSTHORACIC ECHOCARDIOGRAM  09/02/2011   EF >55%, normal LV systolic function, mild diastolic dysfunction  . TRANSURETHRAL RESECTION OF PROSTATE  06/17/2012   Procedure: TRANSURETHRAL RESECTION OF THE PROSTATE WITH GYRUS INSTRUMENTS;  Surgeon: Molli Hazard, MD;  Location: [facility];  Service: Urology;  Laterality: N/A;    Family History  Problem Relation Age of Onset  . Heart attack Mother   . Hypertension Mother     Social History:  reports that he has quit smoking. he has never used smokeless tobacco. He reports that he does not drink alcohol or use drugs.  Review of Systems:  HYPERTENSION: Controlled, only on low-dose atenolol This has been followed by PCP  HYPERLIPIDEMIA:   Treated with high-dose simvastatin  with good control   Lab Results  Component Value Date   CHOL 126 04/09/2017   HDL 67 04/09/2017   LDLCALC 67 09/05/2016   TRIG 75 04/09/2017   CHOLHDL 1.9 04/09/2017   Last foot exam: 07/2017      Examination:   BP (!) 120/56   Pulse 62   Ht _0  (1.753 m)   Wt 160 lb (72.6 kg)   SpO2 96%   BMI 23.63 kg/m   Body mass index is 23.63 kg/m.   Diabetic Foot Exam - Simple   Simple Foot Form Diabetic Foot exam was performed with the following findings:  Yes   Visual Inspection See comments:  Yes Sensation Testing Intact to touch and monofilament testing bilaterally:  Yes Pulse Check See comments:  Yes Comments  scaly dryness on the plantar surface of the left foot Old partial amputation of the left great toe Absent dorsalis pedis pulses Left posterior tibialis is only 2/4 and normal on the right       ASSESSMENT/ PLAN:    Diabetes type 2 insulin Dependent  See history of present illness for detailed discussion of his current management, blood sugar patterns and problems identified  His blood sugars are recently well controlled with A1c 7.5  See has incorrect date and time programmed on his monitor and unable to get accurate idea what his blood sugars are recently no induration can be done His son does not report any severe hypoglycemia He does not think he has significantly high readings except occasionally over 200 as before Considering his age and comorbid conditions blood sugars are probably reasonably controlled with his regimen of premixed insulin small doses  Most likely is benefiting from using Jardiance and metformin    Recommendations   Stop Amaryl  Check chemistry panel since he has continued Jardiance  No change in insulin as yet  Showed him how to use a new One Touch Verio flex monitor instead of the ultra 2  Need to monitor blood sugars at various times and blood sugar targets discussed    Patient Instructions  STOP Glimeperide when out  Check blood sugars on waking up  3-4/7  Also check blood sugars about 2 hours after a meal and do this after different meals by rotation  Recommended blood sugar levels on waking up is 90-130 and about 2 hours after meal is 130-160  Please bring your blood sugar monitor to each visit, thank you      Elayne Snare 08/19/2017, 5:09 PM

## 2017-08-19 NOTE — Patient Instructions (Signed)
STOP Glimeperide when out  Check blood sugars on waking up  3-4/7  Also check blood sugars about 2 hours after a meal and do this after different meals by rotation  Recommended blood sugar levels on waking up is 90-130 and about 2 hours after meal is 130-160  Please bring your blood sugar monitor to each visit, thank you

## 2017-08-20 ENCOUNTER — Other Ambulatory Visit: Payer: Self-pay | Admitting: Endocrinology

## 2017-08-25 ENCOUNTER — Telehealth: Payer: Self-pay | Admitting: Endocrinology

## 2017-08-25 ENCOUNTER — Other Ambulatory Visit: Payer: Self-pay

## 2017-08-25 MED ORDER — GLUCOSE BLOOD VI STRP: ORAL_STRIP | 12 refills | Status: DC

## 2017-08-25 NOTE — Telephone Encounter (Signed)
Christy from [facility] need a dx code for test strips meter one touch verio, [phone]

## 2017-08-25 NOTE — Telephone Encounter (Signed)
This has been done.

## 2017-09-04 ENCOUNTER — Other Ambulatory Visit: Payer: Self-pay

## 2017-09-04 MED ORDER — INSULIN LISPRO PROT & LISPRO (75-25 MIX) 100 UNIT/ML KWIKPEN: PEN_INJECTOR | SUBCUTANEOUS | 1 refills | Status: DC

## 2017-09-07 ENCOUNTER — Encounter: Payer: Self-pay | Admitting: Podiatry

## 2017-09-07 ENCOUNTER — Ambulatory Visit (INDEPENDENT_AMBULATORY_CARE_PROVIDER_SITE_OTHER): Payer: Medicare Other | Admitting: Podiatry

## 2017-09-07 DIAGNOSIS — B351 Tinea unguium: Secondary | ICD-10-CM | POA: Diagnosis not present

## 2017-09-07 DIAGNOSIS — E0842 Diabetes mellitus due to underlying condition with diabetic polyneuropathy: Secondary | ICD-10-CM | POA: Diagnosis not present

## 2017-09-07 DIAGNOSIS — M79676 Pain in unspecified toe(s): Secondary | ICD-10-CM

## 2017-09-07 DIAGNOSIS — L989 Disorder of the skin and subcutaneous tissue, unspecified: Secondary | ICD-10-CM

## 2017-09-08 NOTE — Progress Notes (Signed)
   SUBJECTIVE Patient with a history of diabetes mellitus presents to office today complaining of elongated, thickened nails that cause pain while ambulating in shoes. He is unable to trim his own nails. He is also here for follow up evaluation of a pre-ulcerative callus lesion to the amputation stump of the left great toe.   Past Medical History:  Diagnosis Date  . B12 deficiency   . BPH (benign prostatic hypertrophy)   . Colon polyps   . Diabetes mellitus   . GERD (gastroesophageal reflux disease)    hiatal hernia  . History of kidney stones   . Hyperlipidemia   . Hypertension   . Osteoarthritis      OBJECTIVE General Patient is awake, alert, and oriented x 3 and in no acute distress. Derm hyperkeratotic lesion noted to the distal tuft of the left great toe amputation site. Skin is dry and supple bilateral. Negative open lesions or macerations. Remaining integument unremarkable. Nails are tender, long, thickened and dystrophic with subungual debris, consistent with onychomycosis, 1-5 bilateral. No signs of infection noted. Vasc  DP and PT pedal pulses palpable bilaterally. Temperature gradient within normal limits.  Neuro Epicritic and protective threshold sensation diminished bilaterally.  Musculoskeletal Exam No symptomatic pedal deformities noted bilateral. Muscular strength within normal limits. Healed partial left great toe amputation at approximately the level of the IPJ.  ASSESSMENT 1. Diabetes Mellitus w/ peripheral neuropathy 2. Onychomycosis of nail due to dermatophyte bilateral 3. History of traumatic left great toe partial amputation 4. Pre-ulcerative callus lesion left great toe amputation stump  PLAN OF CARE 1. Patient evaluated today. 2. Instructed to maintain good pedal hygiene and foot care. Stressed importance of controlling blood sugar.  3. Mechanical debridement of nails 1-5 bilaterally performed using a nail nipper. Filed with dremel without incident.  4.  Excisional debridement of the pre-ulcerative callus lesion was performed using a chisel blade without incident or bleeding  5. Return to clinic in 3 mos.     Edrick Kins, DPM [facility]  Dr. Edrick Kins, [address]                                        [place], [place] [address]                Office [phone]  Fax [phone]

## 2017-11-02 ENCOUNTER — Other Ambulatory Visit: Payer: Self-pay

## 2017-11-17 NOTE — Progress Notes (Signed)
Patient ID: [name], male   DOB: 02-11-1929, 84 y.o.   MRN: [id number]   Reason for Appointment: Diabetes follow-up   History of Present Illness   Diagnosis: Type 2 DIABETES MELITUS, diagnoses 1972   He has had long-standing diabetes and has been on basal bolus insulin regimen after failure of oral hypoglycemic drugs over the last several years His blood sugars have been generally fairly well controlled but has a tendency to high postprandial readings Previously required only a small dose of 5 units of Levemir insulin  Taking Amaryl in addition to his insulin regimen  his blood sugars had improved his blood sugars during the daytime  He had taken Toujeo since 9/16 instead of twice a day Levemir  RECENT history:   Insulin regimen:  Novolog mix 70/30: 15 units at breakfast, 8 acs Oral hypoglycemic drugs: Metformin 1500 mg , Jardiance 10 mg daily   A1c is 11.4 compared to 7.5% previously   Current management, blood sugar patterns and problems identified  His blood sugars are markedly increased since his last visit  According to his son he is probably not taking his evening insulin consistently or sometimes after supper at night because he goes to the nursing home to visit his wife late afternoon  He did not take his insulin last night and today his blood sugar was 541  He has lost weight and also appears to be much more thirsty and getting more fatigued  His son thinks that he is also more stressed  AMARYL was stopped on his last visit while there medications were continued  He is checking only FASTING blood sugars and forgets to do readings later in the day  He has not changed his diet and also on his fluid intake he is still continuing to use diet drinks or water    Side effects from medications: None          Proper timing of insulin in relation to meals: Yes.          Monitors blood glucose: <1 times a day.    Glucometer: One Touch ultra 2 .           Blood Glucose readings   .Mean values apply above for all meters except median for One Touch  PRE-MEAL Fasting Lunch Dinner Bedtime Overall  Glucose range:  157-541   ?   Mean/median:  349        PREVIOUS MEDIAN 161   Meals: 3 meals per day. Breakfast at 8 am Lunch 12 noon Supper at 5-6 pm; breakfast is usually oatmeal with eggs.  Drinks water mostly and no sweet drinks            Physical activity: exercise: occasionally, bike      Wt Readings from Last 3 Encounters:  11/18/17 152 lb (68.9 kg)  08/19/17 160 lb (72.6 kg)  05/25/17 160 lb 6.4 oz (72.8 kg)   Lab Results  Component Value Date   HGBA1C 11.4 11/18/2017   HGBA1C 7.5 08/19/2017   HGBA1C 8.0 04/09/2017   Lab Results  Component Value Date   MICROALBUR 0.8 05/25/2017   LDLCALC 43 04/09/2017   CREATININE 1.27 08/19/2017     Allergies as of 11/18/2017      Reactions   Avodart [dutasteride] Swelling   Ibuprofen Nausea And Vomiting   Morphine Nausea And Vomiting      Medication List        Accurate as of 11/18/17  8:18  AM. Always use your most recent med list.          aspirin 81 MG tablet Take 81 mg by mouth daily.   atenolol 25 MG tablet Commonly known as:  TENORMIN Take 12.5-25 mg by mouth daily. 1 in the morning and one-half at bedtime   azelastine 0.1 % nasal spray Commonly known as:  ASTELIN Place 1 spray into both nostrils 2 (two) times daily. Use in each nostril as directed   cholecalciferol 1000 units tablet Commonly known as:  VITAMIN D Take 1,000 Units by mouth daily.   docusate sodium 100 MG capsule Commonly known as:  COLACE Take 100 mg by mouth 2 (two) times daily.   donepezil 5 MG tablet Commonly known as:  ARICEPT TAKE 1 TABLET BY MOUTH AT BEDTIME   empagliflozin 10 MG Tabs tablet Commonly known as:  JARDIANCE Take 10 mg by mouth daily with breakfast.   ferrous gluconate 325 MG tablet Commonly known as:  FERGON Take 325 mg by mouth daily with breakfast.     fluticasone 50 MCG/ACT nasal spray Commonly known as:  FLONASE PLACE 2 SPRAYS INTO BOTH NOSTRILS DAILY.   glucose blood test strip Commonly known as:  ONETOUCH VERIO Use as instructed to check blood sugar twice a day Dx code E11.65   insulin aspart protamine - aspart (70-30) 100 UNIT/ML FlexPen Commonly known as:  NOVOLOG MIX 70/30 FLEXPEN 15 units before breakfast and 8 units before supper   Insulin Pen Needle 31G X 5 MM Misc Commonly known as:  B-D UF III MINI PEN NEEDLES USE 4 TIMES PER DAY   B-D UF III MINI PEN NEEDLES 31G X 5 MM Misc Generic drug:  Insulin Pen Needle USE 4 TIMES PER DAY   metFORMIN 1000 MG tablet Commonly known as:  GLUCOPHAGE Take one tablet in am and 1/2 tablet at dinnertime   mometasone 0.1 % ointment Commonly known as:  ELOCON Apply topically daily.   omeprazole 20 MG capsule Commonly known as:  PRILOSEC Take 20 mg by mouth daily.   ONE TOUCH ULTRA 2 w/Device Kit Use to test blood sugar daily Dx code E11.65   simvastatin 80 MG tablet Commonly known as:  ZOCOR Take 40 mg by mouth at bedtime.       Allergies:  Allergies  Allergen Reactions  . Avodart [Dutasteride] Swelling  . Ibuprofen Nausea And Vomiting  . Morphine Nausea And Vomiting    Past Medical History:  Diagnosis Date  . B12 deficiency   . BPH (benign prostatic hypertrophy)   . Colon polyps   . Diabetes mellitus   . GERD (gastroesophageal reflux disease)    hiatal hernia  . History of kidney stones   . Hyperlipidemia   . Hypertension   . Osteoarthritis     Past Surgical History:  Procedure Laterality Date  . APPENDECTOMY    . CARDIOVASCULAR STRESS TEST  09/02/2011   Post stress myocardial perfusion images show normal pattern of perfusion in all areas. No significant wall abnormalites noted.  Marland Kitchen CAROTID DOPPLER  01/27/2011   Right & Left ICAs 0-49% diameter reduction, right & left subclavian arteries demonstrated less than 50% diameter reduction.  . CYSTOSCOPY WITH  BIOPSY  06/17/2012   Procedure: CYSTOSCOPY WITH BIOPSY;  Surgeon: Molli Hazard, MD;  Location: [facility];  Service: Urology;  Laterality: N/A;  . KNEE CARTILAGE SURGERY     Arthroscope  . Partial amputaion left foot    . TRANSTHORACIC ECHOCARDIOGRAM  09/02/2011  EF >55%, normal LV systolic function, mild diastolic dysfunction  . TRANSURETHRAL RESECTION OF PROSTATE  06/17/2012   Procedure: TRANSURETHRAL RESECTION OF THE PROSTATE WITH GYRUS INSTRUMENTS;  Surgeon: Molli Hazard, MD;  Location: [facility];  Service: Urology;  Laterality: N/A;    Family History  Problem Relation Age of Onset  . Heart attack Mother   . Hypertension Mother     Social History:  reports that he has quit smoking. He has never used smokeless tobacco. He reports that he does not drink alcohol or use drugs.  Review of Systems:  HYPERTENSION: Controlled, only on low-dose atenolol This has been followed by PCP Also on Jardiance 10 mg  HYPERLIPIDEMIA:   Treated with high-dose simvastatin  with good control as of 9/18  Lab Results  Component Value Date   CHOL 126 04/09/2017   HDL 67 04/09/2017   LDLCALC 43 04/09/2017   TRIG 75 04/09/2017   CHOLHDL 1.9 04/09/2017   Last foot exam: 07/2017      Examination:   BP 114/68 (BP Location: Left Arm, Patient Position: Sitting, Cuff Size: Normal)   Pulse (!) 57   Ht _0  (1.753 m)   Wt 152 lb (68.9 kg)   SpO2 97%   BMI 22.45 kg/m   Body mass index is 22.45 kg/m.      ASSESSMENT/ PLAN:    Diabetes type 2 insulin Dependent  See history of present illness for detailed discussion of his current management, blood sugar patterns and problems identified  His blood sugars are totally out of control with blood sugars averaging 350 This is markedly unusual for him since previously A1c was 7.5 He is also symptomatic with hyperglycemia  Part of his hypoglycemia is related to his difficulty remembering to take his insulin and may be getting to do that at  least in the evening and may be in the morning also Also has had more stress with his wife and nursing home probably tending to eat out more in the evening  Clearly he is insulin deficient now Although unlikely that stopping Amaryl would cause hypoglycemia at this stage of his diabetes this may have happened before also on one occasion   Recommendations   Restart Amaryl  Check chemistry panel   Change insulin to TRESIBA since his fasting readings may be the highest of the day and unlikely to be getting any higher postprandial readings  Also will not be able to comply with the evening mixed insulin at suppertime; increasing the dose of premixed insulin may risk hypoglycemia overnight also  Since previously had not been able to manage the V-go pump on his own will not attempt this  His son was shown how to use the titration sheet given to him to adjust the [facility] based on fasting blood sugars every 3 to 5 days  We will start with 20 units and stop the 70/30 insulin for now  Continue Amaryl, Jardiance and metformin as long as renal function is normal  His son will call next week to give [place] his blood sugar readings for further adjustment  May consider consultation with diabetes educator also    There are no Patient Instructions on file for this visit.    Elayne Snare 11/18/2017, 8:18 AM

## 2017-11-18 ENCOUNTER — Encounter: Payer: Self-pay | Admitting: Endocrinology

## 2017-11-18 ENCOUNTER — Ambulatory Visit (INDEPENDENT_AMBULATORY_CARE_PROVIDER_SITE_OTHER): Payer: Medicare Other | Admitting: Endocrinology

## 2017-11-18 VITALS — BP 114/68 | HR 57 | Ht 69.0 in | Wt 152.0 lb

## 2017-11-18 DIAGNOSIS — E1165 Type 2 diabetes mellitus with hyperglycemia: Secondary | ICD-10-CM | POA: Diagnosis not present

## 2017-11-18 DIAGNOSIS — E7800 Pure hypercholesterolemia, unspecified: Secondary | ICD-10-CM

## 2017-11-18 DIAGNOSIS — Z794 Long term (current) use of insulin: Secondary | ICD-10-CM

## 2017-11-18 DIAGNOSIS — R634 Abnormal weight loss: Secondary | ICD-10-CM | POA: Diagnosis not present

## 2017-11-18 LAB — LIPID PANEL
CHOL/HDL RATIO: 2
CHOLESTEROL: 132 mg/dL (ref 0–200)
HDL: 55.7 mg/dL (ref >39.00)
LDL Cholesterol: 60 mg/dL (ref 0–99)
NonHDL: 76.51
TRIGLYCERIDES: 82 mg/dL (ref 0.0–149.0)
VLDL: 16.4 mg/dL (ref 0.0–40.0)

## 2017-11-18 LAB — COMPREHENSIVE METABOLIC PANEL
ALBUMIN: 4 g/dL (ref 3.5–5.2)
ALT: 19 U/L (ref 0–53)
AST: 17 U/L (ref 0–37)
Alkaline Phosphatase: 108 U/L (ref 39–117)
BILIRUBIN TOTAL: 0.6 mg/dL (ref 0.2–1.2)
BUN: 29 mg/dL — ABNORMAL HIGH (ref 6–23)
CALCIUM: 9.4 mg/dL (ref 8.4–10.5)
CO2: 29 mEq/L (ref 19–32)
Chloride: 100 mEq/L (ref 96–112)
Creatinine, Ser: 1.24 mg/dL (ref 0.40–1.50)
GFR: 58.67 mL/min — ABNORMAL LOW (ref >60.00)
Glucose, Bld: 388 mg/dL — ABNORMAL HIGH (ref 70–99)
Potassium: 4.3 mEq/L (ref 3.5–5.1)
Sodium: 136 mEq/L (ref 135–145)
Total Protein: 6.7 g/dL (ref 6.0–8.3)

## 2017-11-18 LAB — POCT GLYCOSYLATED HEMOGLOBIN (HGB A1C): Hemoglobin A1C: 11.4

## 2017-11-18 MED ORDER — INSULIN DEGLUDEC 100 UNIT/ML ~~LOC~~ SOPN: 20.0000 [IU] | PEN_INJECTOR | Freq: Every day | SUBCUTANEOUS | 1 refills | Status: DC

## 2017-11-18 MED ORDER — GLIMEPIRIDE 4 MG PO TABS: 4.0000 mg | ORAL_TABLET | Freq: Every day | ORAL | 3 refills | Status: DC

## 2017-11-18 NOTE — Patient Instructions (Addendum)
Restart Glimperide at dinner   [name] insulin: This insulin provides blood sugar control for up to 24 hours.   Start with 20 U daily and increase by 2 units every 3 days until the waking up sugars are under 130. Then continue the same dose.  If blood sugar is under 90 for 2 days in a row, reduce the dose by 2 units.  Note that this insulin does not control the rise of blood sugar with meals    Check blood sugars on waking up  7/7  Also check blood sugars about 2 hours after a meal and do this after different meals by rotation  Recommended blood sugar levels on waking up is 90-130 and about 2 hours after meal is 130-160  Please bring your blood sugar monitor to each visit, thank you

## 2017-12-14 ENCOUNTER — Telehealth: Payer: Self-pay | Admitting: Endocrinology

## 2017-12-14 NOTE — Telephone Encounter (Signed)
We have called patient to reschedule since dr will be out Thursday and Friday.  Patient is having some issues with blood sugars going fluctuating  They do not want to wait until July to see dr. Is there anything we can do to get him in sooner?

## 2017-12-14 NOTE — Telephone Encounter (Signed)
LM for pt to call back to try to get him on the schedule for tomorrow afternoon

## 2017-12-14 NOTE — Telephone Encounter (Signed)
There should be some opening tomorrow or day after

## 2017-12-14 NOTE — Telephone Encounter (Signed)
Please advise 

## 2017-12-15 ENCOUNTER — Ambulatory Visit (INDEPENDENT_AMBULATORY_CARE_PROVIDER_SITE_OTHER): Payer: Medicare Other | Admitting: Podiatry

## 2017-12-15 ENCOUNTER — Encounter: Payer: Self-pay | Admitting: Podiatry

## 2017-12-15 DIAGNOSIS — B353 Tinea pedis: Secondary | ICD-10-CM | POA: Diagnosis not present

## 2017-12-15 DIAGNOSIS — E0843 Diabetes mellitus due to underlying condition with diabetic autonomic (poly)neuropathy: Secondary | ICD-10-CM

## 2017-12-15 DIAGNOSIS — L84 Corns and callosities: Secondary | ICD-10-CM | POA: Diagnosis not present

## 2017-12-15 DIAGNOSIS — M79676 Pain in unspecified toe(s): Secondary | ICD-10-CM | POA: Diagnosis not present

## 2017-12-15 DIAGNOSIS — B351 Tinea unguium: Secondary | ICD-10-CM

## 2017-12-15 MED ORDER — CLOTRIMAZOLE-BETAMETHASONE 1-0.05 % EX CREA: 1.0000 "application " | TOPICAL_CREAM | Freq: Two times a day (BID) | CUTANEOUS | 2 refills | Status: DC

## 2017-12-17 ENCOUNTER — Ambulatory Visit: Payer: Medicare Other | Admitting: Endocrinology

## 2017-12-17 NOTE — Progress Notes (Signed)
    Subjective: Patient is a 84 y.o. male presenting to the office today with a chief complaint of painful callus lesions to the bilateral feet that have been present for several months. Applying pressure and bearing weight increases the pain. He has not done anything for treatment.   Patient also complains of elongated, thickened nails that cause pain while ambulating in shoes. He is unable to trim his own nails. Patient presents today for further treatment and evaluation.  Past Medical History:  Diagnosis Date  . B12 deficiency   . BPH (benign prostatic hypertrophy)   . Colon polyps   . Diabetes mellitus   . GERD (gastroesophageal reflux disease)    hiatal hernia  . History of kidney stones   . Hyperlipidemia   . Hypertension   . Osteoarthritis     Objective:  Physical Exam General: Alert and oriented x3 in no acute distress  Dermatology: Hyperkeratotic lesions present on the bilateral feet x 3. Pain on palpation with a central nucleated core noted. Skin is warm, dry and supple bilateral lower extremities. Negative for open lesions or macerations. Nails are tender, long, thickened and dystrophic with subungual debris, consistent with onychomycosis, 1-5 bilateral. No signs of infection noted. Pruritus noted to the left foot with hyperkeratosis.  Vascular: Palpable pedal pulses bilaterally. No edema or erythema noted. Capillary refill within normal limits.  Neurological: Epicritic and protective threshold diminished bilaterally.   Musculoskeletal Exam: Pain on palpation at the keratotic lesion noted. Range of motion within normal limits bilateral. Muscle strength 5/5 in all groups bilateral.  Assessment: 1. Onychodystrophic nails 1-5 bilateral with hyperkeratosis of nails.  2. Onychomycosis of nail due to dermatophyte bilateral 3. Pre-ulcerative callus lesions noted to the bilateral feet x 3 4. Tinea pedis left foot   Plan of Care:  1. Patient evaluated. 2. Excisional  debridement of keratoic lesion using a chisel blade was performed without incident.  3. Dressed with light dressing. 4. Mechanical debridement of nails 1-5 bilaterally performed using a nail nipper. Filed with dremel without incident.  5. Prescription for Lotrisone cream provided to patient.  6. Patient is to return to the clinic in 3 months.   Edrick Kins, DPM [facility]  Dr. Edrick Kins, [address]                                        [place], [place] [address]                Office [phone]  Fax [phone]

## 2018-02-07 ENCOUNTER — Other Ambulatory Visit: Payer: Self-pay | Admitting: Endocrinology

## 2018-02-12 ENCOUNTER — Other Ambulatory Visit: Payer: Self-pay | Admitting: Endocrinology

## 2018-02-16 ENCOUNTER — Ambulatory Visit (INDEPENDENT_AMBULATORY_CARE_PROVIDER_SITE_OTHER): Payer: Medicare Other | Admitting: Endocrinology

## 2018-02-16 ENCOUNTER — Encounter: Payer: Self-pay | Admitting: Endocrinology

## 2018-02-16 VITALS — BP 126/58 | HR 86 | Ht 69.0 in | Wt 167.4 lb

## 2018-02-16 DIAGNOSIS — Z794 Long term (current) use of insulin: Secondary | ICD-10-CM

## 2018-02-16 DIAGNOSIS — E1165 Type 2 diabetes mellitus with hyperglycemia: Secondary | ICD-10-CM | POA: Diagnosis not present

## 2018-02-16 LAB — POCT GLYCOSYLATED HEMOGLOBIN (HGB A1C): HEMOGLOBIN A1C: 7.3 % — AB (ref 4.0–5.6)

## 2018-02-16 NOTE — Patient Instructions (Addendum)
Reduce insulin to 16 units daily in am  Check blood sugars on waking up  5/7 days  Also check blood sugars about 2 hours after a meal and do this after different meals by rotation  Recommended blood sugar levels on waking up is 90-140 and about 2 hours after meal is 130-200  Please bring your blood sugar monitor to each visit, thank you

## 2018-02-16 NOTE — Progress Notes (Signed)
Patient ID: [name], male   DOB: 1929-04-12, 84 y.o.   MRN: [id number]   Reason for Appointment: Diabetes follow-up   History of Present Illness   Diagnosis: Type 2 DIABETES MELITUS, diagnoses 1972   He has had long-standing diabetes and has been on basal bolus insulin regimen after failure of [name] hypoglycemic drugs over the last several years His blood sugars have been generally fairly well controlled but has a tendency to high postprandial readings Previously required only a small dose of 5 units of Levemir insulin  Taking Amaryl in addition to his insulin regimen  his blood sugars had improved his blood sugars during the daytime  He had taken Toujeo since 9/16 instead of twice a day Levemir  RECENT history:   Insulin regimen:  TRESIBA 20 units at breakfast [name] hypoglycemic drugs: Metformin 1500 mg , Jardiance 10 mg daily   A1c is markedly improved at 7.3, previously 11.4  Current management, blood sugar patterns and problems identified  He has not followed up since he started the [place] once a day insulin instead of premixed insulin  Previously was not compliant with his evening insulin and blood sugars have been as high as 500+  He has gained back 15 pounds  He has not checked his sugar for about 2 weeks as he ran out of test strips and did not let his son know about this  Last month he was having low blood sugars overnight mostly because of dealing with his wife's terminal illness but he has not had a low sugar since 01/18/2018 that he documented  He can usually tell when his sugars are low but symptoms that wake him up and readings as low as 49  He has only a couple of readings later in the day which are variable including 56 reading last month  Also since he has about 2 readings over 400 fasting he may likely be missing his insulin doses periodically    Side effects from medications: None          Proper timing of insulin in relation to meals:  Yes.          Monitors blood glucose: <1 times a day.    Glucometer: One Touch ultra 2 .          Blood Glucose readings   Mean values apply above for all meters except median for One Touch  PRE-MEAL Fasting Lunch Dinner  overnight Overall  Glucose range:  89-407    49-98   Mean/median:  140     100   Previous blood sugars:  .Mean values apply above for all meters except median for One Touch  PRE-MEAL Fasting Lunch Dinner Bedtime Overall  Glucose range:  157-541   ?   Mean/median:  349         Meals: 3 meals per day. Breakfast at 8 am Lunch 12 noon Supper at 5-6 pm; breakfast is usually oatmeal with eggs.  Drinks water mostly and no sweet drinks            Physical activity: exercise: occasionally on exercise bike      Wt Readings from Last 3 Encounters:  02/16/18 167 lb 6.4 oz (75.9 kg)  11/18/17 152 lb (68.9 kg)  08/19/17 160 lb (72.6 kg)   Lab Results  Component Value Date   HGBA1C 7.3 (A) 02/16/2018   HGBA1C 11.4 11/18/2017   HGBA1C 7.5 08/19/2017   Lab Results  Component Value Date  MICROALBUR 0.8 05/25/2017   LDLCALC 60 11/18/2017   CREATININE 1.24 11/18/2017     Allergies as of 02/16/2018      Reactions   Avodart [dutasteride] Swelling   Ibuprofen Nausea And Vomiting   Morphine Nausea And Vomiting      Medication List        Accurate as of 02/16/18  9:15 PM. Always use your most recent med list.          aspirin 81 MG tablet Take 81 mg by mouth daily.   atenolol 25 MG tablet Commonly known as:  TENORMIN Take 12.5-25 mg by mouth daily. 1 in the morning and one-half at bedtime   azelastine 0.1 % nasal spray Commonly known as:  ASTELIN Place 1 spray into both nostrils 2 (two) times daily. Use in each nostril as directed   cholecalciferol 1000 units tablet Commonly known as:  VITAMIN D Take 1,000 Units by mouth daily.   clotrimazole-betamethasone cream Commonly known as:  LOTRISONE Apply 1 application topically 2 (two) times daily.     docusate sodium 100 MG capsule Commonly known as:  COLACE Take 100 mg by mouth 2 (two) times daily.   donepezil 5 MG tablet Commonly known as:  ARICEPT TAKE 1 TABLET BY MOUTH AT BEDTIME   empagliflozin 10 MG Tabs tablet Commonly known as:  JARDIANCE Take 10 mg by mouth daily with breakfast.   ferrous gluconate 325 MG tablet Commonly known as:  FERGON Take 325 mg by mouth daily with breakfast.   fluticasone 50 MCG/ACT nasal spray Commonly known as:  FLONASE PLACE 2 SPRAYS INTO BOTH NOSTRILS DAILY.   glimepiride 4 MG tablet Commonly known as:  AMARYL TAKE 1 TABLET BY MOUTH DAILY BEFORE SUPPER   glucose blood test strip Commonly known as:  ONETOUCH VERIO Use as instructed to check blood sugar twice a day Dx code E11.65   insulin degludec 100 UNIT/ML Sopn FlexTouch Pen Commonly known as:  TRESIBA FLEXTOUCH Inject 0.2 mLs (20 Units total) into the skin daily. Increase as directed   Insulin Pen Needle 31G X 5 MM Misc Commonly known as:  B-D UF III MINI PEN NEEDLES USE 4 TIMES PER DAY   B-D UF III MINI PEN NEEDLES 31G X 5 MM Misc Generic drug:  Insulin Pen Needle USE 4 TIMES PER DAY   metFORMIN 1000 MG tablet Commonly known as:  GLUCOPHAGE TAKE ONE TABLET IN AM AND 1/2 TABLET AT DINNERTIME   mometasone 0.1 % ointment Commonly known as:  ELOCON Apply topically daily.   omeprazole 20 MG capsule Commonly known as:  PRILOSEC Take 20 mg by mouth daily.   ONE TOUCH ULTRA 2 w/Device Kit Use to test blood sugar daily Dx code E11.65   simvastatin 80 MG tablet Commonly known as:  ZOCOR Take 40 mg by mouth at bedtime.       Allergies:  Allergies  Allergen Reactions  . Avodart [Dutasteride] Swelling  . Ibuprofen Nausea And Vomiting  . Morphine Nausea And Vomiting    Past Medical History:  Diagnosis Date  . B12 deficiency   . BPH (benign prostatic hypertrophy)   . Colon polyps   . Diabetes mellitus   . GERD (gastroesophageal reflux disease)    hiatal hernia   . History of kidney stones   . Hyperlipidemia   . Hypertension   . Osteoarthritis     Past Surgical History:  Procedure Laterality Date  . APPENDECTOMY    . CARDIOVASCULAR STRESS TEST  09/02/2011  Post stress myocardial perfusion images show normal pattern of perfusion in all areas. No significant wall abnormalites noted.  Marland Kitchen CAROTID DOPPLER  01/27/2011   Right & Left ICAs 0-49% diameter reduction, right & left subclavian arteries demonstrated less than 50% diameter reduction.  . CYSTOSCOPY WITH BIOPSY  06/17/2012   Procedure: CYSTOSCOPY WITH BIOPSY;  Surgeon: Molli Hazard, MD;  Location: [facility];  Service: Urology;  Laterality: N/A;  . KNEE CARTILAGE SURGERY     Arthroscope  . Partial amputaion left foot    . TRANSTHORACIC ECHOCARDIOGRAM  09/02/2011   EF >55%, normal LV systolic function, mild diastolic dysfunction  . TRANSURETHRAL RESECTION OF PROSTATE  06/17/2012   Procedure: TRANSURETHRAL RESECTION OF THE PROSTATE WITH GYRUS INSTRUMENTS;  Surgeon: Molli Hazard, MD;  Location: [facility];  Service: Urology;  Laterality: N/A;    Family History  Problem Relation Age of Onset  . Heart attack Mother   . Hypertension Mother     Social History:  reports that he has quit smoking. He has never used smokeless tobacco. He reports that he does not drink alcohol or use drugs.  Review of Systems:  HYPERTENSION: Controlled, only on low-dose atenolol This has been followed by PCP Also on Jardiance 10 mg  HYPERLIPIDEMIA:   Treated with high-dose simvastatin  with good control as of 9/18  Lab Results  Component Value Date   CHOL 132 11/18/2017   HDL 55.70 11/18/2017   LDLCALC 60 11/18/2017   TRIG 82.0 11/18/2017   CHOLHDL 2 11/18/2017   Last foot exam: 07/2017      Examination:   BP (!) 126/58 (BP Location: Left Arm, Patient Position: Sitting, Cuff Size: Normal)   Pulse 86   Ht _0  (1.753 m)   Wt 167 lb 6.4 oz (75.9 kg)   SpO2 98%   BMI 24.72 kg/m   Body mass  index is 24.72 kg/m.      ASSESSMENT/ PLAN:    Diabetes type 2 insulin Dependent  See history of present illness for detailed discussion of his current management, blood sugar patterns and problems identified   1C surprisingly improved at 7.3  His blood sugars are totally out of control with blood sugars averaging 350 This is markedly unusual for him since previously A1c was 7.5 He is also symptomatic with hyperglycemia  Part of his hypoglycemia is related to his difficulty remembering to take his insulin and may be getting to do that at least in the evening and may be in the morning also Also has had more stress with his wife and nursing home probably tending to eat out more in the evening  Clearly he is insulin deficient now Although unlikely that stopping Amaryl would cause hypoglycemia at this stage of his diabetes this may have happened before also on one occasion   Recommendations   Restart Amaryl  Check chemistry panel   Change insulin to TRESIBA since his fasting readings may be the highest of the day and unlikely to be getting any higher postprandial readings  Also will not be able to comply with the evening mixed insulin at suppertime; increasing the dose of premixed insulin may risk hypoglycemia overnight also  Since previously had not been able to manage the V-go pump on his own will not attempt this  His son was shown how to use the titration sheet given to him to adjust the TRESIBA based on fasting blood sugars every 3 to 5 days  We will start with 20  units and stop the 70/30 insulin for now  Continue Amaryl, Jardiance and metformin as long as renal function is normal  His son will call next week to give [place] his blood sugar readings for further adjustment  May consider consultation with diabetes educator also    Patient Instructions  Reduce insulin to 16 units daily in am  Check blood sugars on waking up  5/7 days  Also check blood sugars about 2 hours  after a meal and do this after different meals by rotation  Recommended blood sugar levels on waking up is 90-140 and about 2 hours after meal is 130-200  Please bring your blood sugar monitor to each visit, thank you      Elayne Snare 02/16/2018, 9:15 PM                     Patient ID: [name], male   DOB: [birth year], 84 y.o.   MRN: [id number]   Reason for Appointment: Diabetes follow-up   History of Present Illness   Diagnosis: Type 2 DIABETES MELITUS, diagnoses 1972   He has had long-standing diabetes and has been on basal bolus insulin regimen after failure of [name] hypoglycemic drugs over the last several years His blood sugars have been generally fairly well controlled but has a tendency to high postprandial readings Previously required only a small dose of 5 units of Levemir insulin  Taking Amaryl in addition to his insulin regimen  his blood sugars had improved his blood sugars during the daytime  He had taken Toujeo since 9/16 instead of twice a day Levemir  RECENT history:   Insulin regimen:  Novolog mix 70/30: 15 units at breakfast, 8 acs [name] hypoglycemic drugs: Metformin 1500 mg , Jardiance 10 mg daily   A1c is 11.4 compared to 7.5% previously   Current management, blood sugar patterns and problems identified  His blood sugars are markedly increased since his last visit  According to his son he is probably not taking his evening insulin consistently or sometimes after supper at night because he goes to the nursing home to visit his wife late afternoon  He did not take his insulin last night and today his blood sugar was 541  He has lost weight and also appears to be much more thirsty and getting more fatigued  His son thinks that he is also more stressed  AMARYL was stopped on his last visit while there medications were continued  He is checking only FASTING blood sugars and forgets to do readings later in the day  He has not changed  his diet and also on his fluid intake he is still continuing to use diet drinks or water    Side effects from medications: None          Proper timing of insulin in relation to meals: Yes.          Monitors blood glucose: <1 times a day.    Glucometer: One Touch ultra 2 .          Blood Glucose readings   .Mean values apply above for all meters except median for One Touch  PRE-MEAL Fasting Lunch Dinner Bedtime Overall  Glucose range:  157-541   ?   Mean/median:  349        PREVIOUS MEDIAN 161   Meals: 3 meals per day. Breakfast at 8 am Lunch 12 noon Supper at 5-6 pm; breakfast is usually oatmeal with eggs.  Drinks water mostly  and no sweet drinks            Physical activity: exercise: occasionally, bike      Wt Readings from Last 3 Encounters:  02/16/18 167 lb 6.4 oz (75.9 kg)  11/18/17 152 lb (68.9 kg)  08/19/17 160 lb (72.6 kg)   Lab Results  Component Value Date   HGBA1C 7.3 (A) 02/16/2018   HGBA1C 11.4 11/18/2017   HGBA1C 7.5 08/19/2017   Lab Results  Component Value Date   MICROALBUR 0.8 05/25/2017   LDLCALC 60 11/18/2017   CREATININE 1.24 11/18/2017     Allergies as of 02/16/2018      Reactions   Avodart [dutasteride] Swelling   Ibuprofen Nausea And Vomiting   Morphine Nausea And Vomiting      Medication List        Accurate as of 02/16/18  9:15 PM. Always use your most recent med list.          aspirin 81 MG tablet Take 81 mg by mouth daily.   atenolol 25 MG tablet Commonly known as:  TENORMIN Take 12.5-25 mg by mouth daily. 1 in the morning and one-half at bedtime   azelastine 0.1 % nasal spray Commonly known as:  ASTELIN Place 1 spray into both nostrils 2 (two) times daily. Use in each nostril as directed   cholecalciferol 1000 units tablet Commonly known as:  VITAMIN D Take 1,000 Units by mouth daily.   clotrimazole-betamethasone cream Commonly known as:  LOTRISONE Apply 1 application topically 2 (two) times daily.   docusate  sodium 100 MG capsule Commonly known as:  COLACE Take 100 mg by mouth 2 (two) times daily.   donepezil 5 MG tablet Commonly known as:  ARICEPT TAKE 1 TABLET BY MOUTH AT BEDTIME   empagliflozin 10 MG Tabs tablet Commonly known as:  JARDIANCE Take 10 mg by mouth daily with breakfast.   ferrous gluconate 325 MG tablet Commonly known as:  FERGON Take 325 mg by mouth daily with breakfast.   fluticasone 50 MCG/ACT nasal spray Commonly known as:  FLONASE PLACE 2 SPRAYS INTO BOTH NOSTRILS DAILY.   glimepiride 4 MG tablet Commonly known as:  AMARYL TAKE 1 TABLET BY MOUTH DAILY BEFORE SUPPER   glucose blood test strip Commonly known as:  ONETOUCH VERIO Use as instructed to check blood sugar twice a day Dx code E11.65   insulin degludec 100 UNIT/ML Sopn FlexTouch Pen Commonly known as:  TRESIBA FLEXTOUCH Inject 0.2 mLs (20 Units total) into the skin daily. Increase as directed   Insulin Pen Needle 31G X 5 MM Misc Commonly known as:  B-D UF III MINI PEN NEEDLES USE 4 TIMES PER DAY   B-D UF III MINI PEN NEEDLES 31G X 5 MM Misc Generic drug:  Insulin Pen Needle USE 4 TIMES PER DAY   metFORMIN 1000 MG tablet Commonly known as:  GLUCOPHAGE TAKE ONE TABLET IN AM AND 1/2 TABLET AT DINNERTIME   mometasone 0.1 % ointment Commonly known as:  ELOCON Apply topically daily.   omeprazole 20 MG capsule Commonly known as:  PRILOSEC Take 20 mg by mouth daily.   ONE TOUCH ULTRA 2 w/Device Kit Use to test blood sugar daily Dx code E11.65   simvastatin 80 MG tablet Commonly known as:  ZOCOR Take 40 mg by mouth at bedtime.       Allergies:  Allergies  Allergen Reactions  . Avodart [Dutasteride] Swelling  . Ibuprofen Nausea And Vomiting  . Morphine Nausea And Vomiting  Past Medical History:  Diagnosis Date  . B12 deficiency   . BPH (benign prostatic hypertrophy)   . Colon polyps   . Diabetes mellitus   . GERD (gastroesophageal reflux disease)    hiatal hernia  .  History of kidney stones   . Hyperlipidemia   . Hypertension   . Osteoarthritis     Past Surgical History:  Procedure Laterality Date  . APPENDECTOMY    . CARDIOVASCULAR STRESS TEST  09/02/2011   Post stress myocardial perfusion images show normal pattern of perfusion in all areas. No significant wall abnormalites noted.  Marland Kitchen CAROTID DOPPLER  01/27/2011   Right & Left ICAs 0-49% diameter reduction, right & left subclavian arteries demonstrated less than 50% diameter reduction.  . CYSTOSCOPY WITH BIOPSY  06/17/2012   Procedure: CYSTOSCOPY WITH BIOPSY;  Surgeon: Molli Hazard, MD;  Location: [facility];  Service: Urology;  Laterality: N/A;  . KNEE CARTILAGE SURGERY     Arthroscope  . Partial amputaion left foot    . TRANSTHORACIC ECHOCARDIOGRAM  09/02/2011   EF >55%, normal LV systolic function, mild diastolic dysfunction  . TRANSURETHRAL RESECTION OF PROSTATE  06/17/2012   Procedure: TRANSURETHRAL RESECTION OF THE PROSTATE WITH GYRUS INSTRUMENTS;  Surgeon: Molli Hazard, MD;  Location: [facility];  Service: Urology;  Laterality: N/A;    Family History  Problem Relation Age of Onset  . Heart attack Mother   . Hypertension Mother     Social History:  reports that he has quit smoking. He has never used smokeless tobacco. He reports that he does not drink alcohol or use drugs.  Review of Systems:  HYPERTENSION: Controlled, only on low-dose atenolol This has been followed by PCP Also on Jardiance 10 mg  HYPERLIPIDEMIA:   Treated with high-dose simvastatin  with good control as of 10/2017  Lab Results  Component Value Date   CHOL 132 11/18/2017   HDL 55.70 11/18/2017   LDLCALC 60 11/18/2017   TRIG 82.0 11/18/2017   CHOLHDL 2 11/18/2017   Last foot exam: 07/2017  He is scheduled for an upcoming eye exam      Examination:   BP (!) 126/58 (BP Location: Left Arm, Patient Position: Sitting, Cuff Size: Normal)   Pulse 86   Ht '5\' 9"'$  (1.753 m)   Wt 167 lb 6.4 oz (75.9 kg)    SpO2 98%   BMI 24.72 kg/m   Body mass index is 24.72 kg/m.     No ankle edema  ASSESSMENT/ PLAN:    Diabetes type 2 insulin Dependent  See history of present illness for detailed discussion of his current management, blood sugar patterns and problems identified  His blood sugars are much better with using [place] instead of 70/30 insulin which he was not always consistent with  Blood sugars are probably tending to be low normal and last month was having some hypoglycemia also overnight with his inconsistent food intake He does feel better and has gained back 15 pounds with improved blood sugar control  Recently has not checked his blood sugar as he ran out of test strips Since he is also taking Jardiance and Amaryl along with metformin may well be benefiting from a combination As usual postprandial readings are not being checked His son is helping him with day today management and reminding him to be compliant with various measures    Recommendations   REDUCE Tyler Aas down to 16 units  Check chemistry panel   To get reports of labs from  the [facility] that were done recently  More readings after meals  Continue [name] medications unchanged  Follow-up in 3 months    Patient Instructions  Reduce insulin to 16 units daily in am  Check blood sugars on waking up  5/7 days  Also check blood sugars about 2 hours after a meal and do this after different meals by rotation  Recommended blood sugar levels on waking up is 90-140 and about 2 hours after meal is 130-200  Please bring your blood sugar monitor to each visit, thank you      Elayne Snare 02/16/2018, 9:15 PM

## 2018-02-24 DIAGNOSIS — E119 Type 2 diabetes mellitus without complications: Secondary | ICD-10-CM | POA: Diagnosis not present

## 2018-02-24 DIAGNOSIS — Z961 Presence of intraocular lens: Secondary | ICD-10-CM | POA: Diagnosis not present

## 2018-02-24 DIAGNOSIS — H40013 Open angle with borderline findings, low risk, bilateral: Secondary | ICD-10-CM | POA: Diagnosis not present

## 2018-02-24 DIAGNOSIS — H35371 Puckering of macula, right eye: Secondary | ICD-10-CM | POA: Diagnosis not present

## 2018-02-24 DIAGNOSIS — H5021 Vertical strabismus, right eye: Secondary | ICD-10-CM | POA: Diagnosis not present

## 2018-02-24 LAB — HM DIABETES EYE EXAM

## 2018-03-05 ENCOUNTER — Encounter: Payer: Self-pay | Admitting: *Deleted

## 2018-03-05 ENCOUNTER — Other Ambulatory Visit: Payer: Self-pay | Admitting: Endocrinology

## 2018-03-23 ENCOUNTER — Ambulatory Visit: Payer: Medicare Other | Admitting: Podiatry

## 2018-04-02 ENCOUNTER — Encounter: Payer: Self-pay | Admitting: Podiatry

## 2018-04-02 ENCOUNTER — Ambulatory Visit (INDEPENDENT_AMBULATORY_CARE_PROVIDER_SITE_OTHER): Payer: Medicare Other | Admitting: Podiatry

## 2018-04-02 DIAGNOSIS — B351 Tinea unguium: Secondary | ICD-10-CM

## 2018-04-02 DIAGNOSIS — E0843 Diabetes mellitus due to underlying condition with diabetic autonomic (poly)neuropathy: Secondary | ICD-10-CM | POA: Diagnosis not present

## 2018-04-02 DIAGNOSIS — L989 Disorder of the skin and subcutaneous tissue, unspecified: Secondary | ICD-10-CM

## 2018-04-02 DIAGNOSIS — M79676 Pain in unspecified toe(s): Secondary | ICD-10-CM | POA: Diagnosis not present

## 2018-04-06 NOTE — Progress Notes (Signed)
    Subjective: Patient is a 84 y.o. male presenting to the office today with a chief complaint of a painful callus lesion of the left foot that has been present for several months. Applying pressure and bearing weight increases the pain. He has not done anything for treatment.   Patient also complains of elongated, thickened nails that cause pain while ambulating in shoes. He is unable to trim his own nails. Patient presents today for further treatment and evaluation.  Past Medical History:  Diagnosis Date  . B12 deficiency   . BPH (benign prostatic hypertrophy)   . Colon polyps   . Diabetes mellitus   . GERD (gastroesophageal reflux disease)    hiatal hernia  . History of kidney stones   . Hyperlipidemia   . Hypertension   . Osteoarthritis     Objective:  Physical Exam General: Alert and oriented x3 in no acute distress  Dermatology: Hyperkeratotic lesions present on the left foot x 1. Pain on palpation with a central nucleated core noted. Skin is warm, dry and supple bilateral lower extremities. Negative for open lesions or macerations. Nails are tender, long, thickened and dystrophic with subungual debris, consistent with onychomycosis, 1-5 bilateral. No signs of infection noted.  Vascular: Palpable pedal pulses bilaterally. No edema or erythema noted. Capillary refill within normal limits.  Neurological: Epicritic and protective threshold diminished bilaterally.   Musculoskeletal Exam: Pain on palpation at the keratotic lesion noted. Range of motion within normal limits bilateral. Muscle strength 5/5 in all groups bilateral.  Assessment: 1. Onychodystrophic nails 1-5 bilateral with hyperkeratosis of nails.  2. Onychomycosis of nail due to dermatophyte bilateral 3. Pre-ulcerative callus lesions noted to the left foot x 1  Plan of Care:  1. Patient evaluated. 2. Excisional debridement of keratoic lesion using a chisel blade was performed without incident.  3. Dressed with  light dressing. 4. Mechanical debridement of nails 1-5 bilaterally performed using a nail nipper. Filed with dremel without incident.  5. Patient is to return to the clinic in 3 months.   Edrick Kins, DPM [facility]  Dr. Edrick Kins, [address]                                        [place], [place] [address]                Office [phone]  Fax [phone]

## 2018-05-07 DIAGNOSIS — M25561 Pain in right knee: Secondary | ICD-10-CM | POA: Diagnosis not present

## 2018-05-07 DIAGNOSIS — M1711 Unilateral primary osteoarthritis, right knee: Secondary | ICD-10-CM | POA: Diagnosis not present

## 2018-06-01 ENCOUNTER — Ambulatory Visit: Payer: Medicare Other | Admitting: Endocrinology

## 2018-06-04 DIAGNOSIS — M25561 Pain in right knee: Secondary | ICD-10-CM | POA: Diagnosis not present

## 2018-06-04 DIAGNOSIS — M1711 Unilateral primary osteoarthritis, right knee: Secondary | ICD-10-CM | POA: Diagnosis not present

## 2018-06-08 NOTE — Progress Notes (Signed)
Patient ID: [name], male   DOB: 1928-08-04, 84 y.o.   MRN: [id number]   Reason for Appointment: Diabetes follow-up   History of Present Illness   Diagnosis: Type 2 DIABETES MELITUS, diagnoses 1972   He has had long-standing diabetes and has been on basal bolus insulin regimen after failure of oral hypoglycemic drugs over the last several years His blood sugars have been generally fairly well controlled but has a tendency to high postprandial readings Previously required only a small dose of 5 units of Levemir insulin  Taking Amaryl in addition to his insulin regimen  his blood sugars had improved his blood sugars during the daytime  He had taken Toujeo since 9/16 instead of twice a day Levemir  RECENT history:   Insulin regimen:  TRESIBA 16 units at breakfast Oral hypoglycemic drugs: Metformin 1500 mg , Jardiance 10 mg daily   A1c is further improved at 6.8, previously 7.3  Current management, blood sugar patterns and problems identified  He has check blood sugars only in the mornings despite reminders to do readings after meals also  He is taking his [place] Aas consistently in the morning according to his son  He was having excellent blood sugars in the mornings last month but after 10/28 blood sugar started going up significantly  His son thinks that this may be because of recent problem with his knee joint at being much less active  Glucose in the office today before lunch was still high at 211 compared to 233 this morning  Has been able to get Jardiance and metformin consistently and has no side effects  He has only rare hypoglycemia and usually can detect symptoms of this, lowest reading was 68 on 10/23    Side effects from medications: None          Proper timing of insulin in relation to meals: Yes.          Monitors blood glucose: <1 times a day.    Glucometer: One Touch ultra 2 .          Blood Glucose readings   PRE-MEAL Fasting Lunch Dinner  Bedtime Overall  Glucose range:  68-233      Mean/median:  127    127   POST-MEAL PC Breakfast PC Lunch PC Dinner  Glucose range:   ?  Mean/median:      Previously:  Mean values apply above for all meters except median for One Touch  PRE-MEAL Fasting Lunch Dinner  overnight Overall  Glucose range:  89-407    49-98   Mean/median:  140     100     Meals: 3 meals per day. Breakfast at 8 am Lunch 12 noon Supper at 5-6 pm; breakfast is usually oatmeal with eggs.  Drinks water mostly and no sweet drinks            Physical activity: exercise: None recently otherwise doing mostly yard work and walking around [employer] Readings from Last 3 Encounters:  06/09/18 174 lb (78.9 kg)  02/16/18 167 lb 6.4 oz (75.9 kg)  11/18/17 152 lb (68.9 kg)   Lab Results  Component Value Date   HGBA1C 6.8 (A) 06/09/2018   HGBA1C 7.3 (A) 02/16/2018   HGBA1C 11.4 11/18/2017   Lab Results  Component Value Date   MICROALBUR 6.0 (H) 06/09/2018   LDLCALC 60 11/18/2017   CREATININE 1.17 06/09/2018     Allergies as of 06/09/2018  Reactions   Avodart [dutasteride] Swelling   Ibuprofen Nausea And Vomiting   Morphine Nausea And Vomiting      Medication List        Accurate as of 06/09/18  9:02 PM. Always use your most recent med list.          aspirin 81 MG tablet Take 81 mg by mouth daily.   atenolol 25 MG tablet Commonly known as:  TENORMIN Take 12.5-25 mg by mouth daily. 1 in the morning and one-half at bedtime   azelastine 0.1 % nasal spray Commonly known as:  ASTELIN Place 1 spray into both nostrils 2 (two) times daily. Use in each nostril as directed   cholecalciferol 1000 units tablet Commonly known as:  VITAMIN D Take 1,000 Units by mouth daily.   clotrimazole-betamethasone cream Commonly known as:  LOTRISONE Apply 1 application topically 2 (two) times daily.   docusate sodium 100 MG capsule Commonly known as:  COLACE Take 100 mg by mouth 2 (two) times  daily.   donepezil 5 MG tablet Commonly known as:  ARICEPT TAKE 1 TABLET BY MOUTH AT BEDTIME   empagliflozin 10 MG Tabs tablet Commonly known as:  JARDIANCE Take 10 mg by mouth daily with breakfast.   ferrous gluconate 325 MG tablet Commonly known as:  FERGON Take 325 mg by mouth daily with breakfast.   fluticasone 50 MCG/ACT nasal spray Commonly known as:  FLONASE PLACE 2 SPRAYS INTO BOTH NOSTRILS DAILY.   glimepiride 4 MG tablet Commonly known as:  AMARYL TAKE 1 TABLET BY MOUTH DAILY BEFORE SUPPER   glucose blood test strip Use as instructed to check blood sugar twice a day Dx code E11.65   Insulin Pen Needle 31G X 5 MM Misc USE 4 TIMES PER DAY   B-D UF III MINI PEN NEEDLES 31G X 5 MM Misc Generic drug:  Insulin Pen Needle USE 4 TIMES PER DAY   metFORMIN 1000 MG tablet Commonly known as:  GLUCOPHAGE TAKE ONE TABLET IN AM AND 1/2 TABLET AT DINNERTIME   mometasone 0.1 % ointment Commonly known as:  ELOCON Apply topically daily.   omeprazole 20 MG capsule Commonly known as:  PRILOSEC Take 20 mg by mouth daily.   ONE TOUCH ULTRA 2 w/Device Kit Use to test blood sugar daily Dx code E11.65   simvastatin 80 MG tablet Commonly known as:  ZOCOR Take 40 mg by mouth at bedtime.   TRESIBA FLEXTOUCH 100 UNIT/ML Sopn FlexTouch Pen Generic drug:  insulin degludec INJECT 20 UNITS TOTAL INTO THE SKIN DAILY. INCREASE AS DIRECTED       Allergies:  Allergies  Allergen Reactions  . Avodart [Dutasteride] Swelling  . Ibuprofen Nausea And Vomiting  . Morphine Nausea And Vomiting    Past Medical History:  Diagnosis Date  . B12 deficiency   . BPH (benign prostatic hypertrophy)   . Colon polyps   . Diabetes mellitus   . GERD (gastroesophageal reflux disease)    hiatal hernia  . History of kidney stones   . Hyperlipidemia   . Hypertension   . Osteoarthritis     Past Surgical History:  Procedure Laterality Date  . APPENDECTOMY    . CARDIOVASCULAR STRESS TEST   09/02/2011   Post stress myocardial perfusion images show normal pattern of perfusion in all areas. No significant wall abnormalites noted.  Marland Kitchen CAROTID DOPPLER  01/27/2011   Right & Left ICAs 0-49% diameter reduction, right & left subclavian arteries demonstrated less than 50% diameter reduction.  Marland Kitchen  CYSTOSCOPY WITH BIOPSY  06/17/2012   Procedure: CYSTOSCOPY WITH BIOPSY;  Surgeon: Molli Hazard, MD;  Location: [facility];  Service: Urology;  Laterality: N/A;  . KNEE CARTILAGE SURGERY     Arthroscope  . Partial amputaion left foot    . TRANSTHORACIC ECHOCARDIOGRAM  09/02/2011   EF >55%, normal LV systolic function, mild diastolic dysfunction  . TRANSURETHRAL RESECTION OF PROSTATE  06/17/2012   Procedure: TRANSURETHRAL RESECTION OF THE PROSTATE WITH GYRUS INSTRUMENTS;  Surgeon: Molli Hazard, MD;  Location: [facility];  Service: Urology;  Laterality: N/A;    Family History  Problem Relation Age of Onset  . Heart attack Mother   . Hypertension Mother     Social History:  reports that he has quit smoking. He has never used smokeless tobacco. He reports that he does not drink alcohol or use drugs.  Review of Systems:  HYPERTENSION: Controlled, only on low-dose atenolol This has been followed by PCP Also on Jardiance 10 mg  HYPERLIPIDEMIA:   Treated with high-dose simvastatin  with good control as of 9/18  Lab Results  Component Value Date   CHOL 132 11/18/2017   HDL 55.70 11/18/2017   LDLCALC 60 11/18/2017   TRIG 82.0 11/18/2017   CHOLHDL 2 11/18/2017   Last foot exam: 07/2017      Examination:   BP 126/62   Pulse 64   Ht 5' 9"  (1.753 m)   Wt 174 lb (78.9 kg)   SpO2 96%   BMI 25.70 kg/m   Body mass index is 25.7 kg/m.      ASSESSMENT/ PLAN:    Diabetes type 2 insulin Dependent  See history of present illness for detailed discussion of his current management, blood sugar patterns and problems identified   1C surprisingly improved at 7.3  His blood sugars are  totally out of control with blood sugars averaging 350 This is markedly unusual for him since previously A1c was 7.5 He is also symptomatic with hyperglycemia  Part of his hypoglycemia is related to his difficulty remembering to take his insulin and may be getting to do that at least in the evening and may be in the morning also Also has had more stress with his wife and nursing home probably tending to eat out more in the evening  Clearly he is insulin deficient now Although unlikely that stopping Amaryl would cause hypoglycemia at this stage of his diabetes this may have happened before also on one occasion   Recommendations   Restart Amaryl  Check chemistry panel   Change insulin to TRESIBA since his fasting readings may be the highest of the day and unlikely to be getting any higher postprandial readings  Also will not be able to comply with the evening mixed insulin at suppertime; increasing the dose of premixed insulin may risk hypoglycemia overnight also  Since previously had not been able to manage the V-go pump on his own will not attempt this  His son was shown how to use the titration sheet given to him to adjust the [facility] based on fasting blood sugars every 3 to 5 days  We will start with 20 units and stop the 70/30 insulin for now  Continue Amaryl, Jardiance and metformin as long as renal function is normal  His son will call next week to give [place] his blood sugar readings for further adjustment  May consider consultation with diabetes educator also    Patient Instructions  Check blood sugars on waking up 4  days a week  Also check blood sugars about 2 hours after meals and do this after different meals by rotation  Recommended blood sugar levels on waking up are 90-130 and about 2 hours after meal is 130-160  Please bring your blood sugar monitor to each visit, thank you       Elayne Snare 06/09/2018, 9:02 PM                     Patient ID:  [name], male   DOB: April 12, 1929, 84 y.o.   MRN: [id number]   Reason for Appointment: Diabetes follow-up   History of Present Illness   Diagnosis: Type 2 DIABETES MELITUS, diagnoses 1972   He has had long-standing diabetes and has been on basal bolus insulin regimen after failure of oral hypoglycemic drugs over the last several years His blood sugars have been generally fairly well controlled but has a tendency to high postprandial readings Previously required only a small dose of 5 units of Levemir insulin  Taking Amaryl in addition to his insulin regimen  his blood sugars had improved his blood sugars during the daytime  He had taken Toujeo since 9/16 instead of twice a day Levemir  RECENT history:   Insulin regimen:  Novolog mix 70/30: 15 units at breakfast, 8 acs Oral hypoglycemic drugs: Metformin 1500 mg , Jardiance 10 mg daily   A1c is 11.4 compared to 7.5% previously   Current management, blood sugar patterns and problems identified  His blood sugars are markedly increased since his last visit  According to his son he is probably not taking his evening insulin consistently or sometimes after supper at night because he goes to the nursing home to visit his wife late afternoon  He did not take his insulin last night and today his blood sugar was 541  He has lost weight and also appears to be much more thirsty and getting more fatigued  His son thinks that he is also more stressed  AMARYL was stopped on his last visit while there medications were continued  He is checking only FASTING blood sugars and forgets to do readings later in the day  He has not changed his diet and also on his fluid intake he is still continuing to use diet drinks or water    Side effects from medications: None          Proper timing of insulin in relation to meals: Yes.          Monitors blood glucose: <1 times a day.    Glucometer: One Touch ultra 2 .          Blood Glucose  readings   .Mean values apply above for all meters except median for One Touch  PRE-MEAL Fasting Lunch Dinner Bedtime Overall  Glucose range:  157-541   ?   Mean/median:  349        PREVIOUS MEDIAN 161   Meals: 3 meals per day. Breakfast at 8 am Lunch 12 noon Supper at 5-6 pm; breakfast is usually oatmeal with eggs.  Drinks water mostly and no sweet drinks            Physical activity: exercise: occasionally, bike      Wt Readings from Last 3 Encounters:  06/09/18 174 lb (78.9 kg)  02/16/18 167 lb 6.4 oz (75.9 kg)  11/18/17 152 lb (68.9 kg)   Lab Results  Component Value Date   HGBA1C 6.8 (A) 06/09/2018  HGBA1C 7.3 (A) 02/16/2018   HGBA1C 11.4 11/18/2017   Lab Results  Component Value Date   MICROALBUR 6.0 (H) 06/09/2018   LDLCALC 60 11/18/2017   CREATININE 1.17 06/09/2018     Allergies as of 06/09/2018      Reactions   Avodart [dutasteride] Swelling   Ibuprofen Nausea And Vomiting   Morphine Nausea And Vomiting      Medication List        Accurate as of 06/09/18  9:02 PM. Always use your most recent med list.          aspirin 81 MG tablet Take 81 mg by mouth daily.   atenolol 25 MG tablet Commonly known as:  TENORMIN Take 12.5-25 mg by mouth daily. 1 in the morning and one-half at bedtime   azelastine 0.1 % nasal spray Commonly known as:  ASTELIN Place 1 spray into both nostrils 2 (two) times daily. Use in each nostril as directed   cholecalciferol 1000 units tablet Commonly known as:  VITAMIN D Take 1,000 Units by mouth daily.   clotrimazole-betamethasone cream Commonly known as:  LOTRISONE Apply 1 application topically 2 (two) times daily.   docusate sodium 100 MG capsule Commonly known as:  COLACE Take 100 mg by mouth 2 (two) times daily.   donepezil 5 MG tablet Commonly known as:  ARICEPT TAKE 1 TABLET BY MOUTH AT BEDTIME   empagliflozin 10 MG Tabs tablet Commonly known as:  JARDIANCE Take 10 mg by mouth daily with breakfast.     ferrous gluconate 325 MG tablet Commonly known as:  FERGON Take 325 mg by mouth daily with breakfast.   fluticasone 50 MCG/ACT nasal spray Commonly known as:  FLONASE PLACE 2 SPRAYS INTO BOTH NOSTRILS DAILY.   glimepiride 4 MG tablet Commonly known as:  AMARYL TAKE 1 TABLET BY MOUTH DAILY BEFORE SUPPER   glucose blood test strip Use as instructed to check blood sugar twice a day Dx code E11.65   Insulin Pen Needle 31G X 5 MM Misc USE 4 TIMES PER DAY   B-D UF III MINI PEN NEEDLES 31G X 5 MM Misc Generic drug:  Insulin Pen Needle USE 4 TIMES PER DAY   metFORMIN 1000 MG tablet Commonly known as:  GLUCOPHAGE TAKE ONE TABLET IN AM AND 1/2 TABLET AT DINNERTIME   mometasone 0.1 % ointment Commonly known as:  ELOCON Apply topically daily.   omeprazole 20 MG capsule Commonly known as:  PRILOSEC Take 20 mg by mouth daily.   ONE TOUCH ULTRA 2 w/Device Kit Use to test blood sugar daily Dx code E11.65   simvastatin 80 MG tablet Commonly known as:  ZOCOR Take 40 mg by mouth at bedtime.   TRESIBA FLEXTOUCH 100 UNIT/ML Sopn FlexTouch Pen Generic drug:  insulin degludec INJECT 20 UNITS TOTAL INTO THE SKIN DAILY. INCREASE AS DIRECTED       Allergies:  Allergies  Allergen Reactions  . Avodart [Dutasteride] Swelling  . Ibuprofen Nausea And Vomiting  . Morphine Nausea And Vomiting    Past Medical History:  Diagnosis Date  . B12 deficiency   . BPH (benign prostatic hypertrophy)   . Colon polyps   . Diabetes mellitus   . GERD (gastroesophageal reflux disease)    hiatal hernia  . History of kidney stones   . Hyperlipidemia   . Hypertension   . Osteoarthritis     Past Surgical History:  Procedure Laterality Date  . APPENDECTOMY    . CARDIOVASCULAR STRESS TEST  09/02/2011  Post stress myocardial perfusion images show normal pattern of perfusion in all areas. No significant wall abnormalites noted.  Marland Kitchen CAROTID DOPPLER  01/27/2011   Right & Left ICAs 0-49% diameter  reduction, right & left subclavian arteries demonstrated less than 50% diameter reduction.  . CYSTOSCOPY WITH BIOPSY  06/17/2012   Procedure: CYSTOSCOPY WITH BIOPSY;  Surgeon: Molli Hazard, MD;  Location: [facility];  Service: Urology;  Laterality: N/A;  . KNEE CARTILAGE SURGERY     Arthroscope  . Partial amputaion left foot    . TRANSTHORACIC ECHOCARDIOGRAM  09/02/2011   EF >55%, normal LV systolic function, mild diastolic dysfunction  . TRANSURETHRAL RESECTION OF PROSTATE  06/17/2012   Procedure: TRANSURETHRAL RESECTION OF THE PROSTATE WITH GYRUS INSTRUMENTS;  Surgeon: Molli Hazard, MD;  Location: [facility];  Service: Urology;  Laterality: N/A;    Family History  Problem Relation Age of Onset  . Heart attack Mother   . Hypertension Mother     Social History:  reports that he has quit smoking. He has never used smokeless tobacco. He reports that he does not drink alcohol or use drugs.  Review of Systems:  HYPERTENSION: Controlled, only on low-dose atenolol, followed by PCP Also on Jardiance 10 mg  HYPERLIPIDEMIA:   Treated with 80 mg simvastatin by his PCP with good control as of 10/2017  Lab Results  Component Value Date   CHOL 132 11/18/2017   HDL 55.70 11/18/2017   LDLCALC 60 11/18/2017   TRIG 82.0 11/18/2017   CHOLHDL 2 11/18/2017   Last foot exam: 07/2017     Examination:   BP 126/62   Pulse 64   Ht 5' 9"  (1.753 m)   Wt 174 lb (78.9 kg)   SpO2 96%   BMI 25.70 kg/m   Body mass index is 25.7 kg/m.   He is fairly alert and conversant today  ASSESSMENT/ PLAN:    Diabetes type 2 insulin Dependent  See history of present illness for detailed discussion of his current management, blood sugar patterns and problems identified  His A1c is excellent at 6.8 without any hypoglycemia  This is with basal insulin, metformin and Jardiance  Previously had lost weight with hyperglycemia and decreased intake but now he is progressively gaining back the weight he  had lost; however his BMI still reasonably good at about 25+  The last few days he is having higher blood sugars from being less active Previously fasting readings were excellent with only one low normal reading of 68    Recommendations   No change in [place] or Jardiance  Check labs today  Reminded him to check readings after meals  If he starts getting low normal readings once he is more active after treating his knee pain may need to reduce insulin further  Follow-up in 3 months  Influenza vaccine given  Patient Instructions  Check blood sugars on waking up 4 days a week  Also check blood sugars about 2 hours after meals and do this after different meals by rotation  Recommended blood sugar levels on waking up are 90-130 and about 2 hours after meal is 130-160  Please bring your blood sugar monitor to each visit, thank you       Elayne Snare 06/09/2018, 9:02 PM

## 2018-06-09 ENCOUNTER — Encounter: Payer: Self-pay | Admitting: Endocrinology

## 2018-06-09 ENCOUNTER — Ambulatory Visit (INDEPENDENT_AMBULATORY_CARE_PROVIDER_SITE_OTHER): Payer: Medicare Other | Admitting: Endocrinology

## 2018-06-09 VITALS — BP 126/62 | HR 64 | Ht 69.0 in | Wt 174.0 lb

## 2018-06-09 DIAGNOSIS — E1165 Type 2 diabetes mellitus with hyperglycemia: Secondary | ICD-10-CM | POA: Diagnosis not present

## 2018-06-09 DIAGNOSIS — Z23 Encounter for immunization: Secondary | ICD-10-CM | POA: Diagnosis not present

## 2018-06-09 DIAGNOSIS — Z794 Long term (current) use of insulin: Secondary | ICD-10-CM

## 2018-06-09 LAB — GLUCOSE, POCT (MANUAL RESULT ENTRY): POC Glucose: 211 mg/dl — AB (ref 70–99)

## 2018-06-09 LAB — URINALYSIS, ROUTINE W REFLEX MICROSCOPIC
Bilirubin Urine: NEGATIVE
Ketones, ur: NEGATIVE
LEUKOCYTES UA: NEGATIVE
NITRITE: NEGATIVE
SPECIFIC GRAVITY, URINE: 1.01 (ref 1.000–1.030)
Total Protein, Urine: NEGATIVE
Urobilinogen, UA: 0.2 (ref 0.0–1.0)
WBC, UA: NONE SEEN (ref 0–?)
pH: 6 (ref 5.0–8.0)

## 2018-06-09 LAB — COMPREHENSIVE METABOLIC PANEL
ALK PHOS: 104 U/L (ref 39–117)
ALT: 16 U/L (ref 0–53)
AST: 18 U/L (ref 0–37)
Albumin: 4.4 g/dL (ref 3.5–5.2)
BILIRUBIN TOTAL: 0.5 mg/dL (ref 0.2–1.2)
BUN: 28 mg/dL — AB (ref 6–23)
CO2: 32 mEq/L (ref 19–32)
CREATININE: 1.17 mg/dL (ref 0.40–1.50)
Calcium: 9.5 mg/dL (ref 8.4–10.5)
Chloride: 104 mEq/L (ref 96–112)
GFR: 62.66 mL/min (ref >60.00)
GLUCOSE: 164 mg/dL — AB (ref 70–99)
Potassium: 4.2 mEq/L (ref 3.5–5.1)
SODIUM: 142 meq/L (ref 135–145)
TOTAL PROTEIN: 7.1 g/dL (ref 6.0–8.3)

## 2018-06-09 LAB — POCT GLYCOSYLATED HEMOGLOBIN (HGB A1C): HEMOGLOBIN A1C: 6.8 % — AB (ref 4.0–5.6)

## 2018-06-09 LAB — MICROALBUMIN / CREATININE URINE RATIO
Creatinine,U: 42.4 mg/dL
Microalb Creat Ratio: 14.2 mg/g (ref 0.0–30.0)
Microalb, Ur: 6 mg/dL — ABNORMAL HIGH (ref 0.0–1.9)

## 2018-06-09 NOTE — Patient Instructions (Signed)
Check blood sugars on waking up 4  days a week  Also check blood sugars about 2 hours after meals and do this after different meals by rotation  Recommended blood sugar levels on waking up are 90-130 and about 2 hours after meal is 130-160  Please bring your blood sugar monitor to each visit, thank you  

## 2018-06-10 DIAGNOSIS — M1711 Unilateral primary osteoarthritis, right knee: Secondary | ICD-10-CM | POA: Diagnosis not present

## 2018-06-11 ENCOUNTER — Other Ambulatory Visit: Payer: Self-pay | Admitting: Endocrinology

## 2018-06-15 ENCOUNTER — Other Ambulatory Visit: Payer: Self-pay | Admitting: Endocrinology

## 2018-06-18 DIAGNOSIS — M1711 Unilateral primary osteoarthritis, right knee: Secondary | ICD-10-CM | POA: Diagnosis not present

## 2018-07-12 ENCOUNTER — Ambulatory Visit (INDEPENDENT_AMBULATORY_CARE_PROVIDER_SITE_OTHER): Payer: Medicare Other | Admitting: Podiatry

## 2018-07-12 ENCOUNTER — Encounter: Payer: Self-pay | Admitting: Podiatry

## 2018-07-12 DIAGNOSIS — L989 Disorder of the skin and subcutaneous tissue, unspecified: Secondary | ICD-10-CM | POA: Diagnosis not present

## 2018-07-12 DIAGNOSIS — M79676 Pain in unspecified toe(s): Secondary | ICD-10-CM | POA: Diagnosis not present

## 2018-07-12 DIAGNOSIS — E0843 Diabetes mellitus due to underlying condition with diabetic autonomic (poly)neuropathy: Secondary | ICD-10-CM | POA: Diagnosis not present

## 2018-07-12 DIAGNOSIS — B351 Tinea unguium: Secondary | ICD-10-CM

## 2018-07-12 NOTE — Progress Notes (Addendum)
Patient ID: [name], male   DOB: [birth year], 84 y.o.   MRN: [id number] Complaint:  Visit Type: Patient returns to my office for continued preventative foot care services. Complaint: Patient states" my nails have grown long and thick and become painful to walk and wear shoes" Patient has been diagnosed with DM with no foot complications. The patient presents for preventative foot care services. No changes to ROS.  He has history 1,2 toes left foot secondary to trauma.  Podiatric Exam: Vascular: dorsalis pedis and posterior tibial pulses are palpable bilateral. Capillary return is immediate. Temperature gradient is WNL. Skin turgor WNL  Sensorium: Normal Semmes Weinstein monofilament test. Normal tactile sensation bilaterally. Nail Exam: Pt has thick disfigured discolored nails with subungual debris noted bilateral entire nail hallux through fifth toenails Ulcer Exam: There is no evidence of ulcer or pre-ulcerative changes or infection. Orthopedic Exam: Muscle tone and strength are WNL. No limitations in general ROM. No crepitus or effusions noted. Foot type and digits show no abnormalities. Bony prominences are unremarkable. Skin:  Porokeratosis   plantar aspect left hallux. . No infection or ulcers  Diagnosis:  Onychomycosis, , Pain in right toe, pain in left toes,  Porokeratosis left hallux.  Treatment & Plan Procedures and Treatment: Consent by patient was obtained for treatment procedures. The patient understood the discussion of treatment and procedures well. All questions were answered thoroughly reviewed. Debridement of mycotic and hypertrophic toenails, 1 through 5 bilateral and clearing of subungual debris. No ulceration, no infection noted. Debride callus left hallux. ABN signed for 2019. Return Visit-Office Procedure: Patient instructed to return to the office for a follow up visit 3 months for continued evaluation and treatment.    Gardiner Barefoot DPM

## 2018-07-13 ENCOUNTER — Encounter: Payer: Self-pay | Admitting: Family Medicine

## 2018-07-13 ENCOUNTER — Ambulatory Visit (INDEPENDENT_AMBULATORY_CARE_PROVIDER_SITE_OTHER): Payer: Medicare Other | Admitting: Family Medicine

## 2018-07-13 VITALS — BP 122/64 | HR 58 | Temp 97.9°F | Resp 12 | Ht 69.0 in | Wt 177.0 lb

## 2018-07-13 DIAGNOSIS — G8929 Other chronic pain: Secondary | ICD-10-CM

## 2018-07-13 DIAGNOSIS — M25561 Pain in right knee: Secondary | ICD-10-CM | POA: Diagnosis not present

## 2018-07-13 DIAGNOSIS — R413 Other amnesia: Secondary | ICD-10-CM

## 2018-07-13 MED ORDER — DICLOFENAC SODIUM 1 % TD GEL: 2.0000 g | Freq: Four times a day (QID) | TRANSDERMAL | 1 refills | Status: DC

## 2018-07-13 MED ORDER — DONEPEZIL HCL 10 MG PO TABS: 10.0000 mg | ORAL_TABLET | Freq: Every day | ORAL | 1 refills | Status: DC

## 2018-07-13 NOTE — Progress Notes (Signed)
Subjective:    Patient ID: [name], male    DOB: Oct 14, 1928, 84 y.o.   MRN: [id number]  HPI Patient is a very pleasant 84 year old Caucasian male here today with his son.  I have not seen the patient in more than a year.  Since I last saw the patient, his wife died due to complications from Alzheimer's disease.  His memory loss is also steadily worsened.  His son states that he is falling more at home.  Recently he fell trying to get wood from the backyard for his home.  His son manages his affairs and lays out his medication in a pillbox to ensure that he is taking it properly however the patient still lives independently.  Family members check on him daily.  He is no longer driving.  However he increasingly is more forgetful.  He has no recollection of the last time he saw me.  He states that the pain in his knees began 2 years ago.  His son states that he has been dealing with severe pain in his knees for more than 5 years.  He has no recollection of the orthopedist giving him Visco supplementation injections recently.  Patient is currently only on 5 mg of Aricept and the son is curious if we can increase that medication to help delay the progression of the dementia further.  He is also curious if we can give him something stronger for pain.  He is taking 1200 mg of Tylenol daily to manage the pain in his right knee.  He has had Visco supplementation injections x3 in the right knee yet the pain is severe.  We had a long discussion today regarding the risk of narcotic pain medication including increasing confusion, delirium, sedation, and increasing fall risk.  Therefore the son does not want to give this medication to him because his dementia is already worsening Past Medical History:  Diagnosis Date  . B12 deficiency   . BPH (benign prostatic hypertrophy)   . Colon polyps   . Diabetes mellitus   . GERD (gastroesophageal reflux disease)    hiatal hernia  . History of kidney stones   .  Hyperlipidemia   . Hypertension   . Osteoarthritis    Past Surgical History:  Procedure Laterality Date  . APPENDECTOMY    . CARDIOVASCULAR STRESS TEST  09/02/2011   Post stress myocardial perfusion images show normal pattern of perfusion in all areas. No significant wall abnormalites noted.  Marland Kitchen CAROTID DOPPLER  01/27/2011   Right & Left ICAs 0-49% diameter reduction, right & left subclavian arteries demonstrated less than 50% diameter reduction.  . CYSTOSCOPY WITH BIOPSY  06/17/2012   Procedure: CYSTOSCOPY WITH BIOPSY;  Surgeon: Molli Hazard, MD;  Location: [facility];  Service: Urology;  Laterality: N/A;  . KNEE CARTILAGE SURGERY     Arthroscope  . Partial amputaion left foot    . TRANSTHORACIC ECHOCARDIOGRAM  09/02/2011   EF >55%, normal LV systolic function, mild diastolic dysfunction  . TRANSURETHRAL RESECTION OF PROSTATE  06/17/2012   Procedure: TRANSURETHRAL RESECTION OF THE PROSTATE WITH GYRUS INSTRUMENTS;  Surgeon: Molli Hazard, MD;  Location: [facility];  Service: Urology;  Laterality: N/A;   Current Outpatient Medications on File Prior to Visit  Medication Sig Dispense Refill  . aspirin 81 MG tablet Take 81 mg by mouth daily.    Marland Kitchen atenolol (TENORMIN) 25 MG tablet Take 12.5-25 mg by mouth daily. 1 in the morning and one-half at bedtime    .  azelastine (ASTELIN) 0.1 % nasal spray Place 1 spray into both nostrils 2 (two) times daily. Use in each nostril as directed 30 mL 3  . B-D UF III MINI PEN NEEDLES 31G X 5 MM MISC USE 4 TIMES PER DAY 100 each 2  . Blood Glucose Monitoring Suppl (ONE TOUCH ULTRA 2) w/Device KIT Use to test blood sugar daily Dx code E11.65 1 each 1  . cholecalciferol (VITAMIN D) 1000 UNITS tablet Take 1,000 Units by mouth daily.    . clotrimazole-betamethasone (LOTRISONE) cream clotrimazole-betamethasone 1 %-0.05 % topical cream    . docusate sodium (COLACE) 100 MG capsule Take 100 mg by mouth 2 (two) times daily.    Marland Kitchen donepezil (ARICEPT) 5 MG tablet  TAKE 1 TABLET BY MOUTH AT BEDTIME 30 tablet 11  . empagliflozin (JARDIANCE) 10 MG TABS tablet Take 10 mg by mouth daily with breakfast. 90 tablet 3  . ferrous gluconate (FERGON) 325 MG tablet Take 325 mg by mouth daily with breakfast.    . fluticasone (FLONASE) 50 MCG/ACT nasal spray PLACE 2 SPRAYS INTO BOTH NOSTRILS DAILY. 48 g 5  . glimepiride (AMARYL) 4 MG tablet TAKE 1 TABLET BY MOUTH DAILY BEFORE SUPPER 90 tablet 1  . glucose blood (ONETOUCH VERIO) test strip Use as instructed to check blood sugar twice a day Dx code E11.65 100 each 12  . insulin degludec (TRESIBA FLEXTOUCH) 100 UNIT/ML SOPN FlexTouch Pen Tresiba FlexTouch U-100 insulin 100 unit/mL (3 mL) subcutaneous pen    . Insulin Pen Needle (B-D UF III MINI PEN NEEDLES) 31G X 5 MM MISC USE 4 TIMES PER DAY 125 each 3  . metFORMIN (GLUCOPHAGE) 1000 MG tablet metformin 1,000 mg tablet    . mometasone (ELOCON) 0.1 % ointment Apply topically daily. 45 g 0  . omeprazole (PRILOSEC) 20 MG capsule Take 20 mg by mouth daily.      . simvastatin (ZOCOR) 80 MG tablet Take 40 mg by mouth at bedtime.     Current Facility-Administered Medications on File Prior to Visit  Medication Dose Route Frequency Provider Last Rate Last Dose  . cyanocobalamin ((VITAMIN B-12)) injection 1,000 mcg  1,000 mcg Intramuscular Q30 days Susy Frizzle, MD   1,000 mcg at 04/09/17 1454   Allergies  Allergen Reactions  . Avodart [Dutasteride] Swelling  . Ibuprofen Nausea And Vomiting  . Morphine Nausea And Vomiting   Social History   Socioeconomic History  . Marital status: Married    Spouse name: Not on file  . Number of children: Not on file  . Years of education: Not on file  . Highest education level: Not on file  Occupational History  . Not on file  Social Needs  . Financial resource strain: Not on file  . Food insecurity:    Worry: Not on file    Inability: Not on file  . Transportation needs:    Medical: Not on file    Non-medical: Not on file   Tobacco Use  . Smoking status: Former Research scientist (life sciences)  . Smokeless tobacco: Never Used  Substance and Sexual Activity  . Alcohol use: No    Alcohol/week: 0.0 standard drinks  . Drug use: No  . Sexual activity: Not on file    Comment: married to Blyn, retired.  Lifestyle  . Physical activity:    Days per week: Not on file    Minutes per session: Not on file  . Stress: Not on file  Relationships  . Social connections:    Talks  on phone: Not on file    Gets together: Not on file    Attends religious service: Not on file    Active member of club or organization: Not on file    Attends meetings of clubs or organizations: Not on file    Relationship status: Not on file  . Intimate partner violence:    Fear of current or ex partner: Not on file    Emotionally abused: Not on file    Physically abused: Not on file    Forced sexual activity: Not on file  Other Topics Concern  . Not on file  Social History Narrative  . Not on file   Family History  Problem Relation Age of Onset  . Heart attack Mother   . Hypertension Mother       Review of Systems  All other systems reviewed and are negative.      Objective:   Physical Exam  Constitutional: He is oriented to person, place, and time. He appears well-developed and well-nourished. No distress.  Cardiovascular: Normal rate, regular rhythm, normal heart sounds and intact distal pulses. Exam reveals no gallop and no friction rub.  No murmur heard. Pulmonary/Chest: Effort normal and breath sounds normal. No respiratory distress. He has no wheezes. He has no rales. He exhibits no tenderness.  Abdominal: Soft. Bowel sounds are normal.  Musculoskeletal:        General: Tenderness present.     Right knee: He exhibits decreased range of motion. Tenderness found. Medial joint line and lateral joint line tenderness noted.  Neurological: He is alert and oriented to person, place, and time.  Skin: He is not diaphoretic.  Psychiatric: Judgment  normal.  Vitals reviewed.  Slow shuffling gait.  Decreased arm swing.  Antalgic gait due to right knee pain.  Resting tremor that is with activity in both hands.  Patient appears frail and unsteady on his feet       Assessment & Plan:  Memory loss/dementia, chronic pain in the right knee  Avoid opiates for knee pain due to concern over worsening confusion, and increased fall risk.  Instead use Voltaren gel 2 g 4 times daily in addition to Tylenol thousand milligrams twice daily.  Increase Aricept to 10 mg a day and add Namenda in 1 month if the patient is tolerating the Aricept without difficulty

## 2018-07-19 DIAGNOSIS — M25561 Pain in right knee: Secondary | ICD-10-CM | POA: Diagnosis not present

## 2018-07-19 DIAGNOSIS — M1711 Unilateral primary osteoarthritis, right knee: Secondary | ICD-10-CM | POA: Diagnosis not present

## 2018-08-09 ENCOUNTER — Other Ambulatory Visit: Payer: Self-pay | Admitting: Family Medicine

## 2018-08-09 DIAGNOSIS — M25561 Pain in right knee: Secondary | ICD-10-CM | POA: Diagnosis not present

## 2018-08-09 MED ORDER — ATENOLOL 25 MG PO TABS: ORAL_TABLET | ORAL | 1 refills | Status: DC

## 2018-08-11 ENCOUNTER — Other Ambulatory Visit: Payer: Self-pay | Admitting: Family Medicine

## 2018-08-11 MED ORDER — DONEPEZIL HCL 10 MG PO TABS: 10.0000 mg | ORAL_TABLET | Freq: Every day | ORAL | 3 refills | Status: DC

## 2018-08-11 MED ORDER — MEMANTINE HCL 5 MG PO TABS: 5.0000 mg | ORAL_TABLET | Freq: Two times a day (BID) | ORAL | 3 refills | Status: DC

## 2018-08-31 ENCOUNTER — Other Ambulatory Visit: Payer: Self-pay | Admitting: Family Medicine

## 2018-09-14 ENCOUNTER — Other Ambulatory Visit: Payer: Self-pay

## 2018-09-14 ENCOUNTER — Ambulatory Visit (INDEPENDENT_AMBULATORY_CARE_PROVIDER_SITE_OTHER): Payer: Medicare Other | Admitting: Endocrinology

## 2018-09-14 ENCOUNTER — Encounter: Payer: Self-pay | Admitting: Endocrinology

## 2018-09-14 VITALS — BP 110/50 | HR 60 | Ht 69.0 in | Wt 181.0 lb

## 2018-09-14 DIAGNOSIS — E1165 Type 2 diabetes mellitus with hyperglycemia: Secondary | ICD-10-CM

## 2018-09-14 DIAGNOSIS — Z794 Long term (current) use of insulin: Secondary | ICD-10-CM

## 2018-09-14 DIAGNOSIS — E7800 Pure hypercholesterolemia, unspecified: Secondary | ICD-10-CM

## 2018-09-14 LAB — POCT GLYCOSYLATED HEMOGLOBIN (HGB A1C): Hemoglobin A1C: 8.2 % — AB (ref 4.0–5.6)

## 2018-09-14 LAB — BASIC METABOLIC PANEL
BUN: 30 mg/dL — ABNORMAL HIGH (ref 6–23)
CO2: 27 mEq/L (ref 19–32)
Calcium: 9.2 mg/dL (ref 8.4–10.5)
Chloride: 105 mEq/L (ref 96–112)
Creatinine, Ser: 1.24 mg/dL (ref 0.40–1.50)
GFR: 55.1 mL/min — ABNORMAL LOW (ref >60.00)
Glucose, Bld: 150 mg/dL — ABNORMAL HIGH (ref 70–99)
Potassium: 4.1 mEq/L (ref 3.5–5.1)
Sodium: 141 mEq/L (ref 135–145)

## 2018-09-14 LAB — LIPID PANEL
CHOLESTEROL: 125 mg/dL (ref 0–200)
HDL: 54 mg/dL (ref >39.00)
LDL Cholesterol: 59 mg/dL (ref 0–99)
NonHDL: 70.51
Total CHOL/HDL Ratio: 2
Triglycerides: 58 mg/dL (ref 0.0–149.0)
VLDL: 11.6 mg/dL (ref 0.0–40.0)

## 2018-09-14 LAB — GLUCOSE, POCT (MANUAL RESULT ENTRY): POC Glucose: 183 mg/dl — AB (ref 70–99)

## 2018-09-14 MED ORDER — GLUCOSE BLOOD VI STRP: ORAL_STRIP | 12 refills | Status: DC

## 2018-09-14 NOTE — Progress Notes (Signed)
Patient ID: [name], male   DOB: 1928/08/11, 85 y.o.   MRN: [id number]   Reason for Appointment: Diabetes follow-up   History of Present Illness   Diagnosis: Type 2 DIABETES MELITUS, diagnoses 1972   He has had long-standing diabetes and has been on basal bolus insulin regimen after failure of oral hypoglycemic drugs over the last several years His blood sugars have been generally fairly well controlled but has a tendency to high postprandial readings Previously required only a small dose of 5 units of Levemir insulin  Taking Amaryl in addition to his insulin regimen  his blood sugars had improved his blood sugars during the daytime  He had taken Toujeo since 9/16 instead of twice a day Levemir  RECENT history:   Insulin regimen:  TRESIBA 16 units at breakfast  Oral hypoglycemic drugs: Metformin 1500 mg , Jardiance 10 mg daily   A1c is 8.2 and this is higher than 6.8 previously  Current management, blood sugar patterns and problems identified  He has had some difficulty checking his blood sugar with his meter and not clear why  Has only 3-4 readings in the last 6 weeks and these are with the strips that are not expired but his son has not been able to make the meter work also  Has supposedly taken his insulin regularly but he has a reading of 462 in early January and not clear why  Check blood sugars only in the mornings despite reminders to do readings after meals also  He is taking his Tyler Aas consistently in the morning according to his son  Nonfasting glucose today is 183    Side effects from medications: None          Proper timing of insulin in relation to meals: Yes.          Monitors blood glucose: <1 times a day.    Glucometer: One Prob[employer] .          Blood Glucose readings from review of meter recently   PRE-MEAL Fasting Lunch Dinner Bedtime Overall  Glucose range: 005,110,211      Mean/median:        POST-MEAL PC Breakfast PC Lunch  PC Dinner  Glucose range: 271,281    Mean/median:      Previously:  PRE-MEAL Fasting Lunch Dinner Bedtime Overall  Glucose range:  68-233      Mean/median:  127    127   POST-MEAL PC Breakfast PC Lunch PC Dinner  Glucose range:   ?  Mean/median:        Meals: 3 meals per day. Breakfast at 8 am Lunch 12 noon Supper at 5-6 pm; breakfast is usually oatmeal with eggs.  Drinks water mostly and no sweet drinks            Physical activity: exercise: Only some walking around the house, no formal exercise     Wt Readings from Last 3 Encounters:  09/14/18 181 lb (82.1 kg)  07/13/18 177 lb (80.3 kg)  06/09/18 174 lb (78.9 kg)   Lab Results  Component Value Date   HGBA1C 6.8 (A) 06/09/2018   HGBA1C 7.3 (A) 02/16/2018   HGBA1C 11.4 11/18/2017   Lab Results  Component Value Date   MICROALBUR 6.0 (H) 06/09/2018   LDLCALC 60 11/18/2017   CREATININE 1.17 06/09/2018     Allergies as of 09/14/2018      Reactions   Avodart [dutasteride] Swelling   Ibuprofen Nausea And Vomiting  Morphine Nausea And Vomiting      Medication List       Accurate as of September 14, 2018 11:21 AM. Always use your most recent med list.        aspirin 81 MG tablet Take 81 mg by mouth daily.   atenolol 25 MG tablet Commonly known as:  TENORMIN TAKE 1 TABLET BY MOUTH EVERY MORNING AND 1/2 TABLET AT BEDTIME   azelastine 0.1 % nasal spray Commonly known as:  ASTELIN Place 1 spray into both nostrils 2 (two) times daily. Use in each nostril as directed   cholecalciferol 1000 units tablet Commonly known as:  VITAMIN D Take 1,000 Units by mouth daily.   clotrimazole-betamethasone cream Commonly known as:  LOTRISONE clotrimazole-betamethasone 1 %-0.05 % topical cream   diclofenac sodium 1 % Gel Commonly known as:  VOLTAREN Apply 2 g topically 4 (four) times daily.   docusate sodium 100 MG capsule Commonly known as:  COLACE Take 100 mg by mouth 2 (two) times daily.   donepezil 10 MG  tablet Commonly known as:  ARICEPT Take 1 tablet (10 mg total) by mouth at bedtime.   empagliflozin 10 MG Tabs tablet Commonly known as:  JARDIANCE Take 10 mg by mouth daily with breakfast.   ferrous gluconate 325 MG tablet Commonly known as:  FERGON Take 325 mg by mouth daily with breakfast.   fluticasone 50 MCG/ACT nasal spray Commonly known as:  FLONASE PLACE 2 SPRAYS INTO BOTH NOSTRILS DAILY.   glimepiride 4 MG tablet Commonly known as:  AMARYL TAKE 1 TABLET BY MOUTH DAILY BEFORE SUPPER   glucose blood test strip Commonly known as:  ONETOUCH VERIO Use as instructed to check blood sugar twice a day Dx code E11.65   Insulin Pen Needle 31G X 5 MM Misc Commonly known as:  B-D UF III MINI PEN NEEDLES USE 4 TIMES PER DAY   B-D UF III MINI PEN NEEDLES 31G X 5 MM Misc Generic drug:  Insulin Pen Needle USE 4 TIMES PER DAY   memantine 5 MG tablet Commonly known as:  NAMENDA Take 1 tablet (5 mg total) by mouth 2 (two) times daily.   metFORMIN 1000 MG tablet Commonly known as:  GLUCOPHAGE metformin 1,000 mg tablet   mometasone 0.1 % ointment Commonly known as:  ELOCON Apply topically daily.   omeprazole 20 MG capsule Commonly known as:  PRILOSEC Take 20 mg by mouth daily.   ONE TOUCH ULTRA 2 w/Device Kit Use to test blood sugar daily Dx code E11.65   simvastatin 80 MG tablet Commonly known as:  ZOCOR Take 40 mg by mouth at bedtime.   TRESIBA FLEXTOUCH 100 UNIT/ML Sopn FlexTouch Pen Generic drug:  insulin degludec 16 Units. INJECT 16 UNITS UNDER THE SKIN ONCE DAILY.       Allergies:  Allergies  Allergen Reactions  . Avodart [Dutasteride] Swelling  . Ibuprofen Nausea And Vomiting  . Morphine Nausea And Vomiting    Past Medical History:  Diagnosis Date  . B12 deficiency   . BPH (benign prostatic hypertrophy)   . Colon polyps   . Diabetes mellitus   . GERD (gastroesophageal reflux disease)    hiatal hernia  . History of kidney stones   .  Hyperlipidemia   . Hypertension   . Osteoarthritis     Past Surgical History:  Procedure Laterality Date  . APPENDECTOMY    . CARDIOVASCULAR STRESS TEST  09/02/2011   Post stress myocardial perfusion images show normal pattern of  perfusion in all areas. No significant wall abnormalites noted.  Marland Kitchen CAROTID DOPPLER  01/27/2011   Right & Left ICAs 0-49% diameter reduction, right & left subclavian arteries demonstrated less than 50% diameter reduction.  . CYSTOSCOPY WITH BIOPSY  06/17/2012   Procedure: CYSTOSCOPY WITH BIOPSY;  Surgeon: Molli Hazard, MD;  Location: [facility];  Service: Urology;  Laterality: N/A;  . KNEE CARTILAGE SURGERY     Arthroscope  . Partial amputaion left foot    . TRANSTHORACIC ECHOCARDIOGRAM  09/02/2011   EF >55%, normal LV systolic function, mild diastolic dysfunction  . TRANSURETHRAL RESECTION OF PROSTATE  06/17/2012   Procedure: TRANSURETHRAL RESECTION OF THE PROSTATE WITH GYRUS INSTRUMENTS;  Surgeon: Molli Hazard, MD;  Location: [facility];  Service: Urology;  Laterality: N/A;    Family History  Problem Relation Age of Onset  . Heart attack Mother   . Hypertension Mother     Social History:  reports that he has quit smoking. He has never used smokeless tobacco. He reports that he does not drink alcohol or use drugs.  Review of Systems:  HYPERTENSION: Controlled, only on low-dose atenolol This has been followed by PCP Also on Jardiance 10 mg  HYPERLIPIDEMIA:   Treated with high-dose simvastatin  with good control as of 9/18  Lab Results  Component Value Date   CHOL 132 11/18/2017   HDL 55.70 11/18/2017   LDLCALC 60 11/18/2017   TRIG 82.0 11/18/2017   CHOLHDL 2 11/18/2017   Last foot exam: 07/2017      Examination:   BP (!) 110/50 (BP Location: Left Arm, Patient Position: Sitting, Cuff Size: Normal)   Pulse 60   Ht _0  (1.753 m)   Wt 181 lb (82.1 kg)   SpO2 96%   BMI 26.73 kg/m   Body mass index is 26.73 kg/m.      ASSESSMENT/  PLAN:    Diabetes type 2 insulin Dependent  See history of present illness for detailed discussion of his current management, blood sugar patterns and problems identified   1C surprisingly improved at 7.3  His blood sugars are totally out of control with blood sugars averaging 350 This is markedly unusual for him since previously A1c was 7.5 He is also symptomatic with hyperglycemia  Part of his hypoglycemia is related to his difficulty remembering to take his insulin and may be getting to do that at least in the evening and may be in the morning also Also has had more stress with his wife and nursing home probably tending to eat out more in the evening  Clearly he is insulin deficient now Although unlikely that stopping Amaryl would cause hypoglycemia at this stage of his diabetes this may have happened before also on one occasion   Recommendations   Restart Amaryl  Check chemistry panel   Change insulin to TRESIBA since his fasting readings may be the highest of the day and unlikely to be getting any higher postprandial readings  Also will not be able to comply with the evening mixed insulin at suppertime; increasing the dose of premixed insulin may risk hypoglycemia overnight also  Since previously had not been able to manage the V-go pump on his own will not attempt this  His son was shown how to use the titration sheet given to him to adjust the [facility] based on fasting blood sugars every 3 to 5 days  We will start with 20 units and stop the 70/30 insulin for now  Continue Amaryl,  Jardiance and metformin as long as renal function is normal  His son will call next week to give [place] his blood sugar readings for further adjustment  May consider consultation with diabetes educator also    Patient Instructions  Check blood sugars on waking up days a week  Also check blood sugars about 2 hours after meals and do this after different meals by rotation  Recommended blood  sugar levels on waking up are 90-130 and about 2 hours after meal is 130-160  Please bring your blood sugar monitor to each visit, thank you       Elayne Snare 09/14/2018, 11:21 AM                     Patient ID: [name], male   DOB: Apr 30, 1929, 85 y.o.   MRN: [id number]   Reason for Appointment: Diabetes follow-up   History of Present Illness   Diagnosis: Type 2 DIABETES MELITUS, diagnoses 1972   He has had long-standing diabetes and has been on basal bolus insulin regimen after failure of oral hypoglycemic drugs over the last several years His blood sugars have been generally fairly well controlled but has a tendency to high postprandial readings Previously required only a small dose of 5 units of Levemir insulin  Taking Amaryl in addition to his insulin regimen  his blood sugars had improved his blood sugars during the daytime  He had taken Toujeo since 9/16 instead of twice a day Levemir  RECENT history:   Insulin regimen:  Novolog mix 70/30: 15 units at breakfast, 8 acs Oral hypoglycemic drugs: Metformin 1500 mg , Jardiance 10 mg daily   A1c is 11.4 compared to 7.5% previously   Current management, blood sugar patterns and problems identified  His blood sugars are markedly increased since his last visit  According to his son he is probably not taking his evening insulin consistently or sometimes after supper at night because he goes to the nursing home to visit his wife late afternoon  He did not take his insulin last night and today his blood sugar was 541  He has lost weight and also appears to be much more thirsty and getting more fatigued  His son thinks that he is also more stressed  AMARYL was stopped on his last visit while there medications were continued  He is checking only FASTING blood sugars and forgets to do readings later in the day  He has not changed his diet and also on his fluid intake he is still continuing to use diet  drinks or water    Side effects from medications: None          Proper timing of insulin in relation to meals: Yes.          Monitors blood glucose: <1 times a day.    Glucometer: One Touch ultra 2 .          Blood Glucose readings   .Mean values apply above for all meters except median for One Touch  PRE-MEAL Fasting Lunch Dinner Bedtime Overall  Glucose range:  157-541   ?   Mean/median:  349        PREVIOUS MEDIAN 161   Meals: 3 meals per day. Breakfast at 8 am Lunch 12 noon Supper at 5-6 pm; breakfast is usually oatmeal with eggs.  Drinks water mostly and no sweet drinks            Physical activity: exercise: occasionally, bike  Wt Readings from Last 3 Encounters:  09/14/18 181 lb (82.1 kg)  07/13/18 177 lb (80.3 kg)  06/09/18 174 lb (78.9 kg)   Lab Results  Component Value Date   HGBA1C 6.8 (A) 06/09/2018   HGBA1C 7.3 (A) 02/16/2018   HGBA1C 11.4 11/18/2017   Lab Results  Component Value Date   MICROALBUR 6.0 (H) 06/09/2018   LDLCALC 60 11/18/2017   CREATININE 1.17 06/09/2018     Allergies as of 09/14/2018      Reactions   Avodart [dutasteride] Swelling   Ibuprofen Nausea And Vomiting   Morphine Nausea And Vomiting      Medication List       Accurate as of September 14, 2018 11:21 AM. Always use your most recent med list.        aspirin 81 MG tablet Take 81 mg by mouth daily.   atenolol 25 MG tablet Commonly known as:  TENORMIN TAKE 1 TABLET BY MOUTH EVERY MORNING AND 1/2 TABLET AT BEDTIME   azelastine 0.1 % nasal spray Commonly known as:  ASTELIN Place 1 spray into both nostrils 2 (two) times daily. Use in each nostril as directed   cholecalciferol 1000 units tablet Commonly known as:  VITAMIN D Take 1,000 Units by mouth daily.   clotrimazole-betamethasone cream Commonly known as:  LOTRISONE clotrimazole-betamethasone 1 %-0.05 % topical cream   diclofenac sodium 1 % Gel Commonly known as:  VOLTAREN Apply 2 g topically 4 (four)  times daily.   docusate sodium 100 MG capsule Commonly known as:  COLACE Take 100 mg by mouth 2 (two) times daily.   donepezil 10 MG tablet Commonly known as:  ARICEPT Take 1 tablet (10 mg total) by mouth at bedtime.   empagliflozin 10 MG Tabs tablet Commonly known as:  JARDIANCE Take 10 mg by mouth daily with breakfast.   ferrous gluconate 325 MG tablet Commonly known as:  FERGON Take 325 mg by mouth daily with breakfast.   fluticasone 50 MCG/ACT nasal spray Commonly known as:  FLONASE PLACE 2 SPRAYS INTO BOTH NOSTRILS DAILY.   glimepiride 4 MG tablet Commonly known as:  AMARYL TAKE 1 TABLET BY MOUTH DAILY BEFORE SUPPER   glucose blood test strip Commonly known as:  ONETOUCH VERIO Use as instructed to check blood sugar twice a day Dx code E11.65   Insulin Pen Needle 31G X 5 MM Misc Commonly known as:  B-D UF III MINI PEN NEEDLES USE 4 TIMES PER DAY   B-D UF III MINI PEN NEEDLES 31G X 5 MM Misc Generic drug:  Insulin Pen Needle USE 4 TIMES PER DAY   memantine 5 MG tablet Commonly known as:  NAMENDA Take 1 tablet (5 mg total) by mouth 2 (two) times daily.   metFORMIN 1000 MG tablet Commonly known as:  GLUCOPHAGE metformin 1,000 mg tablet   mometasone 0.1 % ointment Commonly known as:  ELOCON Apply topically daily.   omeprazole 20 MG capsule Commonly known as:  PRILOSEC Take 20 mg by mouth daily.   ONE TOUCH ULTRA 2 w/Device Kit Use to test blood sugar daily Dx code E11.65   simvastatin 80 MG tablet Commonly known as:  ZOCOR Take 40 mg by mouth at bedtime.   TRESIBA FLEXTOUCH 100 UNIT/ML Sopn FlexTouch Pen Generic drug:  insulin degludec 16 Units. INJECT 16 UNITS UNDER THE SKIN ONCE DAILY.       Allergies:  Allergies  Allergen Reactions  . Avodart [Dutasteride] Swelling  . Ibuprofen Nausea And Vomiting  .  Morphine Nausea And Vomiting    Past Medical History:  Diagnosis Date  . B12 deficiency   . BPH (benign prostatic hypertrophy)   .  Colon polyps   . Diabetes mellitus   . GERD (gastroesophageal reflux disease)    hiatal hernia  . History of kidney stones   . Hyperlipidemia   . Hypertension   . Osteoarthritis     Past Surgical History:  Procedure Laterality Date  . APPENDECTOMY    . CARDIOVASCULAR STRESS TEST  09/02/2011   Post stress myocardial perfusion images show normal pattern of perfusion in all areas. No significant wall abnormalites noted.  Marland Kitchen CAROTID DOPPLER  01/27/2011   Right & Left ICAs 0-49% diameter reduction, right & left subclavian arteries demonstrated less than 50% diameter reduction.  . CYSTOSCOPY WITH BIOPSY  06/17/2012   Procedure: CYSTOSCOPY WITH BIOPSY;  Surgeon: Molli Hazard, MD;  Location: [facility];  Service: Urology;  Laterality: N/A;  . KNEE CARTILAGE SURGERY     Arthroscope  . Partial amputaion left foot    . TRANSTHORACIC ECHOCARDIOGRAM  09/02/2011   EF >55%, normal LV systolic function, mild diastolic dysfunction  . TRANSURETHRAL RESECTION OF PROSTATE  06/17/2012   Procedure: TRANSURETHRAL RESECTION OF THE PROSTATE WITH GYRUS INSTRUMENTS;  Surgeon: Molli Hazard, MD;  Location: [facility];  Service: Urology;  Laterality: N/A;    Family History  Problem Relation Age of Onset  . Heart attack Mother   . Hypertension Mother     Social History:  reports that he has quit smoking. He has never used smokeless tobacco. He reports that he does not drink alcohol or use drugs.  Review of Systems:  HYPERTENSION:  This is mild, only on low-dose atenolol, followed by PCP Also on Jardiance 10 mg  HYPERLIPIDEMIA:   Treated with 80 mg simvastatin by his PCP with good control as of 10/2017  Lab Results  Component Value Date   CHOL 132 11/18/2017   HDL 55.70 11/18/2017   LDLCALC 60 11/18/2017   TRIG 82.0 11/18/2017   CHOLHDL 2 11/18/2017   Last foot exam: 07/2017     Examination:   BP (!) 110/50 (BP Location: Left Arm, Patient Position: Sitting, Cuff Size: Normal)   Pulse 60    Ht _0  (1.753 m)   Wt 181 lb (82.1 kg)   SpO2 96%   BMI 26.73 kg/m   Body mass index is 26.73 kg/m.    ASSESSMENT/ PLAN:    Diabetes type 2 insulin requiring  See history of present illness for detailed discussion of his current management, blood sugar patterns and problems identified  His A1c is relatively higher at 8.2, previously was better at 6.8  Not clear why his blood sugars are relatively higher but may be related to inconsistent diet Also he is probably eating better with recent weight gain As before he does not check his blood sugars much and lately forgets to do so are having some technical issues with his meter Considering his age and duration of diabetes his control is still reasonable  He seems to be compliant with basal insulin, metformin and Jardiance However may not take his oral medications at the same time more consistently    Recommendations  No change in regimen Need to do follow-up labs on serum chemistry and lipids New glucose meter given Reminded him to check readings at least 3 times a week and at different time Increase physical activity as tolerated   Patient Instructions  Check  blood sugars on waking up days a week  Also check blood sugars about 2 hours after meals and do this after different meals by rotation  Recommended blood sugar levels on waking up are 90-130 and about 2 hours after meal is 130-160  Please bring your blood sugar monitor to each visit, thank you       Elayne Snare 09/14/2018, 11:21 AM

## 2018-09-14 NOTE — Patient Instructions (Signed)
Check blood sugars on waking up days a week  Also check blood sugars about 2 hours after meals and do this after different meals by rotation  Recommended blood sugar levels on waking up are 90-130 and about 2 hours after meal is 130-160  Please bring your blood sugar monitor to each visit, thank you   

## 2018-09-28 ENCOUNTER — Telehealth: Payer: Self-pay | Admitting: Family Medicine

## 2018-09-28 ENCOUNTER — Ambulatory Visit (HOSPITAL_COMMUNITY)
Admission: RE | Admit: 2018-09-28 | Discharge: 2018-09-28 | Disposition: A | Discharge status: Home / Self Care | Payer: Medicare Other | Source: Ambulatory Visit | Attending: Family Medicine | Admitting: Family Medicine

## 2018-09-28 ENCOUNTER — Ambulatory Visit (INDEPENDENT_AMBULATORY_CARE_PROVIDER_SITE_OTHER): Payer: Medicare Other | Admitting: Family Medicine

## 2018-09-28 ENCOUNTER — Encounter: Payer: Self-pay | Admitting: Family Medicine

## 2018-09-28 VITALS — BP 112/74 | HR 60 | Temp 97.3°F | Ht 69.0 in | Wt 176.2 lb

## 2018-09-28 DIAGNOSIS — W108XXA Fall (on) (from) other stairs and steps, initial encounter: Secondary | ICD-10-CM

## 2018-09-28 DIAGNOSIS — I6782 Cerebral ischemia: Secondary | ICD-10-CM | POA: Diagnosis not present

## 2018-09-28 DIAGNOSIS — S0990XA Unspecified injury of head, initial encounter: Secondary | ICD-10-CM

## 2018-09-28 DIAGNOSIS — M5030 Other cervical disc degeneration, unspecified cervical region: Secondary | ICD-10-CM | POA: Diagnosis not present

## 2018-09-28 DIAGNOSIS — S199XXA Unspecified injury of neck, initial encounter: Secondary | ICD-10-CM | POA: Diagnosis not present

## 2018-09-28 DIAGNOSIS — M542 Cervicalgia: Secondary | ICD-10-CM

## 2018-09-28 DIAGNOSIS — E538 Deficiency of other specified B group vitamins: Secondary | ICD-10-CM | POA: Diagnosis not present

## 2018-09-28 NOTE — Telephone Encounter (Signed)
CT results of neck and head reviewed images personally reviewed by me, agree with findings of no intracranial acute pathology or bleed, no fx-   results reviewed with pt's son - recommended gentle rehab with evidence of degenerative changes and stenosis of cervical spine - heat therapy, mild ROM stretching right now, PT and if any worsening or development of radiculopathy - recommend f/up with PCP or specialist to further eval.

## 2018-09-28 NOTE — Progress Notes (Signed)
Patient ID: [name], male    DOB: 07-12-1929, 85 y.o.   MRN: [id number]  PCP: Susy Frizzle, MD  Chief Complaint  Patient presents with  . Fall    Patient in c/o with fall. Was walking up stairs fell and went into the fall face first into fall, pushed head back when head hit fall. Fall occured     Subjective:   [name] is a 85 y.o. male, presents to clinic with CC of head trauma and neck pain secondary to fall injury that occurred 3 days ago.  He was walking upstairs carrying pillows in his arms and he tripped on the last step which she could not see with after a short landing which turned to the left this caused him to fall hitting his forehead into the wall more than 5 feet away from him.  This today in the wall, and extended his neck he had immediate neck pain.  His son who is a Airline pilot was with him, is here in clinic and states that he did not lose consciousness the time of injury or anytime afterwards.  He has not had any vomiting episodes, altered mental status, focal weakness, slurred speech, facial droop, seizure-like activity.  He complained of neck pain at the time of injury and it has been continuous since then he states it hurts more on the left side than on the right he can barely rotate his head to the left.  Patient states it is gotten a little bit better over the past 3 days.  He has an abrasion to his right central forehead and he is developed bruising and ecchymosis which has gradually developed and worsened inferior to the abrasion to both eyes and nose.  The patient and his son deny any drainage clear or bloody from his ears or nose.  Patient denies any headache, photophobia, phonophobia, dizziness, vertigo, change in hearing/taste.   Patient's chart states that he is on a baby aspirin but his son says that he is no longer taking that and for various aches and pains and arthritis he is taking 625 of Tylenol.  Patient son states he has a high pain  tolerance, and patient rates his pain today 7 out of 10 located to his left mid neck.  He says when he tries to rotate his head or look up or down he feels "the bones grinding together."  He denies any numbness or weakness in his UE's.     Patient Active Problem List   Diagnosis Date Noted  . Type 2 diabetes mellitus without complication ([place]) 22/48/2500  . PAC (premature atrial contraction) 05/21/2015  . S/P TURP (status post transurethral resection of prostate) 11/16/2013  . UTI (urinary tract infection) 11/16/2013  . Aortic sclerosis 11/16/2013  . Chronic kidney disease, stage II (mild) 08/02/2013  . Hypertension   . Hyperlipidemia   . B12 deficiency   . Type II or unspecified type diabetes mellitus without mention of complication, uncontrolled 03/01/2013  . Pure hypercholesterolemia 03/01/2013  . BPH (benign prostatic hypertrophy) 10/21/2010  . Rhinitis 10/21/2010  . History of kidney stones 10/21/2010  . UNSPECIFIED ANEMIA 03/26/2009  . NONSPECIFIC ABNORMAL FINDING IN STOOL CONTENTS 03/26/2009  . PERSONAL HISTORY OF COLONIC POLYPS 03/26/2009     Prior to Admission medications   Medication Sig Start Date End Date Taking? Authorizing Provider  aspirin 81 MG tablet Take 81 mg by mouth daily.   Yes [provider]  atenolol (TENORMIN) 25  MG tablet TAKE 1 TABLET BY MOUTH EVERY MORNING AND 1/2 TABLET AT BEDTIME 08/31/18  Yes Susy Frizzle, MD  azelastine (ASTELIN) 0.1 % nasal spray Place 1 spray into both nostrils 2 (two) times daily. Use in each nostril as directed 09/06/14  Yes Sebastian, Modena Nunnery, MD  B-D UF III MINI PEN NEEDLES 31G X 5 MM MISC USE 4 TIMES PER DAY 08/25/16  Yes Elayne Snare, MD  Blood Glucose Monitoring Suppl (ONE TOUCH ULTRA 2) w/Device KIT Use to test blood sugar daily Dx code E11.65 09/24/16  Yes Elayne Snare, MD  cholecalciferol (VITAMIN D) 1000 UNITS tablet Take 1,000 Units by mouth daily.   Yes [provider]  clotrimazole-betamethasone  (LOTRISONE) cream clotrimazole-betamethasone 1 %-0.05 % topical cream   Yes [provider]  diclofenac sodium (VOLTAREN) 1 % GEL Apply 2 g topically 4 (four) times daily. 07/13/18  Yes Susy Frizzle, MD  docusate sodium (COLACE) 100 MG capsule Take 100 mg by mouth 2 (two) times daily.   Yes [provider]  donepezil (ARICEPT) 10 MG tablet Take 1 tablet (10 mg total) by mouth at bedtime. 08/11/18  Yes Susy Frizzle, MD  empagliflozin (JARDIANCE) 10 MG TABS tablet Take 10 mg by mouth daily with breakfast. 04/13/17  Yes Elayne Snare, MD  ferrous gluconate (FERGON) 325 MG tablet Take 325 mg by mouth daily with breakfast.   Yes [provider]  fluticasone (FLONASE) 50 MCG/ACT nasal spray PLACE 2 SPRAYS INTO BOTH NOSTRILS DAILY. 12/08/16  Yes Susy Frizzle, MD  glimepiride (AMARYL) 4 MG tablet TAKE 1 TABLET BY MOUTH DAILY BEFORE SUPPER 06/11/18  Yes Elayne Snare, MD  glucose blood (ONETOUCH VERIO) test strip Use as instructed to check blood sugar twice a day Dx code E11.65 09/14/18  Yes Elayne Snare, MD  insulin degludec (TRESIBA FLEXTOUCH) 100 UNIT/ML SOPN FlexTouch Pen 16 Units. INJECT 16 UNITS UNDER THE SKIN ONCE DAILY.   Yes [provider]  Insulin Pen Needle (B-D UF III MINI PEN NEEDLES) 31G X 5 MM MISC USE 4 TIMES PER DAY 02/22/15  Yes Elayne Snare, MD  memantine (NAMENDA) 5 MG tablet Take 1 tablet (5 mg total) by mouth 2 (two) times daily. 08/11/18  Yes Susy Frizzle, MD  metFORMIN (GLUCOPHAGE) 1000 MG tablet metformin 1,000 mg tablet   Yes [provider]  mometasone (ELOCON) 0.1 % ointment Apply topically daily. 12/21/14  Yes Susy Frizzle, MD  omeprazole (PRILOSEC) 20 MG capsule Take 20 mg by mouth daily.     Yes [provider]  simvastatin (ZOCOR) 80 MG tablet Take 40 mg by mouth at bedtime.   Yes [provider]     Allergies  Allergen Reactions  . Avodart [Dutasteride] Swelling  . Ibuprofen Nausea And Vomiting    . Morphine Nausea And Vomiting     Family History  Problem Relation Age of Onset  . Heart attack Mother   . Hypertension Mother      Social History   Socioeconomic History  . Marital status: Married    Spouse name: Not on file  . Number of children: Not on file  . Years of education: Not on file  . Highest education level: Not on file  Occupational History  . Not on file  Social Needs  . Financial resource strain: Not on file  . Food insecurity:    Worry: Not on file    Inability: Not on file  . Transportation needs:  Medical: Not on file    Non-medical: Not on file  Tobacco Use  . Smoking status: Former Research scientist (life sciences)  . Smokeless tobacco: Never Used  Substance and Sexual Activity  . Alcohol use: No    Alcohol/week: 0.0 standard drinks  . Drug use: No  . Sexual activity: Not on file    Comment: married to [place], retired.  Lifestyle  . Physical activity:    Days per week: Not on file    Minutes per session: Not on file  . Stress: Not on file  Relationships  . Social connections:    Talks on phone: Not on file    Gets together: Not on file    Attends religious service: Not on file    Active member of club or organization: Not on file    Attends meetings of clubs or organizations: Not on file    Relationship status: Not on file  . Intimate partner violence:    Fear of current or ex partner: Not on file    Emotionally abused: Not on file    Physically abused: Not on file    Forced sexual activity: Not on file  Other Topics Concern  . Not on file  Social History Narrative  . Not on file     Review of Systems  Constitutional: Negative.  Negative for activity change, appetite change, chills, diaphoresis, fatigue and fever.  HENT: Positive for facial swelling. Negative for sinus pressure and sinus pain.   Eyes: Negative for photophobia, pain, discharge, redness, itching and visual disturbance.  Respiratory: Negative.   Cardiovascular: Negative.    Gastrointestinal: Negative.   Genitourinary: Negative.   Musculoskeletal: Positive for back pain, neck pain and neck stiffness.  Skin: Positive for color change and wound (abrasion).  Allergic/Immunologic: Negative.   Neurological: Negative for dizziness, tremors, seizures, syncope, speech difficulty, weakness, light-headedness, numbness and headaches.  Hematological: Negative.   Psychiatric/Behavioral: Negative.  Negative for agitation, behavioral problems, confusion, decreased concentration and sleep disturbance.  All other systems reviewed and are negative.      Objective:    Vitals:   09/28/18 1525  BP: 112/74  Pulse: 60  Temp: (!) 97.3 F (36.3 C)  TempSrc: Oral  SpO2: 94%  Weight: 176 lb 4 oz (79.9 kg)  Height: 5' 9"  (1.753 m)      Physical Exam Vitals signs and nursing note reviewed.  Constitutional:      Appearance: He is well-developed.  HENT:     Head: Normocephalic. Abrasion and contusion present. No raccoon eyes, Battle's sign, masses, right periorbital erythema or left periorbital erythema.     Jaw: There is normal jaw occlusion. No trismus or tenderness.     Comments: 3x3 irregularly shaped abrasion to right central forehead, mild edema and     Right Ear: Hearing, tympanic membrane and external ear normal. No drainage. No hemotympanum. Tympanic membrane is not perforated.     Left Ear: Hearing, tympanic membrane and external ear normal. No drainage. No hemotympanum. Tympanic membrane is not perforated.     Nose: Nose normal. No nasal deformity, septal deviation, mucosal edema, congestion or rhinorrhea.     Right Nostril: No epistaxis or septal hematoma.     Left Nostril: No epistaxis or septal hematoma.     Mouth/Throat:     Lips: Pink.     Mouth: Mucous membranes are moist. No injury or oral lesions.     Pharynx: Oropharynx is clear. Uvula midline.  Eyes:     General:  Right eye: No discharge.        Left eye: No discharge.     Extraocular  Movements:     Right eye: Normal extraocular motion and no nystagmus.     Left eye: Normal extraocular motion and no nystagmus.     Conjunctiva/sclera: Conjunctivae normal.     Right eye: No exudate or hemorrhage.    Left eye: No exudate or hemorrhage.    Comments: Diffuse bruising and mild edema to b/l eyes upper and lower lids R>L, no step off or tenderness to b/l orbital rim  Neck:     Musculoskeletal: Decreased range of motion. Neck rigidity, pain with movement and muscular tenderness present. No edema or erythema.     Trachea: Trachea and phonation normal. No tracheal deviation.     Comments: No midline tenderness to cervical spine, left cervical paraspinal muscle tenderness to roughly L3-6 Decreased ROM, rotation to the left < 5 degrees, rotation to the right to roughly 35-40 degrees, decreased flexion and extension Cardiovascular:     Rate and Rhythm: Normal rate and regular rhythm.  Pulmonary:     Effort: Pulmonary effort is normal. No respiratory distress.     Breath sounds: No stridor.  Skin:    General: Skin is warm.     Coloration: Skin is not mottled or pale.     Findings: Abrasion, bruising, ecchymosis and signs of injury present. No rash or wound.  Neurological:     General: No focal deficit present.     Mental Status: He is alert. Mental status is at baseline.     GCS: GCS eye subscore is 4. GCS verbal subscore is 5. GCS motor subscore is 6.     Cranial Nerves: No cranial nerve deficit, dysarthria or facial asymmetry.     Sensory: Sensation is intact.     Motor: No weakness or abnormal muscle tone.     Gait: Gait abnormal (antalgic).     Comments: CRANIAL NERVES:   II: Pupils equal and reactive, no RAPD   III, IV, VI: EOM intact, no gaze preference or deviation, no nystagmus.   V: normal sensation in V1, V2, and V3 segments bilaterally   VII: no asymmetry, no nasolabial fold flattening   VIII: normal hearing to speech   IX, X: normal palatal elevation, no uvular  deviation   XI: 5/5 shoulder shrug bilaterally   XII: midline tongue protrusion  MOTOR:  5/5 bilateral grip strength 5/5 strength dorsiflexion/plantarflexion b/l   SENSORY:  Normal to light touch     Psychiatric:        Behavior: Behavior normal.           Assessment & Plan:   Pt is a 85 y/o male presents with head injury and neck pain with decreased ROM secondary to fall that occurred 3 days ago.  Pt fell because he tripped going up steps, his forehead hit wall more than 5 feet away, denting the wall and hyperextending his neck and his face reportedly "slid down the wall" his son also states that the patient's "face broke his fall" but that he remained conscious, has not had any LOC, is at his baseline, facial contusion and black eyes/bruising is gradually worsening, and pt complains of continued neck pain, decrease ROM, "grinding" bone feeling in neck.  He denies any pain to face, eyes, head.  Per the chart patient was on aspirin but per his son he states that he has not taken it in a while, possibly  no oral anticoagulation. Patient has grossly normal neurological exam, no signs of basilar skull fracture, no instability of facial bones or orbital bones.   He has no tenderness along cervical spine on spinous processes but left paracervical spinal tenderness to palpation and decreased range of motion with rotation to the left, rotation to the right and with flexion and extension most concerning is patient can minimally rotate his head to the left, less than estimated 5 to 10 degrees.  Given the patient's age and mechanism of injury feel he needs rule out head CT and cervical spine without contrast to rule out intracranial pathology or cervical spinal fracture.  I did advise pt and his son to go to the ER but they do not want to go to any hospitals where he could be exposed to illness or flu.  I offered to arrange a stat CT scan if able to, but if not able to get some imaging today my  advice would remain the same to go to the ER for the rule out.    Unfortunately with his age and mechanism of injury with his physical exam findings today he is at risk for bleed/fx so R/O with imaging.    ICD-10-CM   1. Closed head injury, initial encounter S09.90XA CT Head Wo Contrast  2. Acute neck pain M54.2 CT CERVICAL SPINE WO CONTRAST    CANCELED: CT CERVICAL SPINE W WO CONTRAST  3. Fall (on) (from) other stairs and steps, initial encounter W10.8XXA CT Head Wo Contrast    CT CERVICAL SPINE WO CONTRAST    CANCELED: CT CERVICAL SPINE W WO CONTRAST     Arranged CT scans at [facility], pt and his son instructed to stay in department until there are results incase he needs any immediate attention.  In our clinic there was no C-collar available.  Delsa Grana, PA-C 09/28/18 3:38 PM

## 2018-10-11 ENCOUNTER — Encounter: Payer: Self-pay | Admitting: Podiatry

## 2018-10-11 ENCOUNTER — Other Ambulatory Visit: Payer: Self-pay

## 2018-10-11 ENCOUNTER — Ambulatory Visit (INDEPENDENT_AMBULATORY_CARE_PROVIDER_SITE_OTHER): Payer: Medicare Other | Admitting: Podiatry

## 2018-10-11 DIAGNOSIS — M79676 Pain in unspecified toe(s): Secondary | ICD-10-CM | POA: Diagnosis not present

## 2018-10-11 DIAGNOSIS — E0843 Diabetes mellitus due to underlying condition with diabetic autonomic (poly)neuropathy: Secondary | ICD-10-CM

## 2018-10-11 DIAGNOSIS — B351 Tinea unguium: Secondary | ICD-10-CM | POA: Diagnosis not present

## 2018-10-11 NOTE — Progress Notes (Signed)
Patient ID: [name], male   DOB: 05/23/1929, 85 y.o.   MRN: [id number] Complaint:  Visit Type: Patient returns to my office for continued preventative foot care services. Complaint: Patient states" my nails have grown long and thick and become painful to walk and wear shoes" Patient has been diagnosed with DM with no foot complications. The patient presents for preventative foot care services. No changes to ROS.  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  Podiatric Exam: Vascular: dorsalis pedis and posterior tibial pulses are palpable bilateral. Capillary return is immediate. Temperature gradient is WNL. Skin turgor WNL  Sensorium: Normal Semmes Weinstein monofilament test. Normal tactile sensation bilaterally. Nail Exam: Pt has thick disfigured discolored nails with subungual debris noted bilateral entire nail hallux through fifth toenails Ulcer Exam: There is no evidence of ulcer or pre-ulcerative changes or infection. Orthopedic Exam: Muscle tone and strength are WNL. No limitations in general ROM. No crepitus or effusions noted. Foot type and digits show no abnormalities. Bony prominences are unremarkable. Skin:  Porokeratosis   plantar aspect left hallux. Asymptomatic. . No infection or ulcers  Diagnosis:  Onychomycosis, , Pain in right toe, pain in left toes,    Treatment & Plan Procedures and Treatment: Consent by patient was obtained for treatment procedures. The patient understood the discussion of treatment and procedures well. All questions were answered thoroughly reviewed. Debridement of mycotic and hypertrophic toenails, 1 through 5 bilateral and clearing of subungual debris. No ulceration, no infection noted.  Return Visit-Office Procedure: Patient instructed to return to the office for a follow up visit 3 months for continued evaluation and treatment.    Trayvion Embleton DPM 

## 2018-12-04 ENCOUNTER — Other Ambulatory Visit: Payer: Self-pay | Admitting: Endocrinology

## 2018-12-06 NOTE — Progress Notes (Signed)
Patient ID: [name], male   DOB: Jul 25, 1929, 85 y.o.   MRN: [id number]   Today's office visit was provided via telemedicine using video technique Explained to the patient and the the limitations of evaluation and management by telemedicine and the availability of in person appointments.  The patient understood the limitations and agreed to proceed. Patient also understood that the telehealth visit is billable. . Location of the patient: Home . Location of the provider: Office Only the patient and myself were participating in the encounter    Reason for Appointment: Diabetes follow-up   History of Present Illness   Diagnosis: Type 2 DIABETES MELITUS, diagnoses 1972   He has had long-standing diabetes and has been on basal bolus insulin regimen after failure of oral hypoglycemic drugs over the last several years His blood sugars have been generally fairly well controlled but has a tendency to high postprandial readings Previously required only a small dose of 5 units of Levemir insulin  Taking Amaryl in addition to his insulin regimen  his blood sugars had improved his blood sugars during the daytime  He had taken Toujeo since 9/16 instead of twice a day Levemir  RECENT history:   Insulin regimen:  TRESIBA 16 units at breakfast  Oral hypoglycemic drugs: Metformin 1500 mg , Jardiance 10 mg daily Amaryl 4 mg before dinner  A1c was last 8.2 and this is higher than 6.8 previously  Current management, blood sugar patterns and problems identified  He was given a new glucose meter on his last visit  Although he has been checking his sugars more regularly these are mostly in the mornings  Again his son is trying to help him with his daily management  Compared to his last visit his blood sugars do appear to be relatively better especially in the morning when they were clear over 150  May occasionally have a higher reading after a meal such as breakfast but only 1  reading available recently  Unclear if he is having any high readings after dinner currently taking Amaryl before the evening meal  Adding the Amaryl may have helped get his sugars down a little better  He thinks he woke up sweating 1 night but did not check his sugar and not clear if he is having any other episodes of hypoglycemia  He does take his insulin regularly in the morning  He is somewhat active doing farm work, some mowing the lawn  Not clear if he has had a change in his weight No recent labs available    Side effects from medications: None          Proper timing of insulin in relation to meals: Yes.          Monitors blood glucose: <1 times a day.    Glucometer: One Probation officer .          Blood Glucose readings from review of meter recently  FASTING blood sugar range 80-135 Nonfasting 233 after breakfast and 163 in the evening Average not available  Previous readings:  PRE-MEAL Fasting Lunch Dinner Bedtime Overall  Glucose range: 155,462,170      Mean/median:        POST-MEAL PC Breakfast PC Lunch PC Dinner  Glucose range: 271,281    Mean/median:       Meals: 3 meals per day. Breakfast at 8 am Lunch 12 noon Supper at 5-6 pm; breakfast is usually oatmeal with eggs.  Drinks water mostly and no sweet  drinks    Wt Readings from Last 3 Encounters:  09/28/18 176 lb 4 oz (79.9 kg)  09/14/18 181 lb (82.1 kg)  07/13/18 177 lb (80.3 kg)   Lab Results  Component Value Date   HGBA1C 8.2 (A) 09/14/2018   HGBA1C 6.8 (A) 06/09/2018   HGBA1C 7.3 (A) 02/16/2018   Lab Results  Component Value Date   MICROALBUR 6.0 (H) 06/09/2018   LDLCALC 59 09/14/2018   CREATININE 1.24 09/14/2018     Allergies as of 12/07/2018      Reactions   Avodart [dutasteride] Swelling   Ibuprofen Nausea And Vomiting   Morphine Nausea And Vomiting      Medication List       Accurate as of Dec 06, 2018  9:06 PM. If you have any questions, ask your nurse or doctor.         atenolol 25 MG tablet Commonly known as:  TENORMIN TAKE 1 TABLET BY MOUTH EVERY MORNING AND 1/2 TABLET AT BEDTIME   azelastine 0.1 % nasal spray Commonly known as:  ASTELIN Place 1 spray into both nostrils 2 (two) times daily. Use in each nostril as directed   cholecalciferol 1000 units tablet Commonly known as:  VITAMIN D Take 1,000 Units by mouth daily.   clotrimazole-betamethasone cream Commonly known as:  LOTRISONE clotrimazole-betamethasone 1 %-0.05 % topical cream   diclofenac sodium 1 % Gel Commonly known as:  Voltaren Apply 2 g topically 4 (four) times daily.   docusate sodium 100 MG capsule Commonly known as:  COLACE Take 100 mg by mouth 2 (two) times daily.   donepezil 10 MG tablet Commonly known as:  Aricept Take 1 tablet (10 mg total) by mouth at bedtime.   empagliflozin 10 MG Tabs tablet Commonly known as:  Jardiance Take 10 mg by mouth daily with breakfast.   ferrous gluconate 325 MG tablet Commonly known as:  FERGON Take 325 mg by mouth daily with breakfast.   fluticasone 50 MCG/ACT nasal spray Commonly known as:  FLONASE PLACE 2 SPRAYS INTO BOTH NOSTRILS DAILY.   glimepiride 4 MG tablet Commonly known as:  AMARYL TAKE 1 TABLET BY MOUTH DAILY BEFORE SUPPER   glucose blood test strip Commonly known as:  OneTouch Verio Use as instructed to check blood sugar twice a day Dx code E11.65   Insulin Pen Needle 31G X 5 MM Misc Commonly known as:  B-D UF III MINI PEN NEEDLES USE 4 TIMES PER DAY   B-D UF III MINI PEN NEEDLES 31G X 5 MM Misc Generic drug:  Insulin Pen Needle USE 4 TIMES PER DAY   memantine 5 MG tablet Commonly known as:  Namenda Take 1 tablet (5 mg total) by mouth 2 (two) times daily.   metFORMIN 1000 MG tablet Commonly known as:  GLUCOPHAGE TAKE ONE TABLET IN AM AND 1/2 TABLET AT DINNERTIME   mometasone 0.1 % ointment Commonly known as:  Elocon Apply topically daily.   omeprazole 20 MG capsule Commonly known as:  PRILOSEC  Take 20 mg by mouth daily.   ONE TOUCH ULTRA 2 w/Device Kit Use to test blood sugar daily Dx code E11.65   simvastatin 80 MG tablet Commonly known as:  ZOCOR Take 40 mg by mouth at bedtime.   Tyler Aas FlexTouch 100 UNIT/ML Sopn FlexTouch Pen Generic drug:  insulin degludec 16 Units. INJECT 16 UNITS UNDER THE SKIN ONCE DAILY.       Allergies:  Allergies  Allergen Reactions  . Avodart [Dutasteride] Swelling  .  Ibuprofen Nausea And Vomiting  . Morphine Nausea And Vomiting    Past Medical History:  Diagnosis Date  . B12 deficiency   . BPH (benign prostatic hypertrophy)   . Colon polyps   . Diabetes mellitus   . GERD (gastroesophageal reflux disease)    hiatal hernia  . History of kidney stones   . Hyperlipidemia   . Hypertension   . Osteoarthritis     Past Surgical History:  Procedure Laterality Date  . APPENDECTOMY    . CARDIOVASCULAR STRESS TEST  09/02/2011   Post stress myocardial perfusion images show normal pattern of perfusion in all areas. No significant wall abnormalites noted.  Marland Kitchen CAROTID DOPPLER  01/27/2011   Right & Left ICAs 0-49% diameter reduction, right & left subclavian arteries demonstrated less than 50% diameter reduction.  . CYSTOSCOPY WITH BIOPSY  06/17/2012   Procedure: CYSTOSCOPY WITH BIOPSY;  Surgeon: Molli Hazard, MD;  Location: [facility];  Service: Urology;  Laterality: N/A;  . KNEE CARTILAGE SURGERY     Arthroscope  . Partial amputaion left foot    . TRANSTHORACIC ECHOCARDIOGRAM  09/02/2011   EF >55%, normal LV systolic function, mild diastolic dysfunction  . TRANSURETHRAL RESECTION OF PROSTATE  06/17/2012   Procedure: TRANSURETHRAL RESECTION OF THE PROSTATE WITH GYRUS INSTRUMENTS;  Surgeon: Molli Hazard, MD;  Location: [facility];  Service: Urology;  Laterality: N/A;    Family History  Problem Relation Age of Onset  . Heart attack Mother   . Hypertension Mother     Social History:  reports that he has quit smoking. He has never  used smokeless tobacco. He reports that he does not drink alcohol or use drugs.  Review of Systems:  HYPERTENSION: Controlled, only on low-dose atenolol This has been followed by PCP Also on Jardiance 10 mg  HYPERLIPIDEMIA:   Treated with high-dose simvastatin  with good control as of 9/18  Lab Results  Component Value Date   CHOL 125 09/14/2018   HDL 54.00 09/14/2018   LDLCALC 59 09/14/2018   TRIG 58.0 09/14/2018   CHOLHDL 2 09/14/2018   Last foot exam: 07/2017      Examination:   There were no vitals taken for this visit.  There is no height or weight on file to calculate BMI.      ASSESSMENT/ PLAN:    Diabetes type 2 insulin Dependent  See history of present illness for detailed discussion of his current management, blood sugar patterns and problems identified   1C surprisingly improved at 7.3  His blood sugars are totally out of control with blood sugars averaging 350 This is markedly unusual for him since previously A1c was 7.5 He is also symptomatic with hyperglycemia  Part of his hypoglycemia is related to his difficulty remembering to take his insulin and may be getting to do that at least in the evening and may be in the morning also Also has had more stress with his wife and nursing home probably tending to eat out more in the evening  Clearly he is insulin deficient now Although unlikely that stopping Amaryl would cause hypoglycemia at this stage of his diabetes this may have happened before also on one occasion   Recommendations   Restart Amaryl  Check chemistry panel   Change insulin to TRESIBA since his fasting readings may be the highest of the day and unlikely to be getting any higher postprandial readings  Also will not be able to comply with the evening mixed insulin at  suppertime; increasing the dose of premixed insulin may risk hypoglycemia overnight also  Since previously had not been able to manage the V-go pump on his own will not attempt  this  His son was shown how to use the titration sheet given to him to adjust the [facility] based on fasting blood sugars every 3 to 5 days  We will start with 20 units and stop the 70/30 insulin for now  Continue Amaryl, Jardiance and metformin as long as renal function is normal  His son will call next week to give [place] his blood sugar readings for further adjustment  May consider consultation with diabetes educator also    There are no Patient Instructions on file for this visit.    Elayne Snare 12/06/2018, 9:06 PM                     Patient ID: Ardelia Mems Horger, male   DOB: 03-30-1929, 85 y.o.   MRN: [id number]   Reason for Appointment: Diabetes follow-up   History of Present Illness   Diagnosis: Type 2 DIABETES MELITUS, diagnoses 1972   He has had long-standing diabetes and has been on basal bolus insulin regimen after failure of oral hypoglycemic drugs over the last several years His blood sugars have been generally fairly well controlled but has a tendency to high postprandial readings Previously required only a small dose of 5 units of Levemir insulin  Taking Amaryl in addition to his insulin regimen  his blood sugars had improved his blood sugars during the daytime  He had taken Toujeo since 9/16 instead of twice a day Levemir  RECENT history:   Insulin regimen:  Novolog mix 70/30: 15 units at breakfast, 8 acs Oral hypoglycemic drugs: Metformin 1500 mg , Jardiance 10 mg daily   A1c is 11.4 compared to 7.5% previously   Current management, blood sugar patterns and problems identified  His blood sugars are markedly increased since his last visit  According to his son he is probably not taking his evening insulin consistently or sometimes after supper at night because he goes to the nursing home to visit his wife late afternoon  He did not take his insulin last night and today his blood sugar was 541  He has lost weight and also appears to be much more  thirsty and getting more fatigued  His son thinks that he is also more stressed  AMARYL was stopped on his last visit while there medications were continued  He is checking only FASTING blood sugars and forgets to do readings later in the day  He has not changed his diet and also on his fluid intake he is still continuing to use diet drinks or water    Side effects from medications: None          Proper timing of insulin in relation to meals: Yes.          Monitors blood glucose: <1 times a day.    Glucometer: One Touch ultra 2 .          Blood Glucose readings   .Mean values apply above for all meters except median for One Touch  PRE-MEAL Fasting Lunch Dinner Bedtime Overall  Glucose range:  157-541   ?   Mean/median:  349        PREVIOUS MEDIAN 161   Meals: 3 meals per day. Breakfast at 8 am Lunch 12 noon Supper at 5-6 pm; breakfast is usually oatmeal with eggs.  Drinks water mostly and no sweet drinks            Physical activity: exercise: occasionally, bike      Wt Readings from Last 3 Encounters:  09/28/18 176 lb 4 oz (79.9 kg)  09/14/18 181 lb (82.1 kg)  07/13/18 177 lb (80.3 kg)   Lab Results  Component Value Date   HGBA1C 8.2 (A) 09/14/2018   HGBA1C 6.8 (A) 06/09/2018   HGBA1C 7.3 (A) 02/16/2018   Lab Results  Component Value Date   MICROALBUR 6.0 (H) 06/09/2018   LDLCALC 59 09/14/2018   CREATININE 1.24 09/14/2018     Allergies as of 12/07/2018      Reactions   Avodart [dutasteride] Swelling   Ibuprofen Nausea And Vomiting   Morphine Nausea And Vomiting      Medication List       Accurate as of Dec 06, 2018  9:06 PM. If you have any questions, ask your nurse or doctor.        atenolol 25 MG tablet Commonly known as:  TENORMIN TAKE 1 TABLET BY MOUTH EVERY MORNING AND 1/2 TABLET AT BEDTIME   azelastine 0.1 % nasal spray Commonly known as:  ASTELIN Place 1 spray into both nostrils 2 (two) times daily. Use in each nostril as directed    cholecalciferol 1000 units tablet Commonly known as:  VITAMIN D Take 1,000 Units by mouth daily.   clotrimazole-betamethasone cream Commonly known as:  LOTRISONE clotrimazole-betamethasone 1 %-0.05 % topical cream   diclofenac sodium 1 % Gel Commonly known as:  Voltaren Apply 2 g topically 4 (four) times daily.   docusate sodium 100 MG capsule Commonly known as:  COLACE Take 100 mg by mouth 2 (two) times daily.   donepezil 10 MG tablet Commonly known as:  Aricept Take 1 tablet (10 mg total) by mouth at bedtime.   empagliflozin 10 MG Tabs tablet Commonly known as:  Jardiance Take 10 mg by mouth daily with breakfast.   ferrous gluconate 325 MG tablet Commonly known as:  FERGON Take 325 mg by mouth daily with breakfast.   fluticasone 50 MCG/ACT nasal spray Commonly known as:  FLONASE PLACE 2 SPRAYS INTO BOTH NOSTRILS DAILY.   glimepiride 4 MG tablet Commonly known as:  AMARYL TAKE 1 TABLET BY MOUTH DAILY BEFORE SUPPER   glucose blood test strip Commonly known as:  OneTouch Verio Use as instructed to check blood sugar twice a day Dx code E11.65   Insulin Pen Needle 31G X 5 MM Misc Commonly known as:  B-D UF III MINI PEN NEEDLES USE 4 TIMES PER DAY   B-D UF III MINI PEN NEEDLES 31G X 5 MM Misc Generic drug:  Insulin Pen Needle USE 4 TIMES PER DAY   memantine 5 MG tablet Commonly known as:  Namenda Take 1 tablet (5 mg total) by mouth 2 (two) times daily.   metFORMIN 1000 MG tablet Commonly known as:  GLUCOPHAGE TAKE ONE TABLET IN AM AND 1/2 TABLET AT DINNERTIME   mometasone 0.1 % ointment Commonly known as:  Elocon Apply topically daily.   omeprazole 20 MG capsule Commonly known as:  PRILOSEC Take 20 mg by mouth daily.   ONE TOUCH ULTRA 2 w/Device Kit Use to test blood sugar daily Dx code E11.65   simvastatin 80 MG tablet Commonly known as:  ZOCOR Take 40 mg by mouth at bedtime.   Tyler Aas FlexTouch 100 UNIT/ML Sopn FlexTouch Pen Generic drug:   insulin degludec 16 Units. INJECT 16  UNITS UNDER THE SKIN ONCE DAILY.       Allergies:  Allergies  Allergen Reactions  . Avodart [Dutasteride] Swelling  . Ibuprofen Nausea And Vomiting  . Morphine Nausea And Vomiting    Past Medical History:  Diagnosis Date  . B12 deficiency   . BPH (benign prostatic hypertrophy)   . Colon polyps   . Diabetes mellitus   . GERD (gastroesophageal reflux disease)    hiatal hernia  . History of kidney stones   . Hyperlipidemia   . Hypertension   . Osteoarthritis     Past Surgical History:  Procedure Laterality Date  . APPENDECTOMY    . CARDIOVASCULAR STRESS TEST  09/02/2011   Post stress myocardial perfusion images show normal pattern of perfusion in all areas. No significant wall abnormalites noted.  Marland Kitchen CAROTID DOPPLER  01/27/2011   Right & Left ICAs 0-49% diameter reduction, right & left subclavian arteries demonstrated less than 50% diameter reduction.  . CYSTOSCOPY WITH BIOPSY  06/17/2012   Procedure: CYSTOSCOPY WITH BIOPSY;  Surgeon: Molli Hazard, MD;  Location: [facility];  Service: Urology;  Laterality: N/A;  . KNEE CARTILAGE SURGERY     Arthroscope  . Partial amputaion left foot    . TRANSTHORACIC ECHOCARDIOGRAM  09/02/2011   EF >55%, normal LV systolic function, mild diastolic dysfunction  . TRANSURETHRAL RESECTION OF PROSTATE  06/17/2012   Procedure: TRANSURETHRAL RESECTION OF THE PROSTATE WITH GYRUS INSTRUMENTS;  Surgeon: Molli Hazard, MD;  Location: [facility];  Service: Urology;  Laterality: N/A;    Family History  Problem Relation Age of Onset  . Heart attack Mother   . Hypertension Mother     Social History:  reports that he has quit smoking. He has never used smokeless tobacco. He reports that he does not drink alcohol or use drugs.  Review of Systems:  HYPERTENSION:  This is mild, only on low-dose atenolol, followed by PCP Also on Jardiance 10 mg  HYPERLIPIDEMIA:   Treated with 80 mg simvastatin by his PCP  with good control as of 08/2018  Lab Results  Component Value Date   CHOL 125 09/14/2018   HDL 54.00 09/14/2018   LDLCALC 59 09/14/2018   TRIG 58.0 09/14/2018   CHOLHDL 2 09/14/2018   Last foot exam: 07/2017     Examination:   There were no vitals taken for this visit.  There is no height or weight on file to calculate BMI.    ASSESSMENT/ PLAN:    Diabetes type 2 insulin requiring  See history of present illness for detailed discussion of his current management, blood sugar patterns and problems identified  His A1c was last 8.2  Currently with adding Amaryl his blood sugars appear to be significantly better Fasting readings are near normal and as low as 80 Not clear if he is having any hypoglycemia overnight but blood sugars do look lower than the last visit He is somewhat active Also generally doing well with his diet Does get protein with eggs in the morning with breakfast but still has high as 233 which he does not monitor much   Recommendations  Reduce Tresiba to 14 units To call if he is having consistently low normal readings below 100 again More readings after meals Follow-up in 3 months with repeat labs including chemistry panel since he needs to have his renal function monitored regularly   There are no Patient Instructions on file for this visit.    Elayne Snare 12/06/2018, 9:06 PM

## 2018-12-07 ENCOUNTER — Encounter: Payer: Self-pay | Admitting: Endocrinology

## 2018-12-07 ENCOUNTER — Other Ambulatory Visit: Payer: Self-pay

## 2018-12-07 ENCOUNTER — Ambulatory Visit (INDEPENDENT_AMBULATORY_CARE_PROVIDER_SITE_OTHER): Payer: Medicare Other | Admitting: Endocrinology

## 2018-12-07 DIAGNOSIS — E1165 Type 2 diabetes mellitus with hyperglycemia: Secondary | ICD-10-CM

## 2018-12-07 DIAGNOSIS — Z794 Long term (current) use of insulin: Secondary | ICD-10-CM | POA: Diagnosis not present

## 2018-12-21 ENCOUNTER — Other Ambulatory Visit: Payer: Self-pay | Admitting: Podiatry

## 2018-12-31 ENCOUNTER — Other Ambulatory Visit: Payer: Self-pay

## 2018-12-31 MED ORDER — CLOTRIMAZOLE-BETAMETHASONE 1-0.05 % EX CREA: TOPICAL_CREAM | Freq: Every day | CUTANEOUS | 1 refills | Status: DC

## 2019-01-10 ENCOUNTER — Ambulatory Visit: Payer: Medicare Other | Admitting: Podiatry

## 2019-01-24 ENCOUNTER — Ambulatory Visit (INDEPENDENT_AMBULATORY_CARE_PROVIDER_SITE_OTHER): Payer: Medicare Other | Admitting: Podiatry

## 2019-01-24 ENCOUNTER — Encounter: Payer: Self-pay | Admitting: Podiatry

## 2019-01-24 ENCOUNTER — Other Ambulatory Visit: Payer: Self-pay

## 2019-01-24 DIAGNOSIS — M79674 Pain in right toe(s): Secondary | ICD-10-CM | POA: Diagnosis not present

## 2019-01-24 DIAGNOSIS — B351 Tinea unguium: Secondary | ICD-10-CM | POA: Diagnosis not present

## 2019-01-24 DIAGNOSIS — E119 Type 2 diabetes mellitus without complications: Secondary | ICD-10-CM | POA: Insufficient documentation

## 2019-01-24 DIAGNOSIS — M79675 Pain in left toe(s): Secondary | ICD-10-CM

## 2019-01-24 NOTE — Progress Notes (Signed)
Patient ID: [name], male   DOB: October 11, 1928, 85 y.o.   MRN: [id number] Complaint:  Visit Type: Patient returns to my office for continued preventative foot care services. Complaint: Patient states" my nails have grown long and thick and become painful to walk and wear shoes" Patient has been diagnosed with DM with no foot complications. The patient presents for preventative foot care services. No changes to ROS.  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  Podiatric Exam: Vascular: dorsalis pedis and posterior tibial pulses are palpable bilateral. Capillary return is immediate. Temperature gradient is WNL. Skin turgor WNL  Sensorium: Normal Semmes Weinstein monofilament test. Normal tactile sensation bilaterally. Nail Exam: Pt has thick disfigured discolored nails with subungual debris noted bilateral entire nail hallux through fifth toenails Ulcer Exam: There is no evidence of ulcer or pre-ulcerative changes or infection. Orthopedic Exam: Muscle tone and strength are WNL. No limitations in general ROM. No crepitus or effusions noted. Foot type and digits show no abnormalities. Bony prominences are unremarkable. Skin:  Porokeratosis   plantar aspect left hallux. Asymptomatic. Marland Kitchen No infection or ulcers  Diagnosis:  Onychomycosis, , Pain in right toe, pain in left toes,    Treatment & Plan Procedures and Treatment: Consent by patient was obtained for treatment procedures. The patient understood the discussion of treatment and procedures well. All questions were answered thoroughly reviewed. Debridement of mycotic and hypertrophic toenails, 1 through 5 bilateral and clearing of subungual debris. No ulceration, no infection noted.  Return Visit-Office Procedure: Patient instructed to return to the office for a follow up visit 3 months for continued evaluation and treatment.    Gardiner Barefoot DPM

## 2019-03-01 LAB — BASIC METABOLIC PANEL: Creatinine: 1.1 (ref 0.6–1.3)

## 2019-03-01 LAB — LIPID PANEL: LDL Cholesterol: 59

## 2019-03-01 LAB — MICROALBUMIN, URINE: Microalb, Ur: 28.4

## 2019-03-02 ENCOUNTER — Ambulatory Visit: Payer: Medicare Other | Admitting: Endocrinology

## 2019-03-07 ENCOUNTER — Ambulatory Visit: Payer: Medicare Other | Admitting: Endocrinology

## 2019-03-15 ENCOUNTER — Telehealth: Payer: Self-pay | Admitting: Endocrinology

## 2019-03-15 NOTE — Telephone Encounter (Signed)
Paperwork was faxed to them this morning, but it is unlikely to be approved, as pt only checks his blood sugar once daily, per last office visit note.

## 2019-03-15 NOTE — Telephone Encounter (Signed)
[facility] ph# [phone] called to check status of a fax they sent re: [employer] 03/11/19. Please call the above contact to advise. Fax# [phone].

## 2019-03-16 ENCOUNTER — Other Ambulatory Visit: Payer: Self-pay | Admitting: Endocrinology

## 2019-03-21 ENCOUNTER — Ambulatory Visit (INDEPENDENT_AMBULATORY_CARE_PROVIDER_SITE_OTHER): Payer: Medicare Other | Admitting: Endocrinology

## 2019-03-21 ENCOUNTER — Other Ambulatory Visit: Payer: Self-pay

## 2019-03-21 ENCOUNTER — Encounter: Payer: Self-pay | Admitting: Endocrinology

## 2019-03-21 VITALS — BP 108/58 | HR 60 | Ht 69.0 in | Wt 185.6 lb

## 2019-03-21 DIAGNOSIS — I952 Hypotension due to drugs: Secondary | ICD-10-CM

## 2019-03-21 DIAGNOSIS — E7800 Pure hypercholesterolemia, unspecified: Secondary | ICD-10-CM | POA: Diagnosis not present

## 2019-03-21 DIAGNOSIS — E1165 Type 2 diabetes mellitus with hyperglycemia: Secondary | ICD-10-CM | POA: Diagnosis not present

## 2019-03-21 DIAGNOSIS — Z794 Long term (current) use of insulin: Secondary | ICD-10-CM | POA: Diagnosis not present

## 2019-03-21 LAB — POCT GLYCOSYLATED HEMOGLOBIN (HGB A1C): Hemoglobin A1C: 8.1 % — AB (ref 4.0–5.6)

## 2019-03-21 NOTE — Progress Notes (Signed)
Patient ID: [name], male   DOB: 07-22-1929, 85 y.o.   MRN: [id number]     Reason for Appointment: Diabetes follow-up   History of Present Illness   Diagnosis: Type 2 DIABETES MELITUS, diagnoses 1972   He has had long-standing diabetes and has been on basal bolus insulin regimen after failure of oral hypoglycemic drugs over the last several years His blood sugars have been generally fairly well controlled but has a tendency to high postprandial readings Previously required only a small dose of 5 units of Levemir insulin  Taking Amaryl in addition to his insulin regimen  his blood sugars had improved his blood sugars during the daytime  He had taken Toujeo since 9/16 instead of twice a day Levemir  RECENT history:   Insulin regimen:  TRESIBA 14 units at breakfast  Oral hypoglycemic drugs: Metformin 1500 mg , Jardiance 10 mg daily Amaryl 4 mg before dinner  A1c was last 8.2 and now 8.1  Current management, blood sugar patterns and problems identified  His meter has the wrong date programmed and not clear when he had his last glucose checked  As before he checks his blood sugars routinely infrequently and likely has checked only on 3 mornings in the last 2 weeks  Although his son is trying to help him with his daily management he is not able to enforce his monitoring his blood sugars especially in the evenings even though his family comes to give him dinner  The last 3 months he has apparently woken up once or twice feeling sweaty early morning around 6:30 AM and he thinks his blood sugars were around 70 with the symptom  Tyler Aas was reduced by 2 units and he was told to reduce it further if morning sugars were low but he did not do so  He is somewhat active with activities [place] but not doing any formal exercise like riding the bike  However has gained 9 pounds since March  His morning meal is usually high in carbohydrate including oatmeal, fruit  along with eggs  He thinks he is taking his medications regularly and usually compliant with this with using the pillbox that his son has been feeling  Postprandial glucose: Has not been checked, after breakfast at the bedside fasting blood sugar was 273 and today in the office after lunch it is 214    Side effects from medications: None          Proper timing of insulin in relation to meals: Yes.          Monitors blood glucose: <1 times a day.    Glucometer: One [employer] .          Blood Glucose readings from  meter download  FASTING blood sugar range 74-194 recently   Previous readings from the last 3 months: Range 74-431, average 152   Meals: 3 meals per day. Breakfast at 8 am Lunch 12 noon Supper at 5-6 pm Drinks water mostly and no sweet drinks    Wt Readings from Last 3 Encounters:  03/21/19 185 lb 9.6 oz (84.2 kg)  09/28/18 176 lb 4 oz (79.9 kg)  09/14/18 181 lb (82.1 kg)   Lab Results  Component Value Date   HGBA1C 8.1 (A) 03/21/2019   HGBA1C 8.2 (A) 09/14/2018   HGBA1C 6.8 (A) 06/09/2018   Lab Results  Component Value Date   MICROALBUR 28.4 03/01/2019   LDLCALC 59 03/01/2019   CREATININE 1.1 03/01/2019  Allergies as of 03/21/2019      Reactions   Avodart [dutasteride] Swelling   Ibuprofen Nausea And Vomiting   Morphine Nausea And Vomiting      Medication List       Accurate as of March 21, 2019  9:13 PM. If you have any questions, ask your nurse or doctor.        atenolol 25 MG tablet Commonly known as: TENORMIN   azelastine 0.1 % nasal spray Commonly known as: ASTELIN Place 1 spray into both nostrils 2 (two) times daily. Use in each nostril as directed   cholecalciferol 1000 units tablet Commonly known as: VITAMIN D Take 1,000 Units by mouth daily.   clotrimazole-betamethasone cream Commonly known as: LOTRISONE Apply topically daily.   diclofenac sodium 1 % Gel Commonly known as: Voltaren Apply 2 g topically 4 (four) times  daily.   docusate sodium 100 MG capsule Commonly known as: COLACE Take 100 mg by mouth 2 (two) times daily.   donepezil 10 MG tablet Commonly known as: Aricept Take 1 tablet (10 mg total) by mouth at bedtime.   empagliflozin 10 MG Tabs tablet Commonly known as: Jardiance Take 10 mg by mouth daily with breakfast.   ferrous gluconate 325 MG tablet Commonly known as: FERGON Take 325 mg by mouth daily with breakfast.   fluticasone 50 MCG/ACT nasal spray Commonly known as: FLONASE PLACE 2 SPRAYS INTO BOTH NOSTRILS DAILY.   glimepiride 4 MG tablet Commonly known as: AMARYL   glucose blood test strip Commonly known as: OneTouch Verio Use as instructed to check blood sugar twice a day Dx code E11.65   Insulin Pen Needle 31G X 5 MM Misc Commonly known as: B-D UF III MINI PEN NEEDLES USE 4 TIMES PER DAY   B-D UF III MINI PEN NEEDLES 31G X 5 MM Misc Generic drug: Insulin Pen Needle USE 4 TIMES PER DAY   memantine 5 MG tablet Commonly known as: Namenda Take 1 tablet (5 mg total) by mouth 2 (two) times daily.   metFORMIN 1000 MG tablet Commonly known as: GLUCOPHAGE TAKE ONE TABLET IN AM AND 1/2 TABLET AT DINNERTIME   mometasone 0.1 % ointment Commonly known as: Elocon Apply topically daily.   omeprazole 20 MG capsule Commonly known as: PRILOSEC Take 20 mg by mouth daily.   ONE TOUCH ULTRA 2 w/Device Kit Use to test blood sugar daily Dx code E11.65   simvastatin 80 MG tablet Commonly known as: ZOCOR Take 40 mg by mouth at bedtime.   Tresiba 100 UNIT/ML Soln Generic drug: Insulin Degludec Inject 14 Units into the skin daily. Inject 14 units under the skin once daily. What changed: Another medication with the same name was removed. Continue taking this medication, and follow the directions you see here. Changed by: Elayne Snare, MD       Allergies:  Allergies  Allergen Reactions  . Avodart [Dutasteride] Swelling  . Ibuprofen Nausea And Vomiting  . Morphine  Nausea And Vomiting    Past Medical History:  Diagnosis Date  . B12 deficiency   . BPH (benign prostatic hypertrophy)   . Colon polyps   . Diabetes mellitus   . GERD (gastroesophageal reflux disease)    hiatal hernia  . History of kidney stones   . Hyperlipidemia   . Hypertension   . Osteoarthritis     Past Surgical History:  Procedure Laterality Date  . APPENDECTOMY    . CARDIOVASCULAR STRESS TEST  09/02/2011   Post stress myocardial  perfusion images show normal pattern of perfusion in all areas. No significant wall abnormalites noted.  Marland Kitchen CAROTID DOPPLER  01/27/2011   Right & Left ICAs 0-49% diameter reduction, right & left subclavian arteries demonstrated less than 50% diameter reduction.  . CYSTOSCOPY WITH BIOPSY  06/17/2012   Procedure: CYSTOSCOPY WITH BIOPSY;  Surgeon: Molli Hazard, MD;  Location: [facility];  Service: Urology;  Laterality: N/A;  . KNEE CARTILAGE SURGERY     Arthroscope  . Partial amputaion left foot    . TRANSTHORACIC ECHOCARDIOGRAM  09/02/2011   EF >55%, normal LV systolic function, mild diastolic dysfunction  . TRANSURETHRAL RESECTION OF PROSTATE  06/17/2012   Procedure: TRANSURETHRAL RESECTION OF THE PROSTATE WITH GYRUS INSTRUMENTS;  Surgeon: Molli Hazard, MD;  Location: [facility];  Service: Urology;  Laterality: N/A;    Family History  Problem Relation Age of Onset  . Heart attack Mother   . Hypertension Mother     Social History:  reports that he has quit smoking. He has never used smokeless tobacco. He reports that he does not drink alcohol or use drugs.  Review of Systems:  HYPERTENSION: Controlled, only on low-dose atenolol This has been followed by PCP Also on Jardiance 10 mg  HYPERLIPIDEMIA:   Treated with high-dose simvastatin  with good control as of 9/18  Lab Results  Component Value Date   CHOL 125 09/14/2018   HDL 54.00 09/14/2018   LDLCALC 59 03/01/2019   TRIG 58.0 09/14/2018   CHOLHDL 2 09/14/2018   Last foot  exam: 07/2017      Examination:   BP (!) 108/58 (Patient Position: Standing)   Pulse 60   Ht _0  (1.753 m)   Wt 185 lb 9.6 oz (84.2 kg)   SpO2 96%   BMI 27.41 kg/m   Body mass index is 27.41 kg/m.      ASSESSMENT/ PLAN:    Diabetes type 2 insulin Dependent  See history of present illness for detailed discussion of his current management, blood sugar patterns and problems identified   1C surprisingly improved at 7.3  His blood sugars are totally out of control with blood sugars averaging 350 This is markedly unusual for him since previously A1c was 7.5 He is also symptomatic with hyperglycemia  Part of his hypoglycemia is related to his difficulty remembering to take his insulin and may be getting to do that at least in the evening and may be in the morning also Also has had more stress with his wife and nursing home probably tending to eat out more in the evening  Clearly he is insulin deficient now Although unlikely that stopping Amaryl would cause hypoglycemia at this stage of his diabetes this may have happened before also on one occasion   Recommendations   Restart Amaryl  Check chemistry panel   Change insulin to TRESIBA since his fasting readings may be the highest of the day and unlikely to be getting any higher postprandial readings  Also will not be able to comply with the evening mixed insulin at suppertime; increasing the dose of premixed insulin may risk hypoglycemia overnight also  Since previously had not been able to manage the V-go pump on his own will not attempt this  His son was shown how to use the titration sheet given to him to adjust the [facility] based on fasting blood sugars every 3 to 5 days  We will start with 20 units and stop the 70/30 insulin for now  Continue  Amaryl, Jardiance and metformin as long as renal function is normal  His son will call next week to give [place] his blood sugar readings for further adjustment  May consider  consultation with diabetes educator also    Patient Instructions  Glimeperide in am not pm   Stop Metformin in pm  Check blood sugars on waking up days a week  Also check blood sugars about 2 hours after meals and do this after different meals by rotation  Recommended blood sugar levels on waking up are 90-150 and about 2 hours after meal is 130-200  Please bring your blood sugar monitor to each visit, thank you         Elayne Snare 03/21/2019, 9:13 PM                     Patient ID: [name], male   DOB: 01-26-1929, 85 y.o.   MRN: [id number]   Reason for Appointment: Diabetes follow-up   History of Present Illness   Diagnosis: Type 2 DIABETES MELITUS, diagnoses 1972   He has had long-standing diabetes and has been on basal bolus insulin regimen after failure of oral hypoglycemic drugs over the last several years His blood sugars have been generally fairly well controlled but has a tendency to high postprandial readings Previously required only a small dose of 5 units of Levemir insulin  Taking Amaryl in addition to his insulin regimen  his blood sugars had improved his blood sugars during the daytime  He had taken Toujeo since 9/16 instead of twice a day Levemir  RECENT history:   Insulin regimen:  Novolog mix 70/30: 15 units at breakfast, 8 acs Oral hypoglycemic drugs: Metformin 1500 mg , Jardiance 10 mg daily   A1c is 11.4 compared to 7.5% previously   Current management, blood sugar patterns and problems identified  His blood sugars are markedly increased since his last visit  According to his son he is probably not taking his evening insulin consistently or sometimes after supper at night because he goes to the nursing home to visit his wife late afternoon  He did not take his insulin last night and today his blood sugar was 541  He has lost weight and also appears to be much more thirsty and getting more fatigued  His son thinks that  he is also more stressed  AMARYL was stopped on his last visit while there medications were continued  He is checking only FASTING blood sugars and forgets to do readings later in the day  He has not changed his diet and also on his fluid intake he is still continuing to use diet drinks or water    Side effects from medications: None          Proper timing of insulin in relation to meals: Yes.          Monitors blood glucose: <1 times a day.    Glucometer: One Touch ultra 2 .          Blood Glucose readings   .Mean values apply above for all meters except median for One Touch  PRE-MEAL Fasting Lunch Dinner Bedtime Overall  Glucose range:  157-541   ?   Mean/median:  349        PREVIOUS MEDIAN 161   Meals: 3 meals per day. Breakfast at 8 am Lunch 12 noon Supper at 5-6 pm; breakfast is usually oatmeal with eggs.  Drinks water mostly and no sweet drinks  Physical activity: exercise: occasionally, bike      Wt Readings from Last 3 Encounters:  03/21/19 185 lb 9.6 oz (84.2 kg)  09/28/18 176 lb 4 oz (79.9 kg)  09/14/18 181 lb (82.1 kg)   Lab Results  Component Value Date   HGBA1C 8.1 (A) 03/21/2019   HGBA1C 8.2 (A) 09/14/2018   HGBA1C 6.8 (A) 06/09/2018   Lab Results  Component Value Date   MICROALBUR 28.4 03/01/2019   LDLCALC 59 03/01/2019   CREATININE 1.1 03/01/2019     Allergies as of 03/21/2019      Reactions   Avodart [dutasteride] Swelling   Ibuprofen Nausea And Vomiting   Morphine Nausea And Vomiting      Medication List       Accurate as of March 21, 2019  9:13 PM. If you have any questions, ask your nurse or doctor.        atenolol 25 MG tablet Commonly known as: TENORMIN   azelastine 0.1 % nasal spray Commonly known as: ASTELIN Place 1 spray into both nostrils 2 (two) times daily. Use in each nostril as directed   cholecalciferol 1000 units tablet Commonly known as: VITAMIN D Take 1,000 Units by mouth daily.    clotrimazole-betamethasone cream Commonly known as: LOTRISONE Apply topically daily.   diclofenac sodium 1 % Gel Commonly known as: Voltaren Apply 2 g topically 4 (four) times daily.   docusate sodium 100 MG capsule Commonly known as: COLACE Take 100 mg by mouth 2 (two) times daily.   donepezil 10 MG tablet Commonly known as: Aricept Take 1 tablet (10 mg total) by mouth at bedtime.   empagliflozin 10 MG Tabs tablet Commonly known as: Jardiance Take 10 mg by mouth daily with breakfast.   ferrous gluconate 325 MG tablet Commonly known as: FERGON Take 325 mg by mouth daily with breakfast.   fluticasone 50 MCG/ACT nasal spray Commonly known as: FLONASE PLACE 2 SPRAYS INTO BOTH NOSTRILS DAILY.   glimepiride 4 MG tablet Commonly known as: AMARYL   glucose blood test strip Commonly known as: OneTouch Verio Use as instructed to check blood sugar twice a day Dx code E11.65   Insulin Pen Needle 31G X 5 MM Misc Commonly known as: B-D UF III MINI PEN NEEDLES USE 4 TIMES PER DAY   B-D UF III MINI PEN NEEDLES 31G X 5 MM Misc Generic drug: Insulin Pen Needle USE 4 TIMES PER DAY   memantine 5 MG tablet Commonly known as: Namenda Take 1 tablet (5 mg total) by mouth 2 (two) times daily.   metFORMIN 1000 MG tablet Commonly known as: GLUCOPHAGE TAKE ONE TABLET IN AM AND 1/2 TABLET AT DINNERTIME   mometasone 0.1 % ointment Commonly known as: Elocon Apply topically daily.   omeprazole 20 MG capsule Commonly known as: PRILOSEC Take 20 mg by mouth daily.   ONE TOUCH ULTRA 2 w/Device Kit Use to test blood sugar daily Dx code E11.65   simvastatin 80 MG tablet Commonly known as: ZOCOR Take 40 mg by mouth at bedtime.   Tresiba 100 UNIT/ML Soln Generic drug: Insulin Degludec Inject 14 Units into the skin daily. Inject 14 units under the skin once daily. What changed: Another medication with the same name was removed. Continue taking this medication, and follow the directions  you see here. Changed by: Elayne Snare, MD       Allergies:  Allergies  Allergen Reactions  . Avodart [Dutasteride] Swelling  . Ibuprofen Nausea And Vomiting  . Morphine  Nausea And Vomiting    Past Medical History:  Diagnosis Date  . B12 deficiency   . BPH (benign prostatic hypertrophy)   . Colon polyps   . Diabetes mellitus   . GERD (gastroesophageal reflux disease)    hiatal hernia  . History of kidney stones   . Hyperlipidemia   . Hypertension   . Osteoarthritis     Past Surgical History:  Procedure Laterality Date  . APPENDECTOMY    . CARDIOVASCULAR STRESS TEST  09/02/2011   Post stress myocardial perfusion images show normal pattern of perfusion in all areas. No significant wall abnormalites noted.  Marland Kitchen CAROTID DOPPLER  01/27/2011   Right & Left ICAs 0-49% diameter reduction, right & left subclavian arteries demonstrated less than 50% diameter reduction.  . CYSTOSCOPY WITH BIOPSY  06/17/2012   Procedure: CYSTOSCOPY WITH BIOPSY;  Surgeon: Molli Hazard, MD;  Location: [facility];  Service: Urology;  Laterality: N/A;  . KNEE CARTILAGE SURGERY     Arthroscope  . Partial amputaion left foot    . TRANSTHORACIC ECHOCARDIOGRAM  09/02/2011   EF >55%, normal LV systolic function, mild diastolic dysfunction  . TRANSURETHRAL RESECTION OF PROSTATE  06/17/2012   Procedure: TRANSURETHRAL RESECTION OF THE PROSTATE WITH GYRUS INSTRUMENTS;  Surgeon: Molli Hazard, MD;  Location: [facility];  Service: Urology;  Laterality: N/A;    Family History  Problem Relation Age of Onset  . Heart attack Mother   . Hypertension Mother     Social History:  reports that he has quit smoking. He has never used smokeless tobacco. He reports that he does not drink alcohol or use drugs.  Review of Systems:  HYPERTENSION:  This is mild, only taking atenolol, followed by PCP However has not had any recent visit with PCP No lightheadedness  Also on Jardiance 10 mg Initial blood pressure  sitting was 90/44 today  BP Readings from Last 3 Encounters:  03/21/19 (!) 108/58  09/28/18 112/74  09/14/18 (!) 110/50     HYPERLIPIDEMIA:   Treated with 80 mg simvastatin by his PCP with good control Recent labs from [facility] were available  Lab Results  Component Value Date   CHOL 125 09/14/2018   HDL 54.00 09/14/2018   LDLCALC 59 03/01/2019   TRIG 58.0 09/14/2018   CHOLHDL 2 09/14/2018   Last foot exam: 09/2018  Last eye exam 7/19   Examination:   BP (!) 108/58 (Patient Position: Standing)   Pulse 60   Ht _0  (1.753 m)   Wt 185 lb 9.6 oz (84.2 kg)   SpO2 96%   BMI 27.41 kg/m   Body mass index is 27.41 kg/m.    ASSESSMENT/ PLAN:    Diabetes type 2 insulin requiring  See history of present illness for detailed discussion of his current management, blood sugar patterns and problems identified  His A1c is 8.1  This may be adequate for his age and duration of diabetes  He does have postprandial hyperglycemia as seen on the recent labs and today in the office However occasionally appears to have low normal readings in the mornings based on his diet the night before or activity level Probably getting too much carbohydrate at breakfast with protein oatmeal  Low blood pressure: This is probably from combination of taking atenolol and Jardiance, pulse is also relatively slow Has not had any recent follow-up with PCP   Recommendations  Discussed blood sugar target Advised his son to help him with checking blood sugars more  regularly and especially after supper Currently his test trips are not expired and he will make sure that they are usually current Change Amaryl to the morning before breakfast instead of dinnertime for better blood sugar profile and less overnight hypoglycemia tendency Also will stop the half metformin in the evening We will make sure he has some snacks before going out to do a lot of significant physical work  Likely needs to reduce  his atenolol or stop this because of relatively low blood pressure in combination with Jardiance  Also recommend annual eye exams  Total visit time for evaluation and management of multiple problems and counseling =25 minutes  Patient Instructions  Glimeperide in am not pm   Stop Metformin in pm  Check blood sugars on waking up days a week  Also check blood sugars about 2 hours after meals and do this after different meals by rotation  Recommended blood sugar levels on waking up are 90-150 and about 2 hours after meal is 130-200  Please bring your blood sugar monitor to each visit, thank you         Elayne Snare 03/21/2019, 9:13 PM

## 2019-03-21 NOTE — Patient Instructions (Signed)
Glimeperide in am not pm   Stop Metformin in pm  Check blood sugars on waking up days a week  Also check blood sugars about 2 hours after meals and do this after different meals by rotation  Recommended blood sugar levels on waking up are 90-150 and about 2 hours after meal is 130-200  Please bring your blood sugar monitor to each visit, thank you

## 2019-03-23 ENCOUNTER — Telehealth: Payer: Self-pay | Admitting: Family Medicine

## 2019-03-23 NOTE — Telephone Encounter (Signed)
-----   Message from Susy Frizzle, MD sent at 03/22/2019  6:28 AM EDT ----- Regarding: RE: Blood pressure Please have patient stop atenolol due to low blood pressure.  Recheck BP here next week.  ----- Message ----- From: Elayne Snare, MD Sent: 03/21/2019   9:14 PM EDT To: Susy Frizzle, MD Subject: Blood pressure                                 [name]: His blood pressure was low as low as 90 systolic today, likely from taking both Jardiance and atenolol.  Would consider reducing his atenolol dose, thanksRenal function was okay with done from the [facility]

## 2019-03-23 NOTE — Telephone Encounter (Signed)
Pt's son aware and appt made for Sept 3 at [facility]

## 2019-03-28 ENCOUNTER — Telehealth: Payer: Self-pay | Admitting: Endocrinology

## 2019-03-28 ENCOUNTER — Telehealth: Payer: Self-pay

## 2019-03-28 NOTE — Telephone Encounter (Signed)
Called pt's son and gave him MD message. Pt's son verbalized understanding and wrote new insulin instructions down.  Pt's son stated that the pt is taking Metformin 1 tab at night, and Glimepiride 1/2 tablet in the morning and 1/2 tablet at night. Was scheduled for 2 week f/u.

## 2019-03-28 NOTE — Telephone Encounter (Signed)
1.0 g. metformin at breakfast and dinner  and 1/2 tab glimepiride just before eating breakfast and dinner

## 2019-03-28 NOTE — Telephone Encounter (Signed)
I have talked to the patient through the team health on the weekend with the more recent information

## 2019-03-28 NOTE — Telephone Encounter (Signed)
How many tabs of each? This is what I was needing clarified that you did not answer below.

## 2019-03-28 NOTE — Telephone Encounter (Signed)
He is supposed to take metformin at breakfast and dinner both times and glimepiride just before eating breakfast and dinner

## 2019-03-28 NOTE — Telephone Encounter (Signed)
Pt's son called back and he was given MD message. Pts son became angry and stated that "blood sugars have been high since Dr. Dwyane Dee changed the medication." Pt's son was informed that Dr. Dwyane Dee insists on the 5 days of blood sugar readings, and son stated that he can call us back once he gets home.

## 2019-03-28 NOTE — Telephone Encounter (Signed)
So patient needs to take Metformin and glimepiride at breakfast and dinner?

## 2019-03-28 NOTE — Telephone Encounter (Signed)
I also need to know whether the last few blood sugars have been fasting or after meals

## 2019-03-28 NOTE — Telephone Encounter (Signed)
Please get a list of all the blood sugars in the last 5 days with time of day.  On Saturday his metformin was increased to 1 tablet twice a day and Amaryl changed to half tablet twice a day

## 2019-03-28 NOTE — Telephone Encounter (Signed)
Pt's son stated that they have all been fasting.

## 2019-03-28 NOTE — Telephone Encounter (Signed)
Assuming that blood sugar reading was 510 before breakfast on Saturday as in the note below will have him increase his Tresiba from 14 up to 18 units, also please confirm doses of glimepiride and metformin.  Also will need to have him follow-up in 2 weeks instead of in November.  Virtual visit okay without labs

## 2019-03-28 NOTE — Telephone Encounter (Signed)
Copy of my instructions from below, instructions not changed:  He is supposed to take metformin at breakfast and dinner both times and glimepiride just before eating breakfast and dinner

## 2019-03-28 NOTE — Telephone Encounter (Signed)
Last office visit noted states "Glimeperide in am not pm' and "Stop Metformin in pm is this what your last message was intended to say?

## 2019-03-28 NOTE — Telephone Encounter (Signed)
Pt's son called and stated that after taking the pt off metformin and changing Glimepiride to morning, the pt woke up and sugar was 321 fasting.

## 2019-03-28 NOTE — Telephone Encounter (Signed)
Patients son has called in regards to the change in the patients medication and states yesterday his blood sugars read 510  Please Advise, Thanks

## 2019-03-28 NOTE — Telephone Encounter (Signed)
[facility] has called stating they sent [place] a fax 1 hour ago. Please Advise, Thanks   Ph#  [phone] Fax # [phone]

## 2019-03-28 NOTE — Telephone Encounter (Signed)
Called pt's son and gave him MD message. Pt's son verbalized understanding

## 2019-03-31 ENCOUNTER — Other Ambulatory Visit: Payer: Self-pay

## 2019-03-31 ENCOUNTER — Ambulatory Visit (INDEPENDENT_AMBULATORY_CARE_PROVIDER_SITE_OTHER): Payer: Medicare Other | Admitting: Family Medicine

## 2019-03-31 VITALS — BP 120/68 | HR 90 | Temp 98.0°F | Resp 18 | Ht 69.0 in | Wt 187.0 lb

## 2019-03-31 DIAGNOSIS — Z23 Encounter for immunization: Secondary | ICD-10-CM

## 2019-03-31 DIAGNOSIS — Z794 Long term (current) use of insulin: Secondary | ICD-10-CM | POA: Diagnosis not present

## 2019-03-31 DIAGNOSIS — E119 Type 2 diabetes mellitus without complications: Secondary | ICD-10-CM | POA: Diagnosis not present

## 2019-03-31 NOTE — Progress Notes (Signed)
Subjective:    Patient ID: [name], male    DOB: October 18, 1928, 85 y.o.   MRN: [id number]  Medication Refill  06/2018 Patient is a very pleasant 85 year old Caucasian male here today with his son.  I have not seen the patient in more than a year.  Since I last saw the patient, his wife died due to complications from Alzheimer's disease.  His memory loss is also steadily worsened.  His son states that he is falling more at home.  Recently he fell trying to get wood from the backyard for his home.  His son manages his affairs and lays out his medication in a pillbox to ensure that he is taking it properly however the patient still lives independently.  Family members check on him daily.  He is no longer driving.  However he increasingly is more forgetful.  He has no recollection of the last time he saw me.  He states that the pain in his knees began 2 years ago.  His son states that he has been dealing with severe pain in his knees for more than 5 years.  He has no recollection of the orthopedist giving him Visco supplementation injections recently.  Patient is currently only on 5 mg of Aricept and the son is curious if we can increase that medication to help delay the progression of the dementia further.  He is also curious if we can give him something stronger for pain.  He is taking 1200 mg of Tylenol daily to manage the pain in his right knee.  He has had Visco supplementation injections x3 in the right knee yet the pain is severe.  We had a long discussion today regarding the risk of narcotic pain medication including increasing confusion, delirium, sedation, and increasing fall risk.  Therefore the son does not want to give this medication to him because his dementia is already worsening.  At that time, my plan was: Avoid opiates for knee pain due to concern over worsening confusion, and increased fall risk.  Instead use Voltaren gel 2 g 4 times daily in addition to Tylenol thousand milligrams  twice daily.  Increase Aricept to 10 mg a day and add Namenda in 1 month if the patient is tolerating the Aricept without difficulty  03/31/19 Patient recently saw his endocrinologist.  I have copied his endocrinologist recommendations below for reference:  "Discussed blood sugar target Advised his son to help him with checking blood sugars more regularly and especially after supper Currently his test trips are not expired and he will make sure that they are usually current Change Amaryl to the morning before breakfast instead of dinnertime for better blood sugar profile and less overnight hypoglycemia tendency Also will stop the half metformin in the evening We will make sure he has some snacks before going out to do a lot of significant physical work Likely needs to reduce his atenolol or stop this because of relatively low blood pressure in combination with Jardiance"  Since discontinuing his atenolol, his blood pressure is much better.  Today he is 120/68.  His heart rate is 90.  He denies any palpitations.  He denies any tachycardia.  He denies any chest pain or shortness of breath.  His blood sugars have been highly variable.  Blood sugars in the morning have been in the mid 200s.  Blood sugars in the evening have been as high as 500.  His Tyler Aas was recently increased from 14 to 18 units.  They  have yet to see a drop in his blood sugars with this.  His family is upset with communication from his endocrinologist office.  Apparently there was some miscommunication regarding how to manage his blood sugars.  Patient son is adamant that he would like a second opinion.  Despite my insurances he insist upon a second opinion.  Past Medical History:  Diagnosis Date  . B12 deficiency   . BPH (benign prostatic hypertrophy)   . Colon polyps   . Diabetes mellitus   . GERD (gastroesophageal reflux disease)    hiatal hernia  . History of kidney stones   . Hyperlipidemia   . Hypertension   .  Osteoarthritis    Past Surgical History:  Procedure Laterality Date  . APPENDECTOMY    . CARDIOVASCULAR STRESS TEST  09/02/2011   Post stress myocardial perfusion images show normal pattern of perfusion in all areas. No significant wall abnormalites noted.  Marland Kitchen CAROTID DOPPLER  01/27/2011   Right & Left ICAs 0-49% diameter reduction, right & left subclavian arteries demonstrated less than 50% diameter reduction.  . CYSTOSCOPY WITH BIOPSY  06/17/2012   Procedure: CYSTOSCOPY WITH BIOPSY;  Surgeon: Molli Hazard, MD;  Location: [facility];  Service: Urology;  Laterality: N/A;  . KNEE CARTILAGE SURGERY     Arthroscope  . Partial amputaion left foot    . TRANSTHORACIC ECHOCARDIOGRAM  09/02/2011   EF >55%, normal LV systolic function, mild diastolic dysfunction  . TRANSURETHRAL RESECTION OF PROSTATE  06/17/2012   Procedure: TRANSURETHRAL RESECTION OF THE PROSTATE WITH GYRUS INSTRUMENTS;  Surgeon: Molli Hazard, MD;  Location: [facility];  Service: Urology;  Laterality: N/A;   Current Outpatient Medications on File Prior to Visit  Medication Sig Dispense Refill  . atenolol (TENORMIN) 25 MG tablet     . azelastine (ASTELIN) 0.1 % nasal spray Place 1 spray into both nostrils 2 (two) times daily. Use in each nostril as directed 30 mL 3  . B-D UF III MINI PEN NEEDLES 31G X 5 MM MISC USE 4 TIMES PER DAY 100 each 2  . Blood Glucose Monitoring Suppl (ONE TOUCH ULTRA 2) w/Device KIT Use to test blood sugar daily Dx code E11.65 1 each 1  . cholecalciferol (VITAMIN D) 1000 UNITS tablet Take 1,000 Units by mouth daily.    . clotrimazole-betamethasone (LOTRISONE) cream Apply topically daily. 30 g 1  . diclofenac sodium (VOLTAREN) 1 % GEL Apply 2 g topically 4 (four) times daily. 100 g 1  . docusate sodium (COLACE) 100 MG capsule Take 100 mg by mouth 2 (two) times daily.    Marland Kitchen donepezil (ARICEPT) 10 MG tablet Take 1 tablet (10 mg total) by mouth at bedtime. 90 tablet 3  . empagliflozin (JARDIANCE) 10 MG  TABS tablet Take 10 mg by mouth daily with breakfast. 90 tablet 3  . ferrous gluconate (FERGON) 325 MG tablet Take 325 mg by mouth daily with breakfast.    . fluticasone (FLONASE) 50 MCG/ACT nasal spray PLACE 2 SPRAYS INTO BOTH NOSTRILS DAILY. 48 g 5  . glimepiride (AMARYL) 4 MG tablet     . glucose blood (ONETOUCH VERIO) test strip Use as instructed to check blood sugar twice a day Dx code E11.65 100 each 12  . Insulin Degludec (TRESIBA) 100 UNIT/ML SOLN Inject 14 Units into the skin daily. Inject 14 units under the skin once daily.    . Insulin Pen Needle (B-D UF III MINI PEN NEEDLES) 31G X 5 MM MISC USE 4 TIMES  PER DAY 125 each 3  . memantine (NAMENDA) 5 MG tablet Take 1 tablet (5 mg total) by mouth 2 (two) times daily. 180 tablet 3  . metFORMIN (GLUCOPHAGE) 1000 MG tablet TAKE ONE TABLET IN AM AND 1/2 TABLET AT DINNERTIME 135 tablet 2  . mometasone (ELOCON) 0.1 % ointment Apply topically daily. 45 g 0  . omeprazole (PRILOSEC) 20 MG capsule Take 20 mg by mouth daily.      . simvastatin (ZOCOR) 80 MG tablet Take 40 mg by mouth at bedtime.     Current Facility-Administered Medications on File Prior to Visit  Medication Dose Route Frequency Provider Last Rate Last Dose  . cyanocobalamin ((VITAMIN B-12)) injection 1,000 mcg  1,000 mcg Intramuscular Q30 days Susy Frizzle, MD   1,000 mcg at 09/28/18 1629   Allergies  Allergen Reactions  . Avodart [Dutasteride] Swelling  . Ibuprofen Nausea And Vomiting  . Morphine Nausea And Vomiting   Social History   Socioeconomic History  . Marital status: Married    Spouse name: Not on file  . Number of children: Not on file  . Years of education: Not on file  . Highest education level: Not on file  Occupational History  . Not on file  Social Needs  . Financial resource strain: Not on file  . Food insecurity    Worry: Not on file    Inability: Not on file  . Transportation needs    Medical: Not on file    Non-medical: Not on file   Tobacco Use  . Smoking status: Former Research scientist (life sciences)  . Smokeless tobacco: Never Used  Substance and Sexual Activity  . Alcohol use: No    Alcohol/week: 0.0 standard drinks  . Drug use: No  . Sexual activity: Not on file    Comment: married to [place], retired.  Lifestyle  . Physical activity    Days per week: Not on file    Minutes per session: Not on file  . Stress: Not on file  Relationships  . Social Herbalist on phone: Not on file    Gets together: Not on file    Attends religious service: Not on file    Active member of club or organization: Not on file    Attends meetings of clubs or organizations: Not on file    Relationship status: Not on file  . Intimate partner violence    Fear of current or ex partner: Not on file    Emotionally abused: Not on file    Physically abused: Not on file    Forced sexual activity: Not on file  Other Topics Concern  . Not on file  Social History Narrative  . Not on file   Family History  Problem Relation Age of Onset  . Heart attack Mother   . Hypertension Mother       Review of Systems  All other systems reviewed and are negative.      Objective:   Physical Exam  Constitutional: He is oriented to person, place, and time. He appears well-developed and well-nourished. No distress.  Cardiovascular: Normal rate, regular rhythm, normal heart sounds and intact distal pulses. Exam reveals no gallop and no friction rub.  No murmur heard. Pulmonary/Chest: Effort normal and breath sounds normal. No respiratory distress. He has no wheezes. He has no rales. He exhibits no tenderness.  Abdominal: Soft. Bowel sounds are normal.  Musculoskeletal:        General: Tenderness present.  Right knee: He exhibits decreased range of motion. Tenderness found. Medial joint line and lateral joint line tenderness noted.  Neurological: He is alert and oriented to person, place, and time.  Skin: He is not diaphoretic.  Psychiatric: Judgment  normal.  Vitals reviewed.  Slow shuffling gait.  Decreased arm swing.  Antalgic gait due to right knee pain.  Resting tremor that is with activity in both hands.  Patient appears frail and unsteady on his feet       Assessment & Plan:  Type 2 diabetes mellitus without complication, with long-term current use of insulin ([place])  Patient received his flu shot today.  I have recommended increasing his Tyler Aas by 2 units every 2 to 3 days until his fasting blood sugars in the morning are under 150.  If his 2-hour postprandial sugars remain greater than 200 at that point, we may need to consider adding Actos weighing the benefit versus the risk.  Patient son wants to avoid any other injections such as quick acting insulin like NovoLog or medication such as Victoza or Trulicity.  Per the family's wishes I will consult endocrinology for a second opinion.  Meanwhile son will call me in 1 week with an update of his blood sugars.  I anticipate that by the upper 20 to 30 unit range, the patient's fasting blood sugar should fall below 150.  We will monitor for hypoglycemia.

## 2019-04-11 NOTE — Progress Notes (Signed)
Patient ID: [name], male   DOB: [birth year], 85 y.o.   MRN: [id number]  Today's office visit was provided via telemedicine using video technique The patient was explained the limitations of evaluation and management by telemedicine and the availability of in person appointments.  The patient understood the limitations and agreed to proceed. Patient also understood that the telehealth visit is billable. . Location of the patient: Patient's home . Location of the provider: Physician office  the patient's son, the patient and myself were participating in the encounter    Reason for Appointment: Diabetes follow-up   History of Present Illness   Diagnosis: Type 2 DIABETES MELITUS, diagnoses 1972   He has had long-standing diabetes and has been on basal bolus insulin regimen after failure of oral hypoglycemic drugs over the last several years His blood sugars have been generally fairly well controlled but has a tendency to high postprandial readings Previously required only a small dose of 5 units of Levemir insulin  Taking Amaryl in addition to his insulin regimen  his blood sugars had improved his blood sugars during the daytime  He had taken Toujeo since 9/16 instead of twice a day Levemir  RECENT history:   Insulin regimen:  TRESIBA 18 units at breakfast  Oral hypoglycemic drugs: Metformin 1000 mg twice daily, Jardiance 10 mg daily Amaryl 2 mg before breakfast and dinner  A1c was last 8.1  Current management, blood sugar patterns and problems identified  His meter was set to the right date and time on his last visit  He is now getting some assistance from an aide to check his blood sugars but most of the readings are done 20 to 30 minutes after breakfast  He was seen by his PCP on 9/3 and was told to increase his insulin from 18 to 20 units  However given that the son thinks that he is taking 20 units the patient said that he is mostly taking 18 units  He  has only 2 readings before breakfast which are normal and also on the 13th glucose was 96 before dinner  Otherwise blood sugars after meals are over 200  Because of high sugars last month he was told to increase his metformin back to 1 g twice daily and Amaryl to half tablet twice a day for more even control  His weight has leveled off recently  He is getting some protein like eggs in the morning, was told to cut back on carbohydrates in the morning and not have bananas  Still takes Jardiance in the mornings regularly and last renal function was normal    Side effects from medications: None          Proper timing of insulin in relation to meals: Yes.          Monitors blood glucose: <1 times a day.    Glucometer: One Probation officer .          Blood Glucose readings from  meter    PRE-MEAL Fasting Lunch Dinner Bedtime Overall  Glucose range:  99, 104   96, 173    Mean/median:     ?   POST-MEAL PC Breakfast PC Lunch PC Dinner  Glucose range:  178-253   236, 236, 274  Mean/median:         Meals: 3 meals per day. Breakfast at 8 am Lunch 12 noon Supper at 5-6 pm Drinks water mostly and no sweet drinks    Wt Readings from  Last 3 Encounters:  03/31/19 187 lb (84.8 kg)  03/21/19 185 lb 9.6 oz (84.2 kg)  09/28/18 176 lb 4 oz (79.9 kg)   Lab Results  Component Value Date   HGBA1C 8.1 (A) 03/21/2019   HGBA1C 8.2 (A) 09/14/2018   HGBA1C 6.8 (A) 06/09/2018   Lab Results  Component Value Date   MICROALBUR 28.4 03/01/2019   LDLCALC 59 03/01/2019   CREATININE 1.1 03/01/2019     Allergies as of 04/12/2019      Reactions   Avodart [dutasteride] Swelling   Ibuprofen Nausea And Vomiting   Morphine Nausea And Vomiting      Medication List       Accurate as of April 11, 2019  9:01 PM. If you have any questions, ask your nurse or doctor.        atenolol 25 MG tablet Commonly known as: TENORMIN   azelastine 0.1 % nasal spray Commonly known as: ASTELIN Place 1 spray  into both nostrils 2 (two) times daily. Use in each nostril as directed   cholecalciferol 1000 units tablet Commonly known as: VITAMIN D Take 1,000 Units by mouth daily.   clotrimazole-betamethasone cream Commonly known as: LOTRISONE Apply topically daily.   docusate sodium 100 MG capsule Commonly known as: COLACE Take 100 mg by mouth 2 (two) times daily.   donepezil 10 MG tablet Commonly known as: Aricept Take 1 tablet (10 mg total) by mouth at bedtime.   empagliflozin 10 MG Tabs tablet Commonly known as: Jardiance Take 10 mg by mouth daily with breakfast.   ferrous gluconate 325 MG tablet Commonly known as: FERGON Take 325 mg by mouth daily with breakfast.   fluticasone 50 MCG/ACT nasal spray Commonly known as: FLONASE PLACE 2 SPRAYS INTO BOTH NOSTRILS DAILY.   glimepiride 4 MG tablet Commonly known as: AMARYL   glucose blood test strip Commonly known as: OneTouch Verio Use as instructed to check blood sugar twice a day Dx code E11.65   Insulin Pen Needle 31G X 5 MM Misc Commonly known as: B-D UF III MINI PEN NEEDLES USE 4 TIMES PER DAY   B-D UF III MINI PEN NEEDLES 31G X 5 MM Misc Generic drug: Insulin Pen Needle USE 4 TIMES PER DAY   memantine 5 MG tablet Commonly known as: Namenda Take 1 tablet (5 mg total) by mouth 2 (two) times daily.   metFORMIN 1000 MG tablet Commonly known as: GLUCOPHAGE TAKE ONE TABLET IN AM AND 1/2 TABLET AT DINNERTIME   mometasone 0.1 % ointment Commonly known as: Elocon Apply topically daily.   omeprazole 20 MG capsule Commonly known as: PRILOSEC Take 20 mg by mouth daily.   ONE TOUCH ULTRA 2 w/Device Kit Use to test blood sugar daily Dx code E11.65   simvastatin 80 MG tablet Commonly known as: ZOCOR Take 40 mg by mouth at bedtime.   Tresiba 100 UNIT/ML Soln Generic drug: Insulin Degludec Inject 18 Units into the skin daily. Inject 14 units under the skin once daily.       Allergies:  Allergies  Allergen  Reactions  . Avodart [Dutasteride] Swelling  . Ibuprofen Nausea And Vomiting  . Morphine Nausea And Vomiting    Past Medical History:  Diagnosis Date  . B12 deficiency   . BPH (benign prostatic hypertrophy)   . Colon polyps   . Diabetes mellitus   . GERD (gastroesophageal reflux disease)    hiatal hernia  . History of kidney stones   . Hyperlipidemia   .  Hypertension   . Osteoarthritis     Past Surgical History:  Procedure Laterality Date  . APPENDECTOMY    . CARDIOVASCULAR STRESS TEST  09/02/2011   Post stress myocardial perfusion images show normal pattern of perfusion in all areas. No significant wall abnormalites noted.  Marland Kitchen CAROTID DOPPLER  01/27/2011   Right & Left ICAs 0-49% diameter reduction, right & left subclavian arteries demonstrated less than 50% diameter reduction.  . CYSTOSCOPY WITH BIOPSY  06/17/2012   Procedure: CYSTOSCOPY WITH BIOPSY;  Surgeon: Molli Hazard, MD;  Location: [facility];  Service: Urology;  Laterality: N/A;  . KNEE CARTILAGE SURGERY     Arthroscope  . Partial amputaion left foot    . TRANSTHORACIC ECHOCARDIOGRAM  09/02/2011   EF >55%, normal LV systolic function, mild diastolic dysfunction  . TRANSURETHRAL RESECTION OF PROSTATE  06/17/2012   Procedure: TRANSURETHRAL RESECTION OF THE PROSTATE WITH GYRUS INSTRUMENTS;  Surgeon: Molli Hazard, MD;  Location: [facility];  Service: Urology;  Laterality: N/A;    Family History  Problem Relation Age of Onset  . Heart attack Mother   . Hypertension Mother     Social History:  reports that he has quit smoking. He has never used smokeless tobacco. He reports that he does not drink alcohol or use drugs.  Review of Systems:  HYPERTENSION: Controlled, only on low-dose atenolol This has been followed by PCP Also on Jardiance 10 mg  HYPERLIPIDEMIA:   Treated with high-dose simvastatin  with good control as of 9/18  Lab Results  Component Value Date   CHOL 125 09/14/2018   HDL 54.00 09/14/2018    LDLCALC 59 03/01/2019   TRIG 58.0 09/14/2018   CHOLHDL 2 09/14/2018   Last foot exam: 07/2017      Examination:   There were no vitals taken for this visit.  There is no height or weight on file to calculate BMI.      ASSESSMENT/ PLAN:    Diabetes type 2 insulin Dependent  See history of present illness for detailed discussion of his current management, blood sugar patterns and problems identified   1C surprisingly improved at 7.3  His blood sugars are totally out of control with blood sugars averaging 350 This is markedly unusual for him since previously A1c was 7.5 He is also symptomatic with hyperglycemia  Part of his hypoglycemia is related to his difficulty remembering to take his insulin and may be getting to do that at least in the evening and may be in the morning also Also has had more stress with his wife and nursing home probably tending to eat out more in the evening  Clearly he is insulin deficient now Although unlikely that stopping Amaryl would cause hypoglycemia at this stage of his diabetes this may have happened before also on one occasion   Recommendations   Restart Amaryl  Check chemistry panel   Change insulin to TRESIBA since his fasting readings may be the highest of the day and unlikely to be getting any higher postprandial readings  Also will not be able to comply with the evening mixed insulin at suppertime; increasing the dose of premixed insulin may risk hypoglycemia overnight also  Since previously had not been able to manage the V-go pump on his own will not attempt this  His son was shown how to use the titration sheet given to him to adjust the TRESIBA based on fasting blood sugars every 3 to 5 days  We will start with 20  units and stop the 70/30 insulin for now  Continue Amaryl, Jardiance and metformin as long as renal function is normal  His son will call next week to give [place] his blood sugar readings for further adjustment  May  consider consultation with diabetes educator also    There are no Patient Instructions on file for this visit.    Elayne Snare 04/11/2019, 9:01 PM                     Patient ID: [name], male   DOB: 1928-09-04, 85 y.o.   MRN: [id number]   Reason for Appointment: Diabetes follow-up   History of Present Illness   Diagnosis: Type 2 DIABETES MELITUS, diagnoses 1972   He has had long-standing diabetes and has been on basal bolus insulin regimen after failure of oral hypoglycemic drugs over the last several years His blood sugars have been generally fairly well controlled but has a tendency to high postprandial readings Previously required only a small dose of 5 units of Levemir insulin  Taking Amaryl in addition to his insulin regimen  his blood sugars had improved his blood sugars during the daytime  He had taken Toujeo since 9/16 instead of twice a day Levemir  RECENT history:   Insulin regimen:  Novolog mix 70/30: 15 units at breakfast, 8 acs Oral hypoglycemic drugs: Metformin 1500 mg , Jardiance 10 mg daily   A1c is 11.4 compared to 7.5% previously   Current management, blood sugar patterns and problems identified  His blood sugars are markedly increased since his last visit  According to his son he is probably not taking his evening insulin consistently or sometimes after supper at night because he goes to the nursing home to visit his wife late afternoon  He did not take his insulin last night and today his blood sugar was 541  He has lost weight and also appears to be much more thirsty and getting more fatigued  His son thinks that he is also more stressed  AMARYL was stopped on his last visit while there medications were continued  He is checking only FASTING blood sugars and forgets to do readings later in the day  He has not changed his diet and also on his fluid intake he is still continuing to use diet drinks or water    Side effects from  medications: None          Proper timing of insulin in relation to meals: Yes.          Monitors blood glucose: <1 times a day.    Glucometer: One Touch ultra 2 .          Blood Glucose readings   .Mean values apply above for all meters except median for One Touch  PRE-MEAL Fasting Lunch Dinner Bedtime Overall  Glucose range:  157-541   ?   Mean/median:  349        PREVIOUS MEDIAN 161   Meals: 3 meals per day. Breakfast at 8 am Lunch 12 noon Supper at 5-6 pm; breakfast is usually oatmeal with eggs.  Drinks water mostly and no sweet drinks            Physical activity: exercise: occasionally, bike      Wt Readings from Last 3 Encounters:  03/31/19 187 lb (84.8 kg)  03/21/19 185 lb 9.6 oz (84.2 kg)  09/28/18 176 lb 4 oz (79.9 kg)   Lab Results  Component Value Date  HGBA1C 8.1 (A) 03/21/2019   HGBA1C 8.2 (A) 09/14/2018   HGBA1C 6.8 (A) 06/09/2018   Lab Results  Component Value Date   MICROALBUR 28.4 03/01/2019   LDLCALC 59 03/01/2019   CREATININE 1.1 03/01/2019     Allergies as of 04/12/2019      Reactions   Avodart [dutasteride] Swelling   Ibuprofen Nausea And Vomiting   Morphine Nausea And Vomiting      Medication List       Accurate as of April 11, 2019  9:01 PM. If you have any questions, ask your nurse or doctor.        atenolol 25 MG tablet Commonly known as: TENORMIN   azelastine 0.1 % nasal spray Commonly known as: ASTELIN Place 1 spray into both nostrils 2 (two) times daily. Use in each nostril as directed   cholecalciferol 1000 units tablet Commonly known as: VITAMIN D Take 1,000 Units by mouth daily.   clotrimazole-betamethasone cream Commonly known as: LOTRISONE Apply topically daily.   docusate sodium 100 MG capsule Commonly known as: COLACE Take 100 mg by mouth 2 (two) times daily.   donepezil 10 MG tablet Commonly known as: Aricept Take 1 tablet (10 mg total) by mouth at bedtime.   empagliflozin 10 MG Tabs tablet  Commonly known as: Jardiance Take 10 mg by mouth daily with breakfast.   ferrous gluconate 325 MG tablet Commonly known as: FERGON Take 325 mg by mouth daily with breakfast.   fluticasone 50 MCG/ACT nasal spray Commonly known as: FLONASE PLACE 2 SPRAYS INTO BOTH NOSTRILS DAILY.   glimepiride 4 MG tablet Commonly known as: AMARYL   glucose blood test strip Commonly known as: OneTouch Verio Use as instructed to check blood sugar twice a day Dx code E11.65   Insulin Pen Needle 31G X 5 MM Misc Commonly known as: B-D UF III MINI PEN NEEDLES USE 4 TIMES PER DAY   B-D UF III MINI PEN NEEDLES 31G X 5 MM Misc Generic drug: Insulin Pen Needle USE 4 TIMES PER DAY   memantine 5 MG tablet Commonly known as: Namenda Take 1 tablet (5 mg total) by mouth 2 (two) times daily.   metFORMIN 1000 MG tablet Commonly known as: GLUCOPHAGE TAKE ONE TABLET IN AM AND 1/2 TABLET AT DINNERTIME   mometasone 0.1 % ointment Commonly known as: Elocon Apply topically daily.   omeprazole 20 MG capsule Commonly known as: PRILOSEC Take 20 mg by mouth daily.   ONE TOUCH ULTRA 2 w/Device Kit Use to test blood sugar daily Dx code E11.65   simvastatin 80 MG tablet Commonly known as: ZOCOR Take 40 mg by mouth at bedtime.   Tresiba 100 UNIT/ML Soln Generic drug: Insulin Degludec Inject 18 Units into the skin daily. Inject 14 units under the skin once daily.       Allergies:  Allergies  Allergen Reactions  . Avodart [Dutasteride] Swelling  . Ibuprofen Nausea And Vomiting  . Morphine Nausea And Vomiting    Past Medical History:  Diagnosis Date  . B12 deficiency   . BPH (benign prostatic hypertrophy)   . Colon polyps   . Diabetes mellitus   . GERD (gastroesophageal reflux disease)    hiatal hernia  . History of kidney stones   . Hyperlipidemia   . Hypertension   . Osteoarthritis     Past Surgical History:  Procedure Laterality Date  . APPENDECTOMY    . CARDIOVASCULAR STRESS TEST   09/02/2011   Post stress myocardial perfusion images  show normal pattern of perfusion in all areas. No significant wall abnormalites noted.  Marland Kitchen CAROTID DOPPLER  01/27/2011   Right & Left ICAs 0-49% diameter reduction, right & left subclavian arteries demonstrated less than 50% diameter reduction.  . CYSTOSCOPY WITH BIOPSY  06/17/2012   Procedure: CYSTOSCOPY WITH BIOPSY;  Surgeon: Molli Hazard, MD;  Location: [facility];  Service: Urology;  Laterality: N/A;  . KNEE CARTILAGE SURGERY     Arthroscope  . Partial amputaion left foot    . TRANSTHORACIC ECHOCARDIOGRAM  09/02/2011   EF >55%, normal LV systolic function, mild diastolic dysfunction  . TRANSURETHRAL RESECTION OF PROSTATE  06/17/2012   Procedure: TRANSURETHRAL RESECTION OF THE PROSTATE WITH GYRUS INSTRUMENTS;  Surgeon: Molli Hazard, MD;  Location: [facility];  Service: Urology;  Laterality: N/A;    Family History  Problem Relation Age of Onset  . Heart attack Mother   . Hypertension Mother     Social History:  reports that he has quit smoking. He has never used smokeless tobacco. He reports that he does not drink alcohol or use drugs.  Review of Systems:  HYPERTENSION:   followed by PCP Blood pressure was low in August and atenolol has been stopped as per PCP note  Also on Jardiance 10 mg Has been seen by his PCP recently  BP Readings from Last 3 Encounters:  03/31/19 120/68  03/21/19 (!) 108/58  09/28/18 112/74     HYPERLIPIDEMIA:   Treated with 80 mg simvastatin by his PCP with good control Recent [facility] available  Lab Results  Component Value Date   CHOL 125 09/14/2018   HDL 54.00 09/14/2018   LDLCALC 59 03/01/2019   TRIG 58.0 09/14/2018   CHOLHDL 2 09/14/2018   Last foot exam: 09/2018, upcoming foot exam in 9/20 with podiatrist  Last eye exam 7/19   Examination:   There were no vitals taken for this visit.  There is no height or weight on file to calculate BMI.     ASSESSMENT/ PLAN:    Diabetes type 2 insulin requiring  See history of present illness for detailed discussion of his current management, blood sugar patterns and problems identified  His A1c is last 8.1  Blood sugars are improving as discussed above He does have readings over 200 after meals but most of his readings are checked within 30 minutes of the meal  Fasting blood sugars are near 100 which may be too low for him and potentially could experience hypoglycemia also overnight  Hypertension: Not on treatment now as per PCP note and blood pressure is fairly good when examined by PCP   Recommendations  Tyler Aas will be reduced by 2 units down to 16 units since he is still taking 18 Discussed that his fasting readings are too tightly controlled and may potentially get low overnight For his age and concomitant medical problems his A1c of around 8% is adequate Would be too complicated to have him start mealtime insulin and blood sugars of low 200s+ postprandially are acceptable  Will have him come back in follow-up in another 6 weeks or so  There are no Patient Instructions on file for this visit.    Elayne Snare 04/11/2019, 9:01 PM

## 2019-04-12 ENCOUNTER — Encounter: Payer: Self-pay | Admitting: Endocrinology

## 2019-04-12 ENCOUNTER — Other Ambulatory Visit: Payer: Self-pay

## 2019-04-12 ENCOUNTER — Ambulatory Visit (INDEPENDENT_AMBULATORY_CARE_PROVIDER_SITE_OTHER): Payer: Medicare Other | Admitting: Endocrinology

## 2019-04-12 DIAGNOSIS — E1165 Type 2 diabetes mellitus with hyperglycemia: Secondary | ICD-10-CM

## 2019-04-12 DIAGNOSIS — Z794 Long term (current) use of insulin: Secondary | ICD-10-CM

## 2019-04-14 ENCOUNTER — Other Ambulatory Visit: Payer: Self-pay

## 2019-04-14 MED ORDER — ONETOUCH VERIO VI STRP: ORAL_STRIP | 12 refills | Status: DC

## 2019-04-15 ENCOUNTER — Other Ambulatory Visit: Payer: Self-pay

## 2019-04-15 MED ORDER — ONETOUCH VERIO VI STRP: ORAL_STRIP | 12 refills | Status: DC

## 2019-04-18 ENCOUNTER — Encounter: Payer: Self-pay | Admitting: Podiatry

## 2019-04-18 ENCOUNTER — Other Ambulatory Visit: Payer: Self-pay

## 2019-04-18 ENCOUNTER — Ambulatory Visit (INDEPENDENT_AMBULATORY_CARE_PROVIDER_SITE_OTHER): Payer: Medicare Other | Admitting: Podiatry

## 2019-04-18 DIAGNOSIS — M79675 Pain in left toe(s): Secondary | ICD-10-CM

## 2019-04-18 DIAGNOSIS — B351 Tinea unguium: Secondary | ICD-10-CM

## 2019-04-18 DIAGNOSIS — E119 Type 2 diabetes mellitus without complications: Secondary | ICD-10-CM

## 2019-04-18 DIAGNOSIS — B353 Tinea pedis: Secondary | ICD-10-CM | POA: Diagnosis not present

## 2019-04-18 DIAGNOSIS — M79674 Pain in right toe(s): Secondary | ICD-10-CM | POA: Diagnosis not present

## 2019-04-18 LAB — HM DIABETES FOOT EXAM: HM Diabetic Foot Exam: NORMAL

## 2019-04-18 MED ORDER — CLOTRIMAZOLE-BETAMETHASONE 1-0.05 % EX CREA: TOPICAL_CREAM | Freq: Every day | CUTANEOUS | 1 refills | Status: DC

## 2019-04-18 NOTE — Progress Notes (Signed)
Patient ID: CAILAN STUDENT, male   DOB: 1929/07/10, 85 y.o.   MRN: [id number] Complaint:  Visit Type: Patient returns to my office for continued preventative foot care services. Complaint: Patient states" my nails have grown long and thick and become painful to walk and wear shoes" Patient has been diagnosed with DM with no foot complications. The patient presents for preventative foot care services. No changes to ROS.  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  Podiatric Exam: Vascular: dorsalis pedis and posterior tibial pulses are palpable bilateral. Capillary return is immediate. Temperature gradient is WNL. Skin turgor WNL  Sensorium: Normal Semmes Weinstein monofilament test. Normal tactile sensation bilaterally. Nail Exam: Pt has thick disfigured discolored nails with subungual debris noted bilateral entire nail hallux through fifth toenails Ulcer Exam: There is no evidence of ulcer or pre-ulcerative changes or infection. Orthopedic Exam: Muscle tone and strength are WNL. No limitations in general ROM. No crepitus or effusions noted. Foot type and digits show no abnormalities. Bony prominences are unremarkable. Skin:  Porokeratosis   plantar aspect left hallux. Asymptomatic. Marland Kitchen No infection or ulcers.  Red inflamed peeling left forefoot plantarly.  Diagnosis:  Onychomycosis, , Pain in right toe, pain in left toes,  Tinea pedis left foot.  Treatment & Plan Procedures and Treatment: Consent by patient was obtained for treatment procedures. The patient understood the discussion of treatment and procedures well. All questions were answered thoroughly reviewed. Debridement of mycotic and hypertrophic toenails, 1 through 5 bilateral and clearing of subungual debris. No ulceration, no infection noted. Discussed his skin condition and prescribed lotrisone. Return Visit-Office Procedure: Patient instructed to return to the office for a follow up visit 3 months for continued  evaluation and treatment.    Gardiner Barefoot DPM

## 2019-04-25 ENCOUNTER — Ambulatory Visit: Payer: Medicare Other | Admitting: Podiatry

## 2019-05-23 ENCOUNTER — Other Ambulatory Visit: Payer: Self-pay

## 2019-05-23 MED ORDER — BD PEN NEEDLE MINI U/F 31G X 5 MM MISC: 3 refills | Status: DC

## 2019-06-12 ENCOUNTER — Other Ambulatory Visit: Payer: Self-pay | Admitting: Endocrinology

## 2019-06-13 ENCOUNTER — Other Ambulatory Visit: Payer: Self-pay

## 2019-06-13 MED ORDER — GLIMEPIRIDE 2 MG PO TABS: ORAL_TABLET | ORAL | 2 refills | Status: DC

## 2019-06-20 ENCOUNTER — Encounter: Payer: Self-pay | Admitting: Endocrinology

## 2019-06-20 ENCOUNTER — Ambulatory Visit (INDEPENDENT_AMBULATORY_CARE_PROVIDER_SITE_OTHER): Payer: Medicare Other | Admitting: Endocrinology

## 2019-06-20 ENCOUNTER — Other Ambulatory Visit: Payer: Self-pay

## 2019-06-20 VITALS — BP 122/56 | HR 86 | Ht 69.0 in | Wt 191.2 lb

## 2019-06-20 DIAGNOSIS — E1165 Type 2 diabetes mellitus with hyperglycemia: Secondary | ICD-10-CM | POA: Diagnosis not present

## 2019-06-20 DIAGNOSIS — Z794 Long term (current) use of insulin: Secondary | ICD-10-CM | POA: Diagnosis not present

## 2019-06-20 DIAGNOSIS — E7800 Pure hypercholesterolemia, unspecified: Secondary | ICD-10-CM

## 2019-06-20 LAB — COMPREHENSIVE METABOLIC PANEL
ALT: 15 U/L (ref 0–53)
AST: 16 U/L (ref 0–37)
Albumin: 3.8 g/dL (ref 3.5–5.2)
Alkaline Phosphatase: 108 U/L (ref 39–117)
BUN: 28 mg/dL — ABNORMAL HIGH (ref 6–23)
CO2: 29 mEq/L (ref 19–32)
Calcium: 9.4 mg/dL (ref 8.4–10.5)
Chloride: 105 mEq/L (ref 96–112)
Creatinine, Ser: 1.22 mg/dL (ref 0.40–1.50)
GFR: 56.04 mL/min — ABNORMAL LOW (ref >60.00)
Glucose, Bld: 189 mg/dL — ABNORMAL HIGH (ref 70–99)
Potassium: 4.5 mEq/L (ref 3.5–5.1)
Sodium: 141 mEq/L (ref 135–145)
Total Bilirubin: 0.7 mg/dL (ref 0.2–1.2)
Total Protein: 6.9 g/dL (ref 6.0–8.3)

## 2019-06-20 LAB — POCT GLYCOSYLATED HEMOGLOBIN (HGB A1C): Hemoglobin A1C: 7.6 % — AB (ref 4.0–5.6)

## 2019-06-20 LAB — LDL CHOLESTEROL, DIRECT: Direct LDL: 89 mg/dL

## 2019-06-20 NOTE — Progress Notes (Signed)
Patient ID: [name], male   DOB: Dec 22, 1928, 85 y.o.   MRN: [id number]   Reason for Appointment: Diabetes follow-up   History of Present Illness   Diagnosis: Type 2 DIABETES MELITUS, diagnoses 1972   He has had long-standing diabetes and has been on basal bolus insulin regimen after failure of oral hypoglycemic drugs over the last several years His blood sugars have been generally fairly well controlled but has a tendency to high postprandial readings Previously required only a small dose of 5 units of Levemir insulin  Taking Amaryl in addition to his insulin regimen  his blood sugars had improved his blood sugars during the daytime  He has taken Toujeo since 9/16 instead of twice a day Levemir  RECENT history:   Insulin regimen:  TRESIBA 16 units at breakfast  Oral hypoglycemic drugs: Metformin 1000 mg a.m. and 500 mg p.m., Jardiance 10 mg daily Amaryl 2 mg in a.m. and 1 mg p.m.   Current management, blood sugar patterns and problems identified  His A1c is relatively better at 7.6 compared to 8.1   He is getting assistance from an aide to check his blood sugars but some of the readings are done 20 to 30 minutes after breakfast  His blood sugars appear to be fluctuating and even before breakfast may have some high readings  However has had fasting readings as low as 86 over the last month  No hypoglycemia however  Usually not checking readings later in the day  Most of his blood sugars after breakfast are over 200  Taking his oral medications consistently as prescribed  His weight has gone up slightly  He is getting some protein like eggs and or peanut butter in the morning but also is eating crackers, oatmeal and small amount of juice, previously also eating a banana  Usually no change in renal function with using Jardiance  Although he may do a little yard work he is not doing much physical activity or exercise    Side effects from medications:  None          Proper timing of insulin in relation to meals: Yes.          Monitors blood glucose: <1 times a day.    Glucometer: One Probation officer .          Blood Glucose readings from  meter    PRE-MEAL Fasting Lunch  6 PM Bedtime Overall  Glucose range:  86-252   158, 180, 216    Mean/median:  165     174   POST-MEAL PC Breakfast PC Lunch PC Dinner  Glucose range:  166-313    Mean/median:      PREVIOUS readings:  PRE-MEAL Fasting Lunch Dinner Bedtime Overall  Glucose range:  99, 104   96, 173    Mean/median:     ?   POST-MEAL PC Breakfast PC Lunch PC Dinner  Glucose range:  178-253   236, 236, 274  Mean/median:         Meals: 3 meals per day. Breakfast at 8 am Lunch 12 noon Supper at 5-6 pm Drinks water mostly and no sweet drinks    Wt Readings from Last 3 Encounters:  06/20/19 191 lb 3.2 oz (86.7 kg)  03/31/19 187 lb (84.8 kg)  03/21/19 185 lb 9.6 oz (84.2 kg)   Lab Results  Component Value Date   HGBA1C 7.6 (A) 06/20/2019   HGBA1C 8.1 (A) 03/21/2019   HGBA1C 8.2 (  A) 09/14/2018   Lab Results  Component Value Date   MICROALBUR 28.4 03/01/2019   LDLCALC 59 03/01/2019   CREATININE 1.1 03/01/2019     Allergies as of 06/20/2019      Reactions   Avodart [dutasteride] Swelling   Ibuprofen Nausea And Vomiting   Morphine Nausea And Vomiting      Medication List       Accurate as of June 20, 2019 10:16 AM. If you have any questions, ask your nurse or doctor.        azelastine 0.1 % nasal spray Commonly known as: ASTELIN Place 1 spray into both nostrils 2 (two) times daily. Use in each nostril as directed   B-D UF III MINI PEN NEEDLES 31G X 5 MM Misc Generic drug: Insulin Pen Needle USE 4 TIMES PER DAY   B-D UF III MINI PEN NEEDLES 31G X 5 MM Misc Generic drug: Insulin Pen Needle USE 4 TIMES PER DAY   cholecalciferol 1000 units tablet Commonly known as: VITAMIN D Take 1,000 Units by mouth daily.   clotrimazole-betamethasone cream  Commonly known as: LOTRISONE Apply topically daily.   docusate sodium 100 MG capsule Commonly known as: COLACE Take 100 mg by mouth 2 (two) times daily.   donepezil 10 MG tablet Commonly known as: Aricept Take 1 tablet (10 mg total) by mouth at bedtime.   empagliflozin 10 MG Tabs tablet Commonly known as: Jardiance Take 10 mg by mouth daily with breakfast.   ferrous gluconate 325 MG tablet Commonly known as: FERGON Take 325 mg by mouth daily with breakfast.   fluticasone 50 MCG/ACT nasal spray Commonly known as: FLONASE PLACE 2 SPRAYS INTO BOTH NOSTRILS DAILY.   glimepiride 2 MG tablet Commonly known as: AMARYL Take 2 mg by mouth See admin instructions. Take 1 tablet by mouth daily at breakfast and 1/2 tablet at supper. What changed: Another medication with the same name was removed. Continue taking this medication, and follow the directions you see here. Changed by: Elayne Snare, MD   memantine 5 MG tablet Commonly known as: Namenda Take 1 tablet (5 mg total) by mouth 2 (two) times daily.   metFORMIN 1000 MG tablet Commonly known as: GLUCOPHAGE Take 1,000 mg by mouth See admin instructions. Take 1 tablet by mouth daily at breakfast and 1/2 tablet at supper. What changed: Another medication with the same name was removed. Continue taking this medication, and follow the directions you see here. Changed by: Elayne Snare, MD   mometasone 0.1 % ointment Commonly known as: Elocon Apply topically daily.   omeprazole 20 MG capsule Commonly known as: PRILOSEC Take 20 mg by mouth daily.   ONE TOUCH ULTRA 2 w/Device Kit Use to test blood sugar daily Dx code E11.65   OneTouch Verio test strip Generic drug: glucose blood Use as instructed to check blood sugar three times a day. Dx code E11.65   simvastatin 80 MG tablet Commonly known as: ZOCOR Take 40 mg by mouth at bedtime.   Tyler Aas FlexTouch 100 UNIT/ML Sopn FlexTouch Pen Generic drug: insulin degludec Inject 16 Units  into the skin daily. What changed: Another medication with the same name was removed. Continue taking this medication, and follow the directions you see here. Changed by: Elayne Snare, MD       Allergies:  Allergies  Allergen Reactions  . Avodart [Dutasteride] Swelling  . Ibuprofen Nausea And Vomiting  . Morphine Nausea And Vomiting    Past Medical History:  Diagnosis Date  .  B12 deficiency   . BPH (benign prostatic hypertrophy)   . Colon polyps   . Diabetes mellitus   . GERD (gastroesophageal reflux disease)    hiatal hernia  . History of kidney stones   . Hyperlipidemia   . Hypertension   . Osteoarthritis     Past Surgical History:  Procedure Laterality Date  . APPENDECTOMY    . CARDIOVASCULAR STRESS TEST  09/02/2011   Post stress myocardial perfusion images show normal pattern of perfusion in all areas. No significant wall abnormalites noted.  Marland Kitchen CAROTID DOPPLER  01/27/2011   Right & Left ICAs 0-49% diameter reduction, right & left subclavian arteries demonstrated less than 50% diameter reduction.  . CYSTOSCOPY WITH BIOPSY  06/17/2012   Procedure: CYSTOSCOPY WITH BIOPSY;  Surgeon: Molli Hazard, MD;  Location: [facility];  Service: Urology;  Laterality: N/A;  . KNEE CARTILAGE SURGERY     Arthroscope  . Partial amputaion left foot    . TRANSTHORACIC ECHOCARDIOGRAM  09/02/2011   EF >55%, normal LV systolic function, mild diastolic dysfunction  . TRANSURETHRAL RESECTION OF PROSTATE  06/17/2012   Procedure: TRANSURETHRAL RESECTION OF THE PROSTATE WITH GYRUS INSTRUMENTS;  Surgeon: Molli Hazard, MD;  Location: [facility];  Service: Urology;  Laterality: N/A;    Family History  Problem Relation Age of Onset  . Heart attack Mother   . Hypertension Mother     Social History:  reports that he has quit smoking. He has never used smokeless tobacco. He reports that he does not drink alcohol or use drugs.  Review of Systems:  HYPERTENSION: Controlled, only on low-dose  atenolol This has been followed by PCP Also on Jardiance 10 mg  HYPERLIPIDEMIA:   Treated with high-dose simvastatin  with good control as of 9/18  Lab Results  Component Value Date   CHOL 125 09/14/2018   HDL 54.00 09/14/2018   LDLCALC 59 03/01/2019   TRIG 58.0 09/14/2018   CHOLHDL 2 09/14/2018   Last foot exam: 07/2017      Examination:   BP (!) 122/56 (BP Location: Left Arm, Patient Position: Sitting, Cuff Size: Normal)   Pulse 86   Ht _0  (1.753 m)   Wt 191 lb 3.2 oz (86.7 kg)   SpO2 92%   BMI 28.24 kg/m   Body mass index is 28.24 kg/m.      ASSESSMENT/ PLAN:    Diabetes type 2 insulin Dependent  See history of present illness for detailed discussion of his current management, blood sugar patterns and problems identified   1C surprisingly improved at 7.3  His blood sugars are totally out of control with blood sugars averaging 350 This is markedly unusual for him since previously A1c was 7.5 He is also symptomatic with hyperglycemia  Part of his hypoglycemia is related to his difficulty remembering to take his insulin and may be getting to do that at least in the evening and may be in the morning also Also has had more stress with his wife and nursing home probably tending to eat out more in the evening  Clearly he is insulin deficient now Although unlikely that stopping Amaryl would cause hypoglycemia at this stage of his diabetes this may have happened before also on one occasion   Recommendations   Restart Amaryl  Check chemistry panel   Change insulin to TRESIBA since his fasting readings may be the highest of the day and unlikely to be getting any higher postprandial readings  Also will not be  able to comply with the evening mixed insulin at suppertime; increasing the dose of premixed insulin may risk hypoglycemia overnight also  Since previously had not been able to manage the V-go pump on his own will not attempt this  His son was shown how to use  the titration sheet given to him to adjust the [facility] based on fasting blood sugars every 3 to 5 days  We will start with 20 units and stop the 70/30 insulin for now  Continue Amaryl, Jardiance and metformin as long as renal function is normal  His son will call next week to give [place] his blood sugar readings for further adjustment  May consider consultation with diabetes educator also    There are no Patient Instructions on file for this visit.    Elayne Snare 06/20/2019, 10:16 AM                     Patient ID: [name], male   DOB: Apr 24, 1929, 85 y.o.   MRN: [id number]   Reason for Appointment: Diabetes follow-up   History of Present Illness   Diagnosis: Type 2 DIABETES MELITUS, diagnoses 1972   He has had long-standing diabetes and has been on basal bolus insulin regimen after failure of oral hypoglycemic drugs over the last several years His blood sugars have been generally fairly well controlled but has a tendency to high postprandial readings Previously required only a small dose of 5 units of Levemir insulin  Taking Amaryl in addition to his insulin regimen  his blood sugars had improved his blood sugars during the daytime  He had taken Toujeo since 9/16 instead of twice a day Levemir  RECENT history:   Insulin regimen:  Novolog mix 70/30: 15 units at breakfast, 8 acs Oral hypoglycemic drugs: Metformin 1500 mg , Jardiance 10 mg daily   A1c is 11.4 compared to 7.5% previously   Current management, blood sugar patterns and problems identified  His blood sugars are markedly increased since his last visit  According to his son he is probably not taking his evening insulin consistently or sometimes after supper at night because he goes to the nursing home to visit his wife late afternoon  He did not take his insulin last night and today his blood sugar was 541  He has lost weight and also appears to be much more thirsty and getting more fatigued   His son thinks that he is also more stressed  AMARYL was stopped on his last visit while there medications were continued  He is checking only FASTING blood sugars and forgets to do readings later in the day  He has not changed his diet and also on his fluid intake he is still continuing to use diet drinks or water    Side effects from medications: None          Proper timing of insulin in relation to meals: Yes.          Monitors blood glucose: <1 times a day.    Glucometer: One Touch ultra 2 .          Blood Glucose readings   .Mean values apply above for all meters except median for One Touch  PRE-MEAL Fasting Lunch Dinner Bedtime Overall  Glucose range:  157-541   ?   Mean/median:  349        PREVIOUS MEDIAN 161   Meals: 3 meals per day. Breakfast at 8 am Lunch 12 noon Supper at  5-6 pm; breakfast is usually oatmeal with eggs.  Drinks water mostly and no sweet drinks            Physical activity: exercise: occasionally, bike      Wt Readings from Last 3 Encounters:  06/20/19 191 lb 3.2 oz (86.7 kg)  03/31/19 187 lb (84.8 kg)  03/21/19 185 lb 9.6 oz (84.2 kg)   Lab Results  Component Value Date   HGBA1C 7.6 (A) 06/20/2019   HGBA1C 8.1 (A) 03/21/2019   HGBA1C 8.2 (A) 09/14/2018   Lab Results  Component Value Date   MICROALBUR 28.4 03/01/2019   LDLCALC 59 03/01/2019   CREATININE 1.1 03/01/2019     Allergies as of 06/20/2019      Reactions   Avodart [dutasteride] Swelling   Ibuprofen Nausea And Vomiting   Morphine Nausea And Vomiting      Medication List       Accurate as of June 20, 2019 10:16 AM. If you have any questions, ask your nurse or doctor.        azelastine 0.1 % nasal spray Commonly known as: ASTELIN Place 1 spray into both nostrils 2 (two) times daily. Use in each nostril as directed   B-D UF III MINI PEN NEEDLES 31G X 5 MM Misc Generic drug: Insulin Pen Needle USE 4 TIMES PER DAY   B-D UF III MINI PEN NEEDLES 31G X 5 MM Misc  Generic drug: Insulin Pen Needle USE 4 TIMES PER DAY   cholecalciferol 1000 units tablet Commonly known as: VITAMIN D Take 1,000 Units by mouth daily.   clotrimazole-betamethasone cream Commonly known as: LOTRISONE Apply topically daily.   docusate sodium 100 MG capsule Commonly known as: COLACE Take 100 mg by mouth 2 (two) times daily.   donepezil 10 MG tablet Commonly known as: Aricept Take 1 tablet (10 mg total) by mouth at bedtime.   empagliflozin 10 MG Tabs tablet Commonly known as: Jardiance Take 10 mg by mouth daily with breakfast.   ferrous gluconate 325 MG tablet Commonly known as: FERGON Take 325 mg by mouth daily with breakfast.   fluticasone 50 MCG/ACT nasal spray Commonly known as: FLONASE PLACE 2 SPRAYS INTO BOTH NOSTRILS DAILY.   glimepiride 2 MG tablet Commonly known as: AMARYL Take 2 mg by mouth See admin instructions. Take 1 tablet by mouth daily at breakfast and 1/2 tablet at supper. What changed: Another medication with the same name was removed. Continue taking this medication, and follow the directions you see here. Changed by: Elayne Snare, MD   memantine 5 MG tablet Commonly known as: Namenda Take 1 tablet (5 mg total) by mouth 2 (two) times daily.   metFORMIN 1000 MG tablet Commonly known as: GLUCOPHAGE Take 1,000 mg by mouth See admin instructions. Take 1 tablet by mouth daily at breakfast and 1/2 tablet at supper. What changed: Another medication with the same name was removed. Continue taking this medication, and follow the directions you see here. Changed by: Elayne Snare, MD   mometasone 0.1 % ointment Commonly known as: Elocon Apply topically daily.   omeprazole 20 MG capsule Commonly known as: PRILOSEC Take 20 mg by mouth daily.   ONE TOUCH ULTRA 2 w/Device Kit Use to test blood sugar daily Dx code E11.65   OneTouch Verio test strip Generic drug: glucose blood Use as instructed to check blood sugar three times a day. Dx code  E11.65   simvastatin 80 MG tablet Commonly known as: ZOCOR Take 40 mg by  mouth at bedtime.   Tyler Aas FlexTouch 100 UNIT/ML Sopn FlexTouch Pen Generic drug: insulin degludec Inject 16 Units into the skin daily. What changed: Another medication with the same name was removed. Continue taking this medication, and follow the directions you see here. Changed by: Elayne Snare, MD       Allergies:  Allergies  Allergen Reactions  . Avodart [Dutasteride] Swelling  . Ibuprofen Nausea And Vomiting  . Morphine Nausea And Vomiting    Past Medical History:  Diagnosis Date  . B12 deficiency   . BPH (benign prostatic hypertrophy)   . Colon polyps   . Diabetes mellitus   . GERD (gastroesophageal reflux disease)    hiatal hernia  . History of kidney stones   . Hyperlipidemia   . Hypertension   . Osteoarthritis     Past Surgical History:  Procedure Laterality Date  . APPENDECTOMY    . CARDIOVASCULAR STRESS TEST  09/02/2011   Post stress myocardial perfusion images show normal pattern of perfusion in all areas. No significant wall abnormalites noted.  Marland Kitchen CAROTID DOPPLER  01/27/2011   Right & Left ICAs 0-49% diameter reduction, right & left subclavian arteries demonstrated less than 50% diameter reduction.  . CYSTOSCOPY WITH BIOPSY  06/17/2012   Procedure: CYSTOSCOPY WITH BIOPSY;  Surgeon: Molli Hazard, MD;  Location: [facility];  Service: Urology;  Laterality: N/A;  . KNEE CARTILAGE SURGERY     Arthroscope  . Partial amputaion left foot    . TRANSTHORACIC ECHOCARDIOGRAM  09/02/2011   EF >55%, normal LV systolic function, mild diastolic dysfunction  . TRANSURETHRAL RESECTION OF PROSTATE  06/17/2012   Procedure: TRANSURETHRAL RESECTION OF THE PROSTATE WITH GYRUS INSTRUMENTS;  Surgeon: Molli Hazard, MD;  Location: [facility];  Service: Urology;  Laterality: N/A;    Family History  Problem Relation Age of Onset  . Heart attack Mother   . Hypertension Mother     Social History:   reports that he has quit smoking. He has never used smokeless tobacco. He reports that he does not drink alcohol or use drugs.  Review of Systems:  HYPERTENSION:   followed by PCP Blood pressure was low in August and atenolol has been stopped  Also on Jardiance 10 mg Has been seen by his PCP periodically  BP Readings from Last 3 Encounters:  06/20/19 (!) 122/56  03/31/19 120/68  03/21/19 (!) 108/58     HYPERLIPIDEMIA:   Treated with 80 mg simvastatin by his PCP with good control [facility] have been obtained  Lab Results  Component Value Date   CHOL 125 09/14/2018   HDL 54.00 09/14/2018   LDLCALC 59 03/01/2019   TRIG 58.0 09/14/2018   CHOLHDL 2 09/14/2018   Last foot exam:  foot exam in 9/20 with podiatrist with the following findings:  Vascular: dorsalis pedis and posterior tibial pulses are palpable bilateral. Capillary return is immediate. Temperature gradient is WNL. Skin turgor WNL  Sensorium: Normal Semmes Weinstein monofilament test. Normal tactile sensation bilaterally.  Last eye exam 7/19   Examination:   BP (!) 122/56 (BP Location: Left Arm, Patient Position: Sitting, Cuff Size: Normal)   Pulse 86   Ht _0  (1.753 m)   Wt 191 lb 3.2 oz (86.7 kg)   SpO2 92%   BMI 28.24 kg/m   Body mass index is 28.24 kg/m.    ASSESSMENT/ PLAN:    Diabetes type 2 insulin requiring  See history of present illness for detailed discussion of  his current management, blood sugar patterns and problems identified  His A1c is slightly better at 7.6  Blood sugars are improving as discussed above He does have readings over 200 after meals but most of his readings are checked within 30 minutes of the meal  Fasting blood sugars are near 100 which may be too low for him and potentially could experience hypoglycemia also overnight   Recommendations  No change in insulin Increase exercise and he can try using his stationary bike daily This may help his  readings during the day Consider increasing Jardiance if his A1c goes up Check LDL level  Follow-up in 3 months   There are no Patient Instructions on file for this visit.    Elayne Snare 06/20/2019, 10:16 AM

## 2019-06-29 ENCOUNTER — Other Ambulatory Visit: Payer: Self-pay | Admitting: Endocrinology

## 2019-07-25 ENCOUNTER — Encounter: Payer: Self-pay | Admitting: Podiatry

## 2019-07-25 ENCOUNTER — Ambulatory Visit (INDEPENDENT_AMBULATORY_CARE_PROVIDER_SITE_OTHER): Payer: Medicare Other | Admitting: Podiatry

## 2019-07-25 ENCOUNTER — Other Ambulatory Visit: Payer: Self-pay

## 2019-07-25 DIAGNOSIS — L989 Disorder of the skin and subcutaneous tissue, unspecified: Secondary | ICD-10-CM | POA: Diagnosis not present

## 2019-07-25 DIAGNOSIS — B351 Tinea unguium: Secondary | ICD-10-CM

## 2019-07-25 DIAGNOSIS — M79674 Pain in right toe(s): Secondary | ICD-10-CM | POA: Diagnosis not present

## 2019-07-25 DIAGNOSIS — M79675 Pain in left toe(s): Secondary | ICD-10-CM | POA: Diagnosis not present

## 2019-07-25 DIAGNOSIS — E119 Type 2 diabetes mellitus without complications: Secondary | ICD-10-CM | POA: Diagnosis not present

## 2019-07-25 NOTE — Progress Notes (Signed)
Patient ID: [name], male   DOB: [birth year], 85 y.o.   MRN: [id number] Complaint:  Visit Type: Patient returns to my office for continued preventative foot care services. Complaint: Patient states" my nails have grown long and thick and become painful to walk and wear shoes" Patient has been diagnosed with DM with no foot complications. The patient presents for preventative foot care services. No changes to ROS.  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  Podiatric Exam: Vascular: dorsalis pedis and posterior tibial pulses are palpable bilateral. Capillary return is immediate. Temperature gradient is WNL. Skin turgor WNL  Sensorium: Normal Semmes Weinstein monofilament test. Normal tactile sensation bilaterally. Nail Exam: Pt has thick disfigured discolored nails with subungual debris noted bilateral entire nail hallux through fifth toenails Ulcer Exam: There is no evidence of ulcer or pre-ulcerative changes or infection. Orthopedic Exam: Muscle tone and strength are WNL. No limitations in general ROM. No crepitus or effusions noted. Foot type and digits show no abnormalities. Bony prominences are unremarkable. Skin:  Porokeratosis   plantar aspect left hallux. Asymptomatic. Marland Kitchen No infection or ulcers.  Benign skin lesion sub 5th met left foot.  Diagnosis:  Onychomycosis, , Pain in right toe, pain in left toes,  Benign skin lesion.  Treatment & Plan Procedures and Treatment: Consent by patient was obtained for treatment procedures. The patient understood the discussion of treatment and procedures well. All questions were answered thoroughly reviewed. Debridement of mycotic and hypertrophic toenails, 1 through 5 bilateral and clearing of subungual debris. Debride benign skin lesion. Return Visit-Office Procedure: Patient instructed to return to the office for a follow up visit 3 months for continued evaluation and treatment.    Gardiner Barefoot DPM

## 2019-08-10 ENCOUNTER — Other Ambulatory Visit: Payer: Self-pay | Admitting: Endocrinology

## 2019-08-19 ENCOUNTER — Telehealth: Payer: Self-pay

## 2019-08-19 NOTE — Telephone Encounter (Signed)
This form was faxed back to them informing them that this paperwork must come from pt's pcp. They have also been notified of this in the past.

## 2019-08-19 NOTE — Telephone Encounter (Signed)
[facility] calling to check PA status for hip/neck/elbow braces -please call back if form has been received [phone] ask for Is[employer]

## 2019-08-29 ENCOUNTER — Telehealth: Payer: Self-pay | Admitting: Endocrinology

## 2019-08-29 NOTE — Telephone Encounter (Signed)
Made third attempt to contact someone at the number below. This went to voicemail again.

## 2019-08-29 NOTE — Telephone Encounter (Signed)
Made second attempt to reach someone at this number. Phone rang twice and went to a voicemail.

## 2019-08-29 NOTE — Telephone Encounter (Signed)
Attempted to call and notify this company that the PA will not be completed. They have requested that this office do prior authorizations for medical supplies such as knee braces in the past and they were informed that Dr. Dwyane Dee does not do this, as he is not the pt's PCP.

## 2019-08-29 NOTE — Telephone Encounter (Signed)
[employer] with [employer] ph# [phone] called  to request that the PA request that was faxed back to him on 08/19/19 and prior per note be re-faxed to [employer] at Fax# [phone] [employer] never received the above fax)

## 2019-09-07 ENCOUNTER — Other Ambulatory Visit: Payer: Self-pay | Admitting: Endocrinology

## 2019-09-08 NOTE — Telephone Encounter (Signed)
Noted  

## 2019-09-08 NOTE — Telephone Encounter (Signed)
2 mg in the morning only of Amaryl

## 2019-09-08 NOTE — Telephone Encounter (Signed)
Last office visit note states to resume Amaryl. Does he need to resume taking 2mg , 1 tab in am and 1/2 in pm?

## 2019-09-14 ENCOUNTER — Other Ambulatory Visit (INDEPENDENT_AMBULATORY_CARE_PROVIDER_SITE_OTHER): Payer: Medicare Other

## 2019-09-14 ENCOUNTER — Other Ambulatory Visit: Payer: Self-pay

## 2019-09-14 ENCOUNTER — Other Ambulatory Visit: Payer: Self-pay | Admitting: Endocrinology

## 2019-09-14 DIAGNOSIS — Z794 Long term (current) use of insulin: Secondary | ICD-10-CM

## 2019-09-14 DIAGNOSIS — E1165 Type 2 diabetes mellitus with hyperglycemia: Secondary | ICD-10-CM | POA: Diagnosis not present

## 2019-09-14 LAB — LIPID PANEL
Cholesterol: 175 mg/dL (ref 0–200)
HDL: 55 mg/dL (ref >39.00)
LDL Cholesterol: 109 mg/dL — ABNORMAL HIGH (ref 0–99)
NonHDL: 120.15
Total CHOL/HDL Ratio: 3
Triglycerides: 57 mg/dL (ref 0.0–149.0)
VLDL: 11.4 mg/dL (ref 0.0–40.0)

## 2019-09-14 LAB — BASIC METABOLIC PANEL
BUN: 26 mg/dL — ABNORMAL HIGH (ref 6–23)
CO2: 31 mEq/L (ref 19–32)
Calcium: 9.1 mg/dL (ref 8.4–10.5)
Chloride: 106 mEq/L (ref 96–112)
Creatinine, Ser: 1.37 mg/dL (ref 0.40–1.50)
GFR: 49 mL/min — ABNORMAL LOW (ref >60.00)
Glucose, Bld: 124 mg/dL — ABNORMAL HIGH (ref 70–99)
Potassium: 4.2 mEq/L (ref 3.5–5.1)
Sodium: 141 mEq/L (ref 135–145)

## 2019-09-14 LAB — HEMOGLOBIN A1C: Hgb A1c MFr Bld: 9.7 % — ABNORMAL HIGH (ref 4.6–6.5)

## 2019-09-14 MED ORDER — ONETOUCH DELICA LANCING DEV MISC: 0 refills | Status: DC

## 2019-09-14 MED ORDER — ONETOUCH DELICA LANCETS 30G MISC: 1.0000 | Freq: Three times a day (TID) | 3 refills | Status: DC

## 2019-09-15 ENCOUNTER — Other Ambulatory Visit: Payer: Medicare Other

## 2019-09-16 ENCOUNTER — Other Ambulatory Visit: Payer: Self-pay

## 2019-09-16 MED ORDER — ONETOUCH DELICA LANCETS 30G MISC: 1.0000 | Freq: Three times a day (TID) | 3 refills | Status: DC

## 2019-09-20 NOTE — Progress Notes (Signed)
Patient ID: [name], male   DOB: 10/07/1928, 86 y.o.   MRN: [id number]  I connected with the above-named patient by video enabled telemedicine application and verified that I am speaking with the correct person. The patient was explained the limitations of evaluation and management by telemedicine and the availability of in person appointments.  Patient also understood that there may be a patient responsible charge related to this service . Location of the patient: Patient's home . Location of the provider: Physician office Only the patient, his son and myself were participating in the encounter The patient understood the above statements and agreed to proceed.   Reason for Appointment: Diabetes follow-up   History of Present Illness   Diagnosis: Type 2 DIABETES MELITUS, diagnoses 1972   He has had long-standing diabetes and has been on basal bolus insulin regimen after failure of oral hypoglycemic drugs over the last several years His blood sugars have been generally fairly well controlled but has a tendency to high postprandial readings Previously required only a small dose of 5 units of Levemir insulin  Taking Amaryl in addition to his insulin regimen  his blood sugars had improved his blood sugars during the daytime  He has taken Toujeo since 9/16 instead of twice a day Levemir  RECENT history:   Insulin regimen:  TRESIBA 16 units at breakfast  Oral hypoglycemic drugs: Metformin 1000 mg a.m. and 500 mg p.m., Jardiance 10 mg daily Amaryl 2 mg in a.m. and 1 mg p.m.   Current management, blood sugar patterns and problems identified  His A1c is relatively better at 7.6 compared to 8.1   He is having a much higher A1c and not clear why  Difficult to analyze his blood sugar patterns s since his he checks his blood sugars only in the mornings and frequently these are right after eating  Blood sugars may be as high as 400 but he does not have any excess  carbohydrate, only eating oatmeal and some crackers in the morning at breakfast along with eggs and some peanut butter  He does not think he is forgetting his insulin or oral medications  He is not very active and occasionally may go out to do some activities in his yard but not much walking   Reportedly fasting blood sugars are usually not below 100 and also not above 160 Most blood sugars are done around the same time in the morning and some are about 30 minutes after eating   Side effects from medications: None          Proper timing of insulin in relation to meals: Yes.          Monitors blood glucose: <1 times a day.    Glucometer: One Prob[employer] .          Blood Glucose readings from  meter download  MORNING blood sugar range for the last 30 days 91-403 with average 210    PRE-MEAL Fasting Lunch  6 PM Bedtime Overall  Glucose range:  86-252   158, 180, 216    Mean/median:  165     174   POST-MEAL PC Breakfast PC Lunch PC Dinner  Glucose range:  166-313    Mean/median:      PREVIOUS readings:  PRE-MEAL Fasting Lunch Dinner Bedtime Overall  Glucose range:  99, 104   96, 173    Mean/median:     ?   POST-MEAL PC Breakfast PC Lunch PC Dinner  Glucose range:  178-253   236, 236, 274  Mean/median:         Meals: 3 meals per day. Breakfast at 8 am Lunch 12 noon Supper at 5-6 pm Drinks water mostly and no sweet drinks    Wt Readings from Last 3 Encounters:  06/20/19 191 lb 3.2 oz (86.7 kg)  03/31/19 187 lb (84.8 kg)  03/21/19 185 lb 9.6 oz (84.2 kg)   Lab Results  Component Value Date   HGBA1C 9.7 (H) 09/14/2019   HGBA1C 7.6 (A) 06/20/2019   HGBA1C 8.1 (A) 03/21/2019   Lab Results  Component Value Date   MICROALBUR 28.4 03/01/2019   LDLCALC 109 (H) 09/14/2019   CREATININE 1.37 09/14/2019     Allergies as of 09/21/2019      Reactions   Avodart [dutasteride] Swelling   Ibuprofen Nausea And Vomiting   Morphine Nausea And Vomiting      Medication List         Accurate as of September 21, 2019  8:31 AM. If you have any questions, ask your nurse or doctor.        azelastine 0.1 % nasal spray Commonly known as: ASTELIN Place 1 spray into both nostrils 2 (two) times daily. Use in each nostril as directed   B-D UF III MINI PEN NEEDLES 31G X 5 MM Misc Generic drug: Insulin Pen Needle USE 4 TIMES PER DAY   B-D UF III MINI PEN NEEDLES 31G X 5 MM Misc Generic drug: Insulin Pen Needle USE 4 TIMES PER DAY   cholecalciferol 1000 units tablet Commonly known as: VITAMIN D Take 1,000 Units by mouth daily.   clotrimazole-betamethasone cream Commonly known as: LOTRISONE Apply topically daily.   docusate sodium 100 MG capsule Commonly known as: COLACE Take 100 mg by mouth 2 (two) times daily.   donepezil 10 MG tablet Commonly known as: Aricept Take 1 tablet (10 mg total) by mouth at bedtime.   empagliflozin 10 MG Tabs tablet Commonly known as: Jardiance Take 10 mg by mouth daily with breakfast.   ferrous gluconate 325 MG tablet Commonly known as: FERGON Take 325 mg by mouth daily with breakfast.   fluticasone 50 MCG/ACT nasal spray Commonly known as: FLONASE PLACE 2 SPRAYS INTO BOTH NOSTRILS DAILY.   glimepiride 4 MG tablet Commonly known as: AMARYL TAKE 1 TABLET BY MOUTH DAILY BEFORE SUPPER   glimepiride 2 MG tablet Commonly known as: AMARYL Take 1 tablet (23m total) by mouth once daily in the morning.   memantine 5 MG tablet Commonly known as: Namenda Take 1 tablet (5 mg total) by mouth 2 (two) times daily.   metFORMIN 1000 MG tablet Commonly known as: GLUCOPHAGE TAKE 1 TABLET IN THE MORNING & 1/2 TABLET AT DINNERTIME   mometasone 0.1 % ointment Commonly known as: Elocon Apply topically daily.   omeprazole 20 MG capsule Commonly known as: PRILOSEC Take 20 mg by mouth daily.   ONE TOUCH DELICA LANCING DEV Misc Use to check blood sugar three times daily.   ONE TOUCH ULTRA 2 w/Device Kit Use to test blood sugar  daily Dx code EK80.03  OneTouch Delica Lancets 349ZMisc 1 each by Does not apply route in the morning, at noon, and at bedtime. Use to check blood sugar three times daily. DX:E11.65   OneTouch Verio test strip Generic drug: glucose blood Use as instructed to check blood sugar three times a day. Dx code E11.65   simvastatin 80 MG tablet Commonly known as:  ZOCOR Take 40 mg by mouth at bedtime.   Tyler Aas FlexTouch 100 UNIT/ML Sopn FlexTouch Pen Generic drug: insulin degludec Inject 16 Units into the skin daily.       Allergies:  Allergies  Allergen Reactions  . Avodart [Dutasteride] Swelling  . Ibuprofen Nausea And Vomiting  . Morphine Nausea And Vomiting    Past Medical History:  Diagnosis Date  . B12 deficiency   . BPH (benign prostatic hypertrophy)   . Colon polyps   . Diabetes mellitus   . GERD (gastroesophageal reflux disease)    hiatal hernia  . History of kidney stones   . Hyperlipidemia   . Hypertension   . Osteoarthritis     Past Surgical History:  Procedure Laterality Date  . APPENDECTOMY    . CARDIOVASCULAR STRESS TEST  09/02/2011   Post stress myocardial perfusion images show normal pattern of perfusion in all areas. No significant wall abnormalites noted.  Marland Kitchen CAROTID DOPPLER  01/27/2011   Right & Left ICAs 0-49% diameter reduction, right & left subclavian arteries demonstrated less than 50% diameter reduction.  . CYSTOSCOPY WITH BIOPSY  06/17/2012   Procedure: CYSTOSCOPY WITH BIOPSY;  Surgeon: Molli Hazard, MD;  Location: [facility];  Service: Urology;  Laterality: N/A;  . KNEE CARTILAGE SURGERY     Arthroscope  . Partial amputaion left foot    . TRANSTHORACIC ECHOCARDIOGRAM  09/02/2011   EF >55%, normal LV systolic function, mild diastolic dysfunction  . TRANSURETHRAL RESECTION OF PROSTATE  06/17/2012   Procedure: TRANSURETHRAL RESECTION OF THE PROSTATE WITH GYRUS INSTRUMENTS;  Surgeon: Molli Hazard, MD;  Location: [facility];  Service: Urology;   Laterality: N/A;    Family History  Problem Relation Age of Onset  . Heart attack Mother   . Hypertension Mother     Social History:  reports that he has quit smoking. He has never used smokeless tobacco. He reports that he does not drink alcohol or use drugs.  Review of Systems:  HYPERTENSION: Controlled, only on low-dose atenolol This has been followed by PCP Also on Jardiance 10 mg  HYPERLIPIDEMIA:   Treated with high-dose simvastatin  with good control as of 9/18  Lab Results  Component Value Date   CHOL 175 09/14/2019   HDL 55.00 09/14/2019   LDLCALC 109 (H) 09/14/2019   LDLDIRECT 89.0 06/20/2019   TRIG 57.0 09/14/2019   CHOLHDL 3 09/14/2019   Last foot exam: 07/2017      Examination:   There were no vitals taken for this visit.  There is no height or weight on file to calculate BMI.      ASSESSMENT/ PLAN:    Diabetes type 2 insulin Dependent  See history of present illness for detailed discussion of his current management, blood sugar patterns and problems identified   1C surprisingly improved at 7.3  His blood sugars are totally out of control with blood sugars averaging 350 This is markedly unusual for him since previously A1c was 7.5 He is also symptomatic with hyperglycemia  Part of his hypoglycemia is related to his difficulty remembering to take his insulin and may be getting to do that at least in the evening and may be in the morning also Also has had more stress with his wife and nursing home probably tending to eat out more in the evening  Clearly he is insulin deficient now Although unlikely that stopping Amaryl would cause hypoglycemia at this stage of his diabetes this may have happened before also on  one occasion   Recommendations   Restart Amaryl  Check chemistry panel   Change insulin to TRESIBA since his fasting readings may be the highest of the day and unlikely to be getting any higher postprandial readings  Also will not be able  to comply with the evening mixed insulin at suppertime; increasing the dose of premixed insulin may risk hypoglycemia overnight also  Since previously had not been able to manage the V-go pump on his own will not attempt this  His son was shown how to use the titration sheet given to him to adjust the [facility] based on fasting blood sugars every 3 to 5 days  We will start with 20 units and stop the 70/30 insulin for now  Continue Amaryl, Jardiance and metformin as long as renal function is normal  His son will call next week to give [place] his blood sugar readings for further adjustment  May consider consultation with diabetes educator also    There are no Patient Instructions on file for this visit.    Elayne Snare 09/21/2019, 8:31 AM                     Patient ID: [name], male   DOB: 1929-05-30, 86 y.o.   MRN: [id number]   Reason for Appointment: Diabetes follow-up   History of Present Illness   Diagnosis: Type 2 DIABETES MELITUS, diagnoses 1972   He has had long-standing diabetes and has been on basal bolus insulin regimen after failure of oral hypoglycemic drugs over the last several years His blood sugars have been generally fairly well controlled but has a tendency to high postprandial readings Previously required only a small dose of 5 units of Levemir insulin  Taking Amaryl in addition to his insulin regimen  his blood sugars had improved his blood sugars during the daytime  He had taken Toujeo since 9/16 instead of twice a day Levemir  RECENT history:   Insulin regimen:  Novolog mix 70/30: 15 units at breakfast, 8 acs Oral hypoglycemic drugs: Metformin 1500 mg , Jardiance 10 mg daily   A1c is 11.4 compared to 7.5% previously   Current management, blood sugar patterns and problems identified  His blood sugars are markedly increased since his last visit  According to his son he is probably not taking his evening insulin consistently or sometimes  after supper at night because he goes to the nursing home to visit his wife late afternoon  He did not take his insulin last night and today his blood sugar was 541  He has lost weight and also appears to be much more thirsty and getting more fatigued  His son thinks that he is also more stressed  AMARYL was stopped on his last visit while there medications were continued  He is checking only FASTING blood sugars and forgets to do readings later in the day  He has not changed his diet and also on his fluid intake he is still continuing to use diet drinks or water    Side effects from medications: None          Proper timing of insulin in relation to meals: Yes.          Monitors blood glucose: <1 times a day.    Glucometer: One Touch ultra 2 .          Blood Glucose readings   .Mean values apply above for all meters except median for One Touch  PRE-MEAL  Fasting Lunch Dinner Bedtime Overall  Glucose range:  157-541   ?   Mean/median:  349        PREVIOUS MEDIAN 161   Meals: 3 meals per day. Breakfast at 8 am Lunch 12 noon Supper at 5-6 pm; breakfast is usually oatmeal with eggs.  Drinks water mostly and no sweet drinks            Physical activity: exercise: occasionally, bike      Wt Readings from Last 3 Encounters:  06/20/19 191 lb 3.2 oz (86.7 kg)  03/31/19 187 lb (84.8 kg)  03/21/19 185 lb 9.6 oz (84.2 kg)   Lab Results  Component Value Date   HGBA1C 9.7 (H) 09/14/2019   HGBA1C 7.6 (A) 06/20/2019   HGBA1C 8.1 (A) 03/21/2019   Lab Results  Component Value Date   MICROALBUR 28.4 03/01/2019   LDLCALC 109 (H) 09/14/2019   CREATININE 1.37 09/14/2019     Allergies as of 09/21/2019      Reactions   Avodart [dutasteride] Swelling   Ibuprofen Nausea And Vomiting   Morphine Nausea And Vomiting      Medication List       Accurate as of September 21, 2019  8:31 AM. If you have any questions, ask your nurse or doctor.        azelastine 0.1 % nasal  spray Commonly known as: ASTELIN Place 1 spray into both nostrils 2 (two) times daily. Use in each nostril as directed   B-D UF III MINI PEN NEEDLES 31G X 5 MM Misc Generic drug: Insulin Pen Needle USE 4 TIMES PER DAY   B-D UF III MINI PEN NEEDLES 31G X 5 MM Misc Generic drug: Insulin Pen Needle USE 4 TIMES PER DAY   cholecalciferol 1000 units tablet Commonly known as: VITAMIN D Take 1,000 Units by mouth daily.   clotrimazole-betamethasone cream Commonly known as: LOTRISONE Apply topically daily.   docusate sodium 100 MG capsule Commonly known as: COLACE Take 100 mg by mouth 2 (two) times daily.   donepezil 10 MG tablet Commonly known as: Aricept Take 1 tablet (10 mg total) by mouth at bedtime.   empagliflozin 10 MG Tabs tablet Commonly known as: Jardiance Take 10 mg by mouth daily with breakfast.   ferrous gluconate 325 MG tablet Commonly known as: FERGON Take 325 mg by mouth daily with breakfast.   fluticasone 50 MCG/ACT nasal spray Commonly known as: FLONASE PLACE 2 SPRAYS INTO BOTH NOSTRILS DAILY.   glimepiride 4 MG tablet Commonly known as: AMARYL TAKE 1 TABLET BY MOUTH DAILY BEFORE SUPPER   glimepiride 2 MG tablet Commonly known as: AMARYL Take 1 tablet (35m total) by mouth once daily in the morning.   memantine 5 MG tablet Commonly known as: Namenda Take 1 tablet (5 mg total) by mouth 2 (two) times daily.   metFORMIN 1000 MG tablet Commonly known as: GLUCOPHAGE TAKE 1 TABLET IN THE MORNING & 1/2 TABLET AT DINNERTIME   mometasone 0.1 % ointment Commonly known as: Elocon Apply topically daily.   omeprazole 20 MG capsule Commonly known as: PRILOSEC Take 20 mg by mouth daily.   ONE TOUCH DELICA LANCING DEV Misc Use to check blood sugar three times daily.   ONE TOUCH ULTRA 2 w/Device Kit Use to test blood sugar daily Dx code EH82.99  OneTouch Delica Lancets 337JMisc 1 each by Does not apply route in the morning, at noon, and at bedtime. Use to  check blood sugar three times  daily. DX:E11.65   OneTouch Verio test strip Generic drug: glucose blood Use as instructed to check blood sugar three times a day. Dx code E11.65   simvastatin 80 MG tablet Commonly known as: ZOCOR Take 40 mg by mouth at bedtime.   Tyler Aas FlexTouch 100 UNIT/ML Sopn FlexTouch Pen Generic drug: insulin degludec Inject 16 Units into the skin daily.       Allergies:  Allergies  Allergen Reactions  . Avodart [Dutasteride] Swelling  . Ibuprofen Nausea And Vomiting  . Morphine Nausea And Vomiting    Past Medical History:  Diagnosis Date  . B12 deficiency   . BPH (benign prostatic hypertrophy)   . Colon polyps   . Diabetes mellitus   . GERD (gastroesophageal reflux disease)    hiatal hernia  . History of kidney stones   . Hyperlipidemia   . Hypertension   . Osteoarthritis     Past Surgical History:  Procedure Laterality Date  . APPENDECTOMY    . CARDIOVASCULAR STRESS TEST  09/02/2011   Post stress myocardial perfusion images show normal pattern of perfusion in all areas. No significant wall abnormalites noted.  Marland Kitchen CAROTID DOPPLER  01/27/2011   Right & Left ICAs 0-49% diameter reduction, right & left subclavian arteries demonstrated less than 50% diameter reduction.  . CYSTOSCOPY WITH BIOPSY  06/17/2012   Procedure: CYSTOSCOPY WITH BIOPSY;  Surgeon: Molli Hazard, MD;  Location: [facility];  Service: Urology;  Laterality: N/A;  . KNEE CARTILAGE SURGERY     Arthroscope  . Partial amputaion left foot    . TRANSTHORACIC ECHOCARDIOGRAM  09/02/2011   EF >55%, normal LV systolic function, mild diastolic dysfunction  . TRANSURETHRAL RESECTION OF PROSTATE  06/17/2012   Procedure: TRANSURETHRAL RESECTION OF THE PROSTATE WITH GYRUS INSTRUMENTS;  Surgeon: Molli Hazard, MD;  Location: [facility];  Service: Urology;  Laterality: N/A;    Family History  Problem Relation Age of Onset  . Heart attack Mother   . Hypertension Mother     Social  History:  reports that he has quit smoking. He has never used smokeless tobacco. He reports that he does not drink alcohol or use drugs.  Review of Systems:  HYPERTENSION:   followed by PCP Not on any medications now However is on Jardiance   BP Readings from Last 3 Encounters:  06/20/19 (!) 122/56  03/31/19 120/68  03/21/19 (!) 108/58   Had mild increase in creatinine recently, apparently not on any nonsteroidal drugs  Lab Results  Component Value Date   CREATININE 1.37 09/14/2019   CREATININE 1.22 06/20/2019   CREATININE 1.1 03/01/2019     HYPERLIPIDEMIA:   Treated with 80 mg simvastatin by his PCP with good control in the past  He is not sure if he is taking this regularly and LDL is higher than usual  Lab Results  Component Value Date   CHOL 175 09/14/2019   CHOL 125 09/14/2018   CHOL 132 11/18/2017   Lab Results  Component Value Date   HDL 55.00 09/14/2019   HDL 54.00 09/14/2018   HDL 55.70 11/18/2017   Lab Results  Component Value Date   LDLCALC 109 (H) 09/14/2019   LDLCALC 59 03/01/2019   LDLCALC 59 09/14/2018   Lab Results  Component Value Date   TRIG 57.0 09/14/2019   TRIG 58.0 09/14/2018   TRIG 82.0 11/18/2017   Lab Results  Component Value Date   CHOLHDL 3 09/14/2019   CHOLHDL 2 09/14/2018   CHOLHDL 2 11/18/2017  Lab Results  Component Value Date   LDLDIRECT 89.0 06/20/2019    Last foot exam:  foot exam in 9/20 with podiatrist with the following findings:  Vascular: dorsalis pedis and posterior tibial pulses are palpable bilateral. Capillary return is immediate. Temperature gradient is WNL. Skin turgor WNL  Sensorium: Normal Semmes Weinstein monofilament test. Normal tactile sensation bilaterally.  Last eye exam 7/19  No history of recurrent UTI   Examination:   There were no vitals taken for this visit.  There is no height or weight on file to calculate BMI.    ASSESSMENT/ PLAN:    Diabetes type 2 insulin requiring  See  history of present illness for detailed discussion of his current management, blood sugar patterns and problems identified  His A1c is significantly higher at 9.7  Not clear why his control is worse Blood sugars are periodically higher but not clear if they are only high after breakfast at times with inconsistent readings Blood sugars are being checked within 30 minutes of eating also His son feels that his fasting readings before breakfast are not over 160 and have been as low as 91  Although he likely needs mealtime insulin also this will be difficult to enforce because of his mild dementia and difficulty with compliance Some of his higher sugars may also be related to inconsistent compliance with either insulin or oral medications  Diet does not appear to be any different recently and breakfast is not very high carbohydrate, does have some protein regularly Currently not very active   Recommendations today:  Increase Jardiance to 2 tablets and from the next prescription use 25 mg However will need to recheck renal function in 3 to 4 weeks to make sure this is not showing any worsening  No change in [place] dosage at this time Recommended starting using his stationary bike or walking outside regularly His son will try to check some readings 2 hours after dinner If blood sugars are consistently much higher after breakfast may consider adding small dose of Humalog or NovoLog at breakfast only along with his [place] dose  LIPIDS: His LDL is significantly higher than before and previously was well controlled with simvastatin He will check to see if he is getting his simvastatin and if not he will request a new prescription Check LDL level again on the next visit  Rising creatinine: This is mild increase but not clear of the reason, not on antihypertensives and only on Jardiance Repeat labs in 3 to 4 weeks  Follow-up in 3 months   There are no Patient Instructions on file for this  visit.    Elayne Snare 09/21/2019, 8:31 AM

## 2019-09-21 ENCOUNTER — Other Ambulatory Visit: Payer: Self-pay

## 2019-09-21 ENCOUNTER — Ambulatory Visit (INDEPENDENT_AMBULATORY_CARE_PROVIDER_SITE_OTHER): Payer: Medicare Other | Admitting: Endocrinology

## 2019-09-21 ENCOUNTER — Encounter: Payer: Self-pay | Admitting: Endocrinology

## 2019-09-21 DIAGNOSIS — N289 Disorder of kidney and ureter, unspecified: Secondary | ICD-10-CM

## 2019-09-21 DIAGNOSIS — E1165 Type 2 diabetes mellitus with hyperglycemia: Secondary | ICD-10-CM | POA: Diagnosis not present

## 2019-09-21 DIAGNOSIS — Z794 Long term (current) use of insulin: Secondary | ICD-10-CM

## 2019-09-21 DIAGNOSIS — E7800 Pure hypercholesterolemia, unspecified: Secondary | ICD-10-CM | POA: Diagnosis not present

## 2019-10-18 ENCOUNTER — Other Ambulatory Visit: Payer: Self-pay

## 2019-10-18 ENCOUNTER — Telehealth: Payer: Self-pay | Admitting: Endocrinology

## 2019-10-18 MED ORDER — JARDIANCE 10 MG PO TABS: 10.0000 mg | ORAL_TABLET | Freq: Every day | ORAL | 3 refills | Status: DC

## 2019-10-18 NOTE — Telephone Encounter (Signed)
Rx sent 

## 2019-10-18 NOTE — Telephone Encounter (Signed)
MEDICATION: empagliflozin (JARDIANCE)  PHARMACY:  [id number] - [place], [place] - [address] AT [place] Phone:  [phone]  Fax:  [phone]       IS THIS A 90 DAY SUPPLY : no, 30  IS PATIENT OUT OF MEDICATION: no  IF NOT; HOW MUCH IS LEFT: a few days left  LAST APPOINTMENT DATE: @2 /24/2021  NEXT APPOINTMENT DATE:@Visit  date not found  DO WE HAVE YOUR PERMISSION TO LEAVE A DETAILED MESSAGE: yes  OTHER COMMENTS:    **Let patient know to contact pharmacy at the end of the day to make sure medication is ready. **  ** Please notify patient to allow 48-72 hours to process**  **Encourage patient to contact the pharmacy for refills or they can request refills through [facility]**

## 2019-10-24 ENCOUNTER — Ambulatory Visit: Payer: Medicare Other | Admitting: Podiatry

## 2019-11-03 ENCOUNTER — Other Ambulatory Visit: Payer: Self-pay

## 2019-11-03 ENCOUNTER — Telehealth: Payer: Self-pay

## 2019-11-03 MED ORDER — JARDIANCE 10 MG PO TABS: 10.0000 mg | ORAL_TABLET | Freq: Every day | ORAL | 1 refills | Status: DC

## 2019-11-03 NOTE — Telephone Encounter (Signed)
Rx signed and faxed.

## 2019-11-03 NOTE — Telephone Encounter (Signed)
MEDICATION: empagliflozin  PHARMACY: [facility] clinic fax # [phone]   IS THIS A 90 DAY SUPPLY : yes  IS PATIENT OUT OF MEDICATION: no  IF NOT; HOW MUCH IS LEFT:   LAST APPOINTMENT DATE: @3 /23/2021  NEXT APPOINTMENT DATE:@Visit  date not found  DO WE HAVE YOUR PERMISSION TO LEAVE A DETAILED MESSAGE:  OTHER COMMENTS:the rx was increased to a whole tablet daily    **Let patient know to contact pharmacy at the end of the day to make sure medication is ready. **  ** Please notify patient to allow 48-72 hours to process**  **Encourage patient to contact the pharmacy for refills or they can request refills through [facility]**

## 2019-11-07 ENCOUNTER — Ambulatory Visit (INDEPENDENT_AMBULATORY_CARE_PROVIDER_SITE_OTHER): Payer: Medicare Other | Admitting: Podiatry

## 2019-11-07 ENCOUNTER — Other Ambulatory Visit: Payer: Self-pay

## 2019-11-07 ENCOUNTER — Encounter: Payer: Self-pay | Admitting: Podiatry

## 2019-11-07 VITALS — Temp 98.6°F

## 2019-11-07 DIAGNOSIS — B351 Tinea unguium: Secondary | ICD-10-CM

## 2019-11-07 DIAGNOSIS — E119 Type 2 diabetes mellitus without complications: Secondary | ICD-10-CM

## 2019-11-07 DIAGNOSIS — M79675 Pain in left toe(s): Secondary | ICD-10-CM | POA: Diagnosis not present

## 2019-11-07 DIAGNOSIS — M79674 Pain in right toe(s): Secondary | ICD-10-CM | POA: Diagnosis not present

## 2019-11-07 DIAGNOSIS — L989 Disorder of the skin and subcutaneous tissue, unspecified: Secondary | ICD-10-CM

## 2019-11-07 NOTE — Progress Notes (Signed)
This patient returns to my office for at risk foot care.  This patient requires this care by a professional since this patient will be at risk due to having diabetes mellitus and chronic kidney disease.  This patient is unable to cut nails himself since the patient cannot reach his nails.These nails are painful walking and wearing shoes.  This patient presents for at risk foot care today.  General Appearance  Alert, conversant and in no acute stress.  Vascular  Dorsalis pedis and posterior tibial  pulses are palpable  bilaterally.  Capillary return is within normal limits  bilaterally. Temperature is within normal limits  bilaterally.  Neurologic  Senn-Weinstein monofilament wire test within normal limits  bilaterally. Muscle power within normal limits bilaterally.  Nails Thick disfigured discolored nails with subungual debris  from hallux to fifth toes bilaterally. No evidence of bacterial infection or drainage bilaterally.  Orthopedic  No limitations of motion  feet .  No crepitus or effusions noted.  No bony pathology or digital deformities noted.  Skin  normotropic skin with no porokeratosis noted bilaterally.  No signs of infections or ulcers noted.   Asymptomatic callus sub hallux left foot.  Onychomycosis  Pain in right toes  Pain in left toes  Consent was obtained for treatment procedures.   Mechanical debridement of nails 1-5  bilaterally performed with a nail nipper.  Filed with dremel without incident.    Return office visit   3 months                   Told patient to return for periodic foot care and evaluation due to potential at risk complications.   Gardiner Barefoot DPM

## 2019-11-29 ENCOUNTER — Telehealth: Payer: Self-pay | Admitting: Family Medicine

## 2019-11-29 NOTE — Chronic Care Management (AMB) (Signed)
°  Chronic Care Management   Note  11/29/2019 Name: [name] MRN: [id number] DOB: [birth year]  [name] is a 85 y.o. year old male who is a primary care patient of Pickard, Cammie Mcgee, MD. I reached out to [facility] by phone today in response to a referral sent by Mr. [name] Mems Bluitt's PCP, Dennard Schaumann Cammie Mcgee, MD.   [name] about Chronic Care Management services today including:  1. CCM service includes personalized support from designated clinical staff supervised by his physician, including individualized plan of care and coordination with other care providers 2. 24/7 contact phone numbers for assistance for urgent and routine care needs. 3. Service will only be billed when office clinical staff spend 20 minutes or more in a month to coordinate care. 4. Only one practitioner may furnish and bill the service in a calendar month. 5. The patient may stop CCM services at any time (effective at the end of the month) by phone call to the office staff.   [name] and verbal consent obtained.   Follow up plan:   [facility]

## 2019-11-29 NOTE — Chronic Care Management (AMB) (Signed)
  Chronic Care Management   Outreach Note  11/29/2019 Name: MORDEKAI WILLE MRN: [id number] DOB: 1929/03/21  Referred by: Susy Frizzle, MD Reason for referral : Chronic Care Management   An unsuccessful telephone outreach was attempted today. The patient was referred to the pharmacist for assistance with care management and care coordination.   Follow Up Plan:   [place]

## 2019-12-05 ENCOUNTER — Other Ambulatory Visit: Payer: Self-pay

## 2019-12-05 ENCOUNTER — Ambulatory Visit (INDEPENDENT_AMBULATORY_CARE_PROVIDER_SITE_OTHER): Payer: Medicare Other | Admitting: Family Medicine

## 2019-12-05 VITALS — BP 120/62 | HR 68 | Temp 97.1°F | Resp 16 | Ht 69.0 in | Wt 201.0 lb

## 2019-12-05 DIAGNOSIS — L723 Sebaceous cyst: Secondary | ICD-10-CM | POA: Diagnosis not present

## 2019-12-05 NOTE — Progress Notes (Signed)
Subjective:    Patient ID: [name], male    DOB: [birth year], 86 y.o.   MRN: [id number]  HPI  Patient has a 3 cm inflamed sebaceous cyst in his left mid axillary line on his lower ribs.  It has been there for years however has become red hot and painful recently.  He is here today for treatment. Past Medical History:  Diagnosis Date  . B12 deficiency   . BPH (benign prostatic hypertrophy)   . Colon polyps   . Diabetes mellitus   . GERD (gastroesophageal reflux disease)    hiatal hernia  . History of kidney stones   . Hyperlipidemia   . Hypertension   . Osteoarthritis    Past Surgical History:  Procedure Laterality Date  . APPENDECTOMY    . CARDIOVASCULAR STRESS TEST  09/02/2011   Post stress myocardial perfusion images show normal pattern of perfusion in all areas. No significant wall abnormalites noted.  Marland Kitchen CAROTID DOPPLER  01/27/2011   Right & Left ICAs 0-49% diameter reduction, right & left subclavian arteries demonstrated less than 50% diameter reduction.  . CYSTOSCOPY WITH BIOPSY  06/17/2012   Procedure: CYSTOSCOPY WITH BIOPSY;  Surgeon: Molli Hazard, MD;  Location: [facility];  Service: Urology;  Laterality: N/A;  . KNEE CARTILAGE SURGERY     Arthroscope  . Partial amputaion left foot    . TRANSTHORACIC ECHOCARDIOGRAM  09/02/2011   EF >55%, normal LV systolic function, mild diastolic dysfunction  . TRANSURETHRAL RESECTION OF PROSTATE  06/17/2012   Procedure: TRANSURETHRAL RESECTION OF THE PROSTATE WITH GYRUS INSTRUMENTS;  Surgeon: Molli Hazard, MD;  Location: [facility];  Service: Urology;  Laterality: N/A;   Current Outpatient Medications on File Prior to Visit  Medication Sig Dispense Refill  . azelastine (ASTELIN) 0.1 % nasal spray Place 1 spray into both nostrils 2 (two) times daily. Use in each nostril as directed 30 mL 3  . B-D UF III MINI PEN NEEDLES 31G X 5 MM MISC USE 4 TIMES PER DAY 100 each 2  . Blood Glucose Monitoring Suppl (ONE TOUCH ULTRA 2)  w/Device KIT Use to test blood sugar daily Dx code E11.65 1 each 1  . cholecalciferol (VITAMIN D) 1000 UNITS tablet Take 1,000 Units by mouth daily.    . clotrimazole-betamethasone (LOTRISONE) cream Apply topically daily. 30 g 1  . docusate sodium (COLACE) 100 MG capsule Take 100 mg by mouth 2 (two) times daily.    Marland Kitchen donepezil (ARICEPT) 10 MG tablet Take 1 tablet (10 mg total) by mouth at bedtime. 90 tablet 3  . empagliflozin (JARDIANCE) 10 MG TABS tablet Take 10 mg by mouth daily with breakfast. 90 tablet 1  . ferrous gluconate (FERGON) 325 MG tablet Take 325 mg by mouth daily with breakfast.    . fluticasone (FLONASE) 50 MCG/ACT nasal spray PLACE 2 SPRAYS INTO BOTH NOSTRILS DAILY. 48 g 5  . glimepiride (AMARYL) 4 MG tablet TAKE 1 TABLET BY MOUTH DAILY BEFORE SUPPER 90 tablet 1  . glucose blood (ONETOUCH VERIO) test strip Use as instructed to check blood sugar three times a day. Dx code E11.65 100 each 12  . insulin degludec (TRESIBA FLEXTOUCH) 100 UNIT/ML SOPN FlexTouch Pen Inject 16 Units into the skin daily.    . Insulin Pen Needle (B-D UF III MINI PEN NEEDLES) 31G X 5 MM MISC USE 4 TIMES PER DAY 125 each 3  . Lancet Devices (ONE TOUCH DELICA LANCING DEV) MISC Use to check blood sugar  three times daily. 1 each 0  . memantine (NAMENDA) 5 MG tablet Take 1 tablet (5 mg total) by mouth 2 (two) times daily. 180 tablet 3  . metFORMIN (GLUCOPHAGE) 1000 MG tablet TAKE 1 TABLET IN THE MORNING & 1/2 TABLET AT DINNERTIME 135 tablet 2  . mometasone (ELOCON) 0.1 % ointment Apply topically daily. 45 g 0  . omeprazole (PRILOSEC) 20 MG capsule Take 20 mg by mouth daily.      Glory Rosebush Delica Lancets 73X MISC 1 each by Does not apply route in the morning, at noon, and at bedtime. Use to check blood sugar three times daily. DX:E11.65 100 each 3  . simvastatin (ZOCOR) 80 MG tablet Take 40 mg by mouth at bedtime.    Marland Kitchen glimepiride (AMARYL) 2 MG tablet Take 1 tablet (40m total) by mouth once daily in the morning.  90 tablet 1   Current Facility-Administered Medications on File Prior to Visit  Medication Dose Route Frequency Provider Last Rate Last Admin  . cyanocobalamin ((VITAMIN B-12)) injection 1,000 mcg  1,000 mcg Intramuscular Q30 days PSusy Frizzle MD   1,000 mcg at 09/28/18 1629   Allergies  Allergen Reactions  . Avodart [Dutasteride] Swelling  . Ibuprofen Nausea And Vomiting  . Morphine Nausea And Vomiting   Social History   Socioeconomic History  . Marital status: Married    Spouse name: Not on file  . Number of children: Not on file  . Years of education: Not on file  . Highest education level: Not on file  Occupational History  . Not on file  Tobacco Use  . Smoking status: Former SResearch scientist (life sciences) . Smokeless tobacco: Never Used  Substance and Sexual Activity  . Alcohol use: No    Alcohol/week: 0.0 standard drinks  . Drug use: No  . Sexual activity: Not on file    Comment: married to MSilkworth retired.  Other Topics Concern  . Not on file  Social History Narrative  . Not on file   Social Determinants of Health   Financial Resource Strain:   . Difficulty of Paying Living Expenses:   Food Insecurity:   . Worried About RCharity fundraiserin the Last Year:   . RArboriculturistin the Last Year:   Transportation Needs:   . LFilm/video editor(Medical):   .Marland KitchenLack of Transportation (Non-Medical):   Physical Activity:   . Days of Exercise per Week:   . Minutes of Exercise per Session:   Stress:   . Feeling of Stress :   Social Connections:   . Frequency of Communication with Friends and Family:   . Frequency of Social Gatherings with Friends and Family:   . Attends Religious Services:   . Active Member of Clubs or Organizations:   . Attends CArchivistMeetings:   .Marland KitchenMarital Status:   Intimate Partner Violence:   . Fear of Current or Ex-Partner:   . Emotionally Abused:   .Marland KitchenPhysically Abused:   . Sexually Abused:      Review of Systems  All other systems  reviewed and are negative.      Objective:   Physical Exam Cardiovascular:     Rate and Rhythm: Normal rate and regular rhythm.     Heart sounds: Normal heart sounds.  Pulmonary:     Effort: Pulmonary effort is normal.     Breath sounds: Normal breath sounds.  Chest:    Neurological:     Mental Status:  He is alert.           Assessment & Plan:  Inflamed sebaceous cyst  Lesion was anesthetized with 0.1% lidocaine with epinephrine.  A 1 cm vertical incision was made in the center of the cyst and copious cyst sac contents were expressed through the opening.  The wound was then probed with Q-tip soaked in hydrogen peroxide and then packed with 4 inches of 1/4 inch iodoform gauze.  Patient tolerated procedure well with minimal blood loss.  Wound care was discussed.  Recheck in 1 week if persistent after gauze is removed.  Return sooner if worsening.  Some perform dressing changes.

## 2019-12-06 ENCOUNTER — Ambulatory Visit (INDEPENDENT_AMBULATORY_CARE_PROVIDER_SITE_OTHER): Payer: Medicare Other | Admitting: Family Medicine

## 2019-12-06 ENCOUNTER — Telehealth: Payer: Self-pay

## 2019-12-06 ENCOUNTER — Other Ambulatory Visit: Payer: Self-pay | Admitting: Family Medicine

## 2019-12-06 DIAGNOSIS — L03313 Cellulitis of chest wall: Secondary | ICD-10-CM

## 2019-12-06 MED ORDER — CEPHALEXIN 500 MG PO CAPS: 500.0000 mg | ORAL_CAPSULE | Freq: Three times a day (TID) | ORAL | 0 refills | Status: DC

## 2019-12-06 NOTE — Telephone Encounter (Signed)
Son aware and bringing him in

## 2019-12-06 NOTE — Telephone Encounter (Signed)
Most likely got pulled out by accident.  Be glad to see him to see.

## 2019-12-06 NOTE — Progress Notes (Signed)
Subjective:    Patient ID: [name], male    DOB: [birth year], 86 y.o.   MRN: [id number]  HPI 12/05/19 Patient has a 3 cm inflamed sebaceous cyst in his left mid axillary line on his lower ribs.  It has been there for years however has become red hot and painful recently.  He is here today for treatment.  At that time, my plan was: Lesion was anesthetized with 0.1% lidocaine with epinephrine.  A 1 cm vertical incision was made in the center of the cyst and copious cyst sac contents were expressed through the opening.  The wound was then probed with Q-tip soaked in hydrogen peroxide and then packed with 4 inches of 1/4 inch iodoform gauze.  Patient tolerated procedure well with minimal blood loss.  Wound care was discussed.  Recheck in 1 week if persistent after gauze is removed.  Return sooner if worsening.  Some perform dressing changes.  12/06/19 Patient returns today I removed the packing.  I was able to express further cyst sac contents with firm palpation.  I then repacked the wound with 3 inches of 1/4 inch iodoform gauze.  However the skin around the incision site was becoming erythematous and warm suggesting possible cellulitis. Past Medical History:  Diagnosis Date  . B12 deficiency   . BPH (benign prostatic hypertrophy)   . Colon polyps   . Diabetes mellitus   . GERD (gastroesophageal reflux disease)    hiatal hernia  . History of kidney stones   . Hyperlipidemia   . Hypertension   . Osteoarthritis    Past Surgical History:  Procedure Laterality Date  . APPENDECTOMY    . CARDIOVASCULAR STRESS TEST  09/02/2011   Post stress myocardial perfusion images show normal pattern of perfusion in all areas. No significant wall abnormalites noted.  Marland Kitchen CAROTID DOPPLER  01/27/2011   Right & Left ICAs 0-49% diameter reduction, right & left subclavian arteries demonstrated less than 50% diameter reduction.  . CYSTOSCOPY WITH BIOPSY  06/17/2012   Procedure: CYSTOSCOPY WITH BIOPSY;   Surgeon: Molli Hazard, MD;  Location: [facility];  Service: Urology;  Laterality: N/A;  . KNEE CARTILAGE SURGERY     Arthroscope  . Partial amputaion left foot    . TRANSTHORACIC ECHOCARDIOGRAM  09/02/2011   EF >55%, normal LV systolic function, mild diastolic dysfunction  . TRANSURETHRAL RESECTION OF PROSTATE  06/17/2012   Procedure: TRANSURETHRAL RESECTION OF THE PROSTATE WITH GYRUS INSTRUMENTS;  Surgeon: Molli Hazard, MD;  Location: [facility];  Service: Urology;  Laterality: N/A;   Current Outpatient Medications on File Prior to Visit  Medication Sig Dispense Refill  . azelastine (ASTELIN) 0.1 % nasal spray Place 1 spray into both nostrils 2 (two) times daily. Use in each nostril as directed 30 mL 3  . B-D UF III MINI PEN NEEDLES 31G X 5 MM MISC USE 4 TIMES PER DAY 100 each 2  . Blood Glucose Monitoring Suppl (ONE TOUCH ULTRA 2) w/Device KIT Use to test blood sugar daily Dx code E11.65 1 each 1  . cephALEXin (KEFLEX) 500 MG capsule Take 1 capsule (500 mg total) by mouth 3 (three) times daily. 21 capsule 0  . cholecalciferol (VITAMIN D) 1000 UNITS tablet Take 1,000 Units by mouth daily.    . clotrimazole-betamethasone (LOTRISONE) cream Apply topically daily. 30 g 1  . docusate sodium (COLACE) 100 MG capsule Take 100 mg by mouth 2 (two) times daily.    Marland Kitchen donepezil (ARICEPT) 10 MG  tablet Take 1 tablet (10 mg total) by mouth at bedtime. 90 tablet 3  . empagliflozin (JARDIANCE) 10 MG TABS tablet Take 10 mg by mouth daily with breakfast. 90 tablet 1  . ferrous gluconate (FERGON) 325 MG tablet Take 325 mg by mouth daily with breakfast.    . fluticasone (FLONASE) 50 MCG/ACT nasal spray PLACE 2 SPRAYS INTO BOTH NOSTRILS DAILY. 48 g 5  . glimepiride (AMARYL) 2 MG tablet Take 1 tablet (57m total) by mouth once daily in the morning. 90 tablet 1  . glimepiride (AMARYL) 4 MG tablet TAKE 1 TABLET BY MOUTH DAILY BEFORE SUPPER 90 tablet 1  . glucose blood (ONETOUCH VERIO) test strip Use as  instructed to check blood sugar three times a day. Dx code E11.65 100 each 12  . insulin degludec (TRESIBA FLEXTOUCH) 100 UNIT/ML SOPN FlexTouch Pen Inject 16 Units into the skin daily.    . Insulin Pen Needle (B-D UF III MINI PEN NEEDLES) 31G X 5 MM MISC USE 4 TIMES PER DAY 125 each 3  . Lancet Devices (ONE TOUCH DELICA LANCING DEV) MISC Use to check blood sugar three times daily. 1 each 0  . memantine (NAMENDA) 5 MG tablet Take 1 tablet (5 mg total) by mouth 2 (two) times daily. 180 tablet 3  . metFORMIN (GLUCOPHAGE) 1000 MG tablet TAKE 1 TABLET IN THE MORNING & 1/2 TABLET AT DINNERTIME 135 tablet 2  . mometasone (ELOCON) 0.1 % ointment Apply topically daily. 45 g 0  . omeprazole (PRILOSEC) 20 MG capsule Take 20 mg by mouth daily.      .Glory RosebushDelica Lancets 367YMISC 1 each by Does not apply route in the morning, at noon, and at bedtime. Use to check blood sugar three times daily. DX:E11.65 100 each 3  . simvastatin (ZOCOR) 80 MG tablet Take 40 mg by mouth at bedtime.     Current Facility-Administered Medications on File Prior to Visit  Medication Dose Route Frequency Provider Last Rate Last Admin  . cyanocobalamin ((VITAMIN B-12)) injection 1,000 mcg  1,000 mcg Intramuscular Q30 days PSusy Frizzle MD   1,000 mcg at 09/28/18 1629   Allergies  Allergen Reactions  . Avodart [Dutasteride] Swelling  . Ibuprofen Nausea And Vomiting  . Morphine Nausea And Vomiting   Social History   Socioeconomic History  . Marital status: Married    Spouse name: Not on file  . Number of children: Not on file  . Years of education: Not on file  . Highest education level: Not on file  Occupational History  . Not on file  Tobacco Use  . Smoking status: Former SResearch scientist (life sciences) . Smokeless tobacco: Never Used  Substance and Sexual Activity  . Alcohol use: No    Alcohol/week: 0.0 standard drinks  . Drug use: No  . Sexual activity: Not on file    Comment: married to MBaylis retired.  Other Topics Concern   . Not on file  Social History Narrative  . Not on file   Social Determinants of Health   Financial Resource Strain:   . Difficulty of Paying Living Expenses:   Food Insecurity:   . Worried About RCharity fundraiserin the Last Year:   . RArboriculturistin the Last Year:   Transportation Needs:   . LFilm/video editor(Medical):   .Marland KitchenLack of Transportation (Non-Medical):   Physical Activity:   . Days of Exercise per Week:   . Minutes of Exercise per Session:  Stress:   . Feeling of Stress :   Social Connections:   . Frequency of Communication with Friends and Family:   . Frequency of Social Gatherings with Friends and Family:   . Attends Religious Services:   . Active Member of Clubs or Organizations:   . Attends Archivist Meetings:   Marland Kitchen Marital Status:   Intimate Partner Violence:   . Fear of Current or Ex-Partner:   . Emotionally Abused:   Marland Kitchen Physically Abused:   . Sexually Abused:      Review of Systems  All other systems reviewed and are negative.      Objective:   Physical Exam Cardiovascular:     Rate and Rhythm: Normal rate and regular rhythm.     Heart sounds: Normal heart sounds.  Pulmonary:     Effort: Pulmonary effort is normal.     Breath sounds: Normal breath sounds.  Chest:    Neurological:     Mental Status: He is alert.           Assessment & Plan:  Cellulitis of chest wall  Begin Keflex 500 mg p.o. 3 times daily for 7 days.  Begin to remove the packing tomorrow 1/2 inch per day over the next 3 to 4 days anticipate all the packing will be removed.  Recheck next week if no better or sooner if worse.

## 2019-12-06 NOTE — Telephone Encounter (Signed)
Pt's son called and stated that pt came in yesterday to have cyst cut and the son stated when he went to go remove the gauze this morning, it was gone. Son is scared that the pt pushed the gauze inside the hole. Please advise.

## 2019-12-16 NOTE — Chronic Care Management (AMB) (Addendum)
Chronic Care Management Pharmacy  Name: [name]  MRN: [id number] DOB: 10/03/1928  Chief Complaint/ HPI  [name],  86 y.o. , male presents for their Initial CCM visit with the clinical pharmacist via telephone.  PCP : Susy Frizzle, MD  Their chronic conditions include: hypertension, Type II DM, CKD, hyperlipidemia.  Office Visits:  12/06/2019 ([facility]) - cellulitis of chest wall, started keflex 540m tid  Consult Visit:  11/07/2019 ([facility]) - at risk foot care for thick nails, nails debrided, regular follow up scheduled  06/20/2019 ([facility]) - no med changes, recommended stationary bike for exercise, was considering increasing Jardiance if A1c does not improve.  Medications: Outpatient Encounter Medications as of 12/19/2019  Medication Sig   B-D UF III MINI PEN NEEDLES 31G X 5 MM MISC USE 4 TIMES PER DAY   Blood Glucose Monitoring Suppl (ONE TOUCH ULTRA 2) w/Device KIT Use to test blood sugar daily Dx code E11.65   cholecalciferol (VITAMIN D) 1000 UNITS tablet Take 1,000 Units by mouth daily.   docusate sodium (COLACE) 100 MG capsule Take 100 mg by mouth 2 (two) times daily.   donepezil (ARICEPT) 10 MG tablet Take 1 tablet (10 mg total) by mouth at bedtime.   empagliflozin (JARDIANCE) 10 MG TABS tablet Take 10 mg by mouth daily with breakfast.   ferrous gluconate (FERGON) 325 MG tablet Take 325 mg by mouth daily with breakfast.   fluticasone (FLONASE) 50 MCG/ACT nasal spray PLACE 2 SPRAYS INTO BOTH NOSTRILS DAILY.   glimepiride (AMARYL) 4 MG tablet TAKE 1 TABLET BY MOUTH DAILY BEFORE SUPPER   glucose blood (ONETOUCH VERIO) test strip Use as instructed to check blood sugar three times a day. Dx code E11.65   insulin degludec (TRESIBA FLEXTOUCH) 100 UNIT/ML SOPN FlexTouch Pen Inject 16 Units into the skin daily.   Insulin Pen Needle (B-D UF III MINI PEN NEEDLES) 31G X 5 MM MISC USE 4 TIMES PER DAY   Lancet Devices (ONE TOUCH DELICA  LANCING DEV) MISC Use to check blood sugar three times daily.   memantine (NAMENDA) 5 MG tablet Take 1 tablet (5 mg total) by mouth 2 (two) times daily.   metFORMIN (GLUCOPHAGE) 1000 MG tablet TAKE 1 TABLET IN THE MORNING & 1/2 TABLET AT DINNERTIME   mometasone (ELOCON) 0.1 % ointment Apply topically daily.   omeprazole (PRILOSEC) 20 MG capsule Take 20 mg by mouth daily.     OneTouch Delica Lancets 336IMISC 1 each by Does not apply route in the morning, at noon, and at bedtime. Use to check blood sugar three times daily. DX:E11.65   simvastatin (ZOCOR) 80 MG tablet Take 40 mg by mouth at bedtime.   azelastine (ASTELIN) 0.1 % nasal spray Place 1 spray into both nostrils 2 (two) times daily. Use in each nostril as directed (Patient not taking: Reported on 12/19/2019)   cephALEXin (KEFLEX) 500 MG capsule Take 1 capsule (500 mg total) by mouth 3 (three) times daily. (Patient not taking: Reported on 12/19/2019)   clotrimazole-betamethasone (LOTRISONE) cream Apply topically daily. (Patient not taking: Reported on 12/19/2019)   glimepiride (AMARYL) 2 MG tablet Take 1 tablet (220mtotal) by mouth once daily in the morning. (Patient not taking: Reported on 12/19/2019)   Facility-Administered Encounter Medications as of 12/19/2019  Medication   cyanocobalamin ((VITAMIN B-12)) injection 1,000 mcg     Current Diagnosis/Assessment:  Goals Addressed             This Visit's Progress  Pharmacy Care Plan:       CARE PLAN ENTRY  Current Barriers:  Chronic Disease Management support, education, and care coordination needs related to Hypertension, Hyperlipidemia, and Diabetes   Hypertension Pharmacist Clinical Goal(s): Over the next 180 days, patient will work with PharmD and providers to maintain BP goal <130/80 Current regimen:  No current medications for blood pressure Interventions: Comprehensive medication review Patient self care activities - Over the next 180 days, patient will: Check BP  daily, document, and provide at future appointments Ensure daily salt intake < 2300 mg/day  Hyperlipidemia Pharmacist Clinical Goal(s): Over the next 180 days, patient will work with PharmD and providers to maintain LDL goal < 100 Current regimen:  Simvastatin 28m daily Interventions: Comprehensive medication review Counseled on benefits of statin drugs Patient self care activities - Over the next 180 days, patient will: Continue to take medication as directed Contact PharmD or PCP with any medication related concerns  Diabetes Pharmacist Clinical Goal(s): Over the next 180 days, patient will work with PharmD and providers to achieve A1c goal <7% Current regimen:  Glimepiride 266mtwice daily, Tresiba 100u/ml 16 units in the morning Metformin 100078mvery morning and 500m32m dinner, Jardiance 10mg12mly Interventions: Comprehensive medication review Reviewed monitoring logs Patient self care activities - Over the next 180 days, patient will: Check blood sugar once daily, document, and provide at future appointments Report episodes of hypoglycemia to PharmD or PCP.   Initial goal documentation         Diabetes   Recent Relevant Labs: Lab Results  Component Value Date/Time   HGBA1C 9.7 (H) 09/14/2019 08:11 AM   HGBA1C 7.6 (A) 06/20/2019 10:10 AM   HGBA1C 8.1 (A) 03/21/2019 02:58 PM   HGBA1C 7.3 (H) 09/05/2016 10:11 AM   MICROALBUR 28.4 03/01/2019 12:00 AM   MICROALBUR 6.0 (H) 06/09/2018 01:28 PM     Checking BG: Daily  Recent FBG Readings: 120-300  Patient is currently uncontrolled on the following medications: glimepiride 2mg a67mpm, Tresiba 100 units/ml 16u daily, metformin 1000 one tab qam and one-half tab at dinner time, Jardiance 10mg d44m   Last diabetic Foot exam:  Lab Results  Component Value Date/Time   HMDIABEYEEXA No Retinopathy 02/24/2018 12:00 AM    Last diabetic Eye exam:  Lab Results  Component Value Date/Time   HMDIABFOOTEX normal  04/18/2019 12:00 AM     Wide range of AM blood sugars before eating.  Patients son did not have log present but recalled above range from memory.  On their log they have listed what was eaten the night before and whether it was checked fasting or not.  Reported high blood sugars have a great deal to do with what was eaten the night before.  Most FBG are < 200 per Jeff. AMerry Proudreported no episodes of hypoglycemia which has been a concern for patient previously.  Plan  Continue current medications.  Continue to check blood sugar as they are.  Contact PharmD or PCP with consistent elevations. Hypertension    Office blood pressures are  BP Readings from Last 3 Encounters:  12/05/19 120/62  06/20/19 (!) 122/56  03/31/19 120/68    Patient has failed these meds in the past: trandolapril (low BP)  Patient checks BP at home infrequently  Patient home BP readings are ranging: none noted  Patient is currently controlled on the following medications: no medications currently  Currently not on BP meds because he was experiencing hypotension.  No current concerns with BP  as office BP's have been great as of late.  Plan  No medications needed at this time.     Hyperlipidemia   Lipid Panel     Component Value Date/Time   CHOL 175 09/14/2019 0811   TRIG 57.0 09/14/2019 0811   HDL 55.00 09/14/2019 0811   CHOLHDL 3 09/14/2019 0811   VLDL 11.4 09/14/2019 0811   LDLCALC 109 (H) 09/14/2019 0811   LDLCALC 43 04/09/2017 1024   LDLDIRECT 89.0 06/20/2019 1026     The ASCVD Risk score (Goff DC Jr., et al., 2013) failed to calculate for the following reasons:   The 2013 ASCVD risk score is only valid for ages 38 to 23   Patient has failed these meds in past: none noted Patient is currently controlled on the following medications: simvastatin 21m  One-half tablet daily  Currently taking with no concerns.  Discussed the benefits of statins on cardiovascular prevention.  Plan  Continue  current medications  CKD   Kidney Function Lab Results  Component Value Date/Time   CREATININE 1.37 09/14/2019 08:11 AM   CREATININE 1.22 06/20/2019 10:26 AM   CREATININE 1.19 (H) 04/09/2017 10:24 AM   CREATININE 1.18 (H) 09/05/2016 10:11 AM   GFR 49.00 (L) 09/14/2019 08:11 AM   GFRNONAA 55 (L) 04/09/2017 10:24 AM   GFRAA 64 04/09/2017 10:24 AM   All medications currently safe based on kidney function.  Plan  Continue current medications   Medication Management   Miscellaneous medications: memantine 541mdaily, donepezil 1081maily,  OTC's: vitamin d, Colace prn Patient currently uses [facility].  Phone #  [phone] reports using pill box method organized by his son to promote adherence. Patient denies missed doses of medications.     ChrBeverly MilchharmD Clinical Pharmacist [phone] collaborated with the care management provider regarding care management and care coordination activities outlined in this encounter and have reviewed this encounter including documentation in the note and care plan. I am certifying that I agree with the content of this note and encounter as supervising physician.

## 2019-12-19 ENCOUNTER — Ambulatory Visit: Payer: Medicare Other | Admitting: Pharmacist

## 2019-12-19 ENCOUNTER — Other Ambulatory Visit: Payer: Self-pay

## 2019-12-19 ENCOUNTER — Other Ambulatory Visit: Payer: Self-pay | Admitting: Family Medicine

## 2019-12-19 DIAGNOSIS — Z794 Long term (current) use of insulin: Secondary | ICD-10-CM

## 2019-12-19 DIAGNOSIS — I10 Essential (primary) hypertension: Secondary | ICD-10-CM

## 2019-12-19 DIAGNOSIS — E7800 Pure hypercholesterolemia, unspecified: Secondary | ICD-10-CM

## 2019-12-19 NOTE — Patient Instructions (Addendum)
Thank you for meeting with me today!  I look forward to working with you to help you meet all of your healthcare goals and answer any questions you may have.  Feel free to contact me anytime!   Visit Information  Goals Addressed            This Visit's Progress   . Pharmacy Care Plan:       CARE PLAN ENTRY  Current Barriers:  . Chronic Disease Management support, education, and care coordination needs related to Hypertension, Hyperlipidemia, and Diabetes   Hypertension . Pharmacist Clinical Goal(s): o Over the next 180 days, patient will work with PharmD and providers to maintain BP goal <130/80 . Current regimen:  o No current medications for blood pressure . Interventions: o Comprehensive medication review . Patient self care activities - Over the next 180 days, patient will: o Check BP daily, document, and provide at future appointments o Ensure daily salt intake < 2300 mg/day  Hyperlipidemia . Pharmacist Clinical Goal(s): o Over the next 180 days, patient will work with PharmD and providers to maintain LDL goal < 100 . Current regimen:  o Simvastatin 40mg  daily . Interventions: o Comprehensive medication review o Counseled on benefits of statin drugs . Patient self care activities - Over the next 180 days, patient will: o Continue to take medication as directed o Contact PharmD or PCP with any medication related concerns  Diabetes . Pharmacist Clinical Goal(s): o Over the next 180 days, patient will work with PharmD and providers to achieve A1c goal <7% . Current regimen:  o Glimepiride 2mg  twice daily, Tresiba 100u/ml 16 units in the morning o Metformin 1000mg  every morning and 500mg  at dinner, Jardiance 10mg  daily . Interventions: o Comprehensive medication review o Reviewed monitoring logs . Patient self care activities - Over the next 180 days, patient will: o Check blood sugar once daily, document, and provide at future appointments o Report episodes of  hypoglycemia to PharmD or PCP.   Initial goal documentation        [name] was given information about Chronic Care Management services today including:  1. CCM service includes personalized support from designated clinical staff supervised by his physician, including individualized plan of care and coordination with other care providers 2. 24/7 contact phone numbers for assistance for urgent and routine care needs. 3. Standard insurance, coinsurance, copays and deductibles apply for chronic care management only during months in which we provide at least 20 minutes of these services. Most insurances cover these services at 100%, however patients may be responsible for any copay, coinsurance and/or deductible if applicable. This service may help you avoid the need for more expensive face-to-face services. 4. Only one practitioner may furnish and bill the service in a calendar month. 5. The patient may stop CCM services at any time (effective at the end of the month) by phone call to the office staff.  [name] and verbal consent obtained.   The patient verbalized understanding of instructions provided today and agreed to receive a mailed copy of patient instruction and/or educational materials. Telephone follow up appointment with pharmacy team member scheduled for:  06/26/2020@ 12:30 PM  Beverly Milch, PharmD Clinical Pharmacist [facility] [phone]   Diabetes Mellitus and Nutrition, Adult When you have diabetes (diabetes mellitus), it is very important to have healthy eating habits because your blood sugar (glucose) levels are greatly affected by what you eat and drink. Eating healthy foods in the  appropriate amounts, at about the same times every day, can help you:  Control your blood glucose.  Lower your risk of heart disease.  Improve your blood pressure.  Reach or maintain a healthy weight. Every person with diabetes is  different, and each person has different needs for a meal plan. Your health care provider may recommend that you work with a diet and nutrition specialist (dietitian) to make a meal plan that is best for you. Your meal plan may vary depending on factors such as:  The calories you need.  The medicines you take.  Your weight.  Your blood glucose, blood pressure, and cholesterol levels.  Your activity level.  Other health conditions you have, such as heart or kidney disease. How do carbohydrates affect me? Carbohydrates, also called carbs, affect your blood glucose level more than any other type of food. Eating carbs naturally raises the amount of glucose in your blood. Carb counting is a method for keeping track of how many carbs you eat. Counting carbs is important to keep your blood glucose at a healthy level, especially if you use insulin or take certain oral diabetes medicines. It is important to know how many carbs you can safely have in each meal. This is different for every person. Your dietitian can help you calculate how many carbs you should have at each meal and for each snack. Foods that contain carbs include:  Bread, cereal, rice, pasta, and crackers.  Potatoes and corn.  Peas, beans, and lentils.  Milk and yogurt.  Fruit and juice.  Desserts, such as cakes, cookies, ice cream, and candy. How does alcohol affect me? Alcohol can cause a sudden decrease in blood glucose (hypoglycemia), especially if you use insulin or take certain oral diabetes medicines. Hypoglycemia can be a life-threatening condition. Symptoms of hypoglycemia (sleepiness, dizziness, and confusion) are similar to symptoms of having too much alcohol. If your health care provider says that alcohol is safe for you, follow these guidelines:  Limit alcohol intake to no more than 1 drink per day for nonpregnant women and 2 drinks per day for men. One drink equals 12 oz of beer, 5 oz of wine, or 1 oz of hard  liquor.  Do not drink on an empty stomach.  Keep yourself hydrated with water, diet soda, or unsweetened iced tea.  Keep in mind that regular soda, juice, and other mixers may contain a lot of sugar and must be counted as carbs. What are tips for following this plan?  Reading food labels  Start by checking the serving size on the "Nutrition Facts" label of packaged foods and drinks. The amount of calories, carbs, fats, and other nutrients listed on the label is based on one serving of the item. Many items contain more than one serving per package.  Check the total grams (g) of carbs in one serving. You can calculate the number of servings of carbs in one serving by dividing the total carbs by 15. For example, if a food has 30 g of total carbs, it would be equal to 2 servings of carbs.  Check the number of grams (g) of saturated and trans fats in one serving. Choose foods that have low or no amount of these fats.  Check the number of milligrams (mg) of salt (sodium) in one serving. Most people should limit total sodium intake to less than 2,300 mg per day.  Always check the nutrition information of foods labeled as "low-fat" or "nonfat". These foods may be  higher in added sugar or refined carbs and should be avoided.  Talk to your dietitian to identify your daily goals for nutrients listed on the label. Shopping  Avoid buying canned, premade, or processed foods. These foods tend to be high in fat, sodium, and added sugar.  Shop around the outside edge of the grocery store. This includes fresh fruits and vegetables, bulk grains, fresh meats, and fresh dairy. Cooking  Use low-heat cooking methods, such as baking, instead of high-heat cooking methods like deep frying.  Cook using healthy oils, such as olive, canola, or sunflower oil.  Avoid cooking with butter, cream, or high-fat meats. Meal planning  Eat meals and snacks regularly, preferably at the same times every day. Avoid going  long periods of time without eating.  Eat foods high in fiber, such as fresh fruits, vegetables, beans, and whole grains. Talk to your dietitian about how many servings of carbs you can eat at each meal.  Eat 4-6 ounces (oz) of lean protein each day, such as lean meat, chicken, fish, eggs, or tofu. One oz of lean protein is equal to: ? 1 oz of meat, chicken, or fish. ? 1 egg. ?  cup of tofu.  Eat some foods each day that contain healthy fats, such as avocado, nuts, seeds, and fish. Lifestyle  Check your blood glucose regularly.  Exercise regularly as told by your health care provider. This may include: ? 150 minutes of moderate-intensity or vigorous-intensity exercise each week. This could be brisk walking, biking, or water aerobics. ? Stretching and doing strength exercises, such as yoga or weightlifting, at least 2 times a week.  Take medicines as told by your health care provider.  Do not use any products that contain nicotine or tobacco, such as cigarettes and e-cigarettes. If you need help quitting, ask your health care provider.  Work with a Social worker or diabetes educator to identify strategies to manage stress and any emotional and social challenges. Questions to ask a health care provider  Do I need to meet with a diabetes educator?  Do I need to meet with a dietitian?  What number can I call if I have questions?  When are the best times to check my blood glucose? Where to find more information:  American Diabetes Association: diabetes.org  Academy of Nutrition and Dietetics: www.eatright.[employer] of Diabetes and Digestive and Kidney Diseases (NIH): [employer] Summary  A healthy meal plan will help you control your blood glucose and maintain a healthy lifestyle.  Working with a diet and nutrition specialist (dietitian) can help you make a meal plan that is best for you.  Keep in mind that carbohydrates (carbs) and alcohol have immediate  effects on your blood glucose levels. It is important to count carbs and to use alcohol carefully. This information is not intended to replace advice given to you by your health care provider. Make sure you discuss any questions you have with your health care provider. Document Revised: 06/26/2017 Document Reviewed: 08/18/2016 Elsevier Patient Education  2020 [employer].

## 2020-02-03 ENCOUNTER — Other Ambulatory Visit: Payer: Self-pay | Admitting: Family Medicine

## 2020-02-06 ENCOUNTER — Other Ambulatory Visit: Payer: Self-pay

## 2020-02-06 ENCOUNTER — Encounter: Payer: Self-pay | Admitting: Podiatry

## 2020-02-06 ENCOUNTER — Ambulatory Visit (INDEPENDENT_AMBULATORY_CARE_PROVIDER_SITE_OTHER): Payer: Medicare Other | Admitting: Podiatry

## 2020-02-06 DIAGNOSIS — M79675 Pain in left toe(s): Secondary | ICD-10-CM | POA: Diagnosis not present

## 2020-02-06 DIAGNOSIS — E119 Type 2 diabetes mellitus without complications: Secondary | ICD-10-CM | POA: Diagnosis not present

## 2020-02-06 DIAGNOSIS — B351 Tinea unguium: Secondary | ICD-10-CM

## 2020-02-06 DIAGNOSIS — M79674 Pain in right toe(s): Secondary | ICD-10-CM

## 2020-02-06 DIAGNOSIS — L989 Disorder of the skin and subcutaneous tissue, unspecified: Secondary | ICD-10-CM

## 2020-02-06 NOTE — Progress Notes (Signed)
This patient returns to my office for at risk foot care.  This patient requires this care by a professional since this patient will be at risk due to having diabetes mellitus and chronic kidney disease.  This patient is unable to cut nails himself since the patient cannot reach his nails.These nails are painful walking and wearing shoes.  This patient presents for at risk foot care today.  General Appearance  Alert, conversant and in no acute stress.  Vascular  Dorsalis pedis and posterior tibial  pulses are palpable  bilaterally.  Capillary return is within normal limits  bilaterally. Temperature is within normal limits  bilaterally.  Neurologic  Senn-Weinstein monofilament wire test within normal limits  bilaterally. Muscle power within normal limits bilaterally.  Nails Thick disfigured discolored nails with subungual debris  from hallux to fifth toes right foot.  Debridement of nails 3-5 left foot . No evidence of bacterial infection or drainage bilaterally.  Orthopedic  No limitations of motion  feet .  No crepitus or effusions noted.  No bony pathology or digital deformities noted.  Skin  normotropic skin with no porokeratosis noted bilaterally.  No signs of infections or ulcers noted.   Asymptomatic callus sub hallux left foot.  Onychomycosis  Pain in right toes  Pain in left toes  Consent was obtained for treatment procedures.   Mechanical debridement of nails bilaterally performed with a nail nipper.  Filed with dremel without incident.    Return office visit   3 months                   Told patient to return for periodic foot care and evaluation due to potential at risk complications.   Shaquasha Gerstel DPM  

## 2020-02-21 ENCOUNTER — Telehealth: Payer: Self-pay | Admitting: Endocrinology

## 2020-02-21 NOTE — Telephone Encounter (Signed)
Lennette Bihari with [facility] ph# [phone] called re: [facility] received the documents for patient's glucose monitor, however, they did not receive the following: Recent medical notes and most recent medication list. Lennette Bihari requests the above missing information be faxed to fax# [phone] asap-patient has been waiting for his new glucose monitor-per Lennette Bihari.

## 2020-02-22 NOTE — Telephone Encounter (Signed)
Recent office visit note will be faxed. Med list is included in office visit notes.

## 2020-03-07 ENCOUNTER — Other Ambulatory Visit: Payer: Self-pay | Admitting: Endocrinology

## 2020-03-08 NOTE — Telephone Encounter (Signed)
Outpatient Medication Detail   Disp Refills Start End   metFORMIN (GLUCOPHAGE) 1000 MG tablet 135 tablet 2 03/08/2020    Sig: TAKE 1 TABLET IN THE MORNING & 1/2 TABLET AT DINNERTIME   Sent to pharmacy as: metFORMIN (GLUCOPHAGE) 1000 MG tablet   E-Prescribing Status: Receipt confirmed by pharmacy (03/08/2020 3:00 PM EDT)    Medication Detail   Disp Refills Start End   insulin degludec (TRESIBA FLEXTOUCH) 100 UNIT/ML FlexTouch Pen 6 mL 0 03/08/2020    Sig - Route: Inject 0.2 mLs (20 Units total) into the skin daily. - Subcutaneous   Sent to pharmacy as: insulin degludec (TRESIBA FLEXTOUCH) 100 UNIT/ML FlexTouch Pen   E-Prescribing Status: Receipt confirmed by pharmacy (03/08/2020 3:00 PM EDT)

## 2020-03-15 ENCOUNTER — Telehealth: Payer: Self-pay | Admitting: Pharmacist

## 2020-03-15 NOTE — Progress Notes (Signed)
Chronic Care Management Pharmacy Assistant   Name: [name]  MRN: [id number] DOB: 1929/04/02  Reason for Encounter: Disease State  Patient Questions:  1.  Have you seen any other providers since your last visit? Yes, 02/06/20- Gardiner Barefoot, DPM (Podiatry)  2.  Any changes in your medicines or health? No    PCP : Susy Frizzle, MD  Allergies:   Allergies  Allergen Reactions  . Avodart [Dutasteride] Swelling  . Ibuprofen Nausea And Vomiting  . Morphine Nausea And Vomiting    Medications: Outpatient Encounter Medications as of 03/15/2020  Medication Sig  . azelastine (ASTELIN) 0.1 % nasal spray Place 1 spray into both nostrils 2 (two) times daily. Use in each nostril as directed (Patient not taking: Reported on 12/19/2019)  . B-D UF III MINI PEN NEEDLES 31G X 5 MM MISC USE 4 TIMES PER DAY  . Blood Glucose Monitoring Suppl (ONE TOUCH ULTRA 2) w/Device KIT Use to test blood sugar daily Dx code E11.65  . cephALEXin (KEFLEX) 500 MG capsule Take 1 capsule (500 mg total) by mouth 3 (three) times daily. (Patient not taking: Reported on 12/19/2019)  . cholecalciferol (VITAMIN D) 1000 UNITS tablet Take 1,000 Units by mouth daily.  . clotrimazole-betamethasone (LOTRISONE) cream Apply topically daily. (Patient not taking: Reported on 12/19/2019)  . docusate sodium (COLACE) 100 MG capsule Take 100 mg by mouth 2 (two) times daily.  Marland Kitchen donepezil (ARICEPT) 10 MG tablet TAKE 1 TABLET BY MOUTH EVERYDAY AT BEDTIME  . empagliflozin (JARDIANCE) 10 MG TABS tablet Take 10 mg by mouth daily with breakfast.  . ferrous gluconate (FERGON) 325 MG tablet Take 325 mg by mouth daily with breakfast.  . fluticasone (FLONASE) 50 MCG/ACT nasal spray PLACE 2 SPRAYS INTO BOTH NOSTRILS DAILY.  Marland Kitchen glimepiride (AMARYL) 2 MG tablet Take 1 tablet (79m total) by mouth once daily in the morning. (Patient not taking: Reported on 12/19/2019)  . glimepiride (AMARYL) 4 MG tablet TAKE 1 TABLET BY MOUTH DAILY BEFORE  SUPPER  . glucose blood (ONETOUCH VERIO) test strip Use as instructed to check blood sugar three times a day. Dx code E11.65  . insulin degludec (TRESIBA FLEXTOUCH) 100 UNIT/ML FlexTouch Pen Inject 0.2 mLs (20 Units total) into the skin daily.  . Insulin Pen Needle (B-D UF III MINI PEN NEEDLES) 31G X 5 MM MISC USE 4 TIMES PER DAY  . Lancet Devices (ONE TOUCH DELICA LANCING DEV) MISC Use to check blood sugar three times daily.  . memantine (NAMENDA) 5 MG tablet Take 1 tablet (5 mg total) by mouth 2 (two) times daily.  . metFORMIN (GLUCOPHAGE) 1000 MG tablet TAKE 1 TABLET IN THE MORNING & 1/2 TABLET AT DINNERTIME  . mometasone (ELOCON) 0.1 % ointment Apply topically daily.  .Marland Kitchenomeprazole (PRILOSEC) 20 MG capsule Take 20 mg by mouth daily.    .Glory RosebushDelica Lancets [id number] 1 each by Does not apply route in the morning, at noon, and at bedtime. Use to check blood sugar three times daily. DX:E11.65  . simvastatin (ZOCOR) 80 MG tablet Take 40 mg by mouth at bedtime.   Facility-Administered Encounter Medications as of 03/15/2020  Medication  . cyanocobalamin ((VITAMIN B-12)) injection 1,000 mcg    Current Diagnosis: Patient Active Problem List   Diagnosis Date Noted  . Pain due to onychomycosis of toenails of both feet 01/24/2019  . Diabetes mellitus without complication ([facility] 048/27/0786 . Type 2 diabetes mellitus without complication ([facility] 075/44/9201 . PAC (premature  atrial contraction) 05/21/2015  . S/P TURP (status post transurethral resection of prostate) 11/16/2013  . UTI (urinary tract infection) 11/16/2013  . Aortic sclerosis 11/16/2013  . Chronic kidney disease, stage II (mild) 08/02/2013  . Hypertension   . Hyperlipidemia   . B12 deficiency   . Type II or unspecified type diabetes mellitus without mention of complication, uncontrolled 03/01/2013  . Pure hypercholesterolemia 03/01/2013  . BPH (benign prostatic hypertrophy) 10/21/2010  . Rhinitis 10/21/2010  . History of kidney  stones 10/21/2010  . UNSPECIFIED ANEMIA 03/26/2009  . NONSPECIFIC ABNORMAL FINDING IN STOOL CONTENTS 03/26/2009  . PERSONAL HISTORY OF COLONIC POLYPS 03/26/2009    Goals Addressed   None     Follow-Up:  Pharmacist Review   Recent Relevant Labs: Lab Results  Component Value Date/Time   HGBA1C 9.7 (H) 09/14/2019 08:11 AM   HGBA1C 7.6 (A) 06/20/2019 10:10 AM   HGBA1C 8.1 (A) 03/21/2019 02:58 PM   HGBA1C 7.3 (H) 09/05/2016 10:11 AM   MICROALBUR 28.4 03/01/2019 12:00 AM   MICROALBUR 6.0 (H) 06/09/2018 01:28 PM    Kidney Function Lab Results  Component Value Date/Time   CREATININE 1.37 09/14/2019 08:11 AM   CREATININE 1.22 06/20/2019 10:26 AM   CREATININE 1.19 (H) 04/09/2017 10:24 AM   CREATININE 1.18 (H) 09/05/2016 10:11 AM   GFR 49.00 (L) 09/14/2019 08:11 AM   GFRNONAA 55 (L) 04/09/2017 10:24 AM   GFRAA 64 04/09/2017 10:24 AM    . Current antihyperglycemic regimen:   Metformin 1000 MG, Glimepiride 2 MG, Jardiance 10 MG, Tresiba flextouch 100 Unit/mL . What recent interventions/DTP's have been made to improve glycemic control:  o None . Have there been any recent hospitalizations or ED visits since last visit with CPP? No . Patient denies hypoglycemic symptoms, including None . Patient denies hyperglycemic symptoms, including none . How often are you checking your blood sugar? once daily . What are your blood sugars ranging? Patient's son didn't specify the ranges when asked but did state his dad is 84 yrs old and he's fine as long as his dad's blood sugars stay between 150-250 even though that is on the high side. Patient's son also stated he monitors his dad's symptoms daily. Patient has not shown any signs of hyperglycemia nor hypoglycemia. Patient's son did also state pt's sugars can run high sometimes in the morning but it varies depending on whether or not they check his sugar before or after breakfast. . During the week, how often does your blood glucose drop below 70?  Never . Are you checking your feet daily/regularly? Yes. Patient's son states he goes to see his foot doctor every 3 months. . Misc: Patient's son states another reason his sugars could be running high sometimes is because he doesn't allow (Dr. Dwyane Dee) to change the patient's medications. Patient's son would have to call the ambulance often after changing the patient's medications due to patient having various diabetic symptoms. Patient also eats 3 meals daily. Patient has a banana and oatmeal every morning, eats fruits and vegetables regularly and his dinner consists of a meat and 2 vegetables. Patient does drink water regularly along with unsweet tea. [place]. Patient's son encourages patient to use his cane but patient usually walks around without it.  Adherence Review: Is the patient currently on a STATIN medication? No Is the patient currently on ACE/ARB medication? No Does the patient have >5 day gap between last estimated fill dates? No  Yazmine  [place], [place] Pharmacist Assistant [phone]

## 2020-04-17 ENCOUNTER — Telehealth: Payer: Self-pay | Admitting: Endocrinology

## 2020-04-17 NOTE — Telephone Encounter (Signed)
[facility] called stating they received a fax from [place] stating they think was sent to them by mistake for this patient regarding a freestyle libre. This is not even one of their patient's - [name] states the fax says its a medical detailed order for medicare? She says they will fax [place] what we sent them so we can get it to the right place.

## 2020-04-18 ENCOUNTER — Telehealth: Payer: Self-pay

## 2020-04-18 NOTE — Telephone Encounter (Signed)
Spoke with Molli Barrows at [place] record department. Requesting that the paperwork be faxed to [phone] and [phone].  Records will be faxed again at 11:31am

## 2020-04-18 NOTE — Telephone Encounter (Signed)
We already faxed and sent medicare

## 2020-04-18 NOTE — Telephone Encounter (Deleted)
Error

## 2020-04-23 ENCOUNTER — Other Ambulatory Visit: Payer: Self-pay

## 2020-04-23 NOTE — Telephone Encounter (Signed)
Records faxed and order given to Dr Dwyane Dee to sign.

## 2020-04-23 NOTE — Telephone Encounter (Signed)
[facility] called stating Need a copy of the SIGNED prescription for [facility] glucometer as well as the LAST 3 OFFICE NOTES . FAX [phone] . Ronalee Belts also provided another number [phone].

## 2020-04-24 NOTE — Telephone Encounter (Signed)
Most recent office note has been faxed to [phone].

## 2020-04-24 NOTE — Telephone Encounter (Signed)
Fax # [phone] Oletta Lamas called and requested that we send most recent office notes

## 2020-04-30 MED ORDER — ONETOUCH ULTRA 2 W/DEVICE KIT: PACK | 3 refills | Status: DC

## 2020-05-04 ENCOUNTER — Telehealth: Payer: Self-pay | Admitting: *Deleted

## 2020-05-04 NOTE — Telephone Encounter (Signed)
LVM--pt son need follow up appointment to see Dr. Dwyane Dee

## 2020-05-14 ENCOUNTER — Ambulatory Visit: Payer: Medicare Other | Admitting: Podiatry

## 2020-05-17 ENCOUNTER — Telehealth: Payer: Self-pay | Admitting: *Deleted

## 2020-05-17 ENCOUNTER — Other Ambulatory Visit: Payer: Self-pay | Admitting: Endocrinology

## 2020-05-17 DIAGNOSIS — E1165 Type 2 diabetes mellitus with hyperglycemia: Secondary | ICD-10-CM

## 2020-05-17 DIAGNOSIS — I10 Essential (primary) hypertension: Secondary | ICD-10-CM | POA: Diagnosis not present

## 2020-05-17 DIAGNOSIS — E7800 Pure hypercholesterolemia, unspecified: Secondary | ICD-10-CM | POA: Diagnosis not present

## 2020-05-17 DIAGNOSIS — E118 Type 2 diabetes mellitus with unspecified complications: Secondary | ICD-10-CM | POA: Diagnosis not present

## 2020-05-17 DIAGNOSIS — Z794 Long term (current) use of insulin: Secondary | ICD-10-CM

## 2020-05-17 DIAGNOSIS — E538 Deficiency of other specified B group vitamins: Secondary | ICD-10-CM

## 2020-05-17 NOTE — Telephone Encounter (Signed)
Appears to have an appointment now, labs ordered

## 2020-05-18 ENCOUNTER — Telehealth: Payer: Self-pay | Admitting: Pharmacist

## 2020-05-18 NOTE — Progress Notes (Signed)
Chronic Care Management Pharmacy Assistant   Name: [name]  MRN: [id number] DOB: 1928/11/17  Reason for Encounter: Disease State  Patient Questions:  1.  Have you seen any other providers since your last visit? Yes  2.  Any changes in your medicines or health? No    PCP : Susy Frizzle, MD   Their chronic conditions include: hypertension, Type II DM, CKD, hyperlipidemia  Office Visit: None since their last CCM visit with the clinical pharmacist on 12-19-2019.  Consults: 05-21-2020 (podiatry) Patient returns for at risk foot care. Patient has Onychomycosis  pain in left and right toes. Mechanical debridement of nails bilaterally performed with a nail nipper.   Allergies:   Allergies  Allergen Reactions  . Avodart [Dutasteride] Swelling  . Ibuprofen Nausea And Vomiting  . Morphine Nausea And Vomiting    Medications: Outpatient Encounter Medications as of 05/18/2020  Medication Sig  . azelastine (ASTELIN) 0.1 % nasal spray Place 1 spray into both nostrils 2 (two) times daily. Use in each nostril as directed (Patient not taking: Reported on 12/19/2019)  . B-D UF III MINI PEN NEEDLES 31G X 5 MM MISC USE 4 TIMES PER DAY  . Blood Glucose Monitoring Suppl (ONE TOUCH ULTRA 2) w/Device KIT Use to test blood sugar daily Dx code E11.65  . cephALEXin (KEFLEX) 500 MG capsule Take 1 capsule (500 mg total) by mouth 3 (three) times daily. (Patient not taking: Reported on 12/19/2019)  . cholecalciferol (VITAMIN D) 1000 UNITS tablet Take 1,000 Units by mouth daily.  . clotrimazole-betamethasone (LOTRISONE) cream Apply topically daily. (Patient not taking: Reported on 12/19/2019)  . docusate sodium (COLACE) 100 MG capsule Take 100 mg by mouth 2 (two) times daily.  Marland Kitchen donepezil (ARICEPT) 10 MG tablet TAKE 1 TABLET BY MOUTH EVERYDAY AT BEDTIME  . empagliflozin (JARDIANCE) 10 MG TABS tablet Take 10 mg by mouth daily with breakfast.  . ferrous gluconate (FERGON) 325 MG tablet Take 325  mg by mouth daily with breakfast.  . fluticasone (FLONASE) 50 MCG/ACT nasal spray PLACE 2 SPRAYS INTO BOTH NOSTRILS DAILY.  Marland Kitchen glimepiride (AMARYL) 2 MG tablet Take 1 tablet (73m total) by mouth once daily in the morning. (Patient not taking: Reported on 12/19/2019)  . glimepiride (AMARYL) 4 MG tablet TAKE 1 TABLET BY MOUTH DAILY BEFORE SUPPER  . glucose blood (ONETOUCH VERIO) test strip Use as instructed to check blood sugar three times a day. Dx code E11.65  . insulin degludec (TRESIBA FLEXTOUCH) 100 UNIT/ML FlexTouch Pen Inject 0.2 mLs (20 Units total) into the skin daily.  . Insulin Pen Needle (B-D UF III MINI PEN NEEDLES) 31G X 5 MM MISC USE 4 TIMES PER DAY  . Lancet Devices (ONE TOUCH DELICA LANCING DEV) MISC Use to check blood sugar three times daily.  . memantine (NAMENDA) 5 MG tablet Take 1 tablet (5 mg total) by mouth 2 (two) times daily.  . metFORMIN (GLUCOPHAGE) 1000 MG tablet TAKE 1 TABLET IN THE MORNING & 1/2 TABLET AT DINNERTIME  . mometasone (ELOCON) 0.1 % ointment Apply topically daily.  .Marland Kitchenomeprazole (PRILOSEC) 20 MG capsule Take 20 mg by mouth daily.    .Glory RosebushDelica Lancets [id number] 1 each by Does not apply route in the morning, at noon, and at bedtime. Use to check blood sugar three times daily. DX:E11.65  . simvastatin (ZOCOR) 80 MG tablet Take 40 mg by mouth at bedtime.   Facility-Administered Encounter Medications as of 05/18/2020  Medication  .  cyanocobalamin ((VITAMIN B-12)) injection 1,000 mcg    Current Diagnosis: Patient Active Problem List   Diagnosis Date Noted  . Pain due to onychomycosis of toenails of both feet 01/24/2019  . Diabetes mellitus without complication ([place]) 67/67/2094  . Type 2 diabetes mellitus without complication ([place]) 70/96/2836  . PAC (premature atrial contraction) 05/21/2015  . S/P TURP (status post transurethral resection of prostate) 11/16/2013  . UTI (urinary tract infection) 11/16/2013  . Aortic sclerosis 11/16/2013  . Chronic  kidney disease, stage II (mild) 08/02/2013  . Hypertension   . Hyperlipidemia   . B12 deficiency   . Type II or unspecified type diabetes mellitus without mention of complication, uncontrolled 03/01/2013  . Pure hypercholesterolemia 03/01/2013  . BPH (benign prostatic hypertrophy) 10/21/2010  . Rhinitis 10/21/2010  . History of kidney stones 10/21/2010  . UNSPECIFIED ANEMIA 03/26/2009  . NONSPECIFIC ABNORMAL FINDING IN STOOL CONTENTS 03/26/2009  . PERSONAL HISTORY OF COLONIC POLYPS 03/26/2009    Goals Addressed   None    Recent Relevant Labs: Lab Results  Component Value Date/Time   HGBA1C 9.7 (H) 09/14/2019 08:11 AM   HGBA1C 7.6 (A) 06/20/2019 10:10 AM   HGBA1C 8.1 (A) 03/21/2019 02:58 PM   HGBA1C 7.3 (H) 09/05/2016 10:11 AM   MICROALBUR 28.4 03/01/2019 12:00 AM   MICROALBUR 6.0 (H) 06/09/2018 01:28 PM    Kidney Function Lab Results  Component Value Date/Time   CREATININE 1.37 09/14/2019 08:11 AM   CREATININE 1.22 06/20/2019 10:26 AM   CREATININE 1.19 (H) 04/09/2017 10:24 AM   CREATININE 1.18 (H) 09/05/2016 10:11 AM   GFR 49.00 (L) 09/14/2019 08:11 AM   GFRNONAA 55 (L) 04/09/2017 10:24 AM   GFRAA 64 04/09/2017 10:24 AM   Patient's dementia has been worsening. Spoke with the patient's son [name] who is listed on his HIPAA. . Current antihyperglycemic regimen:  ? Glimepiride 29m twice daily ? Tresiba 100u/ml 16 units in the morning ? Metformin 10090mevery morning and 5003mt dinner ? Jardiance 76m55mily  . What recent interventions/DTPs have been made to improve glycemic control:  o none  . Have there been any recent hospitalizations or ED visits since last visit with CPP? No   . Patient denies hypoglycemic symptoms, including None   . Patient denies hyperglycemic symptoms, including none   . How often are you checking your blood sugar? once daily   . What are your blood sugars ranging?  o Fasting: ranges from 110-150 depending on what he ate the night  before  . During the week, how often does your blood glucose drop below 70? Yes but it was years ago. No recent occurrences.   . Are you checking your feet daily/regularly? Yes, there are no issues or concerns. Son did notice the left ankle has been slightly more swollen than the right.  During Disease State call, patient's son, [name] Cadetated he is [facility] he was advised the PCP needed to approve the prescription first. [facility] [place] he has not been able to. ToldDarci Needleould reach out to Dr. PickSamella Parrand inquire if this can be looked into. Reminded [name] Cadetthe next follow up visit on Tuesday November 30th at 12:30pm over the telephone.  Adherence Review: Is the patient currently on a STATIN medication? Yes simvastatin 80 MG tablet Is the patient currently on ACE/ARB medication? No Does the patient have >5 day gap between last estimated  fill dates? No CPP please confirm   Follow-Up:  Pharmacist Review   Ivey [place], CMA Clinical Pharmacist Assistant [phone] 

## 2020-05-21 ENCOUNTER — Encounter: Payer: Self-pay | Admitting: Podiatry

## 2020-05-21 ENCOUNTER — Ambulatory Visit (INDEPENDENT_AMBULATORY_CARE_PROVIDER_SITE_OTHER): Payer: Medicare Other | Admitting: Podiatry

## 2020-05-21 ENCOUNTER — Other Ambulatory Visit: Payer: Self-pay

## 2020-05-21 ENCOUNTER — Telehealth: Payer: Self-pay | Admitting: Endocrinology

## 2020-05-21 DIAGNOSIS — M79674 Pain in right toe(s): Secondary | ICD-10-CM

## 2020-05-21 DIAGNOSIS — B351 Tinea unguium: Secondary | ICD-10-CM

## 2020-05-21 DIAGNOSIS — M79675 Pain in left toe(s): Secondary | ICD-10-CM

## 2020-05-21 DIAGNOSIS — E119 Type 2 diabetes mellitus without complications: Secondary | ICD-10-CM

## 2020-05-21 LAB — HM DIABETES FOOT EXAM

## 2020-05-21 NOTE — Telephone Encounter (Signed)
I tried returning his call but there was no answer.  We have gotten this fax many times but I am unable to get in touch with the patients son to make an appointment to see Dr. Dwyane Dee

## 2020-05-21 NOTE — Progress Notes (Signed)
This patient returns to my office for at risk foot care.  This patient requires this care by a professional since this patient will be at risk due to having diabetes mellitus and chronic kidney disease.  This patient is unable to cut nails himself since the patient cannot reach his nails.These nails are painful walking and wearing shoes.  This patient presents for at risk foot care today.  General Appearance  Alert, conversant and in no acute stress.  Vascular  Dorsalis pedis and posterior tibial  pulses are palpable  bilaterally.  Capillary return is within normal limits  bilaterally. Temperature is within normal limits  bilaterally.  Neurologic  Senn-Weinstein monofilament wire test within normal limits  bilaterally. Muscle power within normal limits bilaterally.  Nails Thick disfigured discolored nails with subungual debris  from hallux to fifth toes right foot.  Debridement of nails 3-5 left foot . No evidence of bacterial infection or drainage bilaterally.  Orthopedic  No limitations of motion  feet .  No crepitus or effusions noted.  No bony pathology or digital deformities noted.  Skin  normotropic skin with no porokeratosis noted bilaterally.  No signs of infections or ulcers noted.   Asymptomatic callus sub hallux left foot.  Onychomycosis  Pain in right toes  Pain in left toes  Consent was obtained for treatment procedures.   Mechanical debridement of nails bilaterally performed with a nail nipper.  Filed with dremel without incident.    Return office visit   3 months                   Told patient to return for periodic foot care and evaluation due to potential at risk complications.   Gardiner Barefoot DPM

## 2020-05-21 NOTE — Telephone Encounter (Signed)
[facility] called stating he sent a fax for a continuous glucose moniter named [employer] and wanted to confirm that we got this.

## 2020-05-24 ENCOUNTER — Other Ambulatory Visit: Payer: Self-pay | Admitting: Family Medicine

## 2020-05-30 ENCOUNTER — Other Ambulatory Visit (INDEPENDENT_AMBULATORY_CARE_PROVIDER_SITE_OTHER): Payer: Medicare Other

## 2020-05-30 ENCOUNTER — Other Ambulatory Visit: Payer: Self-pay

## 2020-05-30 DIAGNOSIS — E7800 Pure hypercholesterolemia, unspecified: Secondary | ICD-10-CM | POA: Diagnosis not present

## 2020-05-30 DIAGNOSIS — Z794 Long term (current) use of insulin: Secondary | ICD-10-CM

## 2020-05-30 DIAGNOSIS — E1165 Type 2 diabetes mellitus with hyperglycemia: Secondary | ICD-10-CM

## 2020-05-30 DIAGNOSIS — E538 Deficiency of other specified B group vitamins: Secondary | ICD-10-CM

## 2020-05-30 LAB — LIPID PANEL
Cholesterol: 177 mg/dL (ref 0–200)
HDL: 49.9 mg/dL (ref >39.00)
LDL Cholesterol: 113 mg/dL — ABNORMAL HIGH (ref 0–99)
NonHDL: 127.43
Total CHOL/HDL Ratio: 4
Triglycerides: 72 mg/dL (ref 0.0–149.0)
VLDL: 14.4 mg/dL (ref 0.0–40.0)

## 2020-05-30 LAB — CBC
HCT: 41.4 % (ref 39.0–52.0)
Hemoglobin: 13.9 g/dL (ref 13.0–17.0)
MCHC: 33.5 g/dL (ref 30.0–36.0)
MCV: 97.7 fl (ref 78.0–100.0)
Platelets: 168 10*3/uL (ref 150.0–400.0)
RBC: 4.24 Mil/uL (ref 4.22–5.81)
RDW: 13.7 % (ref 11.5–15.5)
WBC: 6.9 10*3/uL (ref 4.0–10.5)

## 2020-05-30 LAB — COMPREHENSIVE METABOLIC PANEL
ALT: 16 U/L (ref 0–53)
AST: 16 U/L (ref 0–37)
Albumin: 4 g/dL (ref 3.5–5.2)
Alkaline Phosphatase: 101 U/L (ref 39–117)
BUN: 19 mg/dL (ref 6–23)
CO2: 30 mEq/L (ref 19–32)
Calcium: 9.1 mg/dL (ref 8.4–10.5)
Chloride: 104 mEq/L (ref 96–112)
Creatinine, Ser: 1.27 mg/dL (ref 0.40–1.50)
GFR: 50.24 mL/min — ABNORMAL LOW (ref >60.00)
Glucose, Bld: 151 mg/dL — ABNORMAL HIGH (ref 70–99)
Potassium: 4.1 mEq/L (ref 3.5–5.1)
Sodium: 140 mEq/L (ref 135–145)
Total Bilirubin: 0.7 mg/dL (ref 0.2–1.2)
Total Protein: 6.9 g/dL (ref 6.0–8.3)

## 2020-05-30 LAB — MICROALBUMIN / CREATININE URINE RATIO
Creatinine,U: 83 mg/dL
Microalb Creat Ratio: 9.5 mg/g (ref 0.0–30.0)
Microalb, Ur: 7.9 mg/dL — ABNORMAL HIGH (ref 0.0–1.9)

## 2020-05-30 LAB — HEMOGLOBIN A1C: Hgb A1c MFr Bld: 10.2 % — ABNORMAL HIGH (ref 4.6–6.5)

## 2020-05-30 LAB — VITAMIN B12: Vitamin B-12: 162 pg/mL — ABNORMAL LOW (ref 211–911)

## 2020-06-01 ENCOUNTER — Other Ambulatory Visit: Payer: Self-pay | Admitting: Endocrinology

## 2020-06-04 ENCOUNTER — Telehealth: Payer: Self-pay

## 2020-06-04 NOTE — Telephone Encounter (Signed)
I have told them mulitple times that we need to see the patient in the office before we can authorize that PA

## 2020-06-04 NOTE — Telephone Encounter (Signed)
New message    [place] checking on the status of  prior authorization voiced fax sent over several times.

## 2020-06-05 ENCOUNTER — Encounter: Payer: Self-pay | Admitting: Endocrinology

## 2020-06-05 ENCOUNTER — Other Ambulatory Visit: Payer: Self-pay

## 2020-06-05 ENCOUNTER — Other Ambulatory Visit: Payer: Self-pay | Admitting: *Deleted

## 2020-06-05 ENCOUNTER — Telehealth: Payer: Self-pay | Admitting: Endocrinology

## 2020-06-05 ENCOUNTER — Ambulatory Visit (INDEPENDENT_AMBULATORY_CARE_PROVIDER_SITE_OTHER): Payer: Medicare Other | Admitting: Endocrinology

## 2020-06-05 VITALS — BP 136/74 | HR 66 | Ht 69.0 in | Wt 202.4 lb

## 2020-06-05 DIAGNOSIS — Z794 Long term (current) use of insulin: Secondary | ICD-10-CM

## 2020-06-05 DIAGNOSIS — E7800 Pure hypercholesterolemia, unspecified: Secondary | ICD-10-CM | POA: Diagnosis not present

## 2020-06-05 DIAGNOSIS — E1165 Type 2 diabetes mellitus with hyperglycemia: Secondary | ICD-10-CM

## 2020-06-05 DIAGNOSIS — Z23 Encounter for immunization: Secondary | ICD-10-CM | POA: Diagnosis not present

## 2020-06-05 MED ORDER — EMPAGLIFLOZIN 25 MG PO TABS: 25.0000 mg | ORAL_TABLET | Freq: Every day | ORAL | 1 refills | Status: DC

## 2020-06-05 MED ORDER — REPAGLINIDE 1 MG PO TABS
1.0000 mg | ORAL_TABLET | Freq: Three times a day (TID) | ORAL | 1 refills | Status: DC
Start: 2020-06-05 — End: 2020-10-04

## 2020-06-05 MED ORDER — SIMVASTATIN 40 MG PO TABS
40.0000 mg | ORAL_TABLET | Freq: Every day | ORAL | 1 refills | Status: DC
Start: 2020-06-05 — End: 2020-10-04

## 2020-06-05 NOTE — Telephone Encounter (Signed)
Patient called to give Suanne Marker the Phone# for the [place] re[facility] RX. [facility] PH# [phone]

## 2020-06-05 NOTE — Patient Instructions (Signed)
STOP GLIMEPERIDE  START REPEGLANIDE AT THE start of each Meal  Jardiance do 2 of the 10mg  pills and then 25 mg   Restart Simvastatin

## 2020-06-05 NOTE — Progress Notes (Signed)
Patient ID: [name], male   DOB: 1929-05-05, 86 y.o.   MRN: [id number]   Reason for Appointment: Diabetes follow-up   History of Present Illness   Diagnosis: Type 2 DIABETES MELITUS, diagnoses 1972   He has had long-standing diabetes and has been on basal bolus insulin regimen after failure of oral hypoglycemic drugs over the last several years His blood sugars have been generally fairly well controlled but has a tendency to high postprandial readings Previously required only a small dose of 5 units of Levemir insulin  Taking Amaryl in addition to his insulin regimen  his blood sugars had improved his blood sugars during the daytime  He has taken Toujeo since 9/16 instead of twice a day Levemir  RECENT history:   Insulin regimen:  TRESIBA 16 units at breakfast  Oral hypoglycemic drugs: Metformin 1000 mg a.m. and 500 mg p.m., Jardiance 10 mg daily Amaryl 2 mg in a.m. and 2 mg p.m.   Current management, blood sugar patterns and problems identified  His A1c is relatively higher at 10.2   He is still having his blood sugar checked only in the mornings and some of the readings are higher if they are checked right after eating  He is frequently using his aide to help check his sugars  Also is not missing any doses of insulin apparently  He was told to increase her Jardiance to 20 mg on the last visit but he has not done so  He has not been as active except for some outside activities in his garden but not doing any formal exercise  Recently weight is about same  No hypoglycemia reported with taking Amaryl 2 mg twice daily  Usually eating 3 balanced meals a day and lunch is provided by [employer]  Also has a mixed meal in the evening that his son brings him   Side effects from medications: None          Proper timing of insulin in relation to meals: Yes.          Monitors blood glucose: <1 times a day.    Glucometer: One Prob[employer] .           Blood Glucose readings from  meter download  A.m. blood sugars range 125-369 median reading 200  Previously MORNING blood sugar range for the 30 days 91-403 with average 210   Meals: 3 meals per day. Breakfast at 8 am Lunch 12 noon Supper at 5-6 pm Drinks water mostly and no sweet drinks    Wt Readings from Last 3 Encounters:  06/05/20 202 lb 6.4 oz (91.8 kg)  12/05/19 201 lb (91.2 kg)  06/20/19 191 lb 3.2 oz (86.7 kg)   Lab Results  Component Value Date   HGBA1C 10.2 (H) 05/30/2020   HGBA1C 9.7 (H) 09/14/2019   HGBA1C 7.6 (A) 06/20/2019   Lab Results  Component Value Date   MICROALBUR 7.9 (H) 05/30/2020   LDLCALC 113 (H) 05/30/2020   CREATININE 1.27 05/30/2020     Allergies as of 06/05/2020      Reactions   Avodart [dutasteride] Swelling   Ibuprofen Nausea And Vomiting   Morphine Nausea And Vomiting      Medication List       Accurate as of June 05, 2020 11:18 AM. If you have any questions, ask your nurse or doctor.        STOP taking these medications   glimepiride 4 MG tablet Commonly  known as: AMARYL Stopped by: Elayne Snare, MD     TAKE these medications   atenolol 25 MG tablet Commonly known as: TENORMIN TAKE 1 TABLET BY MOUTH IN THE MORNING AND 1/2 TABLET AT BEDTIME   B-D UF III MINI PEN NEEDLES 31G X 5 MM Misc Generic drug: Insulin Pen Needle USE 4 TIMES PER DAY   B-D UF III MINI PEN NEEDLES 31G X 5 MM Misc Generic drug: Insulin Pen Needle USE 4 TIMES PER DAY   cholecalciferol 1000 units tablet Commonly known as: VITAMIN D Take 1,000 Units by mouth daily.   docusate sodium 100 MG capsule Commonly known as: COLACE Take 100 mg by mouth 2 (two) times daily.   donepezil 10 MG tablet Commonly known as: ARICEPT TAKE 1 TABLET BY MOUTH EVERYDAY AT BEDTIME   ferrous gluconate 325 MG tablet Commonly known as: FERGON Take 325 mg by mouth daily with breakfast.   fluticasone 50 MCG/ACT nasal spray Commonly known as: FLONASE PLACE 2 SPRAYS  INTO BOTH NOSTRILS DAILY.   Jardiance 10 MG Tabs tablet Generic drug: empagliflozin Take 10 mg by mouth daily with breakfast.   memantine 5 MG tablet Commonly known as: Namenda Take 1 tablet (5 mg total) by mouth 2 (two) times daily.   metFORMIN 1000 MG tablet Commonly known as: GLUCOPHAGE TAKE 1 TABLET IN THE MORNING & 1/2 TABLET AT DINNERTIME   mometasone 0.1 % ointment Commonly known as: Elocon Apply topically daily.   omeprazole 20 MG capsule Commonly known as: PRILOSEC Take 20 mg by mouth daily.   ONE TOUCH DELICA LANCING DEV Misc Use to check blood sugar three times daily.   ONE TOUCH ULTRA 2 w/Device Kit Use to test blood sugar daily Dx code W40.97   OneTouch Delica Lancets 35H Misc 1 each by Does not apply route in the morning, at noon, and at bedtime. Use to check blood sugar three times daily. DX:E11.65   OneTouch Verio test strip Generic drug: glucose blood Use as instructed to check blood sugar three times a day. Dx code E11.65   simvastatin 80 MG tablet Commonly known as: ZOCOR Take 40 mg by mouth at bedtime.   Tyler Aas FlexTouch 100 UNIT/ML FlexTouch Pen Generic drug: insulin degludec Inject 0.2 mLs (20 Units total) into the skin daily. What changed: how much to take       Allergies:  Allergies  Allergen Reactions  . Avodart [Dutasteride] Swelling  . Ibuprofen Nausea And Vomiting  . Morphine Nausea And Vomiting    Past Medical History:  Diagnosis Date  . B12 deficiency   . BPH (benign prostatic hypertrophy)   . Colon polyps   . Diabetes mellitus   . GERD (gastroesophageal reflux disease)    hiatal hernia  . History of kidney stones   . Hyperlipidemia   . Hypertension   . Osteoarthritis     Past Surgical History:  Procedure Laterality Date  . APPENDECTOMY    . CARDIOVASCULAR STRESS TEST  09/02/2011   Post stress myocardial perfusion images show normal pattern of perfusion in all areas. No significant wall abnormalites noted.  Marland Kitchen  CAROTID DOPPLER  01/27/2011   Right & Left ICAs 0-49% diameter reduction, right & left subclavian arteries demonstrated less than 50% diameter reduction.  . CYSTOSCOPY WITH BIOPSY  06/17/2012   Procedure: CYSTOSCOPY WITH BIOPSY;  Surgeon: Molli Hazard, MD;  Location: [facility];  Service: Urology;  Laterality: N/A;  . KNEE CARTILAGE SURGERY     Arthroscope  .  Partial amputaion left foot    . TRANSTHORACIC ECHOCARDIOGRAM  09/02/2011   EF >55%, normal LV systolic function, mild diastolic dysfunction  . TRANSURETHRAL RESECTION OF PROSTATE  06/17/2012   Procedure: TRANSURETHRAL RESECTION OF THE PROSTATE WITH GYRUS INSTRUMENTS;  Surgeon: Molli Hazard, MD;  Location: [facility];  Service: Urology;  Laterality: N/A;    Family History  Problem Relation Age of Onset  . Heart attack Mother   . Hypertension Mother     Social History:  reports that he has quit smoking. He has never used smokeless tobacco. He reports that he does not drink alcohol and does not use drugs.  Review of Systems:  HYPERTENSION: Controlled, only on low-dose atenolol This has been followed by PCP Also on Jardiance 10 mg  HYPERLIPIDEMIA:   Treated with high-dose simvastatin  with good control as of 9/18  Lab Results  Component Value Date   CHOL 177 05/30/2020   HDL 49.90 05/30/2020   LDLCALC 113 (H) 05/30/2020   LDLDIRECT 89.0 06/20/2019   TRIG 72.0 05/30/2020   CHOLHDL 4 05/30/2020   Last foot exam: 07/2017      Examination:   BP 136/74   Pulse 66   Ht 5' 9"  (1.753 m)   Wt 202 lb 6.4 oz (91.8 kg)   SpO2 91%   BMI 29.89 kg/m   Body mass index is 29.89 kg/m.      ASSESSMENT/ PLAN:    Diabetes type 2 insulin Dependent  See history of present illness for detailed discussion of his current management, blood sugar patterns and problems identified   1C surprisingly improved at 7.3  His blood sugars are totally out of control with blood sugars averaging 350 This is markedly unusual for him since  previously A1c was 7.5 He is also symptomatic with hyperglycemia  Part of his hypoglycemia is related to his difficulty remembering to take his insulin and may be getting to do that at least in the evening and may be in the morning also Also has had more stress with his wife and nursing home probably tending to eat out more in the evening  Clearly he is insulin deficient now Although unlikely that stopping Amaryl would cause hypoglycemia at this stage of his diabetes this may have happened before also on one occasion   Recommendations   Restart Amaryl  Check chemistry panel   Change insulin to TRESIBA since his fasting readings may be the highest of the day and unlikely to be getting any higher postprandial readings  Also will not be able to comply with the evening mixed insulin at suppertime; increasing the dose of premixed insulin may risk hypoglycemia overnight also  Since previously had not been able to manage the V-go pump on his own will not attempt this  His son was shown how to use the titration sheet given to him to adjust the [facility] based on fasting blood sugars every 3 to 5 days  We will start with 20 units and stop the 70/30 insulin for now  Continue Amaryl, Jardiance and metformin as long as renal function is normal  His son will call next week to give [place] his blood sugar readings for further adjustment  May consider consultation with diabetes educator also    Patient Instructions  STOP GLIMEPERIDE  START [place] start of each Meal  Jardiance do 2 of the 58m pills and then 25 mg   Restart Simvastatin     Elyjah Hazan 06/05/2020, 11:18 AM  Patient ID: DAVIONNE DOWTY, male   DOB: [birth year], 86 y.o.   MRN: [id number]   Reason for Appointment: Diabetes follow-up   History of Present Illness   Diagnosis: Type 2 DIABETES MELITUS, diagnoses 1972   He has had long-standing diabetes and has been on basal bolus  insulin regimen after failure of oral hypoglycemic drugs over the last several years His blood sugars have been generally fairly well controlled but has a tendency to high postprandial readings Previously required only a small dose of 5 units of Levemir insulin  Taking Amaryl in addition to his insulin regimen  his blood sugars had improved his blood sugars during the daytime  He had taken Toujeo since 9/16 instead of twice a day Levemir  RECENT history:   Insulin regimen:  Novolog mix 70/30: 15 units at breakfast, 8 acs Oral hypoglycemic drugs: Metformin 1500 mg , Jardiance 10 mg daily   A1c is 11.4 compared to 7.5% previously   Current management, blood sugar patterns and problems identified  His blood sugars are markedly increased since his last visit  According to his son he is probably not taking his evening insulin consistently or sometimes after supper at night because he goes to the nursing home to visit his wife late afternoon  He did not take his insulin last night and today his blood sugar was 541  He has lost weight and also appears to be much more thirsty and getting more fatigued  His son thinks that he is also more stressed  AMARYL was stopped on his last visit while there medications were continued  He is checking only FASTING blood sugars and forgets to do readings later in the day  He has not changed his diet and also on his fluid intake he is still continuing to use diet drinks or water    Side effects from medications: None          Proper timing of insulin in relation to meals: Yes.          Monitors blood glucose: <1 times a day.    Glucometer: One Touch ultra 2 .          Blood Glucose readings   .Mean values apply above for all meters except median for One Touch  PRE-MEAL Fasting Lunch Dinner Bedtime Overall  Glucose range:  157-541   ?   Mean/median:  349        PREVIOUS MEDIAN 161   Meals: 3 meals per day. Breakfast at 8 am Lunch 12 noon  Supper at 5-6 pm; breakfast is usually oatmeal with eggs.  Drinks water mostly and no sweet drinks            Physical activity: exercise: occasionally, bike      Wt Readings from Last 3 Encounters:  06/05/20 202 lb 6.4 oz (91.8 kg)  12/05/19 201 lb (91.2 kg)  06/20/19 191 lb 3.2 oz (86.7 kg)   Lab Results  Component Value Date   HGBA1C 10.2 (H) 05/30/2020   HGBA1C 9.7 (H) 09/14/2019   HGBA1C 7.6 (A) 06/20/2019   Lab Results  Component Value Date   MICROALBUR 7.9 (H) 05/30/2020   LDLCALC 113 (H) 05/30/2020   CREATININE 1.27 05/30/2020     Allergies as of 06/05/2020      Reactions   Avodart [dutasteride] Swelling   Ibuprofen Nausea And Vomiting   Morphine Nausea And Vomiting      Medication List       Accurate  as of June 05, 2020 11:18 AM. If you have any questions, ask your nurse or doctor.        STOP taking these medications   glimepiride 4 MG tablet Commonly known as: AMARYL Stopped by: Elayne Snare, MD     TAKE these medications   atenolol 25 MG tablet Commonly known as: TENORMIN TAKE 1 TABLET BY MOUTH IN THE MORNING AND 1/2 TABLET AT BEDTIME   B-D UF III MINI PEN NEEDLES 31G X 5 MM Misc Generic drug: Insulin Pen Needle USE 4 TIMES PER DAY   B-D UF III MINI PEN NEEDLES 31G X 5 MM Misc Generic drug: Insulin Pen Needle USE 4 TIMES PER DAY   cholecalciferol 1000 units tablet Commonly known as: VITAMIN D Take 1,000 Units by mouth daily.   docusate sodium 100 MG capsule Commonly known as: COLACE Take 100 mg by mouth 2 (two) times daily.   donepezil 10 MG tablet Commonly known as: ARICEPT TAKE 1 TABLET BY MOUTH EVERYDAY AT BEDTIME   ferrous gluconate 325 MG tablet Commonly known as: FERGON Take 325 mg by mouth daily with breakfast.   fluticasone 50 MCG/ACT nasal spray Commonly known as: FLONASE PLACE 2 SPRAYS INTO BOTH NOSTRILS DAILY.   Jardiance 10 MG Tabs tablet Generic drug: empagliflozin Take 10 mg by mouth daily with breakfast.    memantine 5 MG tablet Commonly known as: Namenda Take 1 tablet (5 mg total) by mouth 2 (two) times daily.   metFORMIN 1000 MG tablet Commonly known as: GLUCOPHAGE TAKE 1 TABLET IN THE MORNING & 1/2 TABLET AT DINNERTIME   mometasone 0.1 % ointment Commonly known as: Elocon Apply topically daily.   omeprazole 20 MG capsule Commonly known as: PRILOSEC Take 20 mg by mouth daily.   ONE TOUCH DELICA LANCING DEV Misc Use to check blood sugar three times daily.   ONE TOUCH ULTRA 2 w/Device Kit Use to test blood sugar daily Dx code O29.47   OneTouch Delica Lancets 65Y Misc 1 each by Does not apply route in the morning, at noon, and at bedtime. Use to check blood sugar three times daily. DX:E11.65   OneTouch Verio test strip Generic drug: glucose blood Use as instructed to check blood sugar three times a day. Dx code E11.65   simvastatin 80 MG tablet Commonly known as: ZOCOR Take 40 mg by mouth at bedtime.   Tyler Aas FlexTouch 100 UNIT/ML FlexTouch Pen Generic drug: insulin degludec Inject 0.2 mLs (20 Units total) into the skin daily. What changed: how much to take       Allergies:  Allergies  Allergen Reactions  . Avodart [Dutasteride] Swelling  . Ibuprofen Nausea And Vomiting  . Morphine Nausea And Vomiting    Past Medical History:  Diagnosis Date  . B12 deficiency   . BPH (benign prostatic hypertrophy)   . Colon polyps   . Diabetes mellitus   . GERD (gastroesophageal reflux disease)    hiatal hernia  . History of kidney stones   . Hyperlipidemia   . Hypertension   . Osteoarthritis     Past Surgical History:  Procedure Laterality Date  . APPENDECTOMY    . CARDIOVASCULAR STRESS TEST  09/02/2011   Post stress myocardial perfusion images show normal pattern of perfusion in all areas. No significant wall abnormalites noted.  Marland Kitchen CAROTID DOPPLER  01/27/2011   Right & Left ICAs 0-49% diameter reduction, right & left subclavian arteries demonstrated less than 50%  diameter reduction.  . CYSTOSCOPY WITH BIOPSY  06/17/2012   Procedure: CYSTOSCOPY WITH BIOPSY;  Surgeon: Molli Hazard, MD;  Location: [facility];  Service: Urology;  Laterality: N/A;  . KNEE CARTILAGE SURGERY     Arthroscope  . Partial amputaion left foot    . TRANSTHORACIC ECHOCARDIOGRAM  09/02/2011   EF >55%, normal LV systolic function, mild diastolic dysfunction  . TRANSURETHRAL RESECTION OF PROSTATE  06/17/2012   Procedure: TRANSURETHRAL RESECTION OF THE PROSTATE WITH GYRUS INSTRUMENTS;  Surgeon: Molli Hazard, MD;  Location: [facility];  Service: Urology;  Laterality: N/A;    Family History  Problem Relation Age of Onset  . Heart attack Mother   . Hypertension Mother     Social History:  reports that he has quit smoking. He has never used smokeless tobacco. He reports that he does not drink alcohol and does not use drugs.  Review of Systems:  HYPERTENSION:   followed by PCP Taking atenolol only Also is on Jardiance   BP Readings from Last 3 Encounters:  06/05/20 136/74  12/05/19 120/62  06/20/19 (!) 122/56   Renal function:  Lab Results  Component Value Date   CREATININE 1.27 05/30/2020   CREATININE 1.37 09/14/2019   CREATININE 1.22 06/20/2019     HYPERLIPIDEMIA:   Treated with 80 mg simvastatin by his PCP with good control in the past  He was told to make sure he is taking as simvastatin but apparently is not taking this, previously had been prescribed by PCP and LDL is above target  Lab Results  Component Value Date   CHOL 177 05/30/2020   CHOL 175 09/14/2019   CHOL 125 09/14/2018   Lab Results  Component Value Date   HDL 49.90 05/30/2020   HDL 55.00 09/14/2019   HDL 54.00 09/14/2018   Lab Results  Component Value Date   LDLCALC 113 (H) 05/30/2020   LDLCALC 109 (H) 09/14/2019   LDLCALC 59 03/01/2019   Lab Results  Component Value Date   TRIG 72.0 05/30/2020   TRIG 57.0 09/14/2019   TRIG 58.0 09/14/2018   Lab Results  Component  Value Date   CHOLHDL 4 05/30/2020   CHOLHDL 3 09/14/2019   CHOLHDL 2 09/14/2018   Lab Results  Component Value Date   LDLDIRECT 89.0 06/20/2019    Last foot exam:  foot exam in 10/21 with podiatrist with the following findings:  Vascular: dorsalis pedis and posterior tibial pulses are palpable bilateral. Capillary return is immediate. Temperature gradient is WNL. Skin turgor WNL  Sensorium: Normal Semmes Weinstein monofilament test. Normal tactile sensation bilaterally.  Last eye exam 7/19     Examination:   BP 136/74   Pulse 66   Ht 5' 9"  (1.753 m)   Wt 202 lb 6.4 oz (91.8 kg)   SpO2 91%   BMI 29.89 kg/m   Body mass index is 29.89 kg/m.    ASSESSMENT/ PLAN:    Diabetes type 2 insulin requiring  See history of present illness for detailed discussion of his current management, blood sugar patterns and problems identified  His A1c is again significantly high over 10%  He has not been seen in follow-up since 2/21  Likely has significant insulin deficiency with postprandial readings occasionally over 300 However because of his dementia he will not be able to remember to take his mealtime insulin if needed Fasting readings are generally fairly good with some variability Does not appear to have any dietary factors causing his blood sugars to be high    Recommendations today:  Increase Jardiance to 2 tablets of the 10 mg and from the next prescription use 25 mg Need to check renal function and follow-up more regularly  Trial of PRANDIN 1 mg before meals instead of Amaryl which appears to be ineffective currently No change in [place] dosage unless morning sugars start going up with stopping Amaryl Continue Metformin His son will try to check some readings in the evenings either before or after dinner   LIPIDS: His LDL is higher and with his uncontrolled diabetes he would benefit from going back on simvastatin for risk reduction He will start back on 40 mg  daily Check LDL level again on the next visit  Hypertension: Mild and fairly well controlled  Follow-up in 3 months Influenza vaccine given  Patient Instructions  STOP GLIMEPERIDE  START REPEGLANIDE AT THE start of each Meal  Jardiance do 2 of the 64m pills and then 25 mg   Restart Simvastatin     Elster Corbello 06/05/2020, 11:18 AM

## 2020-06-05 NOTE — Telephone Encounter (Signed)
Please see below, this is where his rx's need to go.

## 2020-06-06 ENCOUNTER — Telehealth: Payer: Self-pay | Admitting: *Deleted

## 2020-06-06 NOTE — Telephone Encounter (Signed)
ok 

## 2020-06-11 ENCOUNTER — Other Ambulatory Visit: Payer: Self-pay | Admitting: Family Medicine

## 2020-06-11 ENCOUNTER — Other Ambulatory Visit: Payer: Self-pay | Admitting: Endocrinology

## 2020-06-16 ENCOUNTER — Other Ambulatory Visit: Payer: Self-pay | Admitting: Endocrinology

## 2020-06-17 DIAGNOSIS — E118 Type 2 diabetes mellitus with unspecified complications: Secondary | ICD-10-CM | POA: Diagnosis not present

## 2020-06-17 DIAGNOSIS — I10 Essential (primary) hypertension: Secondary | ICD-10-CM | POA: Diagnosis not present

## 2020-06-17 DIAGNOSIS — E7800 Pure hypercholesterolemia, unspecified: Secondary | ICD-10-CM | POA: Diagnosis not present

## 2020-06-19 ENCOUNTER — Telehealth: Payer: Self-pay | Admitting: Endocrinology

## 2020-06-19 NOTE — Telephone Encounter (Signed)
[facility] received prescriptions. Please fax last office visit progress note to [facility] [phone].  [place] [phone] OPT 1.

## 2020-06-19 NOTE — Telephone Encounter (Signed)
Patient's son Dellis Filbert requests to be called at ph# [phone] re>: Medication management for patient

## 2020-06-26 ENCOUNTER — Ambulatory Visit: Payer: Medicare Other | Admitting: Pharmacist

## 2020-06-26 DIAGNOSIS — E7800 Pure hypercholesterolemia, unspecified: Secondary | ICD-10-CM

## 2020-06-26 DIAGNOSIS — E119 Type 2 diabetes mellitus without complications: Secondary | ICD-10-CM

## 2020-06-26 NOTE — Patient Instructions (Addendum)
Visit Information  Goals Addressed            This Visit's Progress   . Pharmacy Care Plan:       CARE PLAN ENTRY  Current Barriers:  . Chronic Disease Management support, education, and care coordination needs related to Hypertension, Hyperlipidemia, and Diabetes   Hypertension . Pharmacist Clinical Goal(s): o Over the next 180 days, patient will work with PharmD and providers to maintain BP goal <130/80 . Current regimen:  o No current medications for blood pressure . Interventions: o Comprehensive medication review . Patient self care activities - Over the next 180 days, patient will: o Check BP daily, document, and provide at future appointments o Ensure daily salt intake < 2300 mg/day  Hyperlipidemia . Pharmacist Clinical Goal(s): o Over the next 180 days, patient will work with PharmD and providers to maintain LDL goal < 100 . Current regimen:  o Simvastatin 40mg  daily . Interventions: o Comprehensive medication review o Counseled on benefits of statin drugs . Patient self care activities - Over the next 180 days, patient will: o Continue to take medication as directed o Contact PharmD or PCP with any medication related concerns o Notify providers of any muscle pain or cramping  Diabetes . Pharmacist Clinical Goal(s): o Over the next 30 days, patient will work with PharmD and providers to achieve A1c goal <8% . Current regimen:   Tresiba 100 units/ml 16u daily,  Metformin 1000 one tab qam and one-half tab at dinner time,   Jardiance 10mg  daily   Repaglinide 1mg  tid . Interventions: o Comprehensive medication review o Reviewed monitoring logs o Recommended monitoring multiple times per day o Counseled on avoidance of hypogylcemia . Patient self care activities - Over the next 30 days, patient will: o Check blood sugar twice daily, document, and provide at future appointments o Report episodes of hypoglycemia to PharmD or PCP.   Please see past updates  related to this goal by clicking on the "Past Updates" button in the selected goal         The patient verbalized understanding of instructions, educational materials, and care plan provided today and agreed to receive a mailed copy of patient instructions, educational materials, and care plan.   Telephone follow up appointment with pharmacy team member scheduled for: 66 days  Beverly Milch, PharmD Clinical Pharmacist [facility] [phone]   Diabetes Mellitus and Nutrition, Adult When you have diabetes (diabetes mellitus), it is very important to have healthy eating habits because your blood sugar (glucose) levels are greatly affected by what you eat and drink. Eating healthy foods in the appropriate amounts, at about the same times every day, can help you:  Control your blood glucose.  Lower your risk of heart disease.  Improve your blood pressure.  Reach or maintain a healthy weight. Every person with diabetes is different, and each person has different needs for a meal plan. Your health care provider may recommend that you work with a diet and nutrition specialist (dietitian) to make a meal plan that is best for you. Your meal plan may vary depending on factors such as:  The calories you need.  The medicines you take.  Your weight.  Your blood glucose, blood pressure, and cholesterol levels.  Your activity level.  Other health conditions you have, such as heart or kidney disease. How do carbohydrates affect me? Carbohydrates, also called carbs, affect your blood glucose level more than any other type of food. Eating carbs  naturally raises the amount of glucose in your blood. Carb counting is a method for keeping track of how many carbs you eat. Counting carbs is important to keep your blood glucose at a healthy level, especially if you use insulin or take certain oral diabetes medicines. It is important to know how many carbs you can safely have in  each meal. This is different for every person. Your dietitian can help you calculate how many carbs you should have at each meal and for each snack. Foods that contain carbs include:  Bread, cereal, rice, pasta, and crackers.  Potatoes and corn.  Peas, beans, and lentils.  Milk and yogurt.  Fruit and juice.  Desserts, such as cakes, cookies, ice cream, and candy. How does alcohol affect me? Alcohol can cause a sudden decrease in blood glucose (hypoglycemia), especially if you use insulin or take certain oral diabetes medicines. Hypoglycemia can be a life-threatening condition. Symptoms of hypoglycemia (sleepiness, dizziness, and confusion) are similar to symptoms of having too much alcohol. If your health care provider says that alcohol is safe for you, follow these guidelines:  Limit alcohol intake to no more than 1 drink per day for nonpregnant women and 2 drinks per day for men. One drink equals 12 oz of beer, 5 oz of wine, or 1 oz of hard liquor.  Do not drink on an empty stomach.  Keep yourself hydrated with water, diet soda, or unsweetened iced tea.  Keep in mind that regular soda, juice, and other mixers may contain a lot of sugar and must be counted as carbs. What are tips for following this plan?  Reading food labels  Start by checking the serving size on the "Nutrition Facts" label of packaged foods and drinks. The amount of calories, carbs, fats, and other nutrients listed on the label is based on one serving of the item. Many items contain more than one serving per package.  Check the total grams (g) of carbs in one serving. You can calculate the number of servings of carbs in one serving by dividing the total carbs by 15. For example, if a food has 30 g of total carbs, it would be equal to 2 servings of carbs.  Check the number of grams (g) of saturated and trans fats in one serving. Choose foods that have low or no amount of these fats.  Check the number of  milligrams (mg) of salt (sodium) in one serving. Most people should limit total sodium intake to less than 2,300 mg per day.  Always check the nutrition information of foods labeled as "low-fat" or "nonfat". These foods may be higher in added sugar or refined carbs and should be avoided.  Talk to your dietitian to identify your daily goals for nutrients listed on the label. Shopping  Avoid buying canned, premade, or processed foods. These foods tend to be high in fat, sodium, and added sugar.  Shop around the outside edge of the grocery store. This includes fresh fruits and vegetables, bulk grains, fresh meats, and fresh dairy. Cooking  Use low-heat cooking methods, such as baking, instead of high-heat cooking methods like deep frying.  Cook using healthy oils, such as olive, canola, or sunflower oil.  Avoid cooking with butter, cream, or high-fat meats. Meal planning  Eat meals and snacks regularly, preferably at the same times every day. Avoid going long periods of time without eating.  Eat foods high in fiber, such as fresh fruits, vegetables, beans, and whole grains. Talk to  your dietitian about how many servings of carbs you can eat at each meal.  Eat 4-6 ounces (oz) of lean protein each day, such as lean meat, chicken, fish, eggs, or tofu. One oz of lean protein is equal to: ? 1 oz of meat, chicken, or fish. ? 1 egg. ?  cup of tofu.  Eat some foods each day that contain healthy fats, such as avocado, nuts, seeds, and fish. Lifestyle  Check your blood glucose regularly.  Exercise regularly as told by your health care provider. This may include: ? 150 minutes of moderate-intensity or vigorous-intensity exercise each week. This could be brisk walking, biking, or water aerobics. ? Stretching and doing strength exercises, such as yoga or weightlifting, at least 2 times a week.  Take medicines as told by your health care provider.  Do not use any products that contain nicotine  or tobacco, such as cigarettes and e-cigarettes. If you need help quitting, ask your health care provider.  Work with a Social worker or diabetes educator to identify strategies to manage stress and any emotional and social challenges. Questions to ask a health care provider  Do I need to meet with a diabetes educator?  Do I need to meet with a dietitian?  What number can I call if I have questions?  When are the best times to check my blood glucose? Where to find more information:  American Diabetes Association: diabetes.org  Academy of Nutrition and Dietetics: www.eatright.[employer] of Diabetes and Digestive and Kidney Diseases (NIH): [employer] Summary  A healthy meal plan will help you control your blood glucose and maintain a healthy lifestyle.  Working with a diet and nutrition specialist (dietitian) can help you make a meal plan that is best for you.  Keep in mind that carbohydrates (carbs) and alcohol have immediate effects on your blood glucose levels. It is important to count carbs and to use alcohol carefully. This information is not intended to replace advice given to you by your health care provider. Make sure you discuss any questions you have with your health care provider. Document Revised: 06/26/2017 Document Reviewed: 08/18/2016 Elsevier Patient Education  2020 [employer].

## 2020-06-26 NOTE — Chronic Care Management (AMB) (Addendum)
Chronic Care Management Pharmacy  Name: [name]  MRN: [id number] DOB: Nov 27, 1928  Chief Complaint/ HPI  [name],  86 y.o. , male presents for their Follow-Up CCM visit with the clinical pharmacist via telephone.  PCP : Susy Frizzle, MD  Their chronic conditions include: hypertension, Type II DM, CKD, hyperlipidemia.  Office Visits:  12/06/2019 ([facility]) - cellulitis of chest wall, started keflex 552m tid  Consult Visit:  11/07/2019 ([facility]) - at risk foot care for thick nails, nails debrided, regular follow up scheduled  06/20/2019 ([facility]) - no med changes, recommended stationary bike for exercise, was considering increasing Jardiance if A1c does not improve.  Medications: Outpatient Encounter Medications as of 06/26/2020  Medication Sig   atenolol (TENORMIN) 25 MG tablet TAKE 1 TABLET BY MOUTH IN THE MORNING AND 1/2 TABLET AT BEDTIME   B-D UF III MINI PEN NEEDLES 31G X 5 MM MISC USE 4 TIMES PER DAY   Blood Glucose Monitoring Suppl (ONE TOUCH ULTRA 2) w/Device KIT Use to test blood sugar daily Dx code E11.65   cholecalciferol (VITAMIN D) 1000 UNITS tablet Take 1,000 Units by mouth daily.   docusate sodium (COLACE) 100 MG capsule Take 100 mg by mouth 2 (two) times daily.   donepezil (ARICEPT) 10 MG tablet TAKE 1 TABLET BY MOUTH EVERYDAY AT BEDTIME   empagliflozin (JARDIANCE) 25 MG TABS tablet Take 1 tablet (25 mg total) by mouth daily with breakfast.   ferrous gluconate (FERGON) 325 MG tablet Take 325 mg by mouth daily with breakfast.   fluticasone (FLONASE) 50 MCG/ACT nasal spray PLACE 2 SPRAYS INTO BOTH NOSTRILS DAILY.   glucose blood (ONETOUCH VERIO) test strip Use as instructed to check blood sugar three times a day. Dx code E11.65   Insulin Pen Needle (B-D UF III MINI PEN NEEDLES) 31G X 5 MM MISC USE 4 TIMES PER DAY   Lancet Devices (ONE TOUCH DELICA LANCING DEV) MISC Use to check blood sugar three times daily.   memantine  (NAMENDA) 5 MG tablet Take 1 tablet (5 mg total) by mouth 2 (two) times daily.   metFORMIN (GLUCOPHAGE) 1000 MG tablet TAKE 1 TABLET IN THE MORNING & 1/2 TABLET AT DINNERTIME   mometasone (ELOCON) 0.1 % ointment Apply topically daily.   omeprazole (PRILOSEC) 20 MG capsule Take 20 mg by mouth daily.     OneTouch Delica Lancets 342PMISC 1 each by Does not apply route in the morning, at noon, and at bedtime. Use to check blood sugar three times daily. DX:E11.65   repaglinide (PRANDIN) 1 MG tablet Take 1 tablet (1 mg total) by mouth 3 (three) times daily before meals.   simvastatin (ZOCOR) 40 MG tablet Take 1 tablet (40 mg total) by mouth at bedtime.   TRESIBA FLEXTOUCH 100 UNIT/ML FlexTouch Pen INJECT 20 UNITS TOTAL INTO THE SKIN DAILY.   Facility-Administered Encounter Medications as of 06/26/2020  Medication   cyanocobalamin ((VITAMIN B-12)) injection 1,000 mcg     Current Diagnosis/Assessment:  Goals Addressed             This Visit's Progress    Pharmacy Care Plan:       CARE PLAN ENTRY  Current Barriers:  Chronic Disease Management support, education, and care coordination needs related to Hypertension, Hyperlipidemia, and Diabetes   Hypertension Pharmacist Clinical Goal(s): Over the next 180 days, patient will work with PharmD and providers to maintain BP goal <130/80 Current regimen:  No current medications for blood pressure Interventions:  Comprehensive medication review Patient self care activities - Over the next 180 days, patient will: Check BP daily, document, and provide at future appointments Ensure daily salt intake < 2300 mg/day  Hyperlipidemia Pharmacist Clinical Goal(s): Over the next 180 days, patient will work with PharmD and providers to maintain LDL goal < 100 Current regimen:  Simvastatin 76m daily Interventions: Comprehensive medication review Counseled on benefits of statin drugs Patient self care activities - Over the next 180 days, patient  will: Continue to take medication as directed Contact PharmD or PCP with any medication related concerns Notify providers of any muscle pain or cramping  Diabetes Pharmacist Clinical Goal(s): Over the next 30 days, patient will work with PharmD and providers to achieve A1c goal <8% Current regimen:  Tresiba 100 units/ml 16u daily, Metformin 1000 one tab qam and one-half tab at dinner time,  Jardiance 122mdaily  Repaglinide 1m30mid Interventions: Comprehensive medication review Reviewed monitoring logs Recommended monitoring multiple times per day Counseled on avoidance of hypogylcemia Patient self care activities - Over the next 30 days, patient will: Check blood sugar twice daily, document, and provide at future appointments Report episodes of hypoglycemia to PharmD or PCP.   Please see past updates related to this goal by clicking on the "Past Updates" button in the selected goal          Diabetes   Recent Relevant Labs: Lab Results  Component Value Date/Time   HGBA1C 10.2 (H) 05/30/2020 09:18 AM   HGBA1C 9.7 (H) 09/14/2019 08:11 AM   MICROALBUR 7.9 (H) 05/30/2020 09:18 AM   MICROALBUR 28.4 03/01/2019 12:00 AM     Checking BG: Daily  Recent FBG Readings: 150-200 mostly, occasional > 300  Patient is currently uncontrolled on the following medications: Tresiba 100 units/ml 16u daily, Metformin 1000 one tab qam and one-half tab at dinner time,  Jardiance 80m16mily  Repaglinide 1mg 29m  Last diabetic Foot exam:  Lab Results  Component Value Date/Time   HMDIABEYEEXA No Retinopathy 02/24/2018 12:00 AM    Last diabetic Eye exam:  Lab Results  Component Value Date/Time   HMDIABFOOTEX normal 04/18/2019 12:00 AM    We discussed: Son reports that he has been recovering from COVID-19 so he has not been able to personally check blood sugar as much lately. Plans to start checking more than once daily going forward. Denies hypoglycemia, which I am most concerned  about with this advanced age. Reports adherence with new medication repaglinide 1mg t65mwith meals and no adverse effects. Followed by Endocrinology  Plan  Continue current medications.   Check blood sugar bid and record Contact providers with blood sugar logs Hypertension    Office blood pressures are  BP Readings from Last 3 Encounters:  06/05/20 136/74  12/05/19 120/62  06/20/19 (!) 122/56    Patient has failed these meds in the past: trandolapril (low BP)  Patient checks BP at home infrequently  Patient home BP readings are ranging: none noted  Patient is currently controlled on the following medications: Atenolol 25mg o48mablet qam and one-half tablet hs  We discussed: BP controlled, denies dizziness Reports adherence  Plan  No medications needed at this time.     Hyperlipidemia   Lipid Panel     Component Value Date/Time   CHOL 177 05/30/2020 0918   TRIG 72.0 05/30/2020 0918   HDL 49.90 05/30/2020 0918   CHOLHDL 4 05/30/2020 0918   VLDL 14.4 05/30/2020 0918   LDLCALC 113 (H) 05/30/2020 09181610  LDLCALC 43 04/09/2017 1024   LDLDIRECT 89.0 06/20/2019 1026     The ASCVD Risk score (Goff DC Jr., et al., 2013) failed to calculate for the following reasons:   The 2013 ASCVD risk score is only valid for ages 26 to 25   Patient has failed these meds in past: none noted Patient is currently controlled on the following medications:  simvastatin 56m   We discussed: Recently restarted after labs on 05/30/20 Counseled on benefits of statins on cardiovascular protection Denies myalgias at this time, counseled son to watch out for these and notify providers if his dad begins to complain of any adverse effects  Plan  Continue current medications  CKD   Kidney Function Lab Results  Component Value Date/Time   CREATININE 1.27 05/30/2020 09:18 AM   CREATININE 1.37 09/14/2019 08:11 AM   CREATININE 1.19 (H) 04/09/2017 10:24 AM   CREATININE 1.18 (H)  09/05/2016 10:11 AM   GFR 50.24 (L) 05/30/2020 09:18 AM   GFRNONAA 55 (L) 04/09/2017 10:24 AM   GFRAA 64 04/09/2017 10:24 AM   All medications currently safe based on kidney function.  Plan  Continue current medications   Medication Management   Miscellaneous medications: memantine 553mdaily, donepezil 1081maily,  OTC's: vitamin d, Colace prn Patient currently uses [facility].  Phone #  [phone] reports using pill box method organized by his son to promote adherence. Patient denies missed doses of medications.     ChrBeverly MilchharmD Clinical Pharmacist [phone] collaborated with the care management provider regarding care management and care coordination activities outlined in this encounter and have reviewed this encounter including documentation in the note and care plan. I am certifying that I agree with the content of this note and encounter as supervising physician.

## 2020-06-26 NOTE — Telephone Encounter (Signed)
Records faxed.

## 2020-07-10 DIAGNOSIS — I10 Essential (primary) hypertension: Secondary | ICD-10-CM | POA: Diagnosis not present

## 2020-07-10 DIAGNOSIS — E118 Type 2 diabetes mellitus with unspecified complications: Secondary | ICD-10-CM | POA: Diagnosis not present

## 2020-07-10 DIAGNOSIS — E7800 Pure hypercholesterolemia, unspecified: Secondary | ICD-10-CM | POA: Diagnosis not present

## 2020-07-11 DIAGNOSIS — E7800 Pure hypercholesterolemia, unspecified: Secondary | ICD-10-CM | POA: Diagnosis not present

## 2020-07-11 DIAGNOSIS — I10 Essential (primary) hypertension: Secondary | ICD-10-CM | POA: Diagnosis not present

## 2020-07-11 DIAGNOSIS — E118 Type 2 diabetes mellitus with unspecified complications: Secondary | ICD-10-CM | POA: Diagnosis not present

## 2020-07-17 DIAGNOSIS — I10 Essential (primary) hypertension: Secondary | ICD-10-CM | POA: Diagnosis not present

## 2020-07-17 DIAGNOSIS — E7800 Pure hypercholesterolemia, unspecified: Secondary | ICD-10-CM | POA: Diagnosis not present

## 2020-07-17 DIAGNOSIS — E118 Type 2 diabetes mellitus with unspecified complications: Secondary | ICD-10-CM | POA: Diagnosis not present

## 2020-07-25 ENCOUNTER — Telehealth: Payer: Self-pay | Admitting: Pharmacist

## 2020-07-25 NOTE — Chronic Care Management (AMB) (Addendum)
Chronic Care Management Pharmacy Assistant   Name: [name]  MRN: [id number] DOB: 11-18-1928  Reason for Encounter: Disease State for DM  Patient Questions:  1.  Have you seen any other providers since your last visit? No.   2.  Any changes in your medicines or health? No.    PCP : Susy Frizzle, MD   Their chronic conditions include: hypertension, Type II DM, CKD, hyperlipidemia.  Office Visits: Nonce since 06/26/20  Consults: None since  06/26/20  Allergies:   Allergies  Allergen Reactions   Avodart [Dutasteride] Swelling   Ibuprofen Nausea And Vomiting   Morphine Nausea And Vomiting    Medications: Outpatient Encounter Medications as of 07/25/2020  Medication Sig   atenolol (TENORMIN) 25 MG tablet TAKE 1 TABLET BY MOUTH IN THE MORNING AND 1/2 TABLET AT BEDTIME   B-D UF III MINI PEN NEEDLES 31G X 5 MM MISC USE 4 TIMES PER DAY   Blood Glucose Monitoring Suppl (ONE TOUCH ULTRA 2) w/Device KIT Use to test blood sugar daily Dx code E11.65   cholecalciferol (VITAMIN D) 1000 UNITS tablet Take 1,000 Units by mouth daily.   docusate sodium (COLACE) 100 MG capsule Take 100 mg by mouth 2 (two) times daily.   donepezil (ARICEPT) 10 MG tablet TAKE 1 TABLET BY MOUTH EVERYDAY AT BEDTIME   empagliflozin (JARDIANCE) 25 MG TABS tablet Take 1 tablet (25 mg total) by mouth daily with breakfast.   ferrous gluconate (FERGON) 325 MG tablet Take 325 mg by mouth daily with breakfast.   fluticasone (FLONASE) 50 MCG/ACT nasal spray PLACE 2 SPRAYS INTO BOTH NOSTRILS DAILY.   glucose blood (ONETOUCH VERIO) test strip Use as instructed to check blood sugar three times a day. Dx code E11.65   Insulin Pen Needle (B-D UF III MINI PEN NEEDLES) 31G X 5 MM MISC USE 4 TIMES PER DAY   Lancet Devices (ONE TOUCH DELICA LANCING DEV) MISC Use to check blood sugar three times daily.   memantine (NAMENDA) 5 MG tablet Take 1 tablet (5 mg total) by mouth 2 (two) times daily.   metFORMIN  (GLUCOPHAGE) 1000 MG tablet TAKE 1 TABLET IN THE MORNING & 1/2 TABLET AT DINNERTIME   mometasone (ELOCON) 0.1 % ointment Apply topically daily.   omeprazole (PRILOSEC) 20 MG capsule Take 20 mg by mouth daily.     OneTouch Delica Lancets 37S MISC 1 each by Does not apply route in the morning, at noon, and at bedtime. Use to check blood sugar three times daily. DX:E11.65   repaglinide (PRANDIN) 1 MG tablet Take 1 tablet (1 mg total) by mouth 3 (three) times daily before meals.   simvastatin (ZOCOR) 40 MG tablet Take 1 tablet (40 mg total) by mouth at bedtime.   TRESIBA FLEXTOUCH 100 UNIT/ML FlexTouch Pen INJECT 20 UNITS TOTAL INTO THE SKIN DAILY.   Facility-Administered Encounter Medications as of 07/25/2020  Medication   cyanocobalamin ((VITAMIN B-12)) injection 1,000 mcg    Current Diagnosis: Patient Active Problem List   Diagnosis Date Noted   Pain due to onychomycosis of toenails of both feet 01/24/2019   Diabetes mellitus without complication ([place]) 28/31/5176   Type 2 diabetes mellitus without complication ([place]) 16/01/3709   PAC (premature atrial contraction) 05/21/2015   S/P TURP (status post transurethral resection of prostate) 11/16/2013   UTI (urinary tract infection) 11/16/2013   Aortic sclerosis 11/16/2013   Chronic kidney disease, stage II (mild) 08/02/2013   Hypertension    Hyperlipidemia  B12 deficiency    Type II or unspecified type diabetes mellitus without mention of complication, uncontrolled 03/01/2013   Pure hypercholesterolemia 03/01/2013   BPH (benign prostatic hypertrophy) 10/21/2010   Rhinitis 10/21/2010   History of kidney stones 10/21/2010   UNSPECIFIED ANEMIA 03/26/2009   NONSPECIFIC ABNORMAL FINDING IN STOOL CONTENTS 03/26/2009   PERSONAL HISTORY OF COLONIC POLYPS 03/26/2009    Goals Addressed   None    Recent Relevant Labs: Lab Results  Component Value Date/Time   HGBA1C 10.2 (H) 05/30/2020 09:18 AM   HGBA1C 9.7 (H) 09/14/2019 08:11 AM    MICROALBUR 7.9 (H) 05/30/2020 09:18 AM   MICROALBUR 28.4 03/01/2019 12:00 AM    Kidney Function Lab Results  Component Value Date/Time   CREATININE 1.27 05/30/2020 09:18 AM   CREATININE 1.37 09/14/2019 08:11 AM   CREATININE 1.19 (H) 04/09/2017 10:24 AM   CREATININE 1.18 (H) 09/05/2016 10:11 AM   GFR 50.24 (L) 05/30/2020 09:18 AM   GFRNONAA 55 (L) 04/09/2017 10:24 AM   GFRAA 64 04/09/2017 10:24 AM    Current antihyperglycemic regimen:  Tresiba 100 units/ml 16u daily, Metformin 1000 one tab qam and one-half tab at dinner time,  Jardiance 10 mg daily  Repaglinide 1 mg tid  What recent interventions/DTPs have been made to improve glycemic control:  None  Have there been any recent hospitalizations or ED visits since last visit with CPP? No.   Patient son denies hypoglycemic symptoms, including None   Patient son denies hyperglycemic symptoms, including none   How often are you checking your blood sugar? twice daily   What are your blood sugars ranging?  Patient son stated his average reading are around 120-167  During the week, how often does your blood glucose drop below 70? Never, Patient son stated they have been too high before not any low readings.  Are you checking your feet daily/regularly?  Patient son stated he does have some sores on his feet currently and he is planning to take him to a doctor to look at them soon.  Adherence Review: Is the patient currently on a STATIN medication? Yes, Simvastatin.   Is the patient currently on ACE/ARB medication? No.  Does the patient have >5 day gap between last estimated fill dates? No, CPP Please Check.   Follow-Up:  Pharmacist Review   Charlann Lange, RMA Clinical Pharmacist Assistant [phone] 4 minutes spent in review, coordination, and documentation.  Reviewed by: Beverly Milch, PharmD Clinical Pharmacist [facility] [phone]

## 2020-07-31 ENCOUNTER — Ambulatory Visit (INDEPENDENT_AMBULATORY_CARE_PROVIDER_SITE_OTHER): Payer: Medicare Other | Admitting: Family Medicine

## 2020-07-31 ENCOUNTER — Other Ambulatory Visit: Payer: Self-pay

## 2020-07-31 VITALS — BP 132/76 | HR 70 | Temp 98.4°F | Ht 69.0 in | Wt 198.0 lb

## 2020-07-31 DIAGNOSIS — B351 Tinea unguium: Secondary | ICD-10-CM

## 2020-07-31 DIAGNOSIS — M79675 Pain in left toe(s): Secondary | ICD-10-CM

## 2020-07-31 DIAGNOSIS — J310 Chronic rhinitis: Secondary | ICD-10-CM

## 2020-07-31 DIAGNOSIS — J329 Chronic sinusitis, unspecified: Secondary | ICD-10-CM | POA: Diagnosis not present

## 2020-07-31 DIAGNOSIS — M79674 Pain in right toe(s): Secondary | ICD-10-CM

## 2020-07-31 DIAGNOSIS — R509 Fever, unspecified: Secondary | ICD-10-CM | POA: Diagnosis not present

## 2020-07-31 MED ORDER — AMOXICILLIN-POT CLAVULANATE 875-125 MG PO TABS: 1.0000 | ORAL_TABLET | Freq: Two times a day (BID) | ORAL | 0 refills | Status: DC

## 2020-07-31 NOTE — Progress Notes (Signed)
Subjective:    Patient ID: [name], male    DOB: [birth year], 87 y.o.   MRN: [id number]  HPI Symptoms began 4 days ago.  Symptoms include severe head congestion, pain and pressure in his frontal and maxillary sinuses bilaterally, copious rhinorrhea, and subjective fevers.  Son states that he also is more confused.  He has underlying dementia and is likely experiencing some mild delirium due to the infection.  He denies any cough, chest pain, shortness of breath.  He has had 2 doses of the Covid vaccine.  He has not yet had his booster.  Symptoms began shortly after his family came over for Christmas.  Relatives that were around the patient went home and had an upper respiratory infection shortly thereafter.  They are uncertain of whether those relatives were diagnosed with Covid. Past Medical History:  Diagnosis Date  . B12 deficiency   . BPH (benign prostatic hypertrophy)   . Colon polyps   . Diabetes mellitus   . GERD (gastroesophageal reflux disease)    hiatal hernia  . History of kidney stones   . Hyperlipidemia   . Hypertension   . Osteoarthritis    Past Surgical History:  Procedure Laterality Date  . APPENDECTOMY    . CARDIOVASCULAR STRESS TEST  09/02/2011   Post stress myocardial perfusion images show normal pattern of perfusion in all areas. No significant wall abnormalites noted.  Marland Kitchen CAROTID DOPPLER  01/27/2011   Right & Left ICAs 0-49% diameter reduction, right & left subclavian arteries demonstrated less than 50% diameter reduction.  . CYSTOSCOPY WITH BIOPSY  06/17/2012   Procedure: CYSTOSCOPY WITH BIOPSY;  Surgeon: Molli Hazard, MD;  Location: [facility];  Service: Urology;  Laterality: N/A;  . KNEE CARTILAGE SURGERY     Arthroscope  . Partial amputaion left foot    . TRANSTHORACIC ECHOCARDIOGRAM  09/02/2011   EF >55%, normal LV systolic function, mild diastolic dysfunction  . TRANSURETHRAL RESECTION OF PROSTATE  06/17/2012   Procedure: TRANSURETHRAL RESECTION  OF THE PROSTATE WITH GYRUS INSTRUMENTS;  Surgeon: Molli Hazard, MD;  Location: [facility];  Service: Urology;  Laterality: N/A;   Current Outpatient Medications on File Prior to Visit  Medication Sig Dispense Refill  . atenolol (TENORMIN) 25 MG tablet TAKE 1 TABLET BY MOUTH IN THE MORNING AND 1/2 TABLET AT BEDTIME 405 tablet 1  . B-D UF III MINI PEN NEEDLES 31G X 5 MM MISC USE 4 TIMES PER DAY 100 each 2  . Blood Glucose Monitoring Suppl (ONE TOUCH ULTRA 2) w/Device KIT Use to test blood sugar daily Dx code E11.65 1 kit 3  . cholecalciferol (VITAMIN D) 1000 UNITS tablet Take 1,000 Units by mouth daily.    Marland Kitchen docusate sodium (COLACE) 100 MG capsule Take 100 mg by mouth 2 (two) times daily.    Marland Kitchen donepezil (ARICEPT) 10 MG tablet TAKE 1 TABLET BY MOUTH EVERYDAY AT BEDTIME 90 tablet 1  . empagliflozin (JARDIANCE) 25 MG TABS tablet Take 1 tablet (25 mg total) by mouth daily with breakfast. 90 tablet 1  . ferrous gluconate (FERGON) 325 MG tablet Take 325 mg by mouth daily with breakfast.    . fluticasone (FLONASE) 50 MCG/ACT nasal spray PLACE 2 SPRAYS INTO BOTH NOSTRILS DAILY. 48 g 5  . glucose blood (ONETOUCH VERIO) test strip Use as instructed to check blood sugar three times a day. Dx code E11.65 100 each 12  . Insulin Pen Needle (B-D UF III MINI PEN NEEDLES) 31G X  5 MM MISC USE 4 TIMES PER DAY 125 each 3  . Lancet Devices (ONE TOUCH DELICA LANCING DEV) MISC Use to check blood sugar three times daily. 1 each 0  . memantine (NAMENDA) 5 MG tablet Take 1 tablet (5 mg total) by mouth 2 (two) times daily. 180 tablet 3  . metFORMIN (GLUCOPHAGE) 1000 MG tablet TAKE 1 TABLET IN THE MORNING & 1/2 TABLET AT DINNERTIME 135 tablet 2  . mometasone (ELOCON) 0.1 % ointment Apply topically daily. 45 g 0  . omeprazole (PRILOSEC) 20 MG capsule Take 20 mg by mouth daily.    Glory Rosebush Delica Lancets 16X MISC 1 each by Does not apply route in the morning, at noon, and at bedtime. Use to check blood sugar three times  daily. DX:E11.65 100 each 3  . repaglinide (PRANDIN) 1 MG tablet Take 1 tablet (1 mg total) by mouth 3 (three) times daily before meals. 270 tablet 1  . simvastatin (ZOCOR) 40 MG tablet Take 1 tablet (40 mg total) by mouth at bedtime. 90 tablet 1  . TRESIBA FLEXTOUCH 100 UNIT/ML FlexTouch Pen INJECT 20 UNITS TOTAL INTO THE SKIN DAILY. 15 mL 3   Current Facility-Administered Medications on File Prior to Visit  Medication Dose Route Frequency Provider Last Rate Last Admin  . cyanocobalamin ((VITAMIN B-12)) injection 1,000 mcg  1,000 mcg Intramuscular Q30 days Susy Frizzle, MD   1,000 mcg at 09/28/18 1629   Allergies  Allergen Reactions  . Avodart [Dutasteride] Swelling  . Ibuprofen Nausea And Vomiting  . Morphine Nausea And Vomiting   Social History   Socioeconomic History  . Marital status: Married    Spouse name: Not on file  . Number of children: Not on file  . Years of education: Not on file  . Highest education level: Not on file  Occupational History  . Not on file  Tobacco Use  . Smoking status: Former Research scientist (life sciences)  . Smokeless tobacco: Never Used  Substance and Sexual Activity  . Alcohol use: No    Alcohol/week: 0.0 standard drinks  . Drug use: No  . Sexual activity: Not on file    Comment: married to [place], retired.  Other Topics Concern  . Not on file  Social History Narrative  . Not on file   Social Determinants of Health   Financial Resource Strain: Low Risk   . Difficulty of Paying Living Expenses: Not very hard  Food Insecurity: Not on file  Transportation Needs: Not on file  Physical Activity: Not on file  Stress: Not on file  Social Connections: Not on file  Intimate Partner Violence: Not on file     Review of Systems  All other systems reviewed and are negative.      Objective:   Physical Exam Constitutional:      Appearance: He is not ill-appearing or toxic-appearing.  HENT:     Right Ear: Tympanic membrane and ear canal normal.     Left  Ear: Tympanic membrane and ear canal normal.     Nose: Congestion and rhinorrhea present.     Right Turbinates: Enlarged and swollen.     Left Turbinates: Enlarged and swollen.     Right Sinus: Frontal sinus tenderness present.     Left Sinus: Frontal sinus tenderness present.  Eyes:     Conjunctiva/sclera: Conjunctivae normal.  Cardiovascular:     Rate and Rhythm: Normal rate and regular rhythm.     Heart sounds: Normal heart sounds. No murmur heard.  Pulmonary:     Effort: Pulmonary effort is normal. No respiratory distress.     Breath sounds: Normal breath sounds. No wheezing, rhonchi or rales.  Neurological:     Mental Status: He is alert.           Assessment & Plan:  Rhinosinusitis - Plan: COVID19 and Influenza A & B  Patient appears to be suffering from rhinosinusitis due to a sinus infection.  The question is whether this is viral and due to Covid or whether he has a bacterial sinus infection.  Given his advanced age and numerous comorbidities, I will go ahead and start the patient on Augmentin 875 mg twice daily for bacterial sinus infection particular given his delirium and complicating risk factors.  However also recommended that he quarantine and I will screen the patient for COVID-19 as well as influenza.  If he test positive, I would definitely refer him to the infusion clinic for consideration for monoclonal antibodies and/or remdesivir.  Await the results of his Covid test.  His son would like a referral to see a different podiatrist.  Apparently he has been seeing Dr. Prudence Davidson.  However someone prefer to see Dr. Amalia Hailey in the same practice moving forward.  I will convey this information to the foot center and see if they will make the change in providers.

## 2020-08-01 ENCOUNTER — Telehealth: Payer: Self-pay | Admitting: Family Medicine

## 2020-08-01 NOTE — Telephone Encounter (Signed)
Son would like someone to call him today to discuss his dad. He states this morning he had to feed and dress his dad that this is a change from yesterday. He states that he is so weak that he is shaking.  CB# [phone]

## 2020-08-01 NOTE — Telephone Encounter (Signed)
Received call from patient son. Reports that patient has had drastic decline overnight.   Advised to take patient to ER. Verbalized understanding.

## 2020-08-02 LAB — SARS-COVID-2 RNA(COVID19)AND INFLUENZA A&B, QUALITATIVE NAAT
FLU A: NOT DETECTED
FLU B: NOT DETECTED
SARS CoV2 RNA: NOT DETECTED

## 2020-08-03 ENCOUNTER — Other Ambulatory Visit: Payer: Self-pay | Admitting: *Deleted

## 2020-08-03 DIAGNOSIS — R059 Cough, unspecified: Secondary | ICD-10-CM

## 2020-08-06 ENCOUNTER — Ambulatory Visit: Payer: Medicare Other | Admitting: Endocrinology

## 2020-08-07 ENCOUNTER — Other Ambulatory Visit: Payer: Self-pay

## 2020-08-07 ENCOUNTER — Ambulatory Visit
Admission: RE | Admit: 2020-08-07 | Discharge: 2020-08-07 | Disposition: A | Discharge status: Home / Self Care | Payer: Medicare Other | Source: Ambulatory Visit | Attending: Family Medicine | Admitting: Family Medicine

## 2020-08-07 DIAGNOSIS — R059 Cough, unspecified: Secondary | ICD-10-CM | POA: Diagnosis not present

## 2020-08-08 ENCOUNTER — Telehealth: Payer: Self-pay | Admitting: Endocrinology

## 2020-08-08 NOTE — Telephone Encounter (Signed)
[facility] called to request that the HIPPA compliance form be faxed back with a stamp by the doctor's signature

## 2020-08-09 DIAGNOSIS — E785 Hyperlipidemia, unspecified: Secondary | ICD-10-CM | POA: Diagnosis not present

## 2020-08-09 DIAGNOSIS — I10 Essential (primary) hypertension: Secondary | ICD-10-CM | POA: Diagnosis not present

## 2020-08-10 DIAGNOSIS — E7800 Pure hypercholesterolemia, unspecified: Secondary | ICD-10-CM | POA: Diagnosis not present

## 2020-08-10 DIAGNOSIS — E118 Type 2 diabetes mellitus with unspecified complications: Secondary | ICD-10-CM | POA: Diagnosis not present

## 2020-08-10 DIAGNOSIS — I10 Essential (primary) hypertension: Secondary | ICD-10-CM | POA: Diagnosis not present

## 2020-08-16 ENCOUNTER — Telehealth: Payer: Self-pay | Admitting: Podiatry

## 2020-08-16 ENCOUNTER — Other Ambulatory Visit: Payer: Self-pay | Admitting: Family Medicine

## 2020-08-16 DIAGNOSIS — I10 Essential (primary) hypertension: Secondary | ICD-10-CM | POA: Diagnosis not present

## 2020-08-16 DIAGNOSIS — E118 Type 2 diabetes mellitus with unspecified complications: Secondary | ICD-10-CM | POA: Diagnosis not present

## 2020-08-16 DIAGNOSIS — E7800 Pure hypercholesterolemia, unspecified: Secondary | ICD-10-CM | POA: Diagnosis not present

## 2020-08-16 NOTE — Telephone Encounter (Signed)
Patient saw Prudence Davidson in past, son of patient is not pleased with services and would like care to be transitioned over to you as of today

## 2020-08-21 ENCOUNTER — Ambulatory Visit (INDEPENDENT_AMBULATORY_CARE_PROVIDER_SITE_OTHER): Payer: Medicare Other | Admitting: Podiatry

## 2020-08-21 ENCOUNTER — Other Ambulatory Visit: Payer: Self-pay

## 2020-08-21 DIAGNOSIS — B351 Tinea unguium: Secondary | ICD-10-CM | POA: Diagnosis not present

## 2020-08-21 DIAGNOSIS — M79674 Pain in right toe(s): Secondary | ICD-10-CM | POA: Diagnosis not present

## 2020-08-21 DIAGNOSIS — E119 Type 2 diabetes mellitus without complications: Secondary | ICD-10-CM | POA: Diagnosis not present

## 2020-08-21 DIAGNOSIS — M79675 Pain in left toe(s): Secondary | ICD-10-CM

## 2020-08-21 NOTE — Progress Notes (Signed)
  Subjective:  Patient ID: [name], male    DOB: January 09, 1929,  MRN: [id number]  Chief Complaint  Patient presents with  . trimming    BL nails trimming  . lesion    Lesion at Rt bottom heel x few days -no redness/swelling/draiange Tx: none   . Skin Problem    Lt hallux (half amputated toe) with skin cracking -had redness and swelling but better with Rx';[facility]     87 y.o. male presents with the above complaint. History confirmed with patient.  Objective:  Physical Exam: warm, good capillary refill, nail exam onychomycosis of the toenails and pain to palpation, no trophic changes or ulcerative lesions. DP pulses palpable, PT pulses palpable and protective sensation intact Left Foot: HPK noted at distal and plantar hallux stump without warmth, erythema, signs of acute infection noted. No open ulcer upon debridement. Right Foot: HPK right heel   No images are attached to the encounter.  Assessment:   1. Pain due to onychomycosis of toenails of both feet   2. Diabetes mellitus without complication ([place])      Plan:  Patient was evaluated and treated and all questions answered.  Onychomycosis -Nails palliatively debrided secondary to pain  Procedure: Nail Debridement Type of Debridement: manual, sharp debridement. Instrumentation: Nail nipper, rotary burr. Number of Nails: 10  HPK left hallux, right heel -Palliatively debrided no open ulcer noted.    No follow-ups on file.

## 2020-08-27 ENCOUNTER — Ambulatory Visit: Payer: Medicare Other | Admitting: Podiatry

## 2020-09-03 ENCOUNTER — Other Ambulatory Visit: Payer: Self-pay

## 2020-09-03 ENCOUNTER — Ambulatory Visit (INDEPENDENT_AMBULATORY_CARE_PROVIDER_SITE_OTHER): Payer: Medicare Other | Admitting: Family Medicine

## 2020-09-03 VITALS — BP 118/78 | HR 85 | Temp 97.0°F | Ht 69.0 in | Wt 210.0 lb

## 2020-09-03 DIAGNOSIS — L89321 Pressure ulcer of left buttock, stage 1: Secondary | ICD-10-CM | POA: Diagnosis not present

## 2020-09-03 NOTE — Progress Notes (Signed)
Subjective:    Patient ID: [name], male    DOB: May 26, 1929, 87 y.o.   MRN: [id number]  Patient is a very pleasant 87 year old Caucasian male with a history of diabetes mellitus as well as age-related cognitive decline and dementia.  He is here today with his son who does an amazing job taking care of him.  The patient has someone sit with him during the day as well.  Unfortunately, due to declining health, he spends the majority of his day sitting in a chair.  As a result, his son recently noticed a sore forming on his left gluteus.  Over the ischial tuberosity on the left side lateral to the gluteal cleft is a 2 cm area of nonblanching erythema consistent with a stage I pressure ulcer.  In the center of this there is a 6 mm area of superficial skin breakdown consistent with a forming stage II.  There is no evidence of cellulitis or drainage or infection Past Medical History:  Diagnosis Date  . B12 deficiency   . BPH (benign prostatic hypertrophy)   . Colon polyps   . Diabetes mellitus   . GERD (gastroesophageal reflux disease)    hiatal hernia  . History of kidney stones   . Hyperlipidemia   . Hypertension   . Osteoarthritis    Past Surgical History:  Procedure Laterality Date  . APPENDECTOMY    . CARDIOVASCULAR STRESS TEST  09/02/2011   Post stress myocardial perfusion images show normal pattern of perfusion in all areas. No significant wall abnormalites noted.  Marland Kitchen CAROTID DOPPLER  01/27/2011   Right & Left ICAs 0-49% diameter reduction, right & left subclavian arteries demonstrated less than 50% diameter reduction.  . CYSTOSCOPY WITH BIOPSY  06/17/2012   Procedure: CYSTOSCOPY WITH BIOPSY;  Surgeon: Molli Hazard, MD;  Location: [facility];  Service: Urology;  Laterality: N/A;  . KNEE CARTILAGE SURGERY     Arthroscope  . Partial amputaion left foot    . TRANSTHORACIC ECHOCARDIOGRAM  09/02/2011   EF >55%, normal LV systolic function, mild diastolic dysfunction  .  TRANSURETHRAL RESECTION OF PROSTATE  06/17/2012   Procedure: TRANSURETHRAL RESECTION OF THE PROSTATE WITH GYRUS INSTRUMENTS;  Surgeon: Molli Hazard, MD;  Location: [facility];  Service: Urology;  Laterality: N/A;   Current Outpatient Medications on File Prior to Visit  Medication Sig Dispense Refill  . amoxicillin-clavulanate (AUGMENTIN) 875-125 MG tablet Take 1 tablet by mouth 2 (two) times daily. 20 tablet 0  . atenolol (TENORMIN) 25 MG tablet TAKE 1 TABLET BY MOUTH IN THE MORNING AND 1/2 TABLET AT BEDTIME 405 tablet 1  . B-D UF III MINI PEN NEEDLES 31G X 5 MM MISC USE 4 TIMES PER DAY 100 each 2  . Blood Glucose Monitoring Suppl (ONE TOUCH ULTRA 2) w/Device KIT Use to test blood sugar daily Dx code E11.65 1 kit 3  . cholecalciferol (VITAMIN D) 1000 UNITS tablet Take 1,000 Units by mouth daily.    Marland Kitchen docusate sodium (COLACE) 100 MG capsule Take 100 mg by mouth 2 (two) times daily.    Marland Kitchen donepezil (ARICEPT) 10 MG tablet TAKE 1 TABLET BY MOUTH EVERYDAY AT BEDTIME 90 tablet 1  . empagliflozin (JARDIANCE) 25 MG TABS tablet Take 1 tablet (25 mg total) by mouth daily with breakfast. 90 tablet 1  . ferrous gluconate (FERGON) 325 MG tablet Take 325 mg by mouth daily with breakfast.    . fluticasone (FLONASE) 50 MCG/ACT nasal spray PLACE 2 SPRAYS  INTO BOTH NOSTRILS DAILY. 48 g 5  . glucose blood (ONETOUCH VERIO) test strip Use as instructed to check blood sugar three times a day. Dx code E11.65 100 each 12  . Insulin Pen Needle (B-D UF III MINI PEN NEEDLES) 31G X 5 MM MISC USE 4 TIMES PER DAY 125 each 3  . Lancet Devices (ONE TOUCH DELICA LANCING DEV) MISC Use to check blood sugar three times daily. 1 each 0  . memantine (NAMENDA) 5 MG tablet Take 1 tablet (5 mg total) by mouth 2 (two) times daily. 180 tablet 3  . metFORMIN (GLUCOPHAGE) 1000 MG tablet TAKE 1 TABLET IN THE MORNING & 1/2 TABLET AT DINNERTIME 135 tablet 2  . mometasone (ELOCON) 0.1 % ointment Apply topically daily. 45 g 0  . omeprazole  (PRILOSEC) 20 MG capsule Take 20 mg by mouth daily.    Glory Rosebush Delica Lancets 66Q MISC 1 each by Does not apply route in the morning, at noon, and at bedtime. Use to check blood sugar three times daily. DX:E11.65 100 each 3  . repaglinide (PRANDIN) 1 MG tablet Take 1 tablet (1 mg total) by mouth 3 (three) times daily before meals. 270 tablet 1  . simvastatin (ZOCOR) 40 MG tablet Take 1 tablet (40 mg total) by mouth at bedtime. 90 tablet 1  . TRESIBA FLEXTOUCH 100 UNIT/ML FlexTouch Pen INJECT 20 UNITS TOTAL INTO THE SKIN DAILY. 15 mL 3   Current Facility-Administered Medications on File Prior to Visit  Medication Dose Route Frequency Provider Last Rate Last Admin  . cyanocobalamin ((VITAMIN B-12)) injection 1,000 mcg  1,000 mcg Intramuscular Q30 days Susy Frizzle, MD   1,000 mcg at 09/28/18 1629   Allergies  Allergen Reactions  . Avodart [Dutasteride] Swelling  . Ibuprofen Nausea And Vomiting  . Morphine Nausea And Vomiting   Social History   Socioeconomic History  . Marital status: Married    Spouse name: Not on file  . Number of children: Not on file  . Years of education: Not on file  . Highest education level: Not on file  Occupational History  . Not on file  Tobacco Use  . Smoking status: Former Research scientist (life sciences)  . Smokeless tobacco: Never Used  Substance and Sexual Activity  . Alcohol use: No    Alcohol/week: 0.0 standard drinks  . Drug use: No  . Sexual activity: Not on file    Comment: married to [place], retired.  Other Topics Concern  . Not on file  Social History Narrative  . Not on file   Social Determinants of Health   Financial Resource Strain: Low Risk   . Difficulty of Paying Living Expenses: Not very hard  Food Insecurity: Not on file  Transportation Needs: Not on file  Physical Activity: Not on file  Stress: Not on file  Social Connections: Not on file  Intimate Partner Violence: Not on file     Review of Systems  All other systems reviewed and are  negative.      Objective:   Physical Exam Constitutional:      Appearance: He is not ill-appearing or toxic-appearing.  HENT:     Nose:     Right Turbinates: Enlarged and swollen.     Left Turbinates: Enlarged and swollen.     Right Sinus: Frontal sinus tenderness present.     Left Sinus: Frontal sinus tenderness present.  Cardiovascular:     Rate and Rhythm: Normal rate and regular rhythm.     Heart  sounds: Normal heart sounds. No murmur heard.   Pulmonary:     Effort: Pulmonary effort is normal. No respiratory distress.     Breath sounds: Normal breath sounds. No wheezing, rhonchi or rales.  Musculoskeletal:       Legs:  Neurological:     Mental Status: He is alert.           Assessment & Plan:  Pressure injury of left buttock, stage 1  We discussed the situation.  The only treatment for this is to offload pressure every 2 hours if possible.  Family is doing a very good job caring for him.  I emphasized that this is no ones fault.  Is simply a result of him losing adipose tissue in his gluteal region and spending more time sitting in a chair.  I recommended trying to get a thicker cushion for him to sit on and then to encourage ambulation throughout the day as much as possible simply to offload pressure.  Monitor for any worsening of skin breakdown and try to keep the areas clean and dry as possible to prevent secondary infection

## 2020-09-09 DIAGNOSIS — I10 Essential (primary) hypertension: Secondary | ICD-10-CM | POA: Diagnosis not present

## 2020-09-09 DIAGNOSIS — E7800 Pure hypercholesterolemia, unspecified: Secondary | ICD-10-CM | POA: Diagnosis not present

## 2020-09-09 DIAGNOSIS — E118 Type 2 diabetes mellitus with unspecified complications: Secondary | ICD-10-CM | POA: Diagnosis not present

## 2020-09-10 ENCOUNTER — Other Ambulatory Visit: Payer: Self-pay | Admitting: Endocrinology

## 2020-09-11 ENCOUNTER — Other Ambulatory Visit: Payer: Self-pay | Admitting: Endocrinology

## 2020-09-15 DIAGNOSIS — E7800 Pure hypercholesterolemia, unspecified: Secondary | ICD-10-CM | POA: Diagnosis not present

## 2020-09-15 DIAGNOSIS — I10 Essential (primary) hypertension: Secondary | ICD-10-CM | POA: Diagnosis not present

## 2020-09-15 DIAGNOSIS — E118 Type 2 diabetes mellitus with unspecified complications: Secondary | ICD-10-CM | POA: Diagnosis not present

## 2020-09-20 ENCOUNTER — Other Ambulatory Visit: Payer: Self-pay

## 2020-09-23 NOTE — Progress Notes (Signed)
Patient ID: [name], male   DOB: 24-Oct-1928, 87 y.o.   MRN: [id number]   Reason for Appointment: Diabetes follow-up   History of Present Illness   Diagnosis: Type 2 DIABETES MELITUS, diagnoses 1972   He has had long-standing diabetes and has been on basal bolus insulin regimen after failure of oral hypoglycemic drugs over the last several years His blood sugars have been generally fairly well controlled but has a tendency to high postprandial readings Previously required only a small dose of 5 units of Levemir insulin  Taking Amaryl in addition to his insulin regimen  his blood sugars had improved his blood sugars during the daytime  He had taken Toujeo since 9/16 instead of twice a day Levemir  RECENT history:   Insulin regimen:  TRESIBA 16 units at breakfast  Oral hypoglycemic drugs: Metformin 1000 mg a.m. and 500 mg p.m., Jardiance 25 mg daily, Prandin 1 mg before meals   Current management, blood sugar patterns and problems identified  His A1c is improved at 7.5 compared to 10.2   He is still having his blood sugar checked with the fingerstick only in the mornings  This is despite having supplies of the freestyle libre and did not let us know he had this  Some of the readings in the mornings may be after eating especially if they are over 300  Some of the readings are higher if they are checked right after eating  His Amaryl was changed to Prandin because of likely high sugars after meals in 11/21  Also Jardiance was increased from 10 mg twice daily to 25 mg  He is not able to manage any of his day-to-day care on his own  No hypoglycemia reported although in the past may not have had symptoms consistently with low blood sugars  Usually is lunch provided by [employer]  He has a mixed meal in the evening that his son brings him  He appears to have lost weight but not clear if he had an accurate weight checked  His son has stopped giving  him the sugar-free cookies which she was previously eating regularly   Side effects from medications: None          Proper timing of insulin in relation to meals: Yes.          Monitors blood glucose: <1 times a day.    Glucometer: One Probation officer .          Blood Glucose readings from  meter download   PRE-MEAL Fasting Lunch Dinner Bedtime Overall  Glucose range: 121-364      Mean/median: 209     209     PREVIOUS a.m. blood sugars range 125-369 median reading 200    Meals: 3 meals per day. Breakfast at 8 am Lunch 12 noon Supper at 5-6 pm Drinks water mostly and no sweet drinks    Wt Readings from Last 3 Encounters:  09/24/20 193 lb (87.5 kg)  09/03/20 210 lb (95.3 kg)  07/31/20 198 lb (89.8 kg)   Lab Results  Component Value Date   HGBA1C 7.5 (A) 09/24/2020   HGBA1C 10.2 (H) 05/30/2020   HGBA1C 9.7 (H) 09/14/2019   Lab Results  Component Value Date   MICROALBUR 7.9 (H) 05/30/2020   LDLCALC 113 (H) 05/30/2020   CREATININE 1.27 05/30/2020     Allergies as of 09/24/2020      Reactions   Avodart [dutasteride] Swelling   Ibuprofen Nausea And  Vomiting   Morphine Nausea And Vomiting      Medication List       Accurate as of September 24, 2020  1:04 PM. If you have any questions, ask your nurse or doctor.        amoxicillin-clavulanate 875-125 MG tablet Commonly known as: AUGMENTIN Take 1 tablet by mouth 2 (two) times daily.   atenolol 25 MG tablet Commonly known as: TENORMIN TAKE 1 TABLET BY MOUTH IN THE MORNING AND 1/2 TABLET AT BEDTIME   B-D UF III MINI PEN NEEDLES 31G X 5 MM Misc Generic drug: Insulin Pen Needle USE 4 TIMES PER DAY   B-D UF III MINI PEN NEEDLES 31G X 5 MM Misc Generic drug: Insulin Pen Needle USE 4 TIMES PER DAY   cholecalciferol 1000 units tablet Commonly known as: VITAMIN D Take 1,000 Units by mouth daily.   docusate sodium 100 MG capsule Commonly known as: COLACE Take 100 mg by mouth 2 (two) times daily.   donepezil 10  MG tablet Commonly known as: ARICEPT TAKE 1 TABLET BY MOUTH EVERYDAY AT BEDTIME   empagliflozin 25 MG Tabs tablet Commonly known as: Jardiance Take 1 tablet (25 mg total) by mouth daily with breakfast. What changed: Another medication with the same name was removed. Continue taking this medication, and follow the directions you see here. Changed by: Elayne Snare, MD   ferrous gluconate 325 MG tablet Commonly known as: FERGON Take 325 mg by mouth daily with breakfast.   fluticasone 50 MCG/ACT nasal spray Commonly known as: FLONASE PLACE 2 SPRAYS INTO BOTH NOSTRILS DAILY.   memantine 5 MG tablet Commonly known as: Namenda Take 1 tablet (5 mg total) by mouth 2 (two) times daily.   metFORMIN 1000 MG tablet Commonly known as: GLUCOPHAGE TAKE 1 TABLET IN THE MORNING & 1/2 TABLET AT DINNERTIME   mometasone 0.1 % ointment Commonly known as: Elocon Apply topically daily.   omeprazole 20 MG capsule Commonly known as: PRILOSEC Take 20 mg by mouth daily.   ONE TOUCH DELICA LANCING DEV Misc Use to check blood sugar three times daily.   ONE TOUCH ULTRA 2 w/Device Kit Use to test blood sugar daily Dx code S06.30   OneTouch Delica Lancets 16W Misc 1 each by Does not apply route in the morning, at noon, and at bedtime. Use to check blood sugar three times daily. DX:E11.65   OneTouch Verio test strip Generic drug: glucose blood USE AS INSTRUCTED TO CHECK BLOOD SUGAR THREE TIMES A DAY. DX CODE E11.65   repaglinide 1 MG tablet Commonly known as: PRANDIN Take 1 tablet (1 mg total) by mouth 3 (three) times daily before meals.   simvastatin 40 MG tablet Commonly known as: ZOCOR Take 1 tablet (40 mg total) by mouth at bedtime.   Tyler Aas FlexTouch 100 UNIT/ML FlexTouch Pen Generic drug: insulin degludec INJECT 20 UNITS TOTAL INTO THE SKIN DAILY.       Allergies:  Allergies  Allergen Reactions  . Avodart [Dutasteride] Swelling  . Ibuprofen Nausea And Vomiting  . Morphine  Nausea And Vomiting    Past Medical History:  Diagnosis Date  . B12 deficiency   . BPH (benign prostatic hypertrophy)   . Colon polyps   . Diabetes mellitus   . GERD (gastroesophageal reflux disease)    hiatal hernia  . History of kidney stones   . Hyperlipidemia   . Hypertension   . Osteoarthritis     Past Surgical History:  Procedure Laterality Date  .  APPENDECTOMY    . CARDIOVASCULAR STRESS TEST  09/02/2011   Post stress myocardial perfusion images show normal pattern of perfusion in all areas. No significant wall abnormalites noted.  Marland Kitchen CAROTID DOPPLER  01/27/2011   Right & Left ICAs 0-49% diameter reduction, right & left subclavian arteries demonstrated less than 50% diameter reduction.  . CYSTOSCOPY WITH BIOPSY  06/17/2012   Procedure: CYSTOSCOPY WITH BIOPSY;  Surgeon: Molli Hazard, MD;  Location: [facility];  Service: Urology;  Laterality: N/A;  . KNEE CARTILAGE SURGERY     Arthroscope  . Partial amputaion left foot    . TRANSTHORACIC ECHOCARDIOGRAM  09/02/2011   EF >55%, normal LV systolic function, mild diastolic dysfunction  . TRANSURETHRAL RESECTION OF PROSTATE  06/17/2012   Procedure: TRANSURETHRAL RESECTION OF THE PROSTATE WITH GYRUS INSTRUMENTS;  Surgeon: Molli Hazard, MD;  Location: [facility];  Service: Urology;  Laterality: N/A;    Family History  Problem Relation Age of Onset  . Heart attack Mother   . Hypertension Mother     Social History:  reports that he has quit smoking. He has never used smokeless tobacco. He reports that he does not drink alcohol and does not use drugs.  Review of Systems:  HYPERTENSION: Controlled, only on low-dose atenolol This has been followed by PCP Also on Jardiance 10 mg  HYPERLIPIDEMIA:   Treated with high-dose simvastatin  with good control as of 9/18  Lab Results  Component Value Date   CHOL 177 05/30/2020   HDL 49.90 05/30/2020   LDLCALC 113 (H) 05/30/2020   LDLDIRECT 89.0 06/20/2019   TRIG 72.0  05/30/2020   CHOLHDL 4 05/30/2020   Last foot exam: 07/2017      Examination:   BP 138/80   Pulse 62   Ht 5' 9"  (1.753 m)   Wt 193 lb (87.5 kg)   SpO2 92%   BMI 28.50 kg/m   Body mass index is 28.5 kg/m.      ASSESSMENT/ PLAN:    Diabetes type 2 insulin Dependent  See history of present illness for detailed discussion of his current management, blood sugar patterns and problems identified   A1C surprisingly improved at 7.5  His blood sugars are totally out of control with blood sugars averaging 350 This is markedly unusual for him since previously A1c was 7.5 He is also symptomatic with hyperglycemia  Part of his hypoglycemia is related to his difficulty remembering to take his insulin and may be getting to do that at least in the evening and may be in the morning also Also has had more stress with his wife and nursing home probably tending to eat out more in the evening  Clearly he is insulin deficient now Although unlikely that stopping Amaryl would cause hypoglycemia at this stage of his diabetes this may have happened before also on one occasion   Recommendations    Check chemistry panel   Change insulin to TRESIBA since his fasting readings may be the highest of the day and unlikely to be getting any higher postprandial readings  Also will not be able to comply with the evening mixed insulin at suppertime; increasing the dose of premixed insulin may risk hypoglycemia overnight also  Since previously had not been able to manage the V-go pump on his own will not attempt this  His son was shown how to use the titration sheet given to him to adjust the [facility] based on fasting blood sugars every 3 to 5 days  We will start with 20 units and stop the 70/30 insulin for now  Continue Amaryl, Jardiance and metformin as long as renal function is normal  His son will call next week to give [place] his blood sugar readings for further adjustment  May consider consultation  with diabetes educator also    Patient Instructions  Check blood sugars on waking up 4 days a week  Also check blood sugars about 2 hours after meals and do this after different meals by rotation  Recommended blood sugar levels on waking up are 90-130 and about 2 hours after meal is 130-160  Please bring your blood sugar monitor to each visit, thank you  VITAMIN B12 OTC 1000MG DAILY     Elayne Snare 09/24/2020, 1:04 PM                     Patient ID: [name], male   DOB: [birth year], 87 y.o.   MRN: [id number]   Reason for Appointment: Diabetes follow-up   History of Present Illness   Diagnosis: Type 2 DIABETES MELITUS, diagnoses 1972   He has had long-standing diabetes and has been on basal bolus insulin regimen after failure of oral hypoglycemic drugs over the last several years His blood sugars have been generally fairly well controlled but has a tendency to high postprandial readings Previously required only a small dose of 5 units of Levemir insulin  Taking Amaryl in addition to his insulin regimen  his blood sugars had improved his blood sugars during the daytime  He had taken Toujeo since 9/16 instead of twice a day Levemir  RECENT history:   Insulin regimen:  Novolog mix 70/30: 15 units at breakfast, 8 acs Oral hypoglycemic drugs: Metformin 1500 mg , Jardiance 10 mg daily   A1c is 11.4 compared to 7.5% previously   Current management, blood sugar patterns and problems identified  His blood sugars are markedly increased since his last visit  According to his son he is probably not taking his evening insulin consistently or sometimes after supper at night because he goes to the nursing home to visit his wife late afternoon  He did not take his insulin last night and today his blood sugar was 541  He has lost weight and also appears to be much more thirsty and getting more fatigued  His son thinks that he is also more stressed  AMARYL was  stopped on his last visit while there medications were continued  He is checking only FASTING blood sugars and forgets to do readings later in the day  He has not changed his diet and also on his fluid intake he is still continuing to use diet drinks or water    Side effects from medications: None          Proper timing of insulin in relation to meals: Yes.          Monitors blood glucose: <1 times a day.    Glucometer: One Touch ultra 2 .          Blood Glucose readings   .Mean values apply above for all meters except median for One Touch  PRE-MEAL Fasting Lunch Dinner Bedtime Overall  Glucose range:  157-541   ?   Mean/median:  349        PREVIOUS MEDIAN 161   Meals: 3 meals per day. Breakfast at 8 am Lunch 12 noon Supper at 5-6 pm; breakfast is usually oatmeal with eggs.  Drinks water  mostly and no sweet drinks            Physical activity: exercise: occasionally, bike      Wt Readings from Last 3 Encounters:  09/24/20 193 lb (87.5 kg)  09/03/20 210 lb (95.3 kg)  07/31/20 198 lb (89.8 kg)   Lab Results  Component Value Date   HGBA1C 7.5 (A) 09/24/2020   HGBA1C 10.2 (H) 05/30/2020   HGBA1C 9.7 (H) 09/14/2019   Lab Results  Component Value Date   MICROALBUR 7.9 (H) 05/30/2020   LDLCALC 113 (H) 05/30/2020   CREATININE 1.27 05/30/2020     Allergies as of 09/24/2020      Reactions   Avodart [dutasteride] Swelling   Ibuprofen Nausea And Vomiting   Morphine Nausea And Vomiting      Medication List       Accurate as of September 24, 2020  1:04 PM. If you have any questions, ask your nurse or doctor.        amoxicillin-clavulanate 875-125 MG tablet Commonly known as: AUGMENTIN Take 1 tablet by mouth 2 (two) times daily.   atenolol 25 MG tablet Commonly known as: TENORMIN TAKE 1 TABLET BY MOUTH IN THE MORNING AND 1/2 TABLET AT BEDTIME   B-D UF III MINI PEN NEEDLES 31G X 5 MM Misc Generic drug: Insulin Pen Needle USE 4 TIMES PER DAY   B-D UF III MINI  PEN NEEDLES 31G X 5 MM Misc Generic drug: Insulin Pen Needle USE 4 TIMES PER DAY   cholecalciferol 1000 units tablet Commonly known as: VITAMIN D Take 1,000 Units by mouth daily.   docusate sodium 100 MG capsule Commonly known as: COLACE Take 100 mg by mouth 2 (two) times daily.   donepezil 10 MG tablet Commonly known as: ARICEPT TAKE 1 TABLET BY MOUTH EVERYDAY AT BEDTIME   empagliflozin 25 MG Tabs tablet Commonly known as: Jardiance Take 1 tablet (25 mg total) by mouth daily with breakfast. What changed: Another medication with the same name was removed. Continue taking this medication, and follow the directions you see here. Changed by: Elayne Snare, MD   ferrous gluconate 325 MG tablet Commonly known as: FERGON Take 325 mg by mouth daily with breakfast.   fluticasone 50 MCG/ACT nasal spray Commonly known as: FLONASE PLACE 2 SPRAYS INTO BOTH NOSTRILS DAILY.   memantine 5 MG tablet Commonly known as: Namenda Take 1 tablet (5 mg total) by mouth 2 (two) times daily.   metFORMIN 1000 MG tablet Commonly known as: GLUCOPHAGE TAKE 1 TABLET IN THE MORNING & 1/2 TABLET AT DINNERTIME   mometasone 0.1 % ointment Commonly known as: Elocon Apply topically daily.   omeprazole 20 MG capsule Commonly known as: PRILOSEC Take 20 mg by mouth daily.   ONE TOUCH DELICA LANCING DEV Misc Use to check blood sugar three times daily.   ONE TOUCH ULTRA 2 w/Device Kit Use to test blood sugar daily Dx code J19.14   OneTouch Delica Lancets 78G Misc 1 each by Does not apply route in the morning, at noon, and at bedtime. Use to check blood sugar three times daily. DX:E11.65   OneTouch Verio test strip Generic drug: glucose blood USE AS INSTRUCTED TO CHECK BLOOD SUGAR THREE TIMES A DAY. DX CODE E11.65   repaglinide 1 MG tablet Commonly known as: PRANDIN Take 1 tablet (1 mg total) by mouth 3 (three) times daily before meals.   simvastatin 40 MG tablet Commonly known as: ZOCOR Take 1  tablet (40 mg total) by  mouth at bedtime.   Tyler Aas FlexTouch 100 UNIT/ML FlexTouch Pen Generic drug: insulin degludec INJECT 20 UNITS TOTAL INTO THE SKIN DAILY.       Allergies:  Allergies  Allergen Reactions  . Avodart [Dutasteride] Swelling  . Ibuprofen Nausea And Vomiting  . Morphine Nausea And Vomiting    Past Medical History:  Diagnosis Date  . B12 deficiency   . BPH (benign prostatic hypertrophy)   . Colon polyps   . Diabetes mellitus   . GERD (gastroesophageal reflux disease)    hiatal hernia  . History of kidney stones   . Hyperlipidemia   . Hypertension   . Osteoarthritis     Past Surgical History:  Procedure Laterality Date  . APPENDECTOMY    . CARDIOVASCULAR STRESS TEST  09/02/2011   Post stress myocardial perfusion images show normal pattern of perfusion in all areas. No significant wall abnormalites noted.  Marland Kitchen CAROTID DOPPLER  01/27/2011   Right & Left ICAs 0-49% diameter reduction, right & left subclavian arteries demonstrated less than 50% diameter reduction.  . CYSTOSCOPY WITH BIOPSY  06/17/2012   Procedure: CYSTOSCOPY WITH BIOPSY;  Surgeon: Molli Hazard, MD;  Location: [facility];  Service: Urology;  Laterality: N/A;  . KNEE CARTILAGE SURGERY     Arthroscope  . Partial amputaion left foot    . TRANSTHORACIC ECHOCARDIOGRAM  09/02/2011   EF >55%, normal LV systolic function, mild diastolic dysfunction  . TRANSURETHRAL RESECTION OF PROSTATE  06/17/2012   Procedure: TRANSURETHRAL RESECTION OF THE PROSTATE WITH GYRUS INSTRUMENTS;  Surgeon: Molli Hazard, MD;  Location: [facility];  Service: Urology;  Laterality: N/A;    Family History  Problem Relation Age of Onset  . Heart attack Mother   . Hypertension Mother     Social History:  reports that he has quit smoking. He has never used smokeless tobacco. He reports that he does not drink alcohol and does not use drugs.  Review of Systems:  HYPERTENSION:   followed by PCP Taking atenolol  only Also is on Jardiance   BP Readings from Last 3 Encounters:  09/24/20 138/80  09/03/20 118/78  07/31/20 132/76   Renal function:  Lab Results  Component Value Date   CREATININE 1.27 05/30/2020   CREATININE 1.37 09/14/2019   CREATININE 1.22 06/20/2019     HYPERLIPIDEMIA:   Treated with 40 mg simvastatin This was restarted on the last visit since he may not have been taking it LDL has been higher in 2021   Lab Results  Component Value Date   CHOL 177 05/30/2020   CHOL 175 09/14/2019   CHOL 125 09/14/2018   Lab Results  Component Value Date   HDL 49.90 05/30/2020   HDL 55.00 09/14/2019   HDL 54.00 09/14/2018   Lab Results  Component Value Date   LDLCALC 113 (H) 05/30/2020   LDLCALC 109 (H) 09/14/2019   LDLCALC 59 03/01/2019   Lab Results  Component Value Date   TRIG 72.0 05/30/2020   TRIG 57.0 09/14/2019   TRIG 58.0 09/14/2018   Lab Results  Component Value Date   CHOLHDL 4 05/30/2020   CHOLHDL 3 09/14/2019   CHOLHDL 2 09/14/2018   Lab Results  Component Value Date   LDLDIRECT 89.0 06/20/2019    Foot care:  foot exam in 10/21 with podiatrist with the following findings:  Vascular: dorsalis pedis and posterior tibial pulses are palpable bilateral. Capillary return is immediate. Temperature gradient is WNL. Skin turgor WNL  Sensorium: Normal Semmes  Weinstein monofilament test. Normal tactile sensation bilaterally.      Examination:   BP 138/80   Pulse 62   Ht 5' 9"  (1.753 m)   Wt 193 lb (87.5 kg)   SpO2 92%   BMI 28.50 kg/m   Body mass index is 28.5 kg/m.    ASSESSMENT/ PLAN:    Diabetes type 2 insulin requiring  See history of present illness for detailed discussion of his current management, blood sugar patterns and problems identified  His A1c is much better at 7.5 compared to 10%  Not clear if this is improved because of better diet, increasing Jardiance or using Prandin instead of Amaryl Blood sugar monitoring is inadequate  and mostly done in the mornings randomly before or after meals As before morning readings are highly variable ranging from 121 up to 364 Again not able to do much physical activity for exercise    Recommendations today:   No change in medications or insulin  To try and get the freestyle libre reader from the supplier and will start instructing his son on how to use this  With this he can be monitored more frequently also  Discussed how the data from freestyle libre would be useful especially if he checks this more than once a day   LIPIDS: Lipids to be checked on simvastatin 40 mg daily that was restarted on the last visit Check LDL level again today  Hypertension: Mild and well controlled, also on Jardiance  Follow-up in 3 months   Patient Instructions  Check blood sugars on waking up 4 days a week  Also check blood sugars about 2 hours after meals and do this after different meals by rotation  Recommended blood sugar levels on waking up are 90-130 and about 2 hours after meal is 130-160  Please bring your blood sugar monitor to each visit, thank you  VITAMIN B12 OTC 1000MG DAILY     Elayne Snare 09/24/2020, 1:04 PM

## 2020-09-24 ENCOUNTER — Other Ambulatory Visit: Payer: Self-pay

## 2020-09-24 ENCOUNTER — Encounter: Payer: Self-pay | Admitting: Endocrinology

## 2020-09-24 ENCOUNTER — Telehealth: Payer: Self-pay | Admitting: *Deleted

## 2020-09-24 ENCOUNTER — Ambulatory Visit (INDEPENDENT_AMBULATORY_CARE_PROVIDER_SITE_OTHER): Payer: Medicare Other | Admitting: Endocrinology

## 2020-09-24 VITALS — BP 138/80 | HR 62 | Ht 69.0 in | Wt 193.0 lb

## 2020-09-24 DIAGNOSIS — E7800 Pure hypercholesterolemia, unspecified: Secondary | ICD-10-CM | POA: Diagnosis not present

## 2020-09-24 DIAGNOSIS — Z794 Long term (current) use of insulin: Secondary | ICD-10-CM | POA: Diagnosis not present

## 2020-09-24 DIAGNOSIS — M1712 Unilateral primary osteoarthritis, left knee: Secondary | ICD-10-CM | POA: Diagnosis not present

## 2020-09-24 DIAGNOSIS — E1165 Type 2 diabetes mellitus with hyperglycemia: Secondary | ICD-10-CM

## 2020-09-24 DIAGNOSIS — M170 Bilateral primary osteoarthritis of knee: Secondary | ICD-10-CM | POA: Diagnosis not present

## 2020-09-24 DIAGNOSIS — E538 Deficiency of other specified B group vitamins: Secondary | ICD-10-CM | POA: Diagnosis not present

## 2020-09-24 LAB — CBC
HCT: 40.9 % (ref 39.0–52.0)
Hemoglobin: 13.8 g/dL (ref 13.0–17.0)
MCHC: 33.6 g/dL (ref 30.0–36.0)
MCV: 98.2 fl (ref 78.0–100.0)
Platelets: 153 10*3/uL (ref 150.0–400.0)
RBC: 4.17 Mil/uL — ABNORMAL LOW (ref 4.22–5.81)
RDW: 13.7 % (ref 11.5–15.5)
WBC: 7.8 10*3/uL (ref 4.0–10.5)

## 2020-09-24 LAB — POCT GLYCOSYLATED HEMOGLOBIN (HGB A1C): Hemoglobin A1C: 7.5 % — AB (ref 4.0–5.6)

## 2020-09-24 LAB — COMPREHENSIVE METABOLIC PANEL
ALT: 21 U/L (ref 0–53)
AST: 18 U/L (ref 0–37)
Albumin: 4.2 g/dL (ref 3.5–5.2)
Alkaline Phosphatase: 84 U/L (ref 39–117)
BUN: 28 mg/dL — ABNORMAL HIGH (ref 6–23)
CO2: 26 mEq/L (ref 19–32)
Calcium: 9.4 mg/dL (ref 8.4–10.5)
Chloride: 106 mEq/L (ref 96–112)
Creatinine, Ser: 1.25 mg/dL (ref 0.40–1.50)
GFR: 51.09 mL/min — ABNORMAL LOW (ref >60.00)
Glucose, Bld: 137 mg/dL — ABNORMAL HIGH (ref 70–99)
Potassium: 4.7 mEq/L (ref 3.5–5.1)
Sodium: 140 mEq/L (ref 135–145)
Total Bilirubin: 0.5 mg/dL (ref 0.2–1.2)
Total Protein: 7.2 g/dL (ref 6.0–8.3)

## 2020-09-24 LAB — LIPID PANEL
Cholesterol: 122 mg/dL (ref 0–200)
HDL: 45.1 mg/dL (ref >39.00)
LDL Cholesterol: 59 mg/dL (ref 0–99)
NonHDL: 76.87
Total CHOL/HDL Ratio: 3
Triglycerides: 90 mg/dL (ref 0.0–149.0)
VLDL: 18 mg/dL (ref 0.0–40.0)

## 2020-09-24 LAB — POCT GLUCOSE (DEVICE FOR HOME USE): POC Glucose: 170 mg/dl — AB (ref 70–99)

## 2020-09-24 NOTE — Telephone Encounter (Signed)
Pt son talked to Linda--since cannot find freestyle libre reader and sensor expire in March 2022, possible can order the new Elenor Legato 2 if possible. Please advise

## 2020-09-24 NOTE — Telephone Encounter (Signed)
He needs to get this from the company supplying the sensor

## 2020-09-24 NOTE — Patient Instructions (Signed)
Check blood sugars on waking up 4 days a week  Also check blood sugars about 2 hours after meals and do this after different meals by rotation  Recommended blood sugar levels on waking up are 90-130 and about 2 hours after meal is 130-160  Please bring your blood sugar monitor to each visit, thank you  VITAMIN B12 OTC 1000MG  DAILY

## 2020-09-25 LAB — VITAMIN B12: Vitamin B-12: 137 pg/mL — ABNORMAL LOW (ref 211–911)

## 2020-09-25 NOTE — Telephone Encounter (Signed)
Faxed complete form freestyle Libre 2 to [phone]

## 2020-09-26 NOTE — Progress Notes (Signed)
His vitamin B12 is low.  Please remind his son to get 1000 mcg of vitamin B12 OTC and start daily, to follow-up with PCP.  Other labs okay including cholesterol

## 2020-10-04 ENCOUNTER — Telehealth: Payer: Self-pay | Admitting: *Deleted

## 2020-10-04 MED ORDER — SIMVASTATIN 40 MG PO TABS: 40.0000 mg | ORAL_TABLET | Freq: Every day | ORAL | 1 refills | Status: DC

## 2020-10-04 MED ORDER — REPAGLINIDE 1 MG PO TABS: 1.0000 mg | ORAL_TABLET | Freq: Three times a day (TID) | ORAL | 1 refills | Status: DC

## 2020-10-04 MED ORDER — MEMANTINE HCL 5 MG PO TABS: 5.0000 mg | ORAL_TABLET | Freq: Two times a day (BID) | ORAL | 1 refills | Status: DC

## 2020-10-04 NOTE — Telephone Encounter (Signed)
-----   Message from Edythe Clarity, [facility] sent at 10/04/2020  9:00 AM EST ----- Hey patient's father called and requested these [facility] to [facility]!  ----- Message ----- From: Edythe Clarity, [facility] Sent: 10/01/2020   3:49 PM EST To: Eden Lathe Francyne Arreaga, LPN  Patient has requested new rx's for 30 days supplies of Memantine 5mg , Simvastatin 40mg , and Repaglinide 1mg  sent to [facility] on [address] while they away shipment from the [place].  Thanks!

## 2020-10-04 NOTE — Telephone Encounter (Signed)
Prescription sent to pharmacy.

## 2020-10-13 ENCOUNTER — Other Ambulatory Visit: Payer: Self-pay | Admitting: Family Medicine

## 2020-10-15 DIAGNOSIS — E118 Type 2 diabetes mellitus with unspecified complications: Secondary | ICD-10-CM | POA: Diagnosis not present

## 2020-10-15 DIAGNOSIS — I10 Essential (primary) hypertension: Secondary | ICD-10-CM | POA: Diagnosis not present

## 2020-10-15 DIAGNOSIS — E7800 Pure hypercholesterolemia, unspecified: Secondary | ICD-10-CM | POA: Diagnosis not present

## 2020-10-29 ENCOUNTER — Telehealth: Payer: Self-pay | Admitting: Family Medicine

## 2020-10-29 ENCOUNTER — Telehealth (INDEPENDENT_AMBULATORY_CARE_PROVIDER_SITE_OTHER): Payer: Medicare Other | Admitting: Nurse Practitioner

## 2020-10-29 ENCOUNTER — Telehealth: Payer: Self-pay | Admitting: Pharmacist

## 2020-10-29 DIAGNOSIS — J019 Acute sinusitis, unspecified: Secondary | ICD-10-CM | POA: Diagnosis not present

## 2020-10-29 MED ORDER — AMOXICILLIN-POT CLAVULANATE 875-125 MG PO TABS: 1.0000 | ORAL_TABLET | Freq: Two times a day (BID) | ORAL | 0 refills | Status: DC

## 2020-10-29 NOTE — Progress Notes (Addendum)
Chronic Care Management Pharmacy Assistant   Name: KRISHAWN VANDERWEELE  MRN: [id number] DOB: Feb 11, 1929  Reason for Encounter: Disease State For DM.   Conditions to be addressed/monitored: hypertension, Type II DM, CKD, hyperlipidemia.  Recent office visits:  09/03/20 Dr. Dennard Schaumann For pressure injury of left buttock. Per note: The doctor discussed offload pressure every 2 hours and encourage ambulation. No medication changes. 07/31/20 Dr. Dennard Schaumann. For Rhinosinusitis. STARTED Amoxicillin-Pot Clavulanate 875-125 mg 1 tablet 2 times daily.  Recent consult visits:  09/24/20 Endocrinology Elayne Snare, MD. For Type 2 DM. Per note: The doctor advised checking blood sugars every 2 hours after meals. INCREASED Empagliflozin to 25 mg daily with breakfast. STOPPED 70/30 insulin. STARTED 20 units of Tresiba daily. 09/24/20 Orthopedics. Earney Mallet, NP. Per note: Xrays of left knee were taken. 08/21/20 Podiatry [name], DPM. For Onychomycosis. Per note:Patient nails were trimmed. No medication changes.   Hospital visits:  None in previous 6 months  Medications: Outpatient Encounter Medications as of 10/29/2020  Medication Sig   amoxicillin-clavulanate (AUGMENTIN) 875-125 MG tablet Take 1 tablet by mouth 2 (two) times daily.   atenolol (TENORMIN) 25 MG tablet TAKE 1 TABLET BY MOUTH IN THE MORNING AND 1/2 TABLET AT BEDTIME   B-D UF III MINI PEN NEEDLES 31G X 5 MM MISC USE 4 TIMES PER DAY   Blood Glucose Monitoring Suppl (ONE TOUCH ULTRA 2) w/Device KIT Use to test blood sugar daily Dx code E11.65   cholecalciferol (VITAMIN D) 1000 UNITS tablet Take 1,000 Units by mouth daily.   docusate sodium (COLACE) 100 MG capsule Take 100 mg by mouth 2 (two) times daily.   donepezil (ARICEPT) 10 MG tablet TAKE 1 TABLET BY MOUTH EVERYDAY AT BEDTIME   empagliflozin (JARDIANCE) 25 MG TABS tablet Take 1 tablet (25 mg total) by mouth daily with breakfast.   ferrous gluconate (FERGON) 325 MG tablet Take 325  mg by mouth daily with breakfast.   fluticasone (FLONASE) 50 MCG/ACT nasal spray PLACE 2 SPRAYS INTO BOTH NOSTRILS DAILY.   Insulin Pen Needle (B-D UF III MINI PEN NEEDLES) 31G X 5 MM MISC USE 4 TIMES PER DAY   Lancet Devices (ONE TOUCH DELICA LANCING DEV) MISC Use to check blood sugar three times daily.   memantine (NAMENDA) 5 MG tablet TAKE 1 TABLET BY MOUTH TWICE A DAY   metFORMIN (GLUCOPHAGE) 1000 MG tablet TAKE 1 TABLET IN THE MORNING & 1/2 TABLET AT DINNERTIME   mometasone (ELOCON) 0.1 % ointment Apply topically daily.   omeprazole (PRILOSEC) 20 MG capsule Take 20 mg by mouth daily.   OneTouch Delica Lancets 29H MISC 1 each by Does not apply route in the morning, at noon, and at bedtime. Use to check blood sugar three times daily. DX:E11.65   ONETOUCH VERIO test strip USE AS INSTRUCTED TO CHECK BLOOD SUGAR THREE TIMES A DAY. DX CODE E11.65   repaglinide (PRANDIN) 1 MG tablet Take 1 tablet (1 mg total) by mouth 3 (three) times daily before meals.   simvastatin (ZOCOR) 40 MG tablet Take 1 tablet (40 mg total) by mouth at bedtime.   TRESIBA FLEXTOUCH 100 UNIT/ML FlexTouch Pen INJECT 20 UNITS TOTAL INTO THE SKIN DAILY.   Facility-Administered Encounter Medications as of 10/29/2020  Medication   cyanocobalamin ((VITAMIN B-12)) injection 1,000 mcg   Recent Relevant Labs: Lab Results  Component Value Date/Time   HGBA1C 7.5 (A) 09/24/2020 11:29 AM   HGBA1C 10.2 (H) 05/30/2020 09:18 AM   HGBA1C 9.7 (H)  09/14/2019 08:11 AM   MICROALBUR 7.9 (H) 05/30/2020 09:18 AM   MICROALBUR 28.4 03/01/2019 12:00 AM    Kidney Function Lab Results  Component Value Date/Time   CREATININE 1.25 09/24/2020 11:52 AM   CREATININE 1.27 05/30/2020 09:18 AM   CREATININE 1.19 (H) 04/09/2017 10:24 AM   CREATININE 1.18 (H) 09/05/2016 10:11 AM   GFR 51.09 (L) 09/24/2020 11:52 AM   GFRNONAA 55 (L) 04/09/2017 10:24 AM   GFRAA 64 04/09/2017 10:24 AM    Current antihyperglycemic regimen:  Tresiba 100 units/ml 16u  daily, Metformin 1000 one tab am and one-half tab at dinner time,  Jardiance 10 mg daily  Repaglinide 1 mg tid  What recent interventions/DTPs have been made to improve glycemic control:  Tresiba 100 units/ml 20u daily, but per the patients son he decided to not go fourth with this change.   Have there been any recent hospitalizations or ED visits since last visit with CPP? Patients son stated no.  Patients son denies hypoglycemic symptoms, including None   Patients son denies hyperglycemic symptoms, including none   How often are you checking your blood sugar? once daily and twice daily   What are your blood sugars ranging? Patients son stated his father blood sugar is usually around 130-180 but its been running high the last couple of days.  During the week, how often does your blood glucose drop below 70? Patients son stated Never   Are you checking your feet daily/regularly?  Patients son stated he will usually lets me know if something was going on with his feet. Adherence Review: Is the patient currently on a STATIN medication? Yes, Simvastatin 40 mg   Is the patient currently on ACE/ARB medication? No.  Does the patient have >5 day gap between last estimated fill dates? Per misc rtps, no.  Star Rating Drugs: Metformin 1000 mg 90 DS 08/17/20, Simvastatin 40 mg 30 DS 10/04/20,  Patient stated he has been trying to get an medication for his father for cold/sinus because he does not want to use an OTC cold medication. He stated OTC medications causes his fathers sugar to rise.Sent a message to the nurse in regards of this.  Follow-Up:Pharmacist Review  Charlann Lange, RMA Clinical Pharmacist Assistant [phone]  10 minutes spent in review, coordination, and documentation.  Reviewed by: Beverly Milch, PharmD Clinical Pharmacist [facility] [phone]

## 2020-10-29 NOTE — Telephone Encounter (Signed)
Call placed to patient son, [name].   Advised appointment will be needed.   Virtual appointment scheduled with NP

## 2020-10-29 NOTE — Progress Notes (Signed)
Subjective:    Patient ID: [name], male    DOB: 01/03/1929, 87 y.o.   MRN: [id number]  HPI: [name] is a 87 y.o. male presenting virtually with son, [name], for sinus infection.  Chief Complaint  Patient presents with  . Sinusitis   UPPER RESPIRATORY TRACT INFECTION Onset: Friday of last week COVID tesitng status: not tested for this occurrence. Worst symptom: Fever: no Cough: yes; congested  Shortness of breath: no Wheezing: no Chest pain: no Chest tightness: no Chest congestion: no Nasal congestion: yes Runny nose: yes Post nasal drip: yes Sneezing: yes Sore throat: no Swollen glands: no Sinus pressure: yes Headache: yes Face pain: no Toothache: no Ear pain: no  Ear pressure: no  Eyes red/itching:no Eye drainage/crusting: no  Nausea: no  Vomiting: no  Diarrhea: no  Change in appetite: no  Loss of taste/smell: no  Rash: no Fatigue: no Sick contacts: no Strep contacts: no  Context: worse Recurrent sinusitis: no Treatments attempted: yes - all OTC give bad reactions Relief with OTC medications: no; hallucinates and blood sugar spikes  Allergies  Allergen Reactions  . Avodart [Dutasteride] Swelling  . Ibuprofen Nausea And Vomiting  . Morphine Nausea And Vomiting    Outpatient Encounter Medications as of 10/29/2020  Medication Sig  . amoxicillin-clavulanate (AUGMENTIN) 875-125 MG tablet Take 1 tablet by mouth 2 (two) times daily.  Marland Kitchen atenolol (TENORMIN) 25 MG tablet TAKE 1 TABLET BY MOUTH IN THE MORNING AND 1/2 TABLET AT BEDTIME  . B-D UF III MINI PEN NEEDLES 31G X 5 MM MISC USE 4 TIMES PER DAY  . Blood Glucose Monitoring Suppl (ONE TOUCH ULTRA 2) w/Device KIT Use to test blood sugar daily Dx code E11.65  . cholecalciferol (VITAMIN D) 1000 UNITS tablet Take 1,000 Units by mouth daily.  Marland Kitchen docusate sodium (COLACE) 100 MG capsule Take 100 mg by mouth 2 (two) times daily.  Marland Kitchen donepezil (ARICEPT) 10 MG tablet TAKE 1 TABLET BY MOUTH EVERYDAY  AT BEDTIME  . empagliflozin (JARDIANCE) 25 MG TABS tablet Take 1 tablet (25 mg total) by mouth daily with breakfast.  . ferrous gluconate (FERGON) 325 MG tablet Take 325 mg by mouth daily with breakfast.  . fluticasone (FLONASE) 50 MCG/ACT nasal spray PLACE 2 SPRAYS INTO BOTH NOSTRILS DAILY.  Marland Kitchen Insulin Pen Needle (B-D UF III MINI PEN NEEDLES) 31G X 5 MM MISC USE 4 TIMES PER DAY  . Lancet Devices (ONE TOUCH DELICA LANCING DEV) MISC Use to check blood sugar three times daily.  . memantine (NAMENDA) 5 MG tablet TAKE 1 TABLET BY MOUTH TWICE A DAY  . metFORMIN (GLUCOPHAGE) 1000 MG tablet TAKE 1 TABLET IN THE MORNING & 1/2 TABLET AT DINNERTIME  . mometasone (ELOCON) 0.1 % ointment Apply topically daily.  Marland Kitchen omeprazole (PRILOSEC) 20 MG capsule Take 20 mg by mouth daily.  Glory Rosebush Delica Lancets 54D MISC 1 each by Does not apply route in the morning, at noon, and at bedtime. Use to check blood sugar three times daily. DX:E11.65  . ONETOUCH VERIO test strip USE AS INSTRUCTED TO CHECK BLOOD SUGAR THREE TIMES A DAY. DX CODE E11.65  . repaglinide (PRANDIN) 1 MG tablet Take 1 tablet (1 mg total) by mouth 3 (three) times daily before meals.  . simvastatin (ZOCOR) 40 MG tablet Take 1 tablet (40 mg total) by mouth at bedtime.  . TRESIBA FLEXTOUCH 100 UNIT/ML FlexTouch Pen INJECT 20 UNITS TOTAL INTO THE SKIN DAILY.  . [DISCONTINUED] amoxicillin-clavulanate (AUGMENTIN)  875-125 MG tablet Take 1 tablet by mouth 2 (two) times daily.   Facility-Administered Encounter Medications as of 10/29/2020  Medication  . cyanocobalamin ((VITAMIN B-12)) injection 1,000 mcg    Patient Active Problem List   Diagnosis Date Noted  . Pain due to onychomycosis of toenails of both feet 01/24/2019  . Diabetes mellitus without complication ([place]) 73/71/0626  . Type 2 diabetes mellitus without complication ([place]) 94/85/4627  . PAC (premature atrial contraction) 05/21/2015  . S/P TURP (status post transurethral resection of prostate)  11/16/2013  . UTI (urinary tract infection) 11/16/2013  . Aortic sclerosis 11/16/2013  . Chronic kidney disease, stage II (mild) 08/02/2013  . Hypertension   . Hyperlipidemia   . B12 deficiency   . Type II or unspecified type diabetes mellitus without mention of complication, uncontrolled 03/01/2013  . Pure hypercholesterolemia 03/01/2013  . BPH (benign prostatic hypertrophy) 10/21/2010  . Rhinitis 10/21/2010  . History of kidney stones 10/21/2010  . UNSPECIFIED ANEMIA 03/26/2009  . NONSPECIFIC ABNORMAL FINDING IN STOOL CONTENTS 03/26/2009  . PERSONAL HISTORY OF COLONIC POLYPS 03/26/2009    Past Medical History:  Diagnosis Date  . B12 deficiency   . BPH (benign prostatic hypertrophy)   . Colon polyps   . Diabetes mellitus   . GERD (gastroesophageal reflux disease)    hiatal hernia  . History of kidney stones   . Hyperlipidemia   . Hypertension   . Osteoarthritis     Relevant past medical, surgical, family and social history reviewed and updated as indicated. Interim medical history since our last visit reviewed.  Review of Systems Per HPI unless specifically indicated above     Objective:    There were no vitals taken for this visit.  Wt Readings from Last 3 Encounters:  09/24/20 193 lb (87.5 kg)  09/03/20 210 lb (95.3 kg)  07/31/20 198 lb (89.8 kg)    Physical Exam Physical examination unable to be performed due to lack of equipment.     Assessment & Plan:  1. Acute non-recurrent sinusitis, unspecified location Acute.  Discussed that symptoms are likely viral and time will be of the most benefit for patient.  However, given poor response to over-the-counters, will start course of Augmentin for presumed sinusitis.  Return to clinic if symptoms persist >1 week.  - amoxicillin-clavulanate (AUGMENTIN) 875-125 MG tablet; Take 1 tablet by mouth 2 (two) times daily.  Dispense: 20 tablet; Refill: 0    Follow up plan: Return if symptoms worsen or fail to  improve.  This visit was completed via telephone due to the restrictions of the COVID-19 pandemic. All issues as above were discussed and addressed but no physical exam was performed. If it was felt that the patient should be evaluated in the office, they were directed there. The patient verbally consented to this visit. Patient was unable to complete an audio/visual visit due to Lack of equipment. . Location of the patient: home . Location of the provider: work . Those involved with this call:  . Provider: Noemi Chapel, DNP, FNP-C . CMA: Christina Six, LPN . Front Desk/Registration: n/a  . Time spent on call: 7 minutes on the phone discussing health concerns. 10 minutes total spent in review of patient's record and preparation of their chart.  I verified patient identity using two factors (patient name and date of birth). Patient consents verbally to being seen via telemedicine visit today.

## 2020-10-29 NOTE — Telephone Encounter (Signed)
Received call from son Dellis Filbert. Patient has a severe head cold and congestion. Dellis Filbert requesting same Rx received last visit; otc meds causing high blood sugar level. Dellis Filbert has questions about side effects of naproxen. Please advise at [phone].

## 2020-11-05 ENCOUNTER — Telehealth: Payer: Self-pay | Admitting: Endocrinology

## 2020-11-05 NOTE — Telephone Encounter (Signed)
Received forms [employer] today for diabetic foot wear.

## 2020-11-05 NOTE — Telephone Encounter (Signed)
[employer] called to see if we received a fax that was sent today (an hr ago) for diabetic footwear for pt. I advised they had the correct fax number, however we cannot confirm if it has been received as we do not go back and check. They state it just needs a Dr signature and to please be faxed back to them.

## 2020-11-07 NOTE — Telephone Encounter (Signed)
Forms waiting to be completed by Dr. Dwyane Dee

## 2020-11-07 NOTE — Telephone Encounter (Signed)
Sunday Spillers from [employer] called about the forms and making sure we received the fax. 2 pages needed signed by provider

## 2020-11-08 DIAGNOSIS — E7800 Pure hypercholesterolemia, unspecified: Secondary | ICD-10-CM | POA: Diagnosis not present

## 2020-11-08 DIAGNOSIS — E118 Type 2 diabetes mellitus with unspecified complications: Secondary | ICD-10-CM | POA: Diagnosis not present

## 2020-11-08 DIAGNOSIS — I10 Essential (primary) hypertension: Secondary | ICD-10-CM | POA: Diagnosis not present

## 2020-11-10 DIAGNOSIS — E118 Type 2 diabetes mellitus with unspecified complications: Secondary | ICD-10-CM | POA: Diagnosis not present

## 2020-11-12 ENCOUNTER — Telehealth: Payer: Self-pay | Admitting: Family Medicine

## 2020-11-12 NOTE — Telephone Encounter (Signed)
We do not order braces at this office. Return fax is at the front desk.   If brace is needed, then OV and referral to ortho can be placed.

## 2020-11-12 NOTE — Telephone Encounter (Signed)
Received call from Iona Beard from [employer] ([employer]) to follow up on paperwork recently submitted; still hasn't received reply. Please advise at [phone].

## 2020-11-14 DIAGNOSIS — E7800 Pure hypercholesterolemia, unspecified: Secondary | ICD-10-CM | POA: Diagnosis not present

## 2020-11-14 DIAGNOSIS — I10 Essential (primary) hypertension: Secondary | ICD-10-CM | POA: Diagnosis not present

## 2020-11-14 DIAGNOSIS — E118 Type 2 diabetes mellitus with unspecified complications: Secondary | ICD-10-CM | POA: Diagnosis not present

## 2020-11-15 NOTE — Telephone Encounter (Signed)
Left voice mail messages for patient [name] to advise.

## 2020-11-20 ENCOUNTER — Ambulatory Visit (INDEPENDENT_AMBULATORY_CARE_PROVIDER_SITE_OTHER): Payer: Medicare Other | Admitting: Podiatry

## 2020-11-20 ENCOUNTER — Other Ambulatory Visit: Payer: Self-pay

## 2020-11-20 DIAGNOSIS — B351 Tinea unguium: Secondary | ICD-10-CM

## 2020-11-20 DIAGNOSIS — M79674 Pain in right toe(s): Secondary | ICD-10-CM

## 2020-11-20 DIAGNOSIS — E119 Type 2 diabetes mellitus without complications: Secondary | ICD-10-CM | POA: Diagnosis not present

## 2020-11-20 DIAGNOSIS — M79675 Pain in left toe(s): Secondary | ICD-10-CM

## 2020-11-20 NOTE — Progress Notes (Signed)
  Subjective:  Patient ID: [name], male    DOB: [birth year],  MRN: [id number]  Chief Complaint  Patient presents with  . Nail Problem    3 month RFC Nail trim    87 y.o. male presents with the above complaint. History confirmed with patient.  Objective:  Physical Exam: warm, good capillary refill, nail exam onychomycosis of the toenails and pain to palpation, no trophic changes or ulcerative lesions. DP pulses palpable, PT pulses palpable and protective sensation intact Left Foot: HPK noted at distal and plantar hallux stump without warmth, erythema, signs of acute infection noted. No open ulcer upon debridement. Right Foot: HPK right heel   No images are attached to the encounter.  Assessment:   1. Pain due to onychomycosis of toenails of both feet   2. Diabetes mellitus without complication ([place])      Plan:  Patient was evaluated and treated and all questions answered.  Onychomycosis -Nails palliatively debrided secondary to pain   Procedure: Nail Debridement Type of Debridement: manual, sharp debridement. Instrumentation: Nail nipper, rotary burr. Number of Nails: 10  Return in about 4 months (around 03/22/2021) for Diabetic Foot Care.

## 2020-11-28 ENCOUNTER — Telehealth: Payer: Self-pay | Admitting: Pharmacist

## 2020-11-28 NOTE — Progress Notes (Addendum)
Chronic Care Management Pharmacy Assistant   Name: [name]  MRN: [id number] DOB: November 19, 1928  Reason for Encounter: General Disease State Call   Conditions to be addressed/monitored: hypertension, Type II DM, CKD, hyperlipidemia.  Recent office visits:  10/29/20 [name] Bear, NP. For acute nonrecurring sinusitis  STARTED Amoxicillin-Clavulanate 875-125 mg 1 tablet by mouth 2 times daily.   Recent consult visits:  11/20/20 Podiatry [name], DPM. For pain due to onychomycosis. No medication changes.   Hospital visits:  None in previous 6 months  Medications: Outpatient Encounter Medications as of 11/28/2020  Medication Sig   amoxicillin-clavulanate (AUGMENTIN) 875-125 MG tablet Take 1 tablet by mouth 2 (two) times daily.   aspirin 81 MG EC tablet Take 1 tablet by mouth daily.   atenolol (TENORMIN) 25 MG tablet TAKE 1 TABLET BY MOUTH IN THE MORNING AND 1/2 TABLET AT BEDTIME   B-D UF III MINI PEN NEEDLES 31G X 5 MM MISC USE 4 TIMES PER DAY   Blood Glucose Monitoring Suppl (ONE TOUCH ULTRA 2) w/Device KIT Use to test blood sugar daily Dx code E11.65   cephALEXin (KEFLEX) 500 MG capsule cephalexin 500 mg capsule  TAKE 1 CAPSULE BY MOUTH THREE TIMES A DAY   cholecalciferol (VITAMIN D) 1000 UNITS tablet Take 1,000 Units by mouth daily.   docusate sodium (COLACE) 100 MG capsule Take 100 mg by mouth 2 (two) times daily.   donepezil (ARICEPT) 10 MG tablet TAKE 1 TABLET BY MOUTH EVERYDAY AT BEDTIME   empagliflozin (JARDIANCE) 25 MG TABS tablet Take 1 tablet (25 mg total) by mouth daily with breakfast.   ferrous gluconate (FERGON) 325 MG tablet Take 325 mg by mouth daily with breakfast.   fluticasone (FLONASE) 50 MCG/ACT nasal spray PLACE 2 SPRAYS INTO BOTH NOSTRILS DAILY.   glimepiride (AMARYL) 4 MG tablet glimepiride 4 mg tablet  TAKE 1 TABLET BY MOUTH DAILY BEFORE SUPPER   Insulin Pen Needle (B-D UF III MINI PEN NEEDLES) 31G X 5 MM MISC USE 4 TIMES PER DAY    Lancet Devices (ONE TOUCH DELICA LANCING DEV) MISC Use to check blood sugar three times daily.   memantine (NAMENDA) 5 MG tablet TAKE 1 TABLET BY MOUTH TWICE A DAY   metFORMIN (GLUCOPHAGE) 1000 MG tablet TAKE 1 TABLET IN THE MORNING & 1/2 TABLET AT DINNERTIME   mometasone (ELOCON) 0.1 % ointment Apply topically daily.   omeprazole (PRILOSEC) 20 MG capsule Take 20 mg by mouth daily.   OneTouch Delica Lancets 01U MISC 1 each by Does not apply route in the morning, at noon, and at bedtime. Use to check blood sugar three times daily. DX:E11.65   ONETOUCH VERIO test strip USE AS INSTRUCTED TO CHECK BLOOD SUGAR THREE TIMES A DAY. DX CODE E11.65   repaglinide (PRANDIN) 1 MG tablet Take 1 tablet (1 mg total) by mouth 3 (three) times daily before meals.   simvastatin (ZOCOR) 40 MG tablet Take 1 tablet (40 mg total) by mouth at bedtime.   TRESIBA FLEXTOUCH 100 UNIT/ML FlexTouch Pen INJECT 20 UNITS TOTAL INTO THE SKIN DAILY.   Facility-Administered Encounter Medications as of 11/28/2020  Medication   cyanocobalamin ((VITAMIN B-12)) injection 1,000 mcg    GEN CALL: Patients son stated his father is doing well. He stated everything is the same with his activity level and food intake. He stated he does not have any questions about his fathers medications at this time. Patients son asked for [place] to try to call a  little less.  Star Rating Drugs: Metformin 1000 mg 90 DS 08/17/20, Simvastatin 40 mg 30 DS 10/04/20,   Follow-Up:Pharmacist Review  [name], RMA Clinical Pharmacist Assistant [phone]  5 minutes spent in review, coordination, and documentation.  Reviewed by: [name], PharmD Clinical Pharmacist [facility] [phone]

## 2020-11-29 ENCOUNTER — Telehealth: Payer: Self-pay | Admitting: Family Medicine

## 2020-11-29 NOTE — Telephone Encounter (Signed)
Left message for patient to call back and schedule Medicare Annual Wellness Visit (AWV) in office.   If not able to come in office, please offer to do virtually or by telephone.   Last AWV:09/09/2016  Please schedule at anytime with [facility] Health Advisor.  If any questions, please contact me at [phone]

## 2020-12-14 DIAGNOSIS — I10 Essential (primary) hypertension: Secondary | ICD-10-CM | POA: Diagnosis not present

## 2020-12-14 DIAGNOSIS — E7800 Pure hypercholesterolemia, unspecified: Secondary | ICD-10-CM | POA: Diagnosis not present

## 2020-12-14 DIAGNOSIS — E118 Type 2 diabetes mellitus with unspecified complications: Secondary | ICD-10-CM | POA: Diagnosis not present

## 2020-12-31 ENCOUNTER — Other Ambulatory Visit: Payer: Self-pay | Admitting: Endocrinology

## 2021-01-01 ENCOUNTER — Encounter: Payer: Self-pay | Admitting: Endocrinology

## 2021-01-01 ENCOUNTER — Ambulatory Visit (INDEPENDENT_AMBULATORY_CARE_PROVIDER_SITE_OTHER): Payer: Medicare Other | Admitting: Endocrinology

## 2021-01-01 ENCOUNTER — Other Ambulatory Visit: Payer: Self-pay

## 2021-01-01 VITALS — BP 136/70 | HR 61 | Ht 69.0 in | Wt 196.2 lb

## 2021-01-01 DIAGNOSIS — Z794 Long term (current) use of insulin: Secondary | ICD-10-CM | POA: Diagnosis not present

## 2021-01-01 DIAGNOSIS — E1165 Type 2 diabetes mellitus with hyperglycemia: Secondary | ICD-10-CM | POA: Diagnosis not present

## 2021-01-01 DIAGNOSIS — I10 Essential (primary) hypertension: Secondary | ICD-10-CM | POA: Diagnosis not present

## 2021-01-01 DIAGNOSIS — E538 Deficiency of other specified B group vitamins: Secondary | ICD-10-CM

## 2021-01-01 LAB — POCT GLYCOSYLATED HEMOGLOBIN (HGB A1C): Hemoglobin A1C: 8.1 % — AB (ref 4.0–5.6)

## 2021-01-01 LAB — COMPREHENSIVE METABOLIC PANEL
ALT: 21 U/L (ref 0–53)
AST: 20 U/L (ref 0–37)
Albumin: 4.2 g/dL (ref 3.5–5.2)
Alkaline Phosphatase: 97 U/L (ref 39–117)
BUN: 31 mg/dL — ABNORMAL HIGH (ref 6–23)
CO2: 25 mEq/L (ref 19–32)
Calcium: 9.4 mg/dL (ref 8.4–10.5)
Chloride: 106 mEq/L (ref 96–112)
Creatinine, Ser: 1.25 mg/dL (ref 0.40–1.50)
GFR: 50.99 mL/min — ABNORMAL LOW (ref >60.00)
Glucose, Bld: 207 mg/dL — ABNORMAL HIGH (ref 70–99)
Potassium: 4.5 mEq/L (ref 3.5–5.1)
Sodium: 141 mEq/L (ref 135–145)
Total Bilirubin: 0.6 mg/dL (ref 0.2–1.2)
Total Protein: 7 g/dL (ref 6.0–8.3)

## 2021-01-01 LAB — VITAMIN B12: Vitamin B-12: 676 pg/mL (ref 211–911)

## 2021-01-01 NOTE — Patient Instructions (Addendum)
Vitamin B 12 1000ug daily

## 2021-01-01 NOTE — Progress Notes (Signed)
Please call to let patient's son know that the lab results are normal and he can continue the same dose of vitamin B12

## 2021-01-01 NOTE — Progress Notes (Signed)
Patient ID: [name], male   DOB: [birth year], 87 y.o.   MRN: [id number]   Reason for Appointment: Diabetes follow-up   History of Present Illness   Diagnosis: Type 2 DIABETES MELITUS, diagnoses 1972   He has had long-standing diabetes and has been on basal bolus insulin regimen after failure of oral hypoglycemic drugs over the last several years His blood sugars have been generally fairly well controlled but has a tendency to high postprandial readings Previously required only a small dose of 5 units of Levemir insulin  Taking Amaryl in addition to his insulin regimen  his blood sugars had improved his blood sugars during the daytime  He had taken Toujeo since 9/16 instead of twice a day Levemir  RECENT history:   Insulin regimen:  Tresiba 16 units daily Oral hypoglycemic drugs: Metformin 1500 mg , Jardiance 25 mg daily, Prandin 1 mg before each meal  A1c is about the same at 8.1 compared to 7.5   Current management, blood sugar patterns and problems identified  His blood sugars are now being assessed for the freestyle libre but he is checking blood sugars only inconsistently and mostly in the mornings; sensor only active 33% of the time  With this his morning sugars are AVERAGING 126 but he has not checked his libre compared to the fingersticks  No hypoglycemia reported and only once his blood sugar appears to be below 70 overnight  Blood sugars later in the day are assessed only once or twice and these appear to be periodically higher  Did have a significant increase in blood sugar after breakfast once  His son  thinks he had significantly high readings once from missing insulin dose once  He is doing a little walking outside but not any formal exercise  Weight is up 3 pounds  Compliance with his medications especially Prandin is difficult to assess    Side effects from medications: None          Proper timing of insulin in relation to meals: Yes.                     Blood Glucose readings as above checked by freestyle libre AVERAGE 167 overall with mostly overnight blood sugars assessed which are highly variable  PREVIOUS average fasting readings 209  Meals: 3 meals per day. Breakfast at 8 am Lunch 12 noon Supper at 5-6 pm; breakfast is usually oatmeal with eggs.  Drinks water mostly and no sweet drinks      Wt Readings from Last 3 Encounters:  01/01/21 196 lb 4 oz (89 kg)  09/24/20 193 lb (87.5 kg)  09/03/20 210 lb (95.3 kg)   Lab Results  Component Value Date   HGBA1C 8.1 (A) 01/01/2021   HGBA1C 7.5 (A) 09/24/2020   HGBA1C 10.2 (H) 05/30/2020   Lab Results  Component Value Date   MICROALBUR 7.9 (H) 05/30/2020   LDLCALC 59 09/24/2020   CREATININE 1.25 09/24/2020     Allergies as of 01/01/2021      Reactions   Avodart [dutasteride] Swelling   Ibuprofen Nausea And Vomiting   Morphine Nausea And Vomiting      Medication List       Accurate as of January 01, 2021  9:32 AM. If you have any questions, ask your nurse or doctor.        amoxicillin-clavulanate 875-125 MG tablet Commonly known as: AUGMENTIN Take 1 tablet by  mouth 2 (two) times daily.   aspirin 81 MG EC tablet Take 1 tablet by mouth daily.   atenolol 25 MG tablet Commonly known as: TENORMIN TAKE 1 TABLET BY MOUTH IN THE MORNING AND 1/2 TABLET AT BEDTIME   B-D UF III MINI PEN NEEDLES 31G X 5 MM Misc Generic drug: Insulin Pen Needle USE 4 TIMES PER DAY   cephALEXin 500 MG capsule Commonly known as: KEFLEX cephalexin 500 mg capsule  TAKE 1 CAPSULE BY MOUTH THREE TIMES A DAY   cholecalciferol 1000 units tablet Commonly known as: VITAMIN D Take 1,000 Units by mouth daily.   docusate sodium 100 MG capsule Commonly known as: COLACE Take 100 mg by mouth 2 (two) times daily.   donepezil 10 MG tablet Commonly known as: ARICEPT TAKE 1 TABLET BY MOUTH EVERYDAY AT BEDTIME   empagliflozin 25 MG Tabs tablet Commonly known as: Jardiance Take 1  tablet (25 mg total) by mouth daily with breakfast.   ferrous gluconate 325 MG tablet Commonly known as: FERGON Take 325 mg by mouth daily with breakfast.   fluticasone 50 MCG/ACT nasal spray Commonly known as: FLONASE PLACE 2 SPRAYS INTO BOTH NOSTRILS DAILY.   glimepiride 4 MG tablet Commonly known as: AMARYL glimepiride 4 mg tablet  TAKE 1 TABLET BY MOUTH DAILY BEFORE SUPPER   memantine 5 MG tablet Commonly known as: NAMENDA TAKE 1 TABLET BY MOUTH TWICE A DAY   metFORMIN 1000 MG tablet Commonly known as: GLUCOPHAGE TAKE 1 TABLET IN THE MORNING & 1/2 TABLET AT DINNERTIME   mometasone 0.1 % ointment Commonly known as: Elocon Apply topically daily.   omeprazole 20 MG capsule Commonly known as: PRILOSEC Take 20 mg by mouth daily.   ONE TOUCH DELICA LANCING DEV Misc Use to check blood sugar three times daily.   ONE TOUCH ULTRA 2 w/Device Kit Use to test blood sugar daily Dx code B09.62   OneTouch Delica Lancets 83M Misc 1 each by Does not apply route in the morning, at noon, and at bedtime. Use to check blood sugar three times daily. DX:E11.65   OneTouch Verio test strip Generic drug: glucose blood USE AS INSTRUCTED TO CHECK BLOOD SUGAR THREE TIMES A DAY. DX CODE E11.65   repaglinide 1 MG tablet Commonly known as: PRANDIN Take 1 tablet (1 mg total) by mouth 3 (three) times daily before meals.   simvastatin 40 MG tablet Commonly known as: ZOCOR Take 1 tablet (40 mg total) by mouth at bedtime.   Tyler Aas FlexTouch 100 UNIT/ML FlexTouch Pen Generic drug: insulin degludec INJECT 20 UNITS TOTAL INTO THE SKIN DAILY.       Allergies:  Allergies  Allergen Reactions  . Avodart [Dutasteride] Swelling  . Ibuprofen Nausea And Vomiting  . Morphine Nausea And Vomiting    Past Medical History:  Diagnosis Date  . B12 deficiency   . BPH (benign prostatic hypertrophy)   . Colon polyps   . Diabetes mellitus   . GERD (gastroesophageal reflux disease)    hiatal  hernia  . History of kidney stones   . Hyperlipidemia   . Hypertension   . Osteoarthritis     Past Surgical History:  Procedure Laterality Date  . APPENDECTOMY    . CARDIOVASCULAR STRESS TEST  09/02/2011   Post stress myocardial perfusion images show normal pattern of perfusion in all areas. No significant wall abnormalites noted.  Marland Kitchen CAROTID DOPPLER  01/27/2011   Right & Left ICAs 0-49% diameter reduction, right & left subclavian arteries demonstrated  less than 50% diameter reduction.  . CYSTOSCOPY WITH BIOPSY  06/17/2012   Procedure: CYSTOSCOPY WITH BIOPSY;  Surgeon: Molli Hazard, MD;  Location: [facility];  Service: Urology;  Laterality: N/A;  . KNEE CARTILAGE SURGERY     Arthroscope  . Partial amputaion left foot    . TRANSTHORACIC ECHOCARDIOGRAM  09/02/2011   EF >55%, normal LV systolic function, mild diastolic dysfunction  . TRANSURETHRAL RESECTION OF PROSTATE  06/17/2012   Procedure: TRANSURETHRAL RESECTION OF THE PROSTATE WITH GYRUS INSTRUMENTS;  Surgeon: Molli Hazard, MD;  Location: [facility];  Service: Urology;  Laterality: N/A;    Family History  Problem Relation Age of Onset  . Heart attack Mother   . Hypertension Mother     Social History:  reports that he has quit smoking. He has never used smokeless tobacco. He reports that he does not drink alcohol and does not use drugs.  Review of Systems:  HYPERTENSION:   followed by PCP Taking atenolol only Also is on Jardiance   BP Readings from Last 3 Encounters:  01/01/21 136/70  09/24/20 138/80  09/03/20 118/78   Renal function:  Lab Results  Component Value Date   CREATININE 1.25 09/24/2020   CREATININE 1.27 05/30/2020   CREATININE 1.37 09/14/2019     HYPERLIPIDEMIA:   Treated with 40 mg simvastatin This was restarted on the last visit since he may not have been taking it LDL has been higher in 2021   Lab Results  Component Value Date   CHOL 122 09/24/2020   CHOL 177 05/30/2020   CHOL 175  09/14/2019   Lab Results  Component Value Date   HDL 45.10 09/24/2020   HDL 49.90 05/30/2020   HDL 55.00 09/14/2019   Lab Results  Component Value Date   LDLCALC 59 09/24/2020   LDLCALC 113 (H) 05/30/2020   LDLCALC 109 (H) 09/14/2019   Lab Results  Component Value Date   TRIG 90.0 09/24/2020   TRIG 72.0 05/30/2020   TRIG 57.0 09/14/2019   Lab Results  Component Value Date   CHOLHDL 3 09/24/2020   CHOLHDL 4 05/30/2020   CHOLHDL 3 09/14/2019   Lab Results  Component Value Date   LDLDIRECT 89.0 06/20/2019    Foot care:  foot exam in 10/21 with podiatrist with the following findings:  Vascular: dorsalis pedis and posterior tibial pulses are palpable bilateral. Capillary return is immediate. Temperature gradient is WNL. Skin turgor WNL  Sensorium: Normal Semmes Weinstein monofilament test. Normal tactile sensation bilaterally.  Vitamin B12 deficiency: His last level was 139 and not clear what dose of vitamin B12 he is getting, this was restarted on his last visit   Examination:   BP 136/70   Pulse 61   Ht _0  (1.753 m)   Wt 196 lb 4 oz (89 kg)   SpO2 99%   BMI 28.98 kg/m   Body mass index is 28.98 kg/m.    ASSESSMENT/ PLAN:    Diabetes type 2 insulin requiring  See history of present illness for detailed discussion of his current management, blood sugar patterns and problems identified  His A1c is slightly higher at 8.1  Blood sugars are relatively better with the higher doses of Jardiance, adding Prandin and possibly better diet His weight is leveling off and slightly improved, previously losing weight    Recommendations today:   No change in oral medications or Tresiba dose of 16 units  His son will try to get his blood sugar check  more often later in the day  To call if he has any hypoglycemia  Will check his labs sugar today and compared with his office reading of 175 at the time of his lab draw  History of mild hypertension:  Well-controlled  B12 deficiency possibly from metformin: Likely needs at least 1000 mcg of vitamin B12 and his son will check on the dose available Labs to be checked also  Follow-up in 4 months   There are no Patient Instructions on file for this visit.    Elayne Snare 01/01/2021, 9:32 AM   Addendum: Lab glucose was 207 compared to the office reading of 175 on the freestyle libre

## 2021-01-09 NOTE — Telephone Encounter (Signed)
[name] from [employer] is calling for a medical detailed written order for pt for the free style libre 2 Fax to [facility] [phone]

## 2021-01-13 DIAGNOSIS — I10 Essential (primary) hypertension: Secondary | ICD-10-CM | POA: Diagnosis not present

## 2021-01-13 DIAGNOSIS — E7800 Pure hypercholesterolemia, unspecified: Secondary | ICD-10-CM | POA: Diagnosis not present

## 2021-01-13 DIAGNOSIS — E118 Type 2 diabetes mellitus with unspecified complications: Secondary | ICD-10-CM | POA: Diagnosis not present

## 2021-01-14 NOTE — Telephone Encounter (Signed)
Faxed written order [employer] [phone]

## 2021-01-24 ENCOUNTER — Other Ambulatory Visit: Payer: Self-pay | Admitting: Family Medicine

## 2021-01-31 DIAGNOSIS — Z20822 Contact with and (suspected) exposure to covid-19: Secondary | ICD-10-CM | POA: Diagnosis not present

## 2021-02-11 DIAGNOSIS — Z20822 Contact with and (suspected) exposure to covid-19: Secondary | ICD-10-CM | POA: Diagnosis not present

## 2021-02-12 DIAGNOSIS — E7800 Pure hypercholesterolemia, unspecified: Secondary | ICD-10-CM | POA: Diagnosis not present

## 2021-02-12 DIAGNOSIS — I10 Essential (primary) hypertension: Secondary | ICD-10-CM | POA: Diagnosis not present

## 2021-02-12 DIAGNOSIS — E118 Type 2 diabetes mellitus with unspecified complications: Secondary | ICD-10-CM | POA: Diagnosis not present

## 2021-02-20 DIAGNOSIS — E7800 Pure hypercholesterolemia, unspecified: Secondary | ICD-10-CM | POA: Diagnosis not present

## 2021-02-20 DIAGNOSIS — E118 Type 2 diabetes mellitus with unspecified complications: Secondary | ICD-10-CM | POA: Diagnosis not present

## 2021-02-20 DIAGNOSIS — I10 Essential (primary) hypertension: Secondary | ICD-10-CM | POA: Diagnosis not present

## 2021-02-21 DIAGNOSIS — E118 Type 2 diabetes mellitus with unspecified complications: Secondary | ICD-10-CM | POA: Diagnosis not present

## 2021-02-21 DIAGNOSIS — I10 Essential (primary) hypertension: Secondary | ICD-10-CM | POA: Diagnosis not present

## 2021-02-21 DIAGNOSIS — E7800 Pure hypercholesterolemia, unspecified: Secondary | ICD-10-CM | POA: Diagnosis not present

## 2021-02-25 DIAGNOSIS — Z20822 Contact with and (suspected) exposure to covid-19: Secondary | ICD-10-CM | POA: Diagnosis not present

## 2021-03-17 ENCOUNTER — Encounter: Payer: Self-pay | Admitting: Endocrinology

## 2021-03-18 DIAGNOSIS — I10 Essential (primary) hypertension: Secondary | ICD-10-CM | POA: Diagnosis not present

## 2021-03-18 DIAGNOSIS — E7800 Pure hypercholesterolemia, unspecified: Secondary | ICD-10-CM | POA: Diagnosis not present

## 2021-03-18 DIAGNOSIS — E118 Type 2 diabetes mellitus with unspecified complications: Secondary | ICD-10-CM | POA: Diagnosis not present

## 2021-03-20 DIAGNOSIS — E7800 Pure hypercholesterolemia, unspecified: Secondary | ICD-10-CM | POA: Diagnosis not present

## 2021-03-20 DIAGNOSIS — E118 Type 2 diabetes mellitus with unspecified complications: Secondary | ICD-10-CM | POA: Diagnosis not present

## 2021-03-20 DIAGNOSIS — I10 Essential (primary) hypertension: Secondary | ICD-10-CM | POA: Diagnosis not present

## 2021-03-24 DIAGNOSIS — E7800 Pure hypercholesterolemia, unspecified: Secondary | ICD-10-CM | POA: Diagnosis not present

## 2021-03-24 DIAGNOSIS — E118 Type 2 diabetes mellitus with unspecified complications: Secondary | ICD-10-CM | POA: Diagnosis not present

## 2021-03-24 DIAGNOSIS — I10 Essential (primary) hypertension: Secondary | ICD-10-CM | POA: Diagnosis not present

## 2021-03-26 ENCOUNTER — Ambulatory Visit: Payer: Medicare Other | Admitting: Podiatry

## 2021-04-08 DIAGNOSIS — Z20822 Contact with and (suspected) exposure to covid-19: Secondary | ICD-10-CM | POA: Diagnosis not present

## 2021-04-10 DIAGNOSIS — Z20822 Contact with and (suspected) exposure to covid-19: Secondary | ICD-10-CM | POA: Diagnosis not present

## 2021-04-11 ENCOUNTER — Telehealth: Payer: Self-pay | Admitting: Pharmacist

## 2021-04-11 NOTE — Progress Notes (Addendum)
    Chronic Care Management Pharmacy Assistant   Name: [name]  MRN: [id number] DOB: 1929-02-28  Reason for Encounter: General Disease State Call   Conditions to be addressed/monitored: hypertension, Type II DM, CKD, hyperlipidemia.  Recent office visits:  None since 11/28/20  Recent consult visits:  04/16/21 Podiatry [name], DPM. For nail problems. No medication changes.  7/42/59 Jolene Provost For follow-up. No information given. 01/01/21 Endocrinology Elayne Snare, MD. For follow-up. No medication changes.  5/63/87 Jolene Provost For follow-up. No information given.  Hospital visits:  None since 11/28/20  Medications: Outpatient Encounter Medications as of 04/11/2021  Medication Sig   amoxicillin-clavulanate (AUGMENTIN) 875-125 MG tablet Take 1 tablet by mouth 2 (two) times daily.   aspirin 81 MG EC tablet Take 1 tablet by mouth daily.   atenolol (TENORMIN) 25 MG tablet TAKE 1 TABLET BY MOUTH IN THE MORNING AND 1/2 TABLET AT BEDTIME   B-D UF III MINI PEN NEEDLES 31G X 5 MM MISC USE 4 TIMES PER DAY   Blood Glucose Monitoring Suppl (ONE TOUCH ULTRA 2) w/Device KIT Use to test blood sugar daily Dx code E11.65   cephALEXin (KEFLEX) 500 MG capsule cephalexin 500 mg capsule  TAKE 1 CAPSULE BY MOUTH THREE TIMES A DAY   cholecalciferol (VITAMIN D) 1000 UNITS tablet Take 1,000 Units by mouth daily.   docusate sodium (COLACE) 100 MG capsule Take 100 mg by mouth 2 (two) times daily.   donepezil (ARICEPT) 10 MG tablet TAKE 1 TABLET BY MOUTH EVERYDAY AT BEDTIME   empagliflozin (JARDIANCE) 25 MG TABS tablet Take 1 tablet (25 mg total) by mouth daily with breakfast.   ferrous gluconate (FERGON) 325 MG tablet Take 325 mg by mouth daily with breakfast.   fluticasone (FLONASE) 50 MCG/ACT nasal spray PLACE 2 SPRAYS INTO BOTH NOSTRILS DAILY.   Lancet Devices (ONE TOUCH DELICA LANCING DEV) MISC Use to check blood sugar three times daily.   memantine (NAMENDA) 5 MG tablet TAKE 1  TABLET BY MOUTH TWICE A DAY   metFORMIN (GLUCOPHAGE) 1000 MG tablet TAKE 1 TABLET IN THE MORNING & 1/2 TABLET AT DINNERTIME   mometasone (ELOCON) 0.1 % ointment Apply topically daily.   omeprazole (PRILOSEC) 20 MG capsule Take 20 mg by mouth daily.   OneTouch Delica Lancets 56E MISC 1 each by Does not apply route in the morning, at noon, and at bedtime. Use to check blood sugar three times daily. DX:E11.65   ONETOUCH VERIO test strip USE AS INSTRUCTED TO CHECK BLOOD SUGAR THREE TIMES A DAY. DX CODE E11.65   repaglinide (PRANDIN) 1 MG tablet Take 1 tablet (1 mg total) by mouth 3 (three) times daily before meals.   simvastatin (ZOCOR) 40 MG tablet TAKE 1 TABLET BY MOUTH EVERYDAY AT BEDTIME   TRESIBA FLEXTOUCH 100 UNIT/ML FlexTouch Pen INJECT 20 UNITS TOTAL INTO THE SKIN DAILY.   Facility-Administered Encounter Medications as of 04/11/2021  Medication   cyanocobalamin ((VITAMIN B-12)) injection 1,000 mcg   GEN CALL: Third unsuccessful telephone outreach was attempted today. The patient was referred to the pharmacist for assistance with care management and care coordination.   Care Gaps:Patient is due for a eye exam and AWV.  Star Rating Drugs: Simvastatin 40 mg 03/27/21 30 DS, Jardiance 25 mg 09/11/20 30 DS, Metformin 1000 mg 08/17/20 90 DS.   Follow-Up:Pharmacist Review  Charlann Lange, [place] Pharmacist Assistant [phone]

## 2021-04-16 ENCOUNTER — Other Ambulatory Visit: Payer: Self-pay

## 2021-04-16 ENCOUNTER — Ambulatory Visit (INDEPENDENT_AMBULATORY_CARE_PROVIDER_SITE_OTHER): Payer: Medicare Other | Admitting: Podiatry

## 2021-04-16 DIAGNOSIS — Z20822 Contact with and (suspected) exposure to covid-19: Secondary | ICD-10-CM | POA: Diagnosis not present

## 2021-04-16 DIAGNOSIS — M79674 Pain in right toe(s): Secondary | ICD-10-CM

## 2021-04-16 DIAGNOSIS — M79675 Pain in left toe(s): Secondary | ICD-10-CM | POA: Diagnosis not present

## 2021-04-16 DIAGNOSIS — B351 Tinea unguium: Secondary | ICD-10-CM

## 2021-04-16 DIAGNOSIS — E119 Type 2 diabetes mellitus without complications: Secondary | ICD-10-CM

## 2021-04-16 NOTE — Progress Notes (Signed)
  Subjective:  Patient ID: [name], male    DOB: [birth year],  MRN: [id number]  Chief Complaint  Patient presents with   Nail Problem    4 month follow up  for Diabetic Foot Care, nail trim   87 y.o. male presents with the above complaint. History confirmed with patient.  Objective:  Physical Exam: warm, good capillary refill, nail exam onychomycosis of the toenails and pain to palpation, no trophic changes or ulcerative lesions. DP pulses palpable, PT pulses palpable and protective sensation intact Left Foot: HPK noted at distal and plantar hallux stump without warmth, erythema, signs of acute infection noted. No open ulcer upon debridement. 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  Assessment:   1. Pain due to onychomycosis of toenails of both feet   2. Diabetes mellitus without complication ([place])    Plan:  Patient was evaluated and treated and all questions answered.  Onychomycosis, DM -Nails palliatively debrided secondary to pain  Procedure: Nail Debridement Type of Debridement: manual, sharp debridement. Instrumentation: Nail nipper, rotary burr. Number of Nails: 8    Return in about 4 months (around 08/16/2021).

## 2021-04-19 DIAGNOSIS — E7800 Pure hypercholesterolemia, unspecified: Secondary | ICD-10-CM | POA: Diagnosis not present

## 2021-04-19 DIAGNOSIS — E118 Type 2 diabetes mellitus with unspecified complications: Secondary | ICD-10-CM | POA: Diagnosis not present

## 2021-04-19 DIAGNOSIS — I10 Essential (primary) hypertension: Secondary | ICD-10-CM | POA: Diagnosis not present

## 2021-04-21 ENCOUNTER — Other Ambulatory Visit: Payer: Self-pay | Admitting: Family Medicine

## 2021-04-21 DIAGNOSIS — E118 Type 2 diabetes mellitus with unspecified complications: Secondary | ICD-10-CM | POA: Diagnosis not present

## 2021-04-21 DIAGNOSIS — I10 Essential (primary) hypertension: Secondary | ICD-10-CM | POA: Diagnosis not present

## 2021-04-21 DIAGNOSIS — E7800 Pure hypercholesterolemia, unspecified: Secondary | ICD-10-CM | POA: Diagnosis not present

## 2021-04-24 DIAGNOSIS — E7800 Pure hypercholesterolemia, unspecified: Secondary | ICD-10-CM | POA: Diagnosis not present

## 2021-04-24 DIAGNOSIS — E118 Type 2 diabetes mellitus with unspecified complications: Secondary | ICD-10-CM | POA: Diagnosis not present

## 2021-04-24 DIAGNOSIS — I10 Essential (primary) hypertension: Secondary | ICD-10-CM | POA: Diagnosis not present

## 2021-04-30 ENCOUNTER — Other Ambulatory Visit: Payer: Self-pay | Admitting: Endocrinology

## 2021-05-07 ENCOUNTER — Ambulatory Visit (INDEPENDENT_AMBULATORY_CARE_PROVIDER_SITE_OTHER): Payer: Medicare Other | Admitting: Endocrinology

## 2021-05-07 ENCOUNTER — Other Ambulatory Visit: Payer: Self-pay

## 2021-05-07 VITALS — BP 124/74 | HR 64 | Ht 69.0 in | Wt 195.2 lb

## 2021-05-07 DIAGNOSIS — E1165 Type 2 diabetes mellitus with hyperglycemia: Secondary | ICD-10-CM | POA: Diagnosis not present

## 2021-05-07 DIAGNOSIS — E7800 Pure hypercholesterolemia, unspecified: Secondary | ICD-10-CM | POA: Diagnosis not present

## 2021-05-07 DIAGNOSIS — R413 Other amnesia: Secondary | ICD-10-CM

## 2021-05-07 DIAGNOSIS — Z794 Long term (current) use of insulin: Secondary | ICD-10-CM

## 2021-05-07 DIAGNOSIS — Z23 Encounter for immunization: Secondary | ICD-10-CM

## 2021-05-07 LAB — COMPREHENSIVE METABOLIC PANEL
ALT: 23 U/L (ref 0–53)
AST: 21 U/L (ref 0–37)
Albumin: 4.2 g/dL (ref 3.5–5.2)
Alkaline Phosphatase: 91 U/L (ref 39–117)
BUN: 28 mg/dL — ABNORMAL HIGH (ref 6–23)
CO2: 27 mEq/L (ref 19–32)
Calcium: 9.4 mg/dL (ref 8.4–10.5)
Chloride: 104 mEq/L (ref 96–112)
Creatinine, Ser: 1.38 mg/dL (ref 0.40–1.50)
GFR: 45.17 mL/min — ABNORMAL LOW (ref >60.00)
Glucose, Bld: 239 mg/dL — ABNORMAL HIGH (ref 70–99)
Potassium: 4.3 mEq/L (ref 3.5–5.1)
Sodium: 139 mEq/L (ref 135–145)
Total Bilirubin: 0.7 mg/dL (ref 0.2–1.2)
Total Protein: 7 g/dL (ref 6.0–8.3)

## 2021-05-07 LAB — POCT GLYCOSYLATED HEMOGLOBIN (HGB A1C): Hemoglobin A1C: 8.9 % — AB (ref 4.0–5.6)

## 2021-05-07 LAB — POCT GLUCOSE (DEVICE FOR HOME USE): POC Glucose: 297 mg/dl — AB (ref 70–99)

## 2021-05-07 LAB — LIPID PANEL
Cholesterol: 129 mg/dL (ref 0–200)
HDL: 48.4 mg/dL (ref >39.00)
LDL Cholesterol: 65 mg/dL (ref 0–99)
NonHDL: 80.99
Total CHOL/HDL Ratio: 3
Triglycerides: 78 mg/dL (ref 0.0–149.0)
VLDL: 15.6 mg/dL (ref 0.0–40.0)

## 2021-05-07 LAB — TSH: TSH: 2 u[IU]/mL (ref 0.35–5.50)

## 2021-05-07 NOTE — Patient Instructions (Addendum)
Start OZEMPIC injections by dialing 0.25 mg on the pen as shown once weekly on the same day of the week.   You may inject in the sides of the stomach, outer thigh or arm as indicated in the brochure given. If you have any difficulties using the pen see the video at [email or web address]  You will feel fullness of the stomach with starting the medication and should try to keep the portions at meals small.  You may experience nausea in the first few days which usually gets better over time    After 4 weeks increase the dose to 0.5 mg weekly  If you have any questions or persistent side effects please call the office   You may also talk to a nurse educator with [employer] at [phone] Useful website: [email or web address]   Call if am sugar <70  Restart B12

## 2021-05-07 NOTE — Progress Notes (Signed)
Patient ID: [name], male   DOB: 1929-04-20, 87 y.o.   MRN: [id number]   Reason for Appointment: Diabetes follow-up   History of Present Illness   Diagnosis: Type 2 DIABETES MELITUS, diagnoses 1972   He has had long-standing diabetes and has been on basal bolus insulin regimen after failure of oral hypoglycemic drugs over the last several years His blood sugars have been generally fairly well controlled but has a tendency to high postprandial readings Previously required only a small dose of 5 units of Levemir insulin  Taking Amaryl in addition to his insulin regimen  his blood sugars had improved his blood sugars during the daytime  He had taken Toujeo since 9/16 instead of twice a day Levemir  RECENT history:   Insulin regimen:  Tresiba 16 units daily Oral hypoglycemic drugs: Metformin 1500 mg, Jardiance 25 mg daily, Prandin 1 mg before each meal  A1c is higher at 8.9, has been as low as 7.5   Current management, blood sugar patterns and problems identified His blood sugars are appearing consistently high postprandially  Blood sugar data from freestyle libre discussed below, this is also reading falsely low about 20%  Blood sugars are only occasionally monitored on the weekend and in the afternoon  Also today after breakfast his glucose is relatively higher about 90 mg  Although he has not gained weight he is eating well with a good appetite and eating 3 meals a day  No hypoglycemia although a couple of times blood sugars have been registering lower than 70 early morning, likely falsely low He is doing a little walking outside but not any formal exercise Weight is unchanged Compliance with his medications especially Prandin is uncertain as he is not being supervised in the evening at least    Side effects from medications: None          Proper timing of insulin in relation to meals: Yes.                    Download of freestyle libre sensor shows the  following patterns  Blood sugars are being monitored primarily in the morning and at only available for overnight blood sugars and rarely for the afternoon blood sugars Glucose very consistently high around midnight averaging about 240 at that time Overnight blood sugars subsequently did send to a variable extent, in the last week they are mostly still increased at breakfast time: The previous week fasting readings were near normal or slightly longer around 6-7 AM FASTING average 129 between 6-8 AM HIGHEST average 326 between 8-10 PM with limited data postprandial readings after lunch were rising significantly to around 300 or more  Fingerstick glucose today 297 vs 250 on the sensor at the same time PREVIOUS lab glucose was 207 compared to the office reading of 175 on the freestyle libre  CGM use % of time 35  2-week average/GV 179/35  Time in range   51     %  % Time Above 180 33  % Time above 250 17  % Time Below 70 2     PREVIOUSLY: AVERAGE 167 overall with mostly overnight blood sugars assessed which are highly variable   Meals: 3 meals per day. Breakfast at 8 am Lunch 12 noon Supper at 5-6 pm; breakfast is usually oatmeal with eggs.  Drinks water mostly and no sweet drinks      Wt Readings from Last 3  Encounters:  05/07/21 195 lb 3.2 oz (88.5 kg)  01/01/21 196 lb 4 oz (89 kg)  09/24/20 193 lb (87.5 kg)   Lab Results  Component Value Date   HGBA1C 8.9 (A) 05/07/2021   HGBA1C 8.1 (A) 01/01/2021   HGBA1C 7.5 (A) 09/24/2020   Lab Results  Component Value Date   MICROALBUR 7.9 (H) 05/30/2020   LDLCALC 59 09/24/2020   CREATININE 1.25 01/01/2021     Allergies as of 05/07/2021       Reactions   Avodart [dutasteride] Swelling   Ibuprofen Nausea And Vomiting   Morphine Nausea And Vomiting        Medication List        Accurate as of May 07, 2021 10:07 AM. If you have any questions, ask your nurse or doctor.          amoxicillin-clavulanate 875-125 MG  tablet Commonly known as: AUGMENTIN Take 1 tablet by mouth 2 (two) times daily.   aspirin 81 MG EC tablet Take 1 tablet by mouth daily.   atenolol 25 MG tablet Commonly known as: TENORMIN TAKE 1 TABLET BY MOUTH IN THE MORNING AND 1/2 TABLET AT BEDTIME   B-D UF III MINI PEN NEEDLES 31G X 5 MM Misc Generic drug: Insulin Pen Needle USE 4 TIMES PER DAY   cephALEXin 500 MG capsule Commonly known as: KEFLEX cephalexin 500 mg capsule  TAKE 1 CAPSULE BY MOUTH THREE TIMES A DAY   cholecalciferol 1000 units tablet Commonly known as: VITAMIN D Take 1,000 Units by mouth daily.   docusate sodium 100 MG capsule Commonly known as: COLACE Take 100 mg by mouth 2 (two) times daily.   donepezil 10 MG tablet Commonly known as: ARICEPT TAKE 1 TABLET BY MOUTH EVERYDAY AT BEDTIME   empagliflozin 25 MG Tabs tablet Commonly known as: Jardiance Take 1 tablet (25 mg total) by mouth daily with breakfast.   ferrous gluconate 325 MG tablet Commonly known as: FERGON Take 325 mg by mouth daily with breakfast.   fluticasone 50 MCG/ACT nasal spray Commonly known as: FLONASE PLACE 2 SPRAYS INTO BOTH NOSTRILS DAILY.   memantine 5 MG tablet Commonly known as: NAMENDA TAKE 1 TABLET BY MOUTH TWICE A DAY   metFORMIN 1000 MG tablet Commonly known as: GLUCOPHAGE TAKE 1 TABLET IN THE MORNING & 1/2 TABLET AT DINNERTIME   mometasone 0.1 % ointment Commonly known as: Elocon Apply topically daily.   omeprazole 20 MG capsule Commonly known as: PRILOSEC Take 20 mg by mouth daily.   ONE TOUCH DELICA LANCING DEV Misc Use to check blood sugar three times daily.   ONE TOUCH ULTRA 2 w/Device Kit Use to test blood sugar daily Dx code A35.57   OneTouch Delica Lancets 32K Misc 1 each by Does not apply route in the morning, at noon, and at bedtime. Use to check blood sugar three times daily. DX:E11.65   OneTouch Verio test strip Generic drug: glucose blood USE AS INSTRUCTED TO CHECK BLOOD SUGAR THREE  TIMES A DAY. DX CODE E11.65   repaglinide 1 MG tablet Commonly known as: PRANDIN Take 1 tablet (1 mg total) by mouth 3 (three) times daily before meals.   simvastatin 40 MG tablet Commonly known as: ZOCOR TAKE 1 TABLET BY MOUTH EVERYDAY AT BEDTIME   Tresiba FlexTouch 100 UNIT/ML FlexTouch Pen Generic drug: insulin degludec INJECT 20 UNITS TOTAL INTO THE SKIN DAILY.        Allergies:  Allergies  Allergen Reactions   Avodart [Dutasteride] Swelling  Ibuprofen Nausea And Vomiting   Morphine Nausea And Vomiting    Past Medical History:  Diagnosis Date   B12 deficiency    BPH (benign prostatic hypertrophy)    Colon polyps    Diabetes mellitus    GERD (gastroesophageal reflux disease)    hiatal hernia   History of kidney stones    Hyperlipidemia    Hypertension    Osteoarthritis     Past Surgical History:  Procedure Laterality Date   APPENDECTOMY     CARDIOVASCULAR STRESS TEST  09/02/2011   Post stress myocardial perfusion images show normal pattern of perfusion in all areas. No significant wall abnormalites noted.   CAROTID DOPPLER  01/27/2011   Right & Left ICAs 0-49% diameter reduction, right & left subclavian arteries demonstrated less than 50% diameter reduction.   CYSTOSCOPY WITH BIOPSY  06/17/2012   Procedure: CYSTOSCOPY WITH BIOPSY;  Surgeon: Molli Hazard, MD;  Location: [facility];  Service: Urology;  Laterality: N/A;   KNEE CARTILAGE SURGERY     Arthroscope   Partial amputaion left foot     TRANSTHORACIC ECHOCARDIOGRAM  09/02/2011   EF >55%, normal LV systolic function, mild diastolic dysfunction   TRANSURETHRAL RESECTION OF PROSTATE  06/17/2012   Procedure: TRANSURETHRAL RESECTION OF THE PROSTATE WITH GYRUS INSTRUMENTS;  Surgeon: Molli Hazard, MD;  Location: [facility];  Service: Urology;  Laterality: N/A;    Family History  Problem Relation Age of Onset   Heart attack Mother    Hypertension Mother     Social History:  reports that he has quit  smoking. He has never used smokeless tobacco. He reports that he does not drink alcohol and does not use drugs.  Review of Systems:  Blood pressure management: Taking atenolol 25 mg only and not clear if he has true hypertension Also is on Jardiance   BP Readings from Last 3 Encounters:  05/07/21 124/74  01/01/21 136/70  09/24/20 138/80   Renal function has been stable on Jardiance:  Lab Results  Component Value Date   CREATININE 1.25 01/01/2021   CREATININE 1.25 09/24/2020   CREATININE 1.27 05/30/2020     HYPERLIPIDEMIA:   Treated with 40 mg simvastatin Excellent control on the last lab  Lab Results  Component Value Date   CHOL 122 09/24/2020   CHOL 177 05/30/2020   CHOL 175 09/14/2019   Lab Results  Component Value Date   HDL 45.10 09/24/2020   HDL 49.90 05/30/2020   HDL 55.00 09/14/2019   Lab Results  Component Value Date   LDLCALC 59 09/24/2020   LDLCALC 113 (H) 05/30/2020   LDLCALC 109 (H) 09/14/2019   Lab Results  Component Value Date   TRIG 90.0 09/24/2020   TRIG 72.0 05/30/2020   TRIG 57.0 09/14/2019   Lab Results  Component Value Date   CHOLHDL 3 09/24/2020   CHOLHDL 4 05/30/2020   CHOLHDL 3 09/14/2019   Lab Results  Component Value Date   LDLDIRECT 89.0 06/20/2019    Foot care:  foot exam in 9/22 with podiatrist  Previous findings on exam:  Vascular: dorsalis pedis and posterior tibial pulses are palpable bilateral. Capillary return is immediate. Temperature gradient is WNL. Skin turgor WNL  Sensorium: Normal Semmes Weinstein monofilament test. Normal tactile sensation bilaterally.  Vitamin B12 deficiency: His last level was back to normal with taking 1000 mcg of B12 but he has run out of this  His son says that he feels sleepy all the time, he is taking  Aricept   Examination:   BP 124/74   Pulse 64   Ht _0  (1.753 m)   Wt 195 lb 3.2 oz (88.5 kg)   SpO2 96%   BMI 28.83 kg/m   Body mass index is 28.83 kg/m.    ASSESSMENT/  PLAN:    Diabetes type 2 insulin requiring  See history of present illness for detailed discussion of his current management, blood sugar patterns and problems identified  His A1c is higher at 8.9  Although his blood sugars may be reasonably okay for his age but appears to have significant postprandial hyperglycemia and may be having functional impairment from marked fluctuation in his blood sugars between near normal and therefore 100 after meals Does not have insulin deficiency and this was discussed with his son, likely not benefiting from [place]  Blood sugars after meals may improve with a GLP-1 drug and will give him a trial of Ozempic    Recommendations today:  Explained nature of GLP-1 drugs , the sites of actions a regarding diabetes physiology, reduction of hunger sensation and improved insulin secretion.  Discussed the benefit of weight loss with these medications. Explained possible side effects of OZEMPIC, most commonly nausea that usually improves over time.  Also explained safety information associated with the medication Demonstrated the medication injection device and injection technique to the patient.  To start the injections with the 0.25 mg dosage weekly for the first 4 injections and then go up to 0.5 mg weekly if no continued nausea Sample given  Lipids: Needs follow-up today  B12 deficiency possibly from metformin: Need to resume 1000 mcg of vitamin B12  Follow-up in December   Influenza vaccine given   There are no Patient Instructions on file for this visit.    Elayne Snare 05/07/2021, 10:07 AM

## 2021-05-07 NOTE — Addendum Note (Signed)
Addended by: Cinda Quest on: 05/07/2021 01:09 PM   Modules accepted: Orders

## 2021-05-20 DIAGNOSIS — Z20822 Contact with and (suspected) exposure to covid-19: Secondary | ICD-10-CM | POA: Diagnosis not present

## 2021-05-20 DIAGNOSIS — E118 Type 2 diabetes mellitus with unspecified complications: Secondary | ICD-10-CM | POA: Diagnosis not present

## 2021-05-20 DIAGNOSIS — I10 Essential (primary) hypertension: Secondary | ICD-10-CM | POA: Diagnosis not present

## 2021-05-20 DIAGNOSIS — E7800 Pure hypercholesterolemia, unspecified: Secondary | ICD-10-CM | POA: Diagnosis not present

## 2021-05-21 DIAGNOSIS — I10 Essential (primary) hypertension: Secondary | ICD-10-CM | POA: Diagnosis not present

## 2021-05-21 DIAGNOSIS — E118 Type 2 diabetes mellitus with unspecified complications: Secondary | ICD-10-CM | POA: Diagnosis not present

## 2021-05-21 DIAGNOSIS — E7800 Pure hypercholesterolemia, unspecified: Secondary | ICD-10-CM | POA: Diagnosis not present

## 2021-05-23 ENCOUNTER — Telehealth: Payer: Self-pay | Admitting: Endocrinology

## 2021-05-23 NOTE — Telephone Encounter (Signed)
[employer] faxed over request of Boot ankle braces. Please faxed [phone].

## 2021-05-24 ENCOUNTER — Telehealth: Payer: Self-pay | Admitting: Pharmacy Technician

## 2021-05-24 DIAGNOSIS — I10 Essential (primary) hypertension: Secondary | ICD-10-CM | POA: Diagnosis not present

## 2021-05-24 DIAGNOSIS — E118 Type 2 diabetes mellitus with unspecified complications: Secondary | ICD-10-CM | POA: Diagnosis not present

## 2021-05-24 DIAGNOSIS — E7800 Pure hypercholesterolemia, unspecified: Secondary | ICD-10-CM | POA: Diagnosis not present

## 2021-05-24 NOTE — Telephone Encounter (Signed)
Patient Advocate Encounter   We received a Medical PA form. Emailed it back to you guys.

## 2021-05-27 NOTE — Telephone Encounter (Signed)
Fowarded it to the pcp office.

## 2021-05-28 NOTE — Telephone Encounter (Signed)
Paperwork has been given to provider to be signed and completed. Will fax once completed.

## 2021-05-28 NOTE — Telephone Encounter (Signed)
noted 

## 2021-05-30 ENCOUNTER — Ambulatory Visit: Payer: Medicare Other

## 2021-05-30 DIAGNOSIS — Z5181 Encounter for therapeutic drug level monitoring: Secondary | ICD-10-CM | POA: Insufficient documentation

## 2021-05-30 DIAGNOSIS — M1610 Unilateral primary osteoarthritis, unspecified hip: Secondary | ICD-10-CM | POA: Insufficient documentation

## 2021-05-30 DIAGNOSIS — I10 Essential (primary) hypertension: Secondary | ICD-10-CM | POA: Insufficient documentation

## 2021-05-30 DIAGNOSIS — K219 Gastro-esophageal reflux disease without esophagitis: Secondary | ICD-10-CM | POA: Insufficient documentation

## 2021-06-03 DIAGNOSIS — Z20822 Contact with and (suspected) exposure to covid-19: Secondary | ICD-10-CM | POA: Diagnosis not present

## 2021-06-07 DIAGNOSIS — Z20822 Contact with and (suspected) exposure to covid-19: Secondary | ICD-10-CM | POA: Diagnosis not present

## 2021-06-11 ENCOUNTER — Other Ambulatory Visit: Payer: Self-pay | Admitting: Family Medicine

## 2021-06-13 ENCOUNTER — Other Ambulatory Visit: Payer: Self-pay

## 2021-06-13 ENCOUNTER — Ambulatory Visit (INDEPENDENT_AMBULATORY_CARE_PROVIDER_SITE_OTHER): Payer: Medicare Other

## 2021-06-13 ENCOUNTER — Telehealth: Payer: Self-pay

## 2021-06-13 VITALS — Ht 68.0 in | Wt 195.0 lb

## 2021-06-13 DIAGNOSIS — Z0000 Encounter for general adult medical examination without abnormal findings: Secondary | ICD-10-CM | POA: Diagnosis not present

## 2021-06-13 NOTE — Telephone Encounter (Signed)
Spoke with pt's son, [name] for pt's AWV. [name] asks if he could have all of his Dad's care with you. He wants to transfer his diabetic care from Dr. Dwyane Dee to you. Son is not satisfied with Dr. Ronnie Derby care of his Dad. I told him that I would double check with you to make sure you are okay with that. Thank you.

## 2021-06-13 NOTE — Patient Instructions (Signed)
[name] , Thank you for taking time to come for your Medicare Wellness Visit. I appreciate your ongoing commitment to your health goals. Please review the following plan we discussed and let me know if I can assist you in the future.   Screening recommendations/referrals: Colonoscopy: No longer required due to age.  Recommended yearly ophthalmology/optometry visit for glaucoma screening and checkup Recommended yearly dental visit for hygiene and checkup  Vaccinations: Influenza vaccine: Done 05/07/2021 Pneumococcal vaccine: Done 01/31/2015 and 12/23/2016 Tdap vaccine: Done 11/02/2012 Repeat in 10 years  Shingles vaccine: Shingrix discussed. Please contact your pharmacy for coverage information.     Covid-19: Done 09/21/19, 10/19/19, 12/27/19 and 02/01/20  Advanced directives: Copy in chart.  Conditions/risks identified: Drink 6-8 glasses of water and eat lots of fruits and vegetables.  Next appointment: Follow up in one year for your annual wellness visit. 2023.  Preventive Care 35 Years and Older, Male  Preventive care refers to lifestyle choices and visits with your health care provider that can promote health and wellness. What does preventive care include? A yearly physical exam. This is also called an annual well check. Dental exams once or twice a year. Routine eye exams. Ask your health care provider how often you should have your eyes checked. Personal lifestyle choices, including: Daily care of your teeth and gums. Regular physical activity. Eating a healthy diet. Avoiding tobacco and drug use. Limiting alcohol use. Practicing safe sex. Taking low doses of aspirin every day. Taking vitamin and mineral supplements as recommended by your health care provider. What happens during an annual well check? The services and screenings done by your health care provider during your annual well check will depend on your age, overall health, lifestyle risk factors, and family history  of disease. Counseling  Your health care provider may ask you questions about your: Alcohol use. Tobacco use. Drug use. Emotional well-being. Home and relationship well-being. Sexual activity. Eating habits. History of falls. Memory and ability to understand (cognition). Work and work Statistician. Screening  You may have the following tests or measurements: Height, weight, and BMI. Blood pressure. Lipid and cholesterol levels. These may be checked every 5 years, or more frequently if you are over 64 years old. Skin check. Lung cancer screening. You may have this screening every year starting at age 50 if you have a 30-pack-year history of smoking and currently smoke or have quit within the past 15 years. Fecal occult blood test (FOBT) of the stool. You may have this test every year starting at age 36. Flexible sigmoidoscopy or colonoscopy. You may have a sigmoidoscopy every 5 years or a colonoscopy every 10 years starting at age 12. Prostate cancer screening. Recommendations will vary depending on your family history and other risks. Hepatitis C blood test. Hepatitis B blood test. Sexually transmitted disease (STD) testing. Diabetes screening. This is done by checking your blood sugar (glucose) after you have not eaten for a while (fasting). You may have this done every 1-3 years. Abdominal aortic aneurysm (AAA) screening. You may need this if you are a current or former smoker. Osteoporosis. You may be screened starting at age 20 if you are at high risk. Talk with your health care provider about your test results, treatment options, and if necessary, the need for more tests. Vaccines  Your health care provider may recommend certain vaccines, such as: Influenza vaccine. This is recommended every year. Tetanus, diphtheria, and acellular pertussis (Tdap, Td) vaccine. You may need a Td booster every 10  years. Zoster vaccine. You may need this after age 4. Pneumococcal 13-valent  conjugate (PCV13) vaccine. One dose is recommended after age 64. Pneumococcal polysaccharide (PPSV23) vaccine. One dose is recommended after age 80. Talk to your health care provider about which screenings and vaccines you need and how often you need them. This information is not intended to replace advice given to you by your health care provider. Make sure you discuss any questions you have with your health care provider. Document Released: 08/10/2015 Document Revised: 04/02/2016 Document Reviewed: 05/15/2015 Elsevier Interactive Patient Education  2017 [place] Prevention in the Home Falls can cause injuries. They can happen to people of all ages. There are many things you can do to make your home safe and to help prevent falls. What can I do on the outside of my home? Regularly fix the edges of walkways and driveways and fix any cracks. Remove anything that might make you trip as you walk through a door, such as a raised step or threshold. Trim any bushes or trees on the path to your home. Use bright outdoor lighting. Clear any walking paths of anything that might make someone trip, such as rocks or tools. Regularly check to see if handrails are loose or broken. Make sure that both sides of any steps have handrails. Any raised decks and porches should have guardrails on the edges. Have any leaves, snow, or ice cleared regularly. Use sand or salt on walking paths during winter. Clean up any spills in your garage right away. This includes oil or grease spills. What can I do in the bathroom? Use night lights. Install grab bars by the toilet and in the tub and shower. Do not use towel bars as grab bars. Use non-skid mats or decals in the tub or shower. If you need to sit down in the shower, use a plastic, non-slip stool. Keep the floor dry. Clean up any water that spills on the floor as soon as it happens. Remove soap buildup in the tub or shower regularly. Attach bath mats  securely with double-sided non-slip rug tape. Do not have throw rugs and other things on the floor that can make you trip. What can I do in the bedroom? Use night lights. Make sure that you have a light by your bed that is easy to reach. Do not use any sheets or blankets that are too big for your bed. They should not hang down onto the floor. Have a firm chair that has side arms. You can use this for support while you get dressed. Do not have throw rugs and other things on the floor that can make you trip. What can I do in the kitchen? Clean up any spills right away. Avoid walking on wet floors. Keep items that you use a lot in easy-to-reach places. If you need to reach something above you, use a strong step stool that has a grab bar. Keep electrical cords out of the way. Do not use floor polish or wax that makes floors slippery. If you must use wax, use non-skid floor wax. Do not have throw rugs and other things on the floor that can make you trip. What can I do with my stairs? Do not leave any items on the stairs. Make sure that there are handrails on both sides of the stairs and use them. Fix handrails that are broken or loose. Make sure that handrails are as long as the stairways. Check any carpeting to make sure  that it is firmly attached to the stairs. Fix any carpet that is loose or worn. Avoid having throw rugs at the top or bottom of the stairs. If you do have throw rugs, attach them to the floor with carpet tape. Make sure that you have a light switch at the top of the stairs and the bottom of the stairs. If you do not have them, ask someone to add them for you. What else can I do to help prevent falls? Wear shoes that: Do not have high heels. Have rubber bottoms. Are comfortable and fit you well. Are closed at the toe. Do not wear sandals. If you use a stepladder: Make sure that it is fully opened. Do not climb a closed stepladder. Make sure that both sides of the stepladder  are locked into place. Ask someone to hold it for you, if possible. Clearly mark and make sure that you can see: Any grab bars or handrails. First and last steps. Where the edge of each step is. Use tools that help you move around (mobility aids) if they are needed. These include: Canes. Walkers. Scooters. Crutches. Turn on the lights when you go into a dark area. Replace any light bulbs as soon as they burn out. Set up your furniture so you have a clear path. Avoid moving your furniture around. If any of your floors are uneven, fix them. If there are any pets around you, be aware of where they are. Review your medicines with your doctor. Some medicines can make you feel dizzy. This can increase your chance of falling. Ask your doctor what other things that you can do to help prevent falls. This information is not intended to replace advice given to you by your health care provider. Make sure you discuss any questions you have with your health care provider. Document Released: 05/10/2009 Document Revised: 12/20/2015 Document Reviewed: 08/18/2014 Elsevier Interactive Patient Education  2017 [employer].

## 2021-06-13 NOTE — Progress Notes (Signed)
Subjective:   [name] is a 87 y.o. male who presents for an Initial Medicare Annual Wellness Visit. Virtual Visit via Telephone Note  I connected with  [name] on 06/13/21 at  2:45 PM EST by telephone and verified that I am speaking with the correct person using two identifiers.  Location: Patient: Home Provider: [facility] Persons participating in the virtual visit: patient/Nurse Health Advisor   I discussed the limitations, risks, security and privacy concerns of performing an evaluation and management service by telephone and the availability of in person appointments. The patient expressed understanding and agreed to proceed.  Interactive audio and video telecommunications were attempted between this nurse and patient, however failed, due to patient having technical difficulties OR patient did not have access to video capability.  We continued and completed visit with audio only.  Some vital signs may be absent or patient reported.   [name] Driver, LPN  Review of Systems     Cardiac Risk Factors include: diabetes mellitus;advanced age (>18mn, >>58women);dyslipidemia;hypertension;male gender;sedentary lifestyle;Other (see comment), Risk factor comments: Dementia     Objective:    Today's Vitals   06/13/21 1501  Weight: 195 lb (88.5 kg)  Height: 5' 8" (1.727 m)   Body mass index is 29.65 kg/m.  Advanced Directives 06/13/2021 06/17/2012 06/10/2012  Does Patient Have a Medical Advance Directive? Yes Patient has advance directive, copy not in chart Patient has advance directive, copy not in chart;Patient has advance directive, copy in chart  Type of Advance Directive HM[place]Living will Living will -  Copy of [place]? Yes - validated most recent copy scanned in chart (See row information) Copy requested from family -    Current Medications (verified) Outpatient Encounter Medications as of 06/13/2021   Medication Sig   aspirin 81 MG EC tablet Take 1 tablet by mouth daily.   atenolol (TENORMIN) 25 MG tablet TAKE 1 TABLET BY MOUTH IN THE MORNING AND 1/2 TABLET AT BEDTIME   B-D UF III MINI PEN NEEDLES 31G X 5 MM MISC USE 4 TIMES PER DAY   Blood Glucose Monitoring Suppl (ONE TOUCH ULTRA 2) w/Device KIT Use to test blood sugar daily Dx code E11.65   cholecalciferol (VITAMIN D) 1000 UNITS tablet Take 1,000 Units by mouth daily.   docusate sodium (COLACE) 100 MG capsule Take 100 mg by mouth 2 (two) times daily.   donepezil (ARICEPT) 10 MG tablet TAKE 1 TABLET BY MOUTH EVERYDAY AT BEDTIME   empagliflozin (JARDIANCE) 25 MG TABS tablet Take 1 tablet (25 mg total) by mouth daily with breakfast.   ferrous gluconate (FERGON) 325 MG tablet Take 325 mg by mouth daily with breakfast.   Lancet Devices (ONE TOUCH DELICA LANCING DEV) MISC Use to check blood sugar three times daily.   memantine (NAMENDA) 10 MG tablet Take 0.5 tablets by mouth 2 (two) times daily.   metFORMIN (GLUCOPHAGE) 500 MG tablet TAKE TWO TABLETS BY MOUTH EVERY MORNING AND TAKE ONE TABLET EVERY EVENING FOR 90 DAYS   mometasone (ELOCON) 0.1 % ointment Apply topically daily.   omeprazole (PRILOSEC) 20 MG capsule Take 20 mg by mouth daily.   OneTouch Delica Lancets 334HMISC 1 each by Does not apply route in the morning, at noon, and at bedtime. Use to check blood sugar three times daily. DX:E11.65   ONETOUCH VERIO test strip USE AS INSTRUCTED TO CHECK BLOOD SUGAR THREE TIMES A DAY. DX CODE E11.65  repaglinide (PRANDIN) 1 MG tablet Take 1 tablet (1 mg total) by mouth 3 (three) times daily before meals.   simvastatin (ZOCOR) 40 MG tablet TAKE ONE TABLET BY MOUTH EVERY NIGHT AT BEDTIME FOR HYPERLIPIDEMIA   TRESIBA FLEXTOUCH 100 UNIT/ML FlexTouch Pen INJECT 20 UNITS TOTAL INTO THE SKIN DAILY.   amoxicillin-clavulanate (AUGMENTIN) 875-125 MG tablet Take 1 tablet by mouth 2 (two) times daily. (Patient not taking: Reported on 06/13/2021)    cephALEXin (KEFLEX) 500 MG capsule cephalexin 500 mg capsule  TAKE 1 CAPSULE BY MOUTH THREE TIMES A DAY (Patient not taking: Reported on 06/13/2021)   [DISCONTINUED] metFORMIN (GLUCOPHAGE) 500 MG tablet TAKE TWO TABLETS BY MOUTH EVERY MORNING AND TAKE ONE TABLET EVERY EVENING FOR 90 DAYS   [DISCONTINUED] simvastatin (ZOCOR) 40 MG tablet TAKE 1 TABLET BY MOUTH EVERYDAY AT BEDTIME   Facility-Administered Encounter Medications as of 06/13/2021  Medication   cyanocobalamin ((VITAMIN B-12)) injection 1,000 mcg    Allergies (verified) Avodart [dutasteride], Ibuprofen, and Morphine   History: Past Medical History:  Diagnosis Date   B12 deficiency    BPH (benign prostatic hypertrophy)    Colon polyps    Diabetes mellitus    GERD (gastroesophageal reflux disease)    hiatal hernia   History of kidney stones    Hyperlipidemia    Hypertension    Osteoarthritis    Past Surgical History:  Procedure Laterality Date   APPENDECTOMY     CARDIOVASCULAR STRESS TEST  09/02/2011   Post stress myocardial perfusion images show normal pattern of perfusion in all areas. No significant wall abnormalites noted.   CAROTID DOPPLER  01/27/2011   Right & Left ICAs 0-49% diameter reduction, right & left subclavian arteries demonstrated less than 50% diameter reduction.   CYSTOSCOPY WITH BIOPSY  06/17/2012   Procedure: CYSTOSCOPY WITH BIOPSY;  Surgeon: Molli Hazard, MD;  Location: [facility];  Service: Urology;  Laterality: N/A;   KNEE CARTILAGE SURGERY     Arthroscope   Partial amputaion left foot     TRANSTHORACIC ECHOCARDIOGRAM  09/02/2011   EF >55%, normal LV systolic function, mild diastolic dysfunction   TRANSURETHRAL RESECTION OF PROSTATE  06/17/2012   Procedure: TRANSURETHRAL RESECTION OF THE PROSTATE WITH GYRUS INSTRUMENTS;  Surgeon: Molli Hazard, MD;  Location: [facility];  Service: Urology;  Laterality: N/A;   Family History  Problem Relation Age of Onset   Heart attack Mother     Hypertension Mother    Social History   Socioeconomic History   Marital status: Widowed    Spouse name: Not on file   Number of children: 1   Years of education: Not on file   Highest education level: Not on file  Occupational History   Occupation: Retired  Tobacco Use   Smoking status: Former   Smokeless tobacco: Never  Substance and Sexual Activity   Alcohol use: No    Alcohol/week: 0.0 standard drinks   Drug use: No   Sexual activity: Not Currently    Comment: married to [place], retired.  Other Topics Concern   Not on file  Social History Narrative   Wife Stanton Kidney died in Sep 04, 2017.   Has sitters 5 days per week. Lives next door to son, Jacqulynn Cadet.   Social Determinants of Health   Financial Resource Strain: Low Risk    Difficulty of Paying Living Expenses: Not hard at all  Food Insecurity: No Food Insecurity   Worried About Charity fundraiser in the Last Year: Never true  Ran Out of Food in the Last Year: Never true  Transportation Needs: No Transportation Needs   Lack of Transportation (Medical): No   Lack of Transportation (Non-Medical): No  Physical Activity: Inactive   Days of Exercise per Week: 0 days   Minutes of Exercise per Session: 0 min  Stress: No Stress Concern Present   Feeling of Stress : Not at all  Social Connections: Moderately Isolated   Frequency of Communication with Friends and Family: More than three times a week   Frequency of Social Gatherings with Friends and Family: More than three times a week   Attends Religious Services: 1 to 4 times per year   Active Member of [employer] or Organizations: No   Attends Archivist Meetings: Never   Marital Status: Widowed    Tobacco Counseling Counseling given: Not Answered   Clinical Intake:  Pre-visit preparation completed: Yes  Pain : No/denies pain     BMI - recorded: 29.65 Nutritional Status: BMI 25 -29 Overweight Nutritional Risks: None Diabetes: Yes  How often do you need to have  someone help you when you read instructions, pamphlets, or other written materials from your doctor or pharmacy?: 5 - Always  Diabetic?Nutrition Risk Assessment:  Has the patient had any N/V/D within the last 2 months?  No  Does the patient have any non-healing wounds?  No  Has the patient had any unintentional weight loss or weight gain?  No   Diabetes:  Is the patient diabetic?  Yes  If diabetic, was a CBG obtained today?  No  Phone visit.  Did the patient bring in their glucometer from home?  No  How often do you monitor your CBG's? Daily.   Financial Strains and Diabetes Management:  Are you having any financial strains with the device, your supplies or your medication? No .  Does the patient want to be seen by Chronic Care Management for management of their diabetes?  No  Would the patient like to be referred to a Nutritionist or for Diabetic Management?  No   Diabetic Exams:  Diabetic Eye Exam: Completed 02/24/2018. Overdue for diabetic eye exam. Pt has been advised about the importance in completing this exam.  Diabetic Foot Exam: Completed 05/21/2020. Pt has been advised about the importance in completing this exam.   Interpreter Needed?: No  Information entered by :: MJ , LPN   Activities of Daily Living In your present state of health, do you have any difficulty performing the following activities: 06/13/2021  Hearing? N  Vision? N  Difficulty concentrating or making decisions? Y  Comment Severe Dementia.  Walking or climbing stairs? Y  Dressing or bathing? N  Doing errands, shopping? Y  Preparing Food and eating ? Y  Using the Toilet? N  In the past six months, have you accidently leaked urine? Y  Do you have problems with loss of bowel control? N  Managing your Medications? Y  Managing your Finances? Y  Housekeeping or managing your Housekeeping? Y  Some recent data might be hidden    Patient Care Team: Susy Frizzle, MD as PCP - General (Family  Medicine) Edythe Clarity, [facility] as Pharmacist (Pharmacist) Edythe Clarity, [facility] as Pharmacist (Pharmacist)  Indicate any recent Medical Services you may have received from other than [facility] providers in the past year (date may be approximate).     Assessment:   This is a routine wellness examination for [place].  Hearing/Vision screen Hearing Screening - Comments:: No  hearing issues.  Vision Screening - Comments:: Glasses. Overdue.   Dietary issues and exercise activities discussed: Current Exercise Habits: The patient does not participate in regular exercise at present, Exercise limited by: cardiac condition(s);psychological condition(s)   Goals Addressed             This Visit's Progress    Have 3 meals a day         Depression Screen PHQ 2/9 Scores 06/13/2021 04/09/2017 09/09/2016 09/06/2014  PHQ - 2 Score 0 0 0 0    Fall Risk Fall Risk  06/13/2021 07/31/2020 04/09/2017 09/09/2016 09/06/2014  Falls in the past year? 0 0 No No No  Number falls in past yr: 0 0 - - -  Injury with Fall? 0 0 - No -  Risk for fall due to : Impaired balance/gait;Impaired mobility;Mental status change - - Impaired balance/gait -  Follow up Falls prevention discussed - - - -    FALL RISK PREVENTION PERTAINING TO THE HOME:  Any stairs in or around the home? No  If so, are there any without handrails? No  Home free of loose throw rugs in walkways, pet beds, electrical cords, etc? Yes  Adequate lighting in your home to reduce risk of falls? Yes   ASSISTIVE DEVICES UTILIZED TO PREVENT FALLS:  Life alert? No  Use of a cane, walker or w/c? No  Grab bars in the bathroom? Yes  Shower chair or bench in shower? Yes  Elevated toilet seat or a handicapped toilet? Yes   TIMED UP AND GO:  Was the test performed? No .  PHONE VISIT.  Cognitive Function: Patient has current diagnosis of cognitive impairment. 06/13/2021. Patient is unable to complete screening 6CIT or MMSE.  PT HAS DEMENTIA. VISIT  COMPLETED BY SON.          Immunizations Immunization History  Administered Date(s) Administered   Fluad Quad(high Dose 65+) 03/31/2019, 05/07/2021   Influenza, High Dose Seasonal PF 06/09/2018, 06/05/2020   Influenza,inj,Quad PF,6+ Mos 05/03/2013, 05/25/2014, 04/26/2015, 04/09/2017   Influenza-Unspecified 06/06/1999, 05/19/2000, 05/01/2001, 05/25/2002, 04/28/2003, 05/12/2003, 04/27/2004, 05/21/2004, 04/27/2005, 05/12/2005, 05/15/2006, 05/14/2007, 05/18/2008, 05/03/2009, 04/23/2010, 05/05/2011, 05/20/2011, 05/12/2012, 04/27/2013, 04/04/2014, 04/28/2015, 04/27/2016, 04/30/2018, 05/05/2020   Moderna Sars-Covid-2 Vaccination 09/21/2019, 10/19/2019, 12/27/2019, 02/01/2020   Pneumococcal Conjugate-13 10/31/2013, 01/31/2015   Pneumococcal Polysaccharide-23 04/23/1999, 12/23/2016   Pneumococcal-Unspecified 07/28/2000   Td 03/26/1998, 06/01/2007   Tdap 11/02/2012    TDAP status: Up to date  Flu Vaccine status: Up to date  Pneumococcal vaccine status: Up to date  Covid-19 vaccine status: Completed vaccines  Qualifies for Shingles Vaccine? Yes   Zostavax completed Yes   Shingrix Completed?: No.    Education has been provided regarding the importance of this vaccine. Patient has been advised to call insurance company to determine out of pocket expense if they have not yet received this vaccine. Advised may also receive vaccine at local pharmacy or Health Dept. Verbalized acceptance and understanding.  Screening Tests Health Maintenance  Topic Date Due   Zoster Vaccines- Shingrix (1 of 2) Never done   OPHTHALMOLOGY EXAM  02/25/2019   COVID-19 Vaccine (5 - Booster for Moderna series) 03/28/2020   FOOT EXAM  05/21/2021   URINE MICROALBUMIN  05/30/2021   HEMOGLOBIN A1C  11/05/2021   TETANUS/TDAP  11/03/2022   Pneumonia Vaccine 19+ Years old  Completed   INFLUENZA VACCINE  Completed   HPV VACCINES  Aged Out    Health Maintenance  Health Maintenance Due  Topic Date Due   Zoster  Vaccines- Shingrix (1 of 2) Never done   OPHTHALMOLOGY EXAM  02/25/2019   COVID-19 Vaccine (5 - Booster for Moderna series) 03/28/2020   FOOT EXAM  05/21/2021   URINE MICROALBUMIN  05/30/2021    Colorectal cancer screening: No longer required.   Lung Cancer Screening: (Low Dose CT Chest recommended if Age 61-80 years, 30 pack-year currently smoking OR have quit w/in 15years.) does not qualify.    Additional Screening:  Hepatitis C Screening: does not qualify;   Vision Screening: Recommended annual ophthalmology exams for early detection of glaucoma and other disorders of the eye. Is the patient up to date with their annual eye exam?  No  Who is the provider or what is the name of the office in which the patient attends annual eye exams? N/A If pt is not established with a provider, would they like to be referred to a provider to establish care? No .   Dental Screening: Recommended annual dental exams for proper oral hygiene  Community Resource Referral / Chronic Care Management: CRR required this visit?  No   CCM required this visit?  No      Plan:     I have personally reviewed and noted the following in the patient's chart:   Medical and social history Use of alcohol, tobacco or illicit drugs  Current medications and supplements including opioid prescriptions. Patient is not currently taking opioid prescriptions. Functional ability and status Nutritional status Physical activity Advanced directives List of other physicians Hospitalizations, surgeries, and ER visits in previous 12 months Vitals Screenings to include cognitive, depression, and falls Referrals and appointments  In addition, I have reviewed and discussed with patient certain preventive protocols, quality metrics, and best practice recommendations. A written personalized care plan for preventive services as well as general preventive health recommendations were provided to patient.     [name] Driver, LPN   [id number]   Nurse Notes: Visit completed with pt's son, Jacqulynn Cadet due to  patient's dementia. Pt is up to date on all age appropriate health maintenance and vaccines.

## 2021-06-24 DIAGNOSIS — E7800 Pure hypercholesterolemia, unspecified: Secondary | ICD-10-CM | POA: Diagnosis not present

## 2021-06-24 DIAGNOSIS — E118 Type 2 diabetes mellitus with unspecified complications: Secondary | ICD-10-CM | POA: Diagnosis not present

## 2021-06-24 DIAGNOSIS — I10 Essential (primary) hypertension: Secondary | ICD-10-CM | POA: Diagnosis not present

## 2021-07-09 ENCOUNTER — Telehealth: Payer: Self-pay | Admitting: Pharmacist

## 2021-07-09 NOTE — Progress Notes (Signed)
Chronic Care Management Pharmacy Assistant   Name: [name]  MRN: [id number] DOB: 1928/08/08   Reason for Encounter: Disease State - Diabetes Call   Recent office visits:  06/13/21 - Annual Medicare Wellness completed.   Recent consult visits:  06/05/21 Garey Ham FNP - Gastroenterology - Labs ordered. No medication changes. Follow up as needed.   05/07/21 Elayne Snare, MD - Internal Medicine - Diabetes - labs were ordered. Discussed trial of Ozempic. start the injections with the 0.25 mg dosage weekly for the first 4 injections and then go up to 0.5 mg weekly if no continued nausea. Sample given. Flu vaccine administered. Follow up in December.   04/16/21 Hardie Pulley, DPM - Nail Problem - Podiatry - Nails palliatively debrided secondary to pain. Follow up in 4 months.   Hospital visits:  None in previous 6 months  Medications: Outpatient Encounter Medications as of 07/09/2021  Medication Sig   amoxicillin-clavulanate (AUGMENTIN) 875-125 MG tablet Take 1 tablet by mouth 2 (two) times daily. (Patient not taking: Reported on 06/13/2021)   aspirin 81 MG EC tablet Take 1 tablet by mouth daily.   atenolol (TENORMIN) 25 MG tablet TAKE 1 TABLET BY MOUTH IN THE MORNING AND 1/2 TABLET AT BEDTIME   B-D UF III MINI PEN NEEDLES 31G X 5 MM MISC USE 4 TIMES PER DAY   Blood Glucose Monitoring Suppl (ONE TOUCH ULTRA 2) w/Device KIT Use to test blood sugar daily Dx code E11.65   cephALEXin (KEFLEX) 500 MG capsule cephalexin 500 mg capsule  TAKE 1 CAPSULE BY MOUTH THREE TIMES A DAY (Patient not taking: Reported on 06/13/2021)   cholecalciferol (VITAMIN D) 1000 UNITS tablet Take 1,000 Units by mouth daily.   docusate sodium (COLACE) 100 MG capsule Take 100 mg by mouth 2 (two) times daily.   donepezil (ARICEPT) 10 MG tablet TAKE 1 TABLET BY MOUTH EVERYDAY AT BEDTIME   empagliflozin (JARDIANCE) 25 MG TABS tablet Take 1 tablet (25 mg total) by mouth daily with breakfast.   ferrous  gluconate (FERGON) 325 MG tablet Take 325 mg by mouth daily with breakfast.   Lancet Devices (ONE TOUCH DELICA LANCING DEV) MISC Use to check blood sugar three times daily.   memantine (NAMENDA) 10 MG tablet Take 0.5 tablets by mouth 2 (two) times daily.   metFORMIN (GLUCOPHAGE) 500 MG tablet TAKE TWO TABLETS BY MOUTH EVERY MORNING AND TAKE ONE TABLET EVERY EVENING FOR 90 DAYS   mometasone (ELOCON) 0.1 % ointment Apply topically daily.   omeprazole (PRILOSEC) 20 MG capsule Take 20 mg by mouth daily.   OneTouch Delica Lancets 63Z MISC 1 each by Does not apply route in the morning, at noon, and at bedtime. Use to check blood sugar three times daily. DX:E11.65   ONETOUCH VERIO test strip USE AS INSTRUCTED TO CHECK BLOOD SUGAR THREE TIMES A DAY. DX CODE E11.65   repaglinide (PRANDIN) 1 MG tablet Take 1 tablet (1 mg total) by mouth 3 (three) times daily before meals.   simvastatin (ZOCOR) 40 MG tablet TAKE ONE TABLET BY MOUTH EVERY NIGHT AT BEDTIME FOR HYPERLIPIDEMIA   TRESIBA FLEXTOUCH 100 UNIT/ML FlexTouch Pen INJECT 20 UNITS TOTAL INTO THE SKIN DAILY.   Facility-Administered Encounter Medications as of 07/09/2021  Medication   cyanocobalamin ((VITAMIN B-12)) injection 1,000 mcg    Current antihyperglycemic regimen:  Tresiba 100 units/ml 16u daily, Metformin 1000 one tab am and one-half tab at dinner time,  Jardiance 10 mg daily  Repaglinide 1 mg tid  What recent interventions/DTPs have been made to improve glycemic control:  Patients son states patient is no longer taking Ozempic. He has also been unhappy with previous who was prescribing diabetes medications. He has an appointment with PCP Dr Dennard Schaumann today to discuss him taking over his diabetes management.   Have there been any recent hospitalizations or ED visits since last deniesvisit with CPP?    Patient  hypoglycemic symptoms, including Pale, Sweaty, Shaky, Hungry, Nervous/irritable, and Vision changes   Patient denies  hyperglycemic symptoms, including blurry vision, excessive thirst, fatigue, polyuria, and weakness   How often are you checking your blood sugar? Patient's son states patient is checking but was not with patient at the time of the call.    What are your blood sugars ranging? Son was not able to provide readings at the time of the call.  Fasting:  Before meals:  After meals:  Bedtime:   During the week, how often does your blood glucose drop below 70?  Patient's son denied any readings less than 70 in the past week.   Are you checking your feet daily/regularly? Patient's son reported keeping a check on patient's feet regularly.     Adherence Review: Is the patient currently on a STATIN medication? Yes Is the patient currently on ACE/ARB medication? No Does the patient have >5 day gap between last estimated fill dates? No   Tresiba 100 units/ml 16u daily- last filled 06/11/21 75 days  Metformin 1000 one tab am and one-half tab at dinner time - last filled 08/17/20 90 days  Jardiance 10 mg daily - last filled 09/11/20 30 days  Repaglinide 1 mg tid - last filled 02/05/21 30 days    Care Gaps  AWV: 06/13/21 Colonoscopy: aged out DM Eye Exam: overdue 02/25/19 DM Foot Exam: done 04/16/21 Microalbumin: done 05/30/2020 HbgAIC: done 10/11/2 (8.9) DEXA: done 06/29/02 Mammogram: N/A  Star Rating Drugs: Metformin 1000 mg - last filled 08/17/20 90 days  Simvastatin 40 mg - last filled 06/22/21 30 days   Future Appointments  Date Time Provider [place]  07/16/2021 11:00 AM Elayne Snare, MD [facility] None  08/16/2021  4:00 PM [name], DPM [facility]  06/20/2022  3:30 PM [facility]-NURSE HEALTH ADVISOR [facility] None    Jobe Gibbon, [facility] Clinical Pharmacist Assistant  [phone]

## 2021-07-11 DIAGNOSIS — Z20822 Contact with and (suspected) exposure to covid-19: Secondary | ICD-10-CM | POA: Diagnosis not present

## 2021-07-15 ENCOUNTER — Ambulatory Visit (INDEPENDENT_AMBULATORY_CARE_PROVIDER_SITE_OTHER): Payer: Medicare Other | Admitting: Family Medicine

## 2021-07-15 ENCOUNTER — Other Ambulatory Visit: Payer: Self-pay

## 2021-07-15 ENCOUNTER — Encounter: Payer: Self-pay | Admitting: Family Medicine

## 2021-07-15 VITALS — BP 108/68 | HR 72 | Resp 18 | Ht 68.0 in | Wt 194.0 lb

## 2021-07-15 DIAGNOSIS — E119 Type 2 diabetes mellitus without complications: Secondary | ICD-10-CM | POA: Diagnosis not present

## 2021-07-15 DIAGNOSIS — Z794 Long term (current) use of insulin: Secondary | ICD-10-CM

## 2021-07-15 MED ORDER — OMEPRAZOLE 20 MG PO CPDR: 20.0000 mg | DELAYED_RELEASE_CAPSULE | Freq: Every day | ORAL | 3 refills | Status: DC

## 2021-07-15 MED ORDER — EMPAGLIFLOZIN 25 MG PO TABS: 25.0000 mg | ORAL_TABLET | Freq: Every day | ORAL | 1 refills | Status: DC

## 2021-07-15 MED ORDER — ATENOLOL 25 MG PO TABS: ORAL_TABLET | ORAL | 1 refills | Status: DC

## 2021-07-15 MED ORDER — DONEPEZIL HCL 10 MG PO TABS: ORAL_TABLET | ORAL | 1 refills | Status: DC

## 2021-07-15 MED ORDER — SIMVASTATIN 40 MG PO TABS: ORAL_TABLET | ORAL | 1 refills | Status: DC

## 2021-07-15 MED ORDER — METFORMIN HCL 500 MG PO TABS
ORAL_TABLET | ORAL | 3 refills | Status: DC
Start: 2021-07-15 — End: 2021-12-12

## 2021-07-15 MED ORDER — MEMANTINE HCL 10 MG PO TABS: 5.0000 mg | ORAL_TABLET | Freq: Two times a day (BID) | ORAL | 3 refills | Status: DC

## 2021-07-15 MED ORDER — REPAGLINIDE 1 MG PO TABS: 1.0000 mg | ORAL_TABLET | Freq: Three times a day (TID) | ORAL | 1 refills | Status: DC

## 2021-07-15 NOTE — Progress Notes (Signed)
Subjective:    Patient ID: [name], male    DOB: [birth year], 86 y.o.   MRN: [id number]  Patient is a very pleasant 87 year old Caucasian gentleman who is here today with his son.  He has a history of insulin-dependent diabetes mellitus.  He is currently on Tresiba 16 units daily, Jardiance 25 mg daily, Prandin 1 mg 3 times a day with meals and metformin 1000 mg in the morning and 500 mg in the evening.  His most recent A1c in October was 8.9.  At that point, Ozempic was recommended to him by his endocrinologist.  The patient lost a tremendous amount of strength due to poor appetite was having a difficult time even getting out of the chair.  Due to his dementia he is extremely sensitive to medication changes.  He frequently has hypoglycemic episodes with minor adjustments in his insulin.  Due to the frailty of the patient and his worsening dementia, his son transferred his care to here just for convenience.  At this point, we have decided to focus on quality of life.  Based on his frailty, I believe an A1c between 8 and 8.5 is acceptable.  The patient states that his blood sugars typically in the morning are between 150 and 200.  His son states that it is seldom higher than 200.  They have not seen any hypoglycemic episodes recently. Past Medical History:  Diagnosis Date   B12 deficiency    BPH (benign prostatic hypertrophy)    Colon polyps    Diabetes mellitus    GERD (gastroesophageal reflux disease)    hiatal hernia   History of kidney stones    Hyperlipidemia    Hypertension    Osteoarthritis    Past Surgical History:  Procedure Laterality Date   APPENDECTOMY     CARDIOVASCULAR STRESS TEST  09/02/2011   Post stress myocardial perfusion images show normal pattern of perfusion in all areas. No significant wall abnormalites noted.   CAROTID DOPPLER  01/27/2011   Right & Left ICAs 0-49% diameter reduction, right & left subclavian arteries demonstrated less than 50% diameter reduction.    CYSTOSCOPY WITH BIOPSY  06/17/2012   Procedure: CYSTOSCOPY WITH BIOPSY;  Surgeon: Molli Hazard, MD;  Location: [facility];  Service: Urology;  Laterality: N/A;   KNEE CARTILAGE SURGERY     Arthroscope   Partial amputaion left foot     TRANSTHORACIC ECHOCARDIOGRAM  09/02/2011   EF >55%, normal LV systolic function, mild diastolic dysfunction   TRANSURETHRAL RESECTION OF PROSTATE  06/17/2012   Procedure: TRANSURETHRAL RESECTION OF THE PROSTATE WITH GYRUS INSTRUMENTS;  Surgeon: Molli Hazard, MD;  Location: [facility];  Service: Urology;  Laterality: N/A;   Current Outpatient Medications on File Prior to Visit  Medication Sig Dispense Refill   atenolol (TENORMIN) 25 MG tablet TAKE 1 TABLET BY MOUTH IN THE MORNING AND 1/2 TABLET AT BEDTIME 405 tablet 1   cholecalciferol (VITAMIN D) 1000 UNITS tablet Take 1,000 Units by mouth daily.     docusate sodium (COLACE) 100 MG capsule Take 100 mg by mouth 2 (two) times daily.     donepezil (ARICEPT) 10 MG tablet TAKE 1 TABLET BY MOUTH EVERYDAY AT BEDTIME 90 tablet 1   empagliflozin (JARDIANCE) 25 MG TABS tablet Take 1 tablet (25 mg total) by mouth daily with breakfast. 90 tablet 1   ferrous gluconate (FERGON) 325 MG tablet Take 325 mg by mouth daily with breakfast.     memantine (NAMENDA) 10 MG  tablet Take 0.5 tablets by mouth 2 (two) times daily.     metFORMIN (GLUCOPHAGE) 500 MG tablet TAKE TWO TABLETS BY MOUTH EVERY MORNING AND TAKE ONE TABLET EVERY EVENING FOR 90 DAYS     omeprazole (PRILOSEC) 20 MG capsule Take 20 mg by mouth daily.     repaglinide (PRANDIN) 1 MG tablet Take 1 tablet (1 mg total) by mouth 3 (three) times daily before meals. 90 tablet 1   simvastatin (ZOCOR) 40 MG tablet TAKE ONE TABLET BY MOUTH EVERY NIGHT AT BEDTIME FOR HYPERLIPIDEMIA     TRESIBA FLEXTOUCH 100 UNIT/ML FlexTouch Pen INJECT 20 UNITS TOTAL INTO THE SKIN DAILY. (Patient taking differently: 16 Units daily.) 15 mL 2   aspirin 81 MG EC tablet Take 1 tablet by  mouth daily. (Patient not taking: Reported on 07/15/2021)     B-D UF III MINI PEN NEEDLES 31G X 5 MM MISC USE 4 TIMES PER DAY 100 each 4   Blood Glucose Monitoring Suppl (ONE TOUCH ULTRA 2) w/Device KIT Use to test blood sugar daily Dx code E11.65 1 kit 3   Lancet Devices (ONE TOUCH DELICA LANCING DEV) MISC Use to check blood sugar three times daily. 1 each 0   mometasone (ELOCON) 0.1 % ointment Apply topically daily. 45 g 0   OneTouch Delica Lancets 56L MISC 1 each by Does not apply route in the morning, at noon, and at bedtime. Use to check blood sugar three times daily. DX:E11.65 100 each 3   ONETOUCH VERIO test strip USE AS INSTRUCTED TO CHECK BLOOD SUGAR THREE TIMES A DAY. DX CODE E11.65 100 strip 12   Current Facility-Administered Medications on File Prior to Visit  Medication Dose Route Frequency Provider Last Rate Last Admin   cyanocobalamin ((VITAMIN B-12)) injection 1,000 mcg  1,000 mcg Intramuscular Q30 days Susy Frizzle, MD   1,000 mcg at 09/28/18 1629   Allergies  Allergen Reactions   Avodart [Dutasteride] Swelling   Ibuprofen Nausea And Vomiting   Morphine Nausea And Vomiting   Social History   Socioeconomic History   Marital status: Widowed    Spouse name: Not on file   Number of children: 1   Years of education: Not on file   Highest education level: Not on file  Occupational History   Occupation: Retired  Tobacco Use   Smoking status: Former   Smokeless tobacco: Never  Substance and Sexual Activity   Alcohol use: No    Alcohol/week: 0.0 standard drinks   Drug use: No   Sexual activity: Not Currently    Comment: married to [place], retired.  Other Topics Concern   Not on file  Social History Narrative   Wife Stanton Kidney died in October 14, 2017.   Has sitters 5 days per week. Lives next door to son, Jacqulynn Cadet.   Social Determinants of Health   Financial Resource Strain: Low Risk    Difficulty of Paying Living Expenses: Not hard at all  Food Insecurity: No Food Insecurity    Worried About Charity fundraiser in the Last Year: Never true   [place] in the Last Year: Never true  Transportation Needs: No Transportation Needs   Lack of Transportation (Medical): No   Lack of Transportation (Non-Medical): No  Physical Activity: Inactive   Days of Exercise per Week: 0 days   Minutes of Exercise per Session: 0 min  Stress: No Stress Concern Present   Feeling of Stress : Not at all  Social Connections: Moderately Isolated  Frequency of Communication with Friends and Family: More than three times a week   Frequency of Social Gatherings with Friends and Family: More than three times a week   Attends Religious Services: 1 to 4 times per year   Active Member of [employer] or Organizations: No   Attends Archivist Meetings: Never   Marital Status: Widowed  Human resources officer Violence: Not At Risk   Fear of Current or Ex-Partner: No   Emotionally Abused: No   Physically Abused: No   Sexually Abused: No     Review of Systems  All other systems reviewed and are negative.     Objective:   Physical Exam Constitutional:      Appearance: He is not ill-appearing or toxic-appearing.  HENT:     Nose:     Right Sinus: Frontal sinus tenderness present.     Left Sinus: Frontal sinus tenderness present.  Cardiovascular:     Rate and Rhythm: Normal rate and regular rhythm.     Heart sounds: Normal heart sounds. No murmur heard. Pulmonary:     Effort: Pulmonary effort is normal. No respiratory distress.     Breath sounds: Normal breath sounds. No wheezing, rhonchi or rales.  Neurological:     Mental Status: He is alert.          Assessment & Plan:  Type 2 diabetes mellitus without complication, with long-term current use of insulin (HCC) - Plan: Hemoglobin A1c, COMPLETE METABOLIC PANEL WITH GFR  At this point, I want to focus on quality of life.  Hemoglobin A1c between 8 and 8.5 I believe is acceptable given the patient's advanced age and frailty.   If we can keep his blood sugars between 102 100, I believe the best acceptable.  I will obtain an A1c and a CMP today.  I will not make drastic changes in his diabetic regimen unless much higher than 9.  Patient and his son are comfortable with this.

## 2021-07-16 ENCOUNTER — Encounter (HOSPITAL_COMMUNITY): Payer: Self-pay | Admitting: *Deleted

## 2021-07-16 ENCOUNTER — Telehealth: Payer: Self-pay | Admitting: Family Medicine

## 2021-07-16 ENCOUNTER — Ambulatory Visit: Payer: Medicare Other | Admitting: Endocrinology

## 2021-07-16 ENCOUNTER — Emergency Department (HOSPITAL_COMMUNITY): Payer: Medicare Other

## 2021-07-16 ENCOUNTER — Emergency Department (HOSPITAL_COMMUNITY)
Admission: EM | Admit: 2021-07-16 | Discharge: 2021-07-16 | Disposition: A | Discharge status: Home / Self Care | Payer: Medicare Other | Attending: Emergency Medicine | Admitting: Emergency Medicine

## 2021-07-16 ENCOUNTER — Telehealth: Payer: Self-pay

## 2021-07-16 ENCOUNTER — Other Ambulatory Visit: Payer: Self-pay | Admitting: Family Medicine

## 2021-07-16 ENCOUNTER — Other Ambulatory Visit: Payer: Self-pay

## 2021-07-16 DIAGNOSIS — E1165 Type 2 diabetes mellitus with hyperglycemia: Secondary | ICD-10-CM | POA: Diagnosis not present

## 2021-07-16 DIAGNOSIS — Z7984 Long term (current) use of oral hypoglycemic drugs: Secondary | ICD-10-CM | POA: Diagnosis not present

## 2021-07-16 DIAGNOSIS — S300XXA Contusion of lower back and pelvis, initial encounter: Secondary | ICD-10-CM

## 2021-07-16 DIAGNOSIS — N182 Chronic kidney disease, stage 2 (mild): Secondary | ICD-10-CM | POA: Diagnosis not present

## 2021-07-16 DIAGNOSIS — R410 Disorientation, unspecified: Secondary | ICD-10-CM | POA: Insufficient documentation

## 2021-07-16 DIAGNOSIS — I129 Hypertensive chronic kidney disease with stage 1 through stage 4 chronic kidney disease, or unspecified chronic kidney disease: Secondary | ICD-10-CM | POA: Diagnosis not present

## 2021-07-16 DIAGNOSIS — Z79899 Other long term (current) drug therapy: Secondary | ICD-10-CM | POA: Diagnosis not present

## 2021-07-16 DIAGNOSIS — Z7982 Long term (current) use of aspirin: Secondary | ICD-10-CM | POA: Insufficient documentation

## 2021-07-16 DIAGNOSIS — R4182 Altered mental status, unspecified: Secondary | ICD-10-CM | POA: Diagnosis not present

## 2021-07-16 DIAGNOSIS — E860 Dehydration: Secondary | ICD-10-CM | POA: Diagnosis not present

## 2021-07-16 DIAGNOSIS — F0390 Unspecified dementia without behavioral disturbance: Secondary | ICD-10-CM | POA: Insufficient documentation

## 2021-07-16 DIAGNOSIS — Z794 Long term (current) use of insulin: Secondary | ICD-10-CM | POA: Diagnosis not present

## 2021-07-16 DIAGNOSIS — E1122 Type 2 diabetes mellitus with diabetic chronic kidney disease: Secondary | ICD-10-CM | POA: Diagnosis not present

## 2021-07-16 DIAGNOSIS — R739 Hyperglycemia, unspecified: Secondary | ICD-10-CM

## 2021-07-16 DIAGNOSIS — Z87891 Personal history of nicotine dependence: Secondary | ICD-10-CM | POA: Insufficient documentation

## 2021-07-16 DIAGNOSIS — N179 Acute kidney failure, unspecified: Secondary | ICD-10-CM

## 2021-07-16 DIAGNOSIS — W010XXA Fall on same level from slipping, tripping and stumbling without subsequent striking against object, initial encounter: Secondary | ICD-10-CM

## 2021-07-16 HISTORY — DX: Unspecified dementia, unspecified severity, without behavioral disturbance, psychotic disturbance, mood disturbance, and anxiety: F03.90

## 2021-07-16 LAB — URINALYSIS, MICROSCOPIC (REFLEX)

## 2021-07-16 LAB — CBC
HCT: 37.1 % — ABNORMAL LOW (ref 39.0–52.0)
Hemoglobin: 12.2 g/dL — ABNORMAL LOW (ref 13.0–17.0)
MCH: 33.8 pg (ref 26.0–34.0)
MCHC: 32.9 g/dL (ref 30.0–36.0)
MCV: 102.8 fL — ABNORMAL HIGH (ref 80.0–100.0)
Platelets: 142 10*3/uL — ABNORMAL LOW (ref 150–400)
RBC: 3.61 MIL/uL — ABNORMAL LOW (ref 4.22–5.81)
RDW: 13.1 % (ref 11.5–15.5)
WBC: 12.6 10*3/uL — ABNORMAL HIGH (ref 4.0–10.5)
nRBC: 0 % (ref 0.0–0.2)

## 2021-07-16 LAB — URINALYSIS, ROUTINE W REFLEX MICROSCOPIC
Bilirubin Urine: NEGATIVE
Glucose, UA: 250 mg/dL — AB
Ketones, ur: NEGATIVE mg/dL
Leukocytes,Ua: NEGATIVE
Nitrite: NEGATIVE
Protein, ur: NEGATIVE mg/dL
Specific Gravity, Urine: 1.01 (ref 1.005–1.030)
pH: 5 (ref 5.0–8.0)

## 2021-07-16 LAB — COMPLETE METABOLIC PANEL WITH GFR
AG Ratio: 1.6 (calc) (ref 1.0–2.5)
ALT: 20 U/L (ref 9–46)
AST: 20 U/L (ref 10–35)
Albumin: 4.2 g/dL (ref 3.6–5.1)
Alkaline phosphatase (APISO): 126 U/L (ref 35–144)
BUN/Creatinine Ratio: 16 (calc) (ref 6–22)
BUN: 29 mg/dL — ABNORMAL HIGH (ref 7–25)
CO2: 25 mmol/L (ref 20–32)
Calcium: 9.3 mg/dL (ref 8.6–10.3)
Chloride: 107 mmol/L (ref 98–110)
Creat: 1.84 mg/dL — ABNORMAL HIGH (ref 0.70–1.22)
Globulin: 2.7 g/dL (calc) (ref 1.9–3.7)
Glucose, Bld: 255 mg/dL — ABNORMAL HIGH (ref 65–99)
Potassium: 5.6 mmol/L — ABNORMAL HIGH (ref 3.5–5.3)
Sodium: 142 mmol/L (ref 135–146)
Total Bilirubin: 0.4 mg/dL (ref 0.2–1.2)
Total Protein: 6.9 g/dL (ref 6.1–8.1)
eGFR: 34 mL/min/{1.73_m2} — ABNORMAL LOW (ref >=60)

## 2021-07-16 LAB — COMPREHENSIVE METABOLIC PANEL
ALT: 23 U/L (ref 0–44)
AST: 25 U/L (ref 15–41)
Albumin: 3.7 g/dL (ref 3.5–5.0)
Alkaline Phosphatase: 76 U/L (ref 38–126)
Anion gap: 10 (ref 5–15)
BUN: 35 mg/dL — ABNORMAL HIGH (ref 8–23)
CO2: 23 mmol/L (ref 22–32)
Calcium: 8.7 mg/dL — ABNORMAL LOW (ref 8.9–10.3)
Chloride: 104 mmol/L (ref 98–111)
Creatinine, Ser: 1.82 mg/dL — ABNORMAL HIGH (ref 0.61–1.24)
GFR, Estimated: 35 mL/min — ABNORMAL LOW (ref >60)
Glucose, Bld: 290 mg/dL — ABNORMAL HIGH (ref 70–99)
Potassium: 4.7 mmol/L (ref 3.5–5.1)
Sodium: 137 mmol/L (ref 135–145)
Total Bilirubin: 0.8 mg/dL (ref 0.3–1.2)
Total Protein: 6.6 g/dL (ref 6.5–8.1)

## 2021-07-16 LAB — HEMOGLOBIN A1C
Hgb A1c MFr Bld: 8.2 % of total Hgb — ABNORMAL HIGH (ref <5.7)
Mean Plasma Glucose: 189 mg/dL
eAG (mmol/L): 10.4 mmol/L

## 2021-07-16 LAB — CBG MONITORING, ED
Glucose-Capillary: 352 mg/dL — ABNORMAL HIGH (ref 70–99)
Glucose-Capillary: 223 mg/dL — ABNORMAL HIGH (ref 70–99)

## 2021-07-16 MED ORDER — INSULIN ASPART 100 UNIT/ML IJ SOLN
8.0000 [IU] | Freq: Once | INTRAMUSCULAR | Status: AC
  Administered 2021-07-16: 20:00:00 8 [IU] via SUBCUTANEOUS
  Filled 2021-07-16: qty 1

## 2021-07-16 MED ORDER — SODIUM CHLORIDE 0.9 % IV BOLUS
500.0000 mL | Freq: Once | INTRAVENOUS | Status: AC
  Administered 2021-07-16: 21:00:00 500 mL via INTRAVENOUS

## 2021-07-16 MED ORDER — SODIUM CHLORIDE 0.9 % IV BOLUS
1000.0000 mL | Freq: Once | INTRAVENOUS | Status: AC
  Administered 2021-07-16: 20:00:00 1000 mL via INTRAVENOUS

## 2021-07-16 MED ORDER — INSULIN LISPRO 100 UNIT/ML IJ SOLN: INTRAMUSCULAR | 11 refills | Status: DC

## 2021-07-16 NOTE — Discharge Instructions (Addendum)
It was our pleasure to provide your ER care today - we hope that you feel better.  Your blood sugar was high today - this can lead to dehydration. From today's labs your kidney function test (creatinine 1.8) was also mildly high.  Drink plenty of water/fluids - stay well hydrated. Continue your diabetes meds. Follow diabetes meal plan. Check sugars and record values. And follow up closely with your doctor in the next 1-2 weeks.   Return to ER if worse, new symptoms, fevers, new or severe pain, persistent vomiting, weak/fainting, trouble breathing, or other emergency concern.

## 2021-07-16 NOTE — ED Triage Notes (Signed)
Pt with blood sugars in the 300's since yesterday. 374 PTA per family member.

## 2021-07-16 NOTE — Telephone Encounter (Signed)
Spoke with pt's son. He reports pt did eat a full lunch. BG is still in the upper 300s, but pt does seem to be feeling somewhat better. He did cancel appt today with Dr. Dwyane Dee. Advised him to try and r/s this as soon as possible. Also advised him to monitor pt and if Humalog does not bring insulin to a more normal level pt may need to be evaluated. He will call us if pt is not continuing to improve. Nothing further needed at this time.

## 2021-07-16 NOTE — Telephone Encounter (Signed)
LMTRC

## 2021-07-16 NOTE — ED Notes (Addendum)
Patient transported to CT 

## 2021-07-16 NOTE — Telephone Encounter (Signed)
Received call from patient's son [name] to follow up on yesterday's visit. States patient refused shot and now has elevated blood sugar level of over 400. Requesting appt to see provider asap.  Please advise at [phone].

## 2021-07-16 NOTE — Telephone Encounter (Signed)
Left message for patients son to call back regarding results and recommendations.

## 2021-07-16 NOTE — Telephone Encounter (Signed)
Patient's son Dellis Filbert called to report patient having difficulty sitting up in chair; possible fall over night.   May need x-ray. Please advise at [phone].

## 2021-07-16 NOTE — Telephone Encounter (Signed)
Spoke with pt's son and he states pt did not have [place] yesterday, caregiver did not give it to him. He also reports pt is not eating and has been hallucinating. He is concerned this may be due to elevated BG which has been in the 300-400 range since yesterday.  Per Dr. Dennard Schaumann he will send in Humalog to be used as a fast-acting insulin. Son requested rx sent to [facility]. Rx sent by provider.  Also advised son to assess pt once he is with him and to let us know if he needs anything further. Did advise pt may need to go to ED if he is not doing well.

## 2021-07-16 NOTE — Telephone Encounter (Signed)
-----   Message from Susy Frizzle, MD sent at 07/16/2021  6:49 AM EST ----- Hemoglobin A1c looks very good for the patient at 8.2.  I am happy with this and would not add additional medication.  Please let his son know I.  However his renal function has worsened.  Creatinine has risen from 1.3-1.8.  This likely reflects dehydration.  We will try to encourage the patient to drink more fluid.  Would like to recheck his renal function in 1 week after drinking more fluid to make sure that this is not worsening.

## 2021-07-16 NOTE — ED Provider Notes (Addendum)
[facility] EMERGENCY DEPARTMENT Provider Note   CSN: [id number] Arrival date & time: 07/16/21  1850     History Chief Complaint  Patient presents with   Hyperglycemia    [name] is a 87 y.o. male.  Patient c/o generalized weakness in past couple days, with high blood sugar in 300s. Missed his normal diabetic shot yesterday, but in general, pt/family compliant w meds. Is drinking fluids, urinating normal amount if not more frequently. No abd pain or nvd. No fevers. Had negative covid test today. Pcp called in new diabetes medication today, but sugar was in mid 300s. Did have fall last night - at baseline, somewhat unsteady on feet, and with dementia - was able to get up under own power, and has been ambulatory since. No cough or uri symptoms. No chest pain. No sob.   The history is provided by the patient, medical records and a relative. The history is limited by the condition of the patient.  Hyperglycemia Associated symptoms: confusion   Associated symptoms: no fever and no vomiting       Past Medical History:  Diagnosis Date   B12 deficiency    BPH (benign prostatic hypertrophy)    Colon polyps    Dementia ([place])    per family member   Diabetes mellitus    GERD (gastroesophageal reflux disease)    hiatal hernia   History of kidney stones    Hyperlipidemia    Hypertension    Osteoarthritis     Patient Active Problem List   Diagnosis Date Noted   Encounter for therapeutic drug level monitoring 05/30/2021   Esophageal reflux 05/30/2021   Osteoarthrosis, pelvic region and thigh 05/30/2021   Benign essential hypertension 05/30/2021   Pain due to onychomycosis of toenails of both feet 01/24/2019   Diabetes mellitus without complication ([place]) 95/62/1308   Pain in right knee 08/09/2018   Type 2 diabetes mellitus without complication ([place]) 65/78/4696   PAC (premature atrial contraction) 05/21/2015   S/P TURP (status post transurethral resection of prostate)  11/16/2013   UTI (urinary tract infection) 11/16/2013   Aortic sclerosis 11/16/2013   Chronic kidney disease, stage II (mild) 08/02/2013   Hypertension    Hyperlipidemia    B12 deficiency    Type II or unspecified type diabetes mellitus without mention of complication, uncontrolled 03/01/2013   Pure hypercholesterolemia 03/01/2013   BPH (benign prostatic hypertrophy) 10/21/2010   Rhinitis 10/21/2010   History of kidney stones 10/21/2010   UNSPECIFIED ANEMIA 03/26/2009   NONSPECIFIC ABNORMAL FINDING IN STOOL CONTENTS 03/26/2009   PERSONAL HISTORY OF COLONIC POLYPS 03/26/2009    Past Surgical History:  Procedure Laterality Date   APPENDECTOMY     CARDIOVASCULAR STRESS TEST  09/02/2011   Post stress myocardial perfusion images show normal pattern of perfusion in all areas. No significant wall abnormalites noted.   CAROTID DOPPLER  01/27/2011   Right & Left ICAs 0-49% diameter reduction, right & left subclavian arteries demonstrated less than 50% diameter reduction.   CYSTOSCOPY WITH BIOPSY  06/17/2012   Procedure: CYSTOSCOPY WITH BIOPSY;  Surgeon: Molli Hazard, MD;  Location: [facility];  Service: Urology;  Laterality: N/A;   KNEE CARTILAGE SURGERY     Arthroscope   Partial amputaion left foot     TRANSTHORACIC ECHOCARDIOGRAM  09/02/2011   EF >55%, normal LV systolic function, mild diastolic dysfunction   TRANSURETHRAL RESECTION OF PROSTATE  06/17/2012   Procedure: TRANSURETHRAL RESECTION OF THE PROSTATE WITH GYRUS INSTRUMENTS;  Surgeon: Quillian Quince  Julieanne Cotton, MD;  Location: [facility];  Service: Urology;  Laterality: N/A;       Family History  Problem Relation Age of Onset   Heart attack Mother    Hypertension Mother     Social History   Tobacco Use   Smoking status: Former   Smokeless tobacco: Never  Substance Use Topics   Alcohol use: No    Alcohol/week: 0.0 standard drinks   Drug use: No    Home Medications Prior to Admission medications   Medication Sig Start Date  End Date Taking? Authorizing Provider  aspirin 81 MG EC tablet Take 1 tablet by mouth daily. Patient not taking: Reported on 07/15/2021 11/21/09   [provider]  atenolol (TENORMIN) 25 MG tablet TAKE 1 TABLET BY MOUTH IN THE MORNING AND 1/2 TABLET AT BEDTIME 07/15/21   Susy Frizzle, MD  B-D UF III MINI PEN NEEDLES 31G X 5 MM MISC USE 4 TIMES PER DAY 12/31/20   Elayne Snare, MD  Blood Glucose Monitoring Suppl (ONE TOUCH ULTRA 2) w/Device KIT Use to test blood sugar daily Dx code E11.65 04/30/20   Susy Frizzle, MD  cholecalciferol (VITAMIN D) 1000 UNITS tablet Take 1,000 Units by mouth daily.    [provider]  docusate sodium (COLACE) 100 MG capsule Take 100 mg by mouth 2 (two) times daily.    [provider]  donepezil (ARICEPT) 10 MG tablet TAKE 1 TABLET BY MOUTH EVERYDAY AT BEDTIME 07/15/21   Susy Frizzle, MD  empagliflozin (JARDIANCE) 25 MG TABS tablet Take 1 tablet (25 mg total) by mouth daily with breakfast. 07/15/21   Susy Frizzle, MD  ferrous gluconate (FERGON) 325 MG tablet Take 325 mg by mouth daily with breakfast.    [provider]  insulin lispro (HUMALOG) 100 UNIT/ML injection For sugars 150-200-2 units, 201-250-4 units, 251-300-6 units, 301-350-8 units, 351+ give 10 units 07/16/21   Susy Frizzle, MD  Lancet Devices (ONE TOUCH DELICA LANCING DEV) MISC Use to check blood sugar three times daily. 09/14/19   Elayne Snare, MD  memantine (NAMENDA) 10 MG tablet Take 0.5 tablets (5 mg total) by mouth 2 (two) times daily. 07/15/21   Susy Frizzle, MD  metFORMIN (GLUCOPHAGE) 500 MG tablet TAKE TWO TABLETS BY MOUTH EVERY MORNING AND TAKE ONE TABLET EVERY EVENING FOR 90 DAYS 07/15/21   Susy Frizzle, MD  mometasone (ELOCON) 0.1 % ointment Apply topically daily. 12/21/14   Susy Frizzle, MD  omeprazole (PRILOSEC) 20 MG capsule Take 1 capsule (20 mg total) by mouth daily. 07/15/21   Susy Frizzle, MD  OneTouch Delica Lancets  30Q MISC 1 each by Does not apply route in the morning, at noon, and at bedtime. Use to check blood sugar three times daily. DX:E11.65 09/16/19   Elayne Snare, MD  ONETOUCH VERIO test strip USE AS INSTRUCTED TO CHECK BLOOD SUGAR THREE TIMES A DAY. DX CODE E11.65 09/11/20   Elayne Snare, MD  repaglinide (PRANDIN) 1 MG tablet Take 1 tablet (1 mg total) by mouth 3 (three) times daily before meals. 07/15/21   Susy Frizzle, MD  simvastatin (ZOCOR) 40 MG tablet TAKE ONE TABLET BY MOUTH EVERY NIGHT AT BEDTIME FOR HYPERLIPIDEMIA 07/15/21   Susy Frizzle, MD  TRESIBA FLEXTOUCH 100 UNIT/ML FlexTouch Pen INJECT 20 UNITS TOTAL INTO THE SKIN DAILY. Patient taking differently: 16 Units daily. 04/30/21   Elayne Snare, MD    Allergies    Avodart [dutasteride], Ibuprofen,  and Morphine  Review of Systems   Review of Systems  Unable to perform ROS: Dementia  Constitutional:  Negative for fever.  Respiratory:  Negative for cough.   Gastrointestinal:  Negative for vomiting.  Psychiatric/Behavioral:  Positive for confusion.   Dementia - level 5 caveat   Physical Exam Updated Vital Signs BP (!) 116/55 (BP Location: Left Arm)    Pulse 67    Temp 97.6 F (36.4 C) (Oral)    Resp 20    Ht 1.727 m (5' 8" )    Wt 88.9 kg    SpO2 91%    BMI 29.80 kg/m   Physical Exam Vitals and nursing note reviewed.  Constitutional:      Appearance: Normal appearance. He is well-developed.  HENT:     Head: Atraumatic.     Nose: Nose normal.     Mouth/Throat:     Mouth: Mucous membranes are moist.     Pharynx: Oropharynx is clear.  Eyes:     General: No scleral icterus.    Conjunctiva/sclera: Conjunctivae normal.     Pupils: Pupils are equal, round, and reactive to light.  Neck:     Vascular: No carotid bruit.     Trachea: No tracheal deviation.     Comments: No stiffness or rigidity.  Cardiovascular:     Rate and Rhythm: Normal rate and regular rhythm.     Pulses: Normal pulses.     Heart sounds: Normal heart  sounds. No murmur heard.   No friction rub. No gallop.  Pulmonary:     Effort: Pulmonary effort is normal. No accessory muscle usage or respiratory distress.     Breath sounds: Normal breath sounds.  Abdominal:     General: Bowel sounds are normal. There is no distension.     Palpations: Abdomen is soft.     Tenderness: There is no abdominal tenderness.  Genitourinary:    Comments: No cva tenderness. Musculoskeletal:        General: No swelling or tenderness.     Cervical back: Normal range of motion and neck supple. No rigidity.     Comments: CTLS spine, non tender, aligned, no step off. Good rom bilateral extremities without pain or focal bony tenderness.  Contusion/bruise right buttock. No pain w rom hip or extremities, ambulatory.   Skin:    General: Skin is warm and dry.     Findings: No rash.  Neurological:     Mental Status: He is alert.     Comments: Alert, speech clear. Motor/sens grossly intact bil.   Psychiatric:        Mood and Affect: Mood normal.    ED Results / Procedures / Treatments   Labs (all labs ordered are listed, but only abnormal results are displayed) Results for orders placed or performed during the hospital encounter of 07/16/21  Comprehensive metabolic panel  Result Value Ref Range   Sodium 137 135 - 145 mmol/L   Potassium 4.7 3.5 - 5.1 mmol/L   Chloride 104 98 - 111 mmol/L   CO2 23 22 - 32 mmol/L   Glucose, Bld 290 (H) 70 - 99 mg/dL   BUN 35 (H) 8 - 23 mg/dL   Creatinine, Ser 1.82 (H) 0.61 - 1.24 mg/dL   Calcium 8.7 (L) 8.9 - 10.3 mg/dL   Total Protein 6.6 6.5 - 8.1 g/dL   Albumin 3.7 3.5 - 5.0 g/dL   AST 25 15 - 41 U/L   ALT 23 0 - 44 U/L  Alkaline Phosphatase 76 38 - 126 U/L   Total Bilirubin 0.8 0.3 - 1.2 mg/dL   GFR, Estimated 35 (L) >60 mL/min   Anion gap 10 5 - 15  CBC  Result Value Ref Range   WBC 12.6 (H) 4.0 - 10.5 K/uL   RBC 3.61 (L) 4.22 - 5.81 MIL/uL   Hemoglobin 12.2 (L) 13.0 - 17.0 g/dL   HCT 37.1 (L) 39.0 - 52.0 %    MCV 102.8 (H) 80.0 - 100.0 fL   MCH 33.8 26.0 - 34.0 pg   MCHC 32.9 30.0 - 36.0 g/dL   RDW 13.1 11.5 - 15.5 %   Platelets 142 (L) 150 - 400 K/uL   nRBC 0.0 0.0 - 0.2 %  Urinalysis, Routine w reflex microscopic Urine, Clean Catch  Result Value Ref Range   Color, Urine YELLOW YELLOW   APPearance CLEAR CLEAR   Specific Gravity, Urine 1.010 1.005 - 1.030   pH 5.0 5.0 - 8.0   Glucose, UA 250 (A) NEGATIVE mg/dL   Hgb urine dipstick TRACE (A) NEGATIVE   Bilirubin Urine NEGATIVE NEGATIVE   Ketones, ur NEGATIVE NEGATIVE mg/dL   Protein, ur NEGATIVE NEGATIVE mg/dL   Nitrite NEGATIVE NEGATIVE   Leukocytes,Ua NEGATIVE NEGATIVE  Urinalysis, Microscopic (reflex)  Result Value Ref Range   RBC / HPF 0-5 0 - 5 RBC/hpf   WBC, UA 0-5 0 - 5 WBC/hpf   Bacteria, UA RARE (A) NONE SEEN   Squamous Epithelial / LPF 0-5 0 - 5  POC CBG, ED  Result Value Ref Range   Glucose-Capillary 352 (H) 70 - 99 mg/dL  CBG monitoring, ED  Result Value Ref Range   Glucose-Capillary 223 (H) 70 - 99 mg/dL     EKG None  Radiology CT HEAD WO CONTRAST (5MM)  Result Date: 07/16/2021 CLINICAL DATA:  Altered mental status EXAM: CT HEAD WITHOUT CONTRAST TECHNIQUE: Contiguous axial images were obtained from the base of the skull through the vertex without intravenous contrast. COMPARISON:  09/28/2018 FINDINGS: Brain: No evidence of acute infarction, hemorrhage, hydrocephalus, extra-axial collection or mass lesion/mass effect. Chronic atrophic and ischemic changes are noted. This is stable from the prior exam. Vascular: No hyperdense vessel or unexpected calcification. Skull: Normal. Negative for fracture or focal lesion. Sinuses/Orbits: No acute finding. Other: None. IMPRESSION: Chronic atrophic and ischemic changes as described. Electronically Signed   By: Inez Catalina M.D.   On: 07/16/2021 20:40    Procedures Procedures   Medications Ordered in ED Medications  sodium chloride 0.9 % bolus 1,000 mL (has no administration  in time range)  insulin aspart (novoLOG) injection 8 Units (has no administration in time range)    ED Course  I have reviewed the triage vital signs and the nursing notes.  Pertinent labs & imaging results that were available during my care of the patient were reviewed by me and considered in my medical decision making (see chart for details).    MDM Rules/Calculators/A&P                         Labs sent.   Reviewed nursing notes and prior charts for additional history.   CBG high, 352, ns bolus iv. Novolog sq.  Labs reviewed/interpreted by me - glucose high, hco3 normal.   CT reviewed/interpreted by me - no hem.   Additional ivf.   Recheck blood glucose improved from prior.   Po fluids/food. No ad pain or nv. No cp or sob.   ?  mild dehydration due to hyperglycemia. Ivf given in ED.   Pt currently appears stable for d/c.   Rec close pcp f/u.  Return precautions provided.      Final Clinical Impression(s) / ED Diagnoses Final diagnoses:  None    Rx / DC Orders ED Discharge Orders     None         Lajean Saver, MD 07/16/21 2238

## 2021-07-17 DIAGNOSIS — Z20822 Contact with and (suspected) exposure to covid-19: Secondary | ICD-10-CM | POA: Diagnosis not present

## 2021-07-18 DIAGNOSIS — Z20822 Contact with and (suspected) exposure to covid-19: Secondary | ICD-10-CM | POA: Diagnosis not present

## 2021-07-19 DIAGNOSIS — Z20822 Contact with and (suspected) exposure to covid-19: Secondary | ICD-10-CM | POA: Diagnosis not present

## 2021-07-23 DIAGNOSIS — J181 Lobar pneumonia, unspecified organism: Secondary | ICD-10-CM | POA: Diagnosis not present

## 2021-07-23 DIAGNOSIS — J069 Acute upper respiratory infection, unspecified: Secondary | ICD-10-CM | POA: Diagnosis not present

## 2021-07-23 DIAGNOSIS — J029 Acute pharyngitis, unspecified: Secondary | ICD-10-CM | POA: Diagnosis not present

## 2021-07-24 DIAGNOSIS — E7800 Pure hypercholesterolemia, unspecified: Secondary | ICD-10-CM | POA: Diagnosis not present

## 2021-07-24 DIAGNOSIS — I10 Essential (primary) hypertension: Secondary | ICD-10-CM | POA: Diagnosis not present

## 2021-07-24 DIAGNOSIS — E118 Type 2 diabetes mellitus with unspecified complications: Secondary | ICD-10-CM | POA: Diagnosis not present

## 2021-08-09 DIAGNOSIS — Z20822 Contact with and (suspected) exposure to covid-19: Secondary | ICD-10-CM | POA: Diagnosis not present

## 2021-08-16 ENCOUNTER — Ambulatory Visit: Payer: Medicare Other | Admitting: Podiatry

## 2021-08-17 DIAGNOSIS — Z20822 Contact with and (suspected) exposure to covid-19: Secondary | ICD-10-CM | POA: Diagnosis not present

## 2021-08-19 DIAGNOSIS — E7800 Pure hypercholesterolemia, unspecified: Secondary | ICD-10-CM | POA: Diagnosis not present

## 2021-08-19 DIAGNOSIS — I10 Essential (primary) hypertension: Secondary | ICD-10-CM | POA: Diagnosis not present

## 2021-08-19 DIAGNOSIS — E118 Type 2 diabetes mellitus with unspecified complications: Secondary | ICD-10-CM | POA: Diagnosis not present

## 2021-08-20 DIAGNOSIS — I10 Essential (primary) hypertension: Secondary | ICD-10-CM | POA: Diagnosis not present

## 2021-08-20 DIAGNOSIS — E118 Type 2 diabetes mellitus with unspecified complications: Secondary | ICD-10-CM | POA: Diagnosis not present

## 2021-08-20 DIAGNOSIS — E7800 Pure hypercholesterolemia, unspecified: Secondary | ICD-10-CM | POA: Diagnosis not present

## 2021-08-21 DIAGNOSIS — Z20822 Contact with and (suspected) exposure to covid-19: Secondary | ICD-10-CM | POA: Diagnosis not present

## 2021-08-22 DIAGNOSIS — Z20822 Contact with and (suspected) exposure to covid-19: Secondary | ICD-10-CM | POA: Diagnosis not present

## 2021-08-23 DIAGNOSIS — I10 Essential (primary) hypertension: Secondary | ICD-10-CM | POA: Diagnosis not present

## 2021-08-23 DIAGNOSIS — E7800 Pure hypercholesterolemia, unspecified: Secondary | ICD-10-CM | POA: Diagnosis not present

## 2021-08-23 DIAGNOSIS — E118 Type 2 diabetes mellitus with unspecified complications: Secondary | ICD-10-CM | POA: Diagnosis not present

## 2021-08-26 DIAGNOSIS — Z20822 Contact with and (suspected) exposure to covid-19: Secondary | ICD-10-CM | POA: Diagnosis not present

## 2021-09-02 DIAGNOSIS — Z20822 Contact with and (suspected) exposure to covid-19: Secondary | ICD-10-CM | POA: Diagnosis not present

## 2021-09-03 ENCOUNTER — Other Ambulatory Visit: Payer: Self-pay

## 2021-09-03 ENCOUNTER — Ambulatory Visit (INDEPENDENT_AMBULATORY_CARE_PROVIDER_SITE_OTHER): Payer: Medicare Other | Admitting: Podiatry

## 2021-09-03 DIAGNOSIS — M79675 Pain in left toe(s): Secondary | ICD-10-CM

## 2021-09-03 DIAGNOSIS — B351 Tinea unguium: Secondary | ICD-10-CM

## 2021-09-03 DIAGNOSIS — L84 Corns and callosities: Secondary | ICD-10-CM | POA: Diagnosis not present

## 2021-09-03 DIAGNOSIS — E119 Type 2 diabetes mellitus without complications: Secondary | ICD-10-CM

## 2021-09-03 DIAGNOSIS — Z20828 Contact with and (suspected) exposure to other viral communicable diseases: Secondary | ICD-10-CM | POA: Diagnosis not present

## 2021-09-03 DIAGNOSIS — M79674 Pain in right toe(s): Secondary | ICD-10-CM | POA: Diagnosis not present

## 2021-09-03 NOTE — Progress Notes (Signed)
°  Subjective:  Patient ID: [name], male    DOB: 15-Oct-1928,  MRN: [id number]  No chief complaint on file.  88 y.o. male presents with the above complaint. History confirmed with patient. Last saw PCP Susy Frizzle, MD Jan 2023. Last A1c 8.2  Objective:  Physical Exam: warm, good capillary refill, nail exam onychomycosis of the toenails and pain to palpation, no trophic changes or ulcerative lesions. DP pulses palpable, PT pulses palpable and protective sensation intact Left Foot: HPK noted at distal and plantar hallux stump without warmth, erythema, signs of acute infection noted. No open ulcer upon debridement. 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  Assessment:   1. Pain due to onychomycosis of toenails of both feet   2. Diabetes mellitus without complication ([place])   3. Callus     Plan:  Patient was evaluated and treated and all questions answered.  Onychomycosis, DM -Nails debrided due to pain, DM  Procedure: Nail Debridement Type of Debridement: manual, sharp debridement. Instrumentation: Nail nipper, rotary burr. Number of Nails: 10 Disposition: Patient tolerated well. Small bleed left 3rd toe cauterized with silver nitrate.  Procedure: Paring of Lesion Rationale: painful hyperkeratotic lesion Type of Debridement: manual, sharp debridement. Instrumentation: 312 blade Number of Lesions: 1   Return in about 3 months (around 12/01/2021) for Diabetic Foot Care.

## 2021-09-06 DIAGNOSIS — Z20822 Contact with and (suspected) exposure to covid-19: Secondary | ICD-10-CM | POA: Diagnosis not present

## 2021-09-09 DIAGNOSIS — Z20822 Contact with and (suspected) exposure to covid-19: Secondary | ICD-10-CM | POA: Diagnosis not present

## 2021-09-16 DIAGNOSIS — Z20822 Contact with and (suspected) exposure to covid-19: Secondary | ICD-10-CM | POA: Diagnosis not present

## 2021-09-17 DIAGNOSIS — Z20822 Contact with and (suspected) exposure to covid-19: Secondary | ICD-10-CM | POA: Diagnosis not present

## 2021-09-18 DIAGNOSIS — Z20822 Contact with and (suspected) exposure to covid-19: Secondary | ICD-10-CM | POA: Diagnosis not present

## 2021-09-19 DIAGNOSIS — E7800 Pure hypercholesterolemia, unspecified: Secondary | ICD-10-CM | POA: Diagnosis not present

## 2021-09-19 DIAGNOSIS — E118 Type 2 diabetes mellitus with unspecified complications: Secondary | ICD-10-CM | POA: Diagnosis not present

## 2021-09-19 DIAGNOSIS — I10 Essential (primary) hypertension: Secondary | ICD-10-CM | POA: Diagnosis not present

## 2021-09-19 DIAGNOSIS — Z20822 Contact with and (suspected) exposure to covid-19: Secondary | ICD-10-CM | POA: Diagnosis not present

## 2021-09-20 DIAGNOSIS — E118 Type 2 diabetes mellitus with unspecified complications: Secondary | ICD-10-CM | POA: Diagnosis not present

## 2021-09-20 DIAGNOSIS — I10 Essential (primary) hypertension: Secondary | ICD-10-CM | POA: Diagnosis not present

## 2021-09-20 DIAGNOSIS — E7800 Pure hypercholesterolemia, unspecified: Secondary | ICD-10-CM | POA: Diagnosis not present

## 2021-09-22 ENCOUNTER — Other Ambulatory Visit: Payer: Self-pay | Admitting: Endocrinology

## 2021-09-23 DIAGNOSIS — Z20822 Contact with and (suspected) exposure to covid-19: Secondary | ICD-10-CM | POA: Diagnosis not present

## 2021-09-25 DIAGNOSIS — Z20822 Contact with and (suspected) exposure to covid-19: Secondary | ICD-10-CM | POA: Diagnosis not present

## 2021-09-26 DIAGNOSIS — Z20822 Contact with and (suspected) exposure to covid-19: Secondary | ICD-10-CM | POA: Diagnosis not present

## 2021-10-01 DIAGNOSIS — Z20822 Contact with and (suspected) exposure to covid-19: Secondary | ICD-10-CM | POA: Diagnosis not present

## 2021-10-09 DIAGNOSIS — Z20822 Contact with and (suspected) exposure to covid-19: Secondary | ICD-10-CM | POA: Diagnosis not present

## 2021-10-10 DIAGNOSIS — Z20822 Contact with and (suspected) exposure to covid-19: Secondary | ICD-10-CM | POA: Diagnosis not present

## 2021-10-11 DIAGNOSIS — Z20822 Contact with and (suspected) exposure to covid-19: Secondary | ICD-10-CM | POA: Diagnosis not present

## 2021-10-15 DIAGNOSIS — Z20822 Contact with and (suspected) exposure to covid-19: Secondary | ICD-10-CM | POA: Diagnosis not present

## 2021-10-18 DIAGNOSIS — Z20822 Contact with and (suspected) exposure to covid-19: Secondary | ICD-10-CM | POA: Diagnosis not present

## 2021-10-21 DIAGNOSIS — Z20822 Contact with and (suspected) exposure to covid-19: Secondary | ICD-10-CM | POA: Diagnosis not present

## 2021-10-21 DIAGNOSIS — E7800 Pure hypercholesterolemia, unspecified: Secondary | ICD-10-CM | POA: Diagnosis not present

## 2021-10-21 DIAGNOSIS — E118 Type 2 diabetes mellitus with unspecified complications: Secondary | ICD-10-CM | POA: Diagnosis not present

## 2021-10-21 DIAGNOSIS — I10 Essential (primary) hypertension: Secondary | ICD-10-CM | POA: Diagnosis not present

## 2021-10-26 DIAGNOSIS — Z20822 Contact with and (suspected) exposure to covid-19: Secondary | ICD-10-CM | POA: Diagnosis not present

## 2021-10-31 ENCOUNTER — Ambulatory Visit (INDEPENDENT_AMBULATORY_CARE_PROVIDER_SITE_OTHER): Payer: Medicare Other | Admitting: Family Medicine

## 2021-10-31 VITALS — BP 132/68 | HR 63 | Temp 96.7°F | Ht 68.0 in | Wt 190.4 lb

## 2021-10-31 DIAGNOSIS — E119 Type 2 diabetes mellitus without complications: Secondary | ICD-10-CM | POA: Diagnosis not present

## 2021-10-31 DIAGNOSIS — Z794 Long term (current) use of insulin: Secondary | ICD-10-CM | POA: Diagnosis not present

## 2021-10-31 DIAGNOSIS — L03116 Cellulitis of left lower limb: Secondary | ICD-10-CM | POA: Diagnosis not present

## 2021-10-31 DIAGNOSIS — L02416 Cutaneous abscess of left lower limb: Secondary | ICD-10-CM

## 2021-10-31 MED ORDER — CEPHALEXIN 500 MG PO CAPS: 500.0000 mg | ORAL_CAPSULE | Freq: Three times a day (TID) | ORAL | 0 refills | Status: DC

## 2021-10-31 NOTE — Progress Notes (Signed)
? ?Subjective:  ? ? Patient ID: [name], male    DOB: 1928-10-17, 88 y.o.   MRN: [id number] ? ?Unfortunately, the patient continues to decline.  His level of alertness is less than his baseline.  He is more confused.  He is following more.  He is less communicative.  His son and I had a discussion today and elected to make the patient DNR/DNI which I believe is appropriate.  There are concerns because his left leg has become erythematous warm and swollen.  From the mid shin to his foot, the leg is erythematous, warm to the touch and markedly swollen compared to the right lower extremity.  He has palpable dorsalis pedis pulses bilaterally.  He has a negative Homans' sign.  He is ambulatory around the home so his risk for DVT is low.  The differential diagnosis includes a DVT versus cellulitis.  We discussed going to the emergency room to get an ultrasound however I feel this is most likely cellulitis and this time would like to try empiric treatment with antibiotics. ?Past Medical History:  ?Diagnosis Date  ? B12 deficiency   ? BPH (benign prostatic hypertrophy)   ? Colon polyps   ? Dementia ([place])   ? per family member  ? Diabetes mellitus   ? GERD (gastroesophageal reflux disease)   ? hiatal hernia  ? History of kidney stones   ? Hyperlipidemia   ? Hypertension   ? Osteoarthritis   ? ?Past Surgical History:  ?Procedure Laterality Date  ? APPENDECTOMY    ? CARDIOVASCULAR STRESS TEST  09/02/2011  ? Post stress myocardial perfusion images show normal pattern of perfusion in all areas. No significant wall abnormalites noted.  ? CAROTID DOPPLER  01/27/2011  ? Right & Left ICAs 0-49% diameter reduction, right & left subclavian arteries demonstrated less than 50% diameter reduction.  ? CYSTOSCOPY WITH BIOPSY  06/17/2012  ? Procedure: CYSTOSCOPY WITH BIOPSY;  Surgeon: Molli Hazard, MD;  Location: [facility];  Service: Urology;  Laterality: N/A;  ? KNEE CARTILAGE SURGERY    ? Arthroscope  ? Partial amputaion left  foot    ? TRANSTHORACIC ECHOCARDIOGRAM  09/02/2011  ? EF >55%, normal LV systolic function, mild diastolic dysfunction  ? TRANSURETHRAL RESECTION OF PROSTATE  06/17/2012  ? Procedure: TRANSURETHRAL RESECTION OF THE PROSTATE WITH GYRUS INSTRUMENTS;  Surgeon: Molli Hazard, MD;  Location: [facility];  Service: Urology;  Laterality: N/A;  ? ?Current Outpatient Medications on File Prior to Visit  ?Medication Sig Dispense Refill  ? aspirin 81 MG EC tablet Take 1 tablet by mouth daily. (Patient not taking: Reported on 07/15/2021)    ? atenolol (TENORMIN) 25 MG tablet TAKE 1 TABLET BY MOUTH IN THE MORNING AND 1/2 TABLET AT BEDTIME (Patient taking differently: Take 12.5 mg by mouth 2 (two) times daily.) 90 tablet 1  ? B-D UF III MINI PEN NEEDLES 31G X 5 MM MISC USE 4 TIMES PER DAY 100 each 4  ? Blood Glucose Monitoring Suppl (ONE TOUCH ULTRA 2) w/Device KIT Use to test blood sugar daily ?Dx code E11.65 1 kit 3  ? cholecalciferol (VITAMIN D) 1000 UNITS tablet Take 1,000 Units by mouth daily.    ? docusate sodium (COLACE) 100 MG capsule Take 100 mg by mouth 2 (two) times daily.    ? donepezil (ARICEPT) 10 MG tablet TAKE 1 TABLET BY MOUTH EVERYDAY AT BEDTIME 90 tablet 1  ? empagliflozin (JARDIANCE) 25 MG TABS tablet Take 1 tablet (25 mg  total) by mouth daily with breakfast. 90 tablet 1  ? ferrous gluconate (FERGON) 325 MG tablet Take 325 mg by mouth daily with breakfast.    ? insulin lispro (HUMALOG) 100 UNIT/ML injection For sugars 150-200-2 units, 201-250-4 units, 251-300-6 units, 301-350-8 units, 351+ give 10 units 10 mL 11  ? Lancet Devices (ONE TOUCH DELICA LANCING DEV) MISC Use to check blood sugar three times daily. 1 each 0  ? memantine (NAMENDA) 10 MG tablet Take 0.5 tablets (5 mg total) by mouth 2 (two) times daily. 180 tablet 3  ? metFORMIN (GLUCOPHAGE) 500 MG tablet TAKE TWO TABLETS BY MOUTH EVERY MORNING AND TAKE ONE TABLET EVERY EVENING FOR 90 DAYS 270 tablet 3  ? mometasone (ELOCON) 0.1 % ointment Apply  topically daily. (Patient not taking: Reported on 07/16/2021) 45 g 0  ? omeprazole (PRILOSEC) 20 MG capsule Take 1 capsule (20 mg total) by mouth daily. 90 capsule 3  ? OneTouch Delica Lancets 76P MISC 1 each by Does not apply route in the morning, at noon, and at bedtime. Use to check blood sugar three times daily. DX:E11.65 100 each 3  ? ONETOUCH VERIO test strip USE AS INSTRUCTED TO CHECK BLOOD SUGAR THREE TIMES A DAY. DX CODE E11.65 100 strip 12  ? repaglinide (PRANDIN) 1 MG tablet Take 1 tablet (1 mg total) by mouth 3 (three) times daily before meals. 270 tablet 1  ? simvastatin (ZOCOR) 40 MG tablet TAKE ONE TABLET BY MOUTH EVERY NIGHT AT BEDTIME FOR HYPERLIPIDEMIA 90 tablet 1  ? TRESIBA FLEXTOUCH 100 UNIT/ML FlexTouch Pen INJECT 20 UNITS TOTAL INTO THE SKIN DAILY. (Patient taking differently: 16 Units daily.) 15 mL 2  ? ?Current Facility-Administered Medications on File Prior to Visit  ?Medication Dose Route Frequency Provider Last Rate Last Admin  ? cyanocobalamin ((VITAMIN B-12)) injection 1,000 mcg  1,000 mcg Intramuscular Q30 days Susy Frizzle, MD   1,000 mcg at 09/28/18 1629  ? ?Allergies  ?Allergen Reactions  ? Avodart [Dutasteride] Swelling  ? Ibuprofen Nausea And Vomiting  ? Morphine Nausea And Vomiting  ? ?Social History  ? ?Socioeconomic History  ? Marital status: Widowed  ?  Spouse name: Not on file  ? Number of children: 1  ? Years of education: Not on file  ? Highest education level: Not on file  ?Occupational History  ? Occupation: Retired  ?Tobacco Use  ? Smoking status: Former  ? Smokeless tobacco: Never  ?Substance and Sexual Activity  ? Alcohol use: No  ?  Alcohol/week: 0.0 standard drinks  ? Drug use: No  ? Sexual activity: Not Currently  ?  Comment: married to [place], retired.  ?Other Topics Concern  ? Not on file  ?Social History Narrative  ? Wife Stanton Kidney died in 2017/10/17.  ? Has sitters 5 days per week. Lives next door to son, Jacqulynn Cadet.  ? ?Social Determinants of Health  ? ?Financial Resource  Strain: Low Risk   ? Difficulty of Paying Living Expenses: Not hard at all  ?Food Insecurity: No Food Insecurity  ? Worried About Charity fundraiser in the Last Year: Never true  ? Ran Out of Food in the Last Year: Never true  ?Transportation Needs: No Transportation Needs  ? Lack of Transportation (Medical): No  ? Lack of Transportation (Non-Medical): No  ?Physical Activity: Inactive  ? Days of Exercise per Week: 0 days  ? Minutes of Exercise per Session: 0 min  ?Stress: No Stress Concern Present  ? Feeling of Stress :  Not at all  ?Social Connections: Moderately Isolated  ? Frequency of Communication with Friends and Family: More than three times a week  ? Frequency of Social Gatherings with Friends and Family: More than three times a week  ? Attends Religious Services: 1 to 4 times per year  ? Active Member of Clubs or Organizations: No  ? Attends Archivist Meetings: Never  ? Marital Status: Widowed  ?Intimate Partner Violence: Not At Risk  ? Fear of Current or Ex-Partner: No  ? Emotionally Abused: No  ? Physically Abused: No  ? Sexually Abused: No  ? ? ? ?Review of Systems  ?All other systems reviewed and are negative. ? ?   ?Objective:  ? Physical Exam ?Constitutional:   ?   Appearance: He is not ill-appearing or toxic-appearing.  ?Cardiovascular:  ?   Rate and Rhythm: Normal rate and regular rhythm.  ?   Heart sounds: Normal heart sounds. No murmur heard. ?Pulmonary:  ?   Effort: Pulmonary effort is normal. No respiratory distress.  ?   Breath sounds: Normal breath sounds. No wheezing, rhonchi or rales.  ?Musculoskeletal:  ?   Left lower leg: Edema present.  ?     Legs: ? ?Skin: ?   Findings: Erythema present.  ?Neurological:  ?   Mental Status: He is alert. Mental status is at baseline. He is disoriented.  ? ? ? ? ? ?   ?Assessment & Plan:  ?Type 2 diabetes mellitus without complication, with long-term current use of insulin (HCC) - Plan: Hemoglobin A1c, CBC with Differential/Platelet, COMPLETE  METABOLIC PANEL WITH GFR ? ?Cellulitis and abscess of left leg ?Begin Keflex 500 mg p.o. 3 times daily for 7 days and recheck next week or sooner if worse.  If swelling worsens or if he develops severe pain or

## 2021-11-01 DIAGNOSIS — Z20822 Contact with and (suspected) exposure to covid-19: Secondary | ICD-10-CM | POA: Diagnosis not present

## 2021-11-01 LAB — CBC WITH DIFFERENTIAL/PLATELET
Absolute Monocytes: 507 cells/uL (ref 200–950)
Basophils Absolute: 39 cells/uL (ref 0–200)
Basophils Relative: 0.5 %
Eosinophils Absolute: 250 cells/uL (ref 15–500)
Eosinophils Relative: 3.2 %
HCT: 40 % (ref 38.5–50.0)
Hemoglobin: 13 g/dL — ABNORMAL LOW (ref 13.2–17.1)
Lymphs Abs: 1287 cells/uL (ref 850–3900)
MCH: 32.3 pg (ref 27.0–33.0)
MCHC: 32.5 g/dL (ref 32.0–36.0)
MCV: 99.5 fL (ref 80.0–100.0)
MPV: 11.3 fL (ref 7.5–12.5)
Monocytes Relative: 6.5 %
Neutro Abs: 5717 cells/uL (ref 1500–7800)
Neutrophils Relative %: 73.3 %
Platelets: 173 10*3/uL (ref 140–400)
RBC: 4.02 10*6/uL — ABNORMAL LOW (ref 4.20–5.80)
RDW: 12.2 % (ref 11.0–15.0)
Total Lymphocyte: 16.5 %
WBC: 7.8 10*3/uL (ref 3.8–10.8)

## 2021-11-01 LAB — COMPLETE METABOLIC PANEL WITH GFR
AG Ratio: 1.4 (calc) (ref 1.0–2.5)
ALT: 19 U/L (ref 9–46)
AST: 18 U/L (ref 10–35)
Albumin: 3.9 g/dL (ref 3.6–5.1)
Alkaline phosphatase (APISO): 99 U/L (ref 35–144)
BUN/Creatinine Ratio: 23 (calc) — ABNORMAL HIGH (ref 6–22)
BUN: 35 mg/dL — ABNORMAL HIGH (ref 7–25)
CO2: 27 mmol/L (ref 20–32)
Calcium: 9.3 mg/dL (ref 8.6–10.3)
Chloride: 106 mmol/L (ref 98–110)
Creat: 1.53 mg/dL — ABNORMAL HIGH (ref 0.70–1.22)
Globulin: 2.7 g/dL (calc) (ref 1.9–3.7)
Glucose, Bld: 193 mg/dL — ABNORMAL HIGH (ref 65–99)
Potassium: 4.4 mmol/L (ref 3.5–5.3)
Sodium: 141 mmol/L (ref 135–146)
Total Bilirubin: 0.5 mg/dL (ref 0.2–1.2)
Total Protein: 6.6 g/dL (ref 6.1–8.1)
eGFR: 43 mL/min/{1.73_m2} — ABNORMAL LOW (ref >=60)

## 2021-11-01 LAB — HEMOGLOBIN A1C
Hgb A1c MFr Bld: 8.7 % of total Hgb — ABNORMAL HIGH (ref <5.7)
Mean Plasma Glucose: 203 mg/dL
eAG (mmol/L): 11.2 mmol/L

## 2021-11-04 DIAGNOSIS — Z20822 Contact with and (suspected) exposure to covid-19: Secondary | ICD-10-CM | POA: Diagnosis not present

## 2021-11-05 ENCOUNTER — Telehealth: Payer: Self-pay | Admitting: Pharmacist

## 2021-11-05 NOTE — Progress Notes (Signed)
? ? ?  Chronic Care Management ?Pharmacy Assistant  ? ?Name: [name]  MRN: [id number] DOB: August 22, 1928 ? ? ?Reason for Encounter: Disease State - General Adherence Call  ?  ? ?Error: Duplicate ?

## 2021-11-08 DIAGNOSIS — Z20822 Contact with and (suspected) exposure to covid-19: Secondary | ICD-10-CM | POA: Diagnosis not present

## 2021-11-10 DIAGNOSIS — Z20822 Contact with and (suspected) exposure to covid-19: Secondary | ICD-10-CM | POA: Diagnosis not present

## 2021-11-11 DIAGNOSIS — Z20822 Contact with and (suspected) exposure to covid-19: Secondary | ICD-10-CM | POA: Diagnosis not present

## 2021-11-12 DIAGNOSIS — Z20822 Contact with and (suspected) exposure to covid-19: Secondary | ICD-10-CM | POA: Diagnosis not present

## 2021-11-14 DIAGNOSIS — Z20822 Contact with and (suspected) exposure to covid-19: Secondary | ICD-10-CM | POA: Diagnosis not present

## 2021-11-15 DIAGNOSIS — Z20822 Contact with and (suspected) exposure to covid-19: Secondary | ICD-10-CM | POA: Diagnosis not present

## 2021-11-16 DIAGNOSIS — Z20822 Contact with and (suspected) exposure to covid-19: Secondary | ICD-10-CM | POA: Diagnosis not present

## 2021-11-18 DIAGNOSIS — Z20828 Contact with and (suspected) exposure to other viral communicable diseases: Secondary | ICD-10-CM | POA: Diagnosis not present

## 2021-11-19 ENCOUNTER — Telehealth: Payer: Self-pay | Admitting: Pharmacist

## 2021-11-19 NOTE — Progress Notes (Signed)
? ? ?Chronic Care Management ?Pharmacy Assistant  ? ?Name: [name]  MRN: [id number] DOB: 10/17/1928 ? ? ?Reason for Encounter: Disease State - General Adherence Call ? ? ?Recent office visits:  ?None noted ? ?Recent consult visits:  ?None noted ? ?Hospital visits: 07/16/21 ?Medication Reconciliation was completed by comparing discharge summary, patient?s EMR and Pharmacy list, and upon discussion with patient. ?  ?Admitted to the hospital on 07/16/21 due to Hyperglycemia. Discharge date was 07/16/21. Discharged from [facility].   ?  ?New?Medications Started at [facility] Discharge:?? ?None noted.  ?  ?Medication Changes at Hospital Discharge: ?None noted. ?  ?Medications Discontinued at Hospital Discharge: ?None noted.  ?  ?Medications that remain the same after Hospital Discharge:??  ?All other medications will remain the same.   ? ?Medications: ?Outpatient Encounter Medications as of 11/19/2021  ?Medication Sig Note  ? aspirin 81 MG EC tablet Take 1 tablet by mouth daily.   ? atenolol (TENORMIN) 25 MG tablet TAKE 1 TABLET BY MOUTH IN THE MORNING AND 1/2 TABLET AT BEDTIME (Patient taking differently: Take 12.5 mg by mouth 2 (two) times daily.)   ? B-D UF III MINI PEN NEEDLES 31G X 5 MM MISC USE 4 TIMES PER DAY   ? Blood Glucose Monitoring Suppl (ONE TOUCH ULTRA 2) w/Device KIT Use to test blood sugar daily ?Dx code E11.65   ? cephALEXin (KEFLEX) 500 MG capsule Take 1 capsule (500 mg total) by mouth 3 (three) times daily.   ? cholecalciferol (VITAMIN D) 1000 UNITS tablet Take 1,000 Units by mouth daily.   ? docusate sodium (COLACE) 100 MG capsule Take 100 mg by mouth 2 (two) times daily.   ? donepezil (ARICEPT) 10 MG tablet TAKE 1 TABLET BY MOUTH EVERYDAY AT BEDTIME   ? empagliflozin (JARDIANCE) 25 MG TABS tablet Take 1 tablet (25 mg total) by mouth daily with breakfast.   ? ferrous gluconate (FERGON) 325 MG tablet Take 325 mg by mouth daily with breakfast.   ? insulin lispro (HUMALOG) 100 UNIT/ML  injection For sugars 150-200-2 units, 201-250-4 units, 251-300-6 units, 301-350-8 units, 351+ give 10 units (Patient not taking: Reported on 10/31/2021) 07/16/2021: 10 units at home  ? Lancet Devices (ONE TOUCH DELICA LANCING DEV) MISC Use to check blood sugar three times daily.   ? memantine (NAMENDA) 10 MG tablet Take 0.5 tablets (5 mg total) by mouth 2 (two) times daily.   ? metFORMIN (GLUCOPHAGE) 500 MG tablet TAKE TWO TABLETS BY MOUTH EVERY MORNING AND TAKE ONE TABLET EVERY EVENING FOR 90 DAYS   ? mometasone (ELOCON) 0.1 % ointment Apply topically daily.   ? omeprazole (PRILOSEC) 20 MG capsule Take 1 capsule (20 mg total) by mouth daily.   ? OneTouch Delica Lancets 40J MISC 1 each by Does not apply route in the morning, at noon, and at bedtime. Use to check blood sugar three times daily. DX:E11.65   ? ONETOUCH VERIO test strip USE AS INSTRUCTED TO CHECK BLOOD SUGAR THREE TIMES A DAY. DX CODE E11.65   ? repaglinide (PRANDIN) 1 MG tablet Take 1 tablet (1 mg total) by mouth 3 (three) times daily before meals.   ? simvastatin (ZOCOR) 40 MG tablet TAKE ONE TABLET BY MOUTH EVERY NIGHT AT BEDTIME FOR HYPERLIPIDEMIA   ? TRESIBA FLEXTOUCH 100 UNIT/ML FlexTouch Pen INJECT 20 UNITS TOTAL INTO THE SKIN DAILY. (Patient taking differently: 16 Units daily.) 07/16/2021: Took 20 units this morning  ? ?Facility-Administered Encounter Medications as of 11/19/2021  ?Medication  ?  cyanocobalamin ((VITAMIN B-12)) injection 1,000 mcg  ? ?Have you had any problems recently with your health? ?Patient son denied any problems with his health currently. ? ?Have you had any problems with your pharmacy? ?Patient's son denied any problems with his pharmacy. ? ?What issues or side effects are you having with your medications? ?Patient denied any issues or side effects with his current medications.  ? ?What would you like me to pass along to Leata Mouse, CPP for them to help you with?  ?Patient's son didn't have anything to pass along to CPP and  reported he has been doing well currently.  ? ?What can we do to take care of you better?  ?Patient's son did not have any suggestions at this time. ? ?Care Gaps ?  ?AWV: 06/13/21 ?Colonoscopy: aged out ?DM Eye Exam: overdue 02/25/19 ?DM Foot Exam: done 04/16/21 ?Microalbumin: done 05/30/2020 ?HbgAIC: done 10/11/2 (8.9) ?DEXA: done 06/29/02 ?Mammogram: N/A ? ?  ?Star Rating Drugs: ?Metformin 1000 mg - last filled 08/17/20 90 days  ?Simvastatin 40 mg - last filled 09/12/21 30 days  ? ? ?Future Appointments  ?Date Time Provider [place]  ?12/03/2021  3:45 PM Price, Christian Mate, DPM [facility]  ?06/27/2022  2:45 PM [facility]-NURSE HEALTH ADVISOR [facility]  ? ? ? ?Liza Showfety, CCMA ?Clinical Pharmacist Assistant  ?[phone] ? ? ?

## 2021-11-20 DIAGNOSIS — E7800 Pure hypercholesterolemia, unspecified: Secondary | ICD-10-CM | POA: Diagnosis not present

## 2021-11-20 DIAGNOSIS — E118 Type 2 diabetes mellitus with unspecified complications: Secondary | ICD-10-CM | POA: Diagnosis not present

## 2021-11-20 DIAGNOSIS — I10 Essential (primary) hypertension: Secondary | ICD-10-CM | POA: Diagnosis not present

## 2021-11-21 DIAGNOSIS — E118 Type 2 diabetes mellitus with unspecified complications: Secondary | ICD-10-CM | POA: Diagnosis not present

## 2021-11-21 DIAGNOSIS — E7800 Pure hypercholesterolemia, unspecified: Secondary | ICD-10-CM | POA: Diagnosis not present

## 2021-11-21 DIAGNOSIS — I10 Essential (primary) hypertension: Secondary | ICD-10-CM | POA: Diagnosis not present

## 2021-11-22 DIAGNOSIS — E7800 Pure hypercholesterolemia, unspecified: Secondary | ICD-10-CM | POA: Diagnosis not present

## 2021-11-22 DIAGNOSIS — E118 Type 2 diabetes mellitus with unspecified complications: Secondary | ICD-10-CM | POA: Diagnosis not present

## 2021-11-22 DIAGNOSIS — I10 Essential (primary) hypertension: Secondary | ICD-10-CM | POA: Diagnosis not present

## 2021-11-25 DIAGNOSIS — Z20822 Contact with and (suspected) exposure to covid-19: Secondary | ICD-10-CM | POA: Diagnosis not present

## 2021-11-26 DIAGNOSIS — Z20822 Contact with and (suspected) exposure to covid-19: Secondary | ICD-10-CM | POA: Diagnosis not present

## 2021-11-27 DIAGNOSIS — Z20822 Contact with and (suspected) exposure to covid-19: Secondary | ICD-10-CM | POA: Diagnosis not present

## 2021-11-28 DIAGNOSIS — Z23 Encounter for immunization: Secondary | ICD-10-CM | POA: Diagnosis not present

## 2021-11-28 DIAGNOSIS — Z20822 Contact with and (suspected) exposure to covid-19: Secondary | ICD-10-CM | POA: Diagnosis not present

## 2021-11-29 DIAGNOSIS — Z20822 Contact with and (suspected) exposure to covid-19: Secondary | ICD-10-CM | POA: Diagnosis not present

## 2021-11-29 DIAGNOSIS — Z20828 Contact with and (suspected) exposure to other viral communicable diseases: Secondary | ICD-10-CM | POA: Diagnosis not present

## 2021-12-01 ENCOUNTER — Other Ambulatory Visit: Payer: Self-pay

## 2021-12-01 ENCOUNTER — Emergency Department (HOSPITAL_COMMUNITY)
Admission: EM | Admit: 2021-12-01 | Discharge: 2021-12-02 | Disposition: A | Discharge status: Home / Self Care | Payer: Medicare Other | Attending: Emergency Medicine | Admitting: Emergency Medicine

## 2021-12-01 ENCOUNTER — Emergency Department (HOSPITAL_COMMUNITY): Payer: Medicare Other

## 2021-12-01 DIAGNOSIS — Z794 Long term (current) use of insulin: Secondary | ICD-10-CM | POA: Diagnosis not present

## 2021-12-01 DIAGNOSIS — S0081XA Abrasion of other part of head, initial encounter: Secondary | ICD-10-CM | POA: Insufficient documentation

## 2021-12-01 DIAGNOSIS — S2241XA Multiple fractures of ribs, right side, initial encounter for closed fracture: Secondary | ICD-10-CM | POA: Diagnosis not present

## 2021-12-01 DIAGNOSIS — Z7982 Long term (current) use of aspirin: Secondary | ICD-10-CM | POA: Diagnosis not present

## 2021-12-01 DIAGNOSIS — S199XXA Unspecified injury of neck, initial encounter: Secondary | ICD-10-CM | POA: Diagnosis not present

## 2021-12-01 DIAGNOSIS — I10 Essential (primary) hypertension: Secondary | ICD-10-CM | POA: Diagnosis not present

## 2021-12-01 DIAGNOSIS — J939 Pneumothorax, unspecified: Secondary | ICD-10-CM | POA: Diagnosis not present

## 2021-12-01 DIAGNOSIS — Z79899 Other long term (current) drug therapy: Secondary | ICD-10-CM | POA: Insufficient documentation

## 2021-12-01 DIAGNOSIS — E1122 Type 2 diabetes mellitus with diabetic chronic kidney disease: Secondary | ICD-10-CM | POA: Diagnosis not present

## 2021-12-01 DIAGNOSIS — Z7984 Long term (current) use of oral hypoglycemic drugs: Secondary | ICD-10-CM | POA: Insufficient documentation

## 2021-12-01 DIAGNOSIS — S0990XA Unspecified injury of head, initial encounter: Secondary | ICD-10-CM | POA: Diagnosis not present

## 2021-12-01 DIAGNOSIS — I6522 Occlusion and stenosis of left carotid artery: Secondary | ICD-10-CM | POA: Diagnosis not present

## 2021-12-01 DIAGNOSIS — I129 Hypertensive chronic kidney disease with stage 1 through stage 4 chronic kidney disease, or unspecified chronic kidney disease: Secondary | ICD-10-CM | POA: Insufficient documentation

## 2021-12-01 DIAGNOSIS — S299XXA Unspecified injury of thorax, initial encounter: Secondary | ICD-10-CM | POA: Diagnosis present

## 2021-12-01 DIAGNOSIS — K219 Gastro-esophageal reflux disease without esophagitis: Secondary | ICD-10-CM | POA: Insufficient documentation

## 2021-12-01 DIAGNOSIS — M47816 Spondylosis without myelopathy or radiculopathy, lumbar region: Secondary | ICD-10-CM | POA: Diagnosis not present

## 2021-12-01 DIAGNOSIS — N281 Cyst of kidney, acquired: Secondary | ICD-10-CM | POA: Diagnosis not present

## 2021-12-01 DIAGNOSIS — W19XXXA Unspecified fall, initial encounter: Secondary | ICD-10-CM | POA: Diagnosis not present

## 2021-12-01 DIAGNOSIS — R1011 Right upper quadrant pain: Secondary | ICD-10-CM | POA: Diagnosis not present

## 2021-12-01 DIAGNOSIS — R410 Disorientation, unspecified: Secondary | ICD-10-CM | POA: Diagnosis not present

## 2021-12-01 DIAGNOSIS — F0390 Unspecified dementia without behavioral disturbance: Secondary | ICD-10-CM | POA: Insufficient documentation

## 2021-12-01 DIAGNOSIS — S3991XA Unspecified injury of abdomen, initial encounter: Secondary | ICD-10-CM | POA: Diagnosis not present

## 2021-12-01 DIAGNOSIS — R2243 Localized swelling, mass and lump, lower limb, bilateral: Secondary | ICD-10-CM | POA: Insufficient documentation

## 2021-12-01 DIAGNOSIS — J9811 Atelectasis: Secondary | ICD-10-CM | POA: Diagnosis not present

## 2021-12-01 DIAGNOSIS — N182 Chronic kidney disease, stage 2 (mild): Secondary | ICD-10-CM | POA: Diagnosis not present

## 2021-12-01 DIAGNOSIS — I700 Atherosclerosis of aorta: Secondary | ICD-10-CM | POA: Diagnosis not present

## 2021-12-01 DIAGNOSIS — S270XXA Traumatic pneumothorax, initial encounter: Secondary | ICD-10-CM | POA: Insufficient documentation

## 2021-12-01 DIAGNOSIS — I2510 Atherosclerotic heart disease of native coronary artery without angina pectoris: Secondary | ICD-10-CM | POA: Diagnosis not present

## 2021-12-01 LAB — CBC WITH DIFFERENTIAL/PLATELET
Abs Immature Granulocytes: 0.06 10*3/uL (ref 0.00–0.07)
Basophils Absolute: 0.1 10*3/uL (ref 0.0–0.1)
Basophils Relative: 0 %
Eosinophils Absolute: 0.1 10*3/uL (ref 0.0–0.5)
Eosinophils Relative: 1 %
HCT: 40.9 % (ref 39.0–52.0)
Hemoglobin: 13.5 g/dL (ref 13.0–17.0)
Immature Granulocytes: 1 %
Lymphocytes Relative: 5 %
Lymphs Abs: 0.7 10*3/uL (ref 0.7–4.0)
MCH: 32.5 pg (ref 26.0–34.0)
MCHC: 33 g/dL (ref 30.0–36.0)
MCV: 98.3 fL (ref 80.0–100.0)
Monocytes Absolute: 0.7 10*3/uL (ref 0.1–1.0)
Monocytes Relative: 6 %
Neutro Abs: 10.5 10*3/uL — ABNORMAL HIGH (ref 1.7–7.7)
Neutrophils Relative %: 87 %
Platelets: 148 10*3/uL — ABNORMAL LOW (ref 150–400)
RBC: 4.16 MIL/uL — ABNORMAL LOW (ref 4.22–5.81)
RDW: 12.9 % (ref 11.5–15.5)
WBC: 12 10*3/uL — ABNORMAL HIGH (ref 4.0–10.5)
nRBC: 0 % (ref 0.0–0.2)

## 2021-12-01 LAB — COMPREHENSIVE METABOLIC PANEL
ALT: 24 U/L (ref 0–44)
AST: 25 U/L (ref 15–41)
Albumin: 4.2 g/dL (ref 3.5–5.0)
Alkaline Phosphatase: 103 U/L (ref 38–126)
Anion gap: 10 (ref 5–15)
BUN: 33 mg/dL — ABNORMAL HIGH (ref 8–23)
CO2: 24 mmol/L (ref 22–32)
Calcium: 9.2 mg/dL (ref 8.9–10.3)
Chloride: 106 mmol/L (ref 98–111)
Creatinine, Ser: 1.26 mg/dL — ABNORMAL HIGH (ref 0.61–1.24)
GFR, Estimated: 54 mL/min — ABNORMAL LOW (ref >60)
Glucose, Bld: 153 mg/dL — ABNORMAL HIGH (ref 70–99)
Potassium: 3.7 mmol/L (ref 3.5–5.1)
Sodium: 140 mmol/L (ref 135–145)
Total Bilirubin: 0.9 mg/dL (ref 0.3–1.2)
Total Protein: 7.8 g/dL (ref 6.5–8.1)

## 2021-12-01 LAB — URINALYSIS, ROUTINE W REFLEX MICROSCOPIC
Bacteria, UA: NONE SEEN
Bilirubin Urine: NEGATIVE
Glucose, UA: >=500 mg/dL — AB
Ketones, ur: 5 mg/dL — AB
Leukocytes,Ua: NEGATIVE
Nitrite: NEGATIVE
Protein, ur: 30 mg/dL — AB
Specific Gravity, Urine: 1.018 (ref 1.005–1.030)
pH: 5 (ref 5.0–8.0)

## 2021-12-01 MED ORDER — IOHEXOL 300 MG/ML  SOLN
75.0000 mL | Freq: Once | INTRAMUSCULAR | Status: AC | PRN
  Administered 2021-12-01: 75 mL via INTRAVENOUS

## 2021-12-01 MED ORDER — HYDROCODONE-ACETAMINOPHEN 5-325 MG PO TABS
1.0000 | ORAL_TABLET | Freq: Once | ORAL | Status: AC
  Administered 2021-12-01: 1 via ORAL
  Filled 2021-12-01: qty 1

## 2021-12-01 NOTE — ED Provider Notes (Signed)
Care assumed from Dr. Sabra Heck, patient with fall at home and small pneumothorax seen on CT scan. Will need chest x-ray at 0100 to see if pneumothorax is expanding. If not, he will be discharged. ? ?Chest x-ray shows no visible evidence of pneumothorax.  I have independently viewed the image, and agree with the radiologist's interpretation.  Family is advised of that finding.  They are concerned that he may not be able to get get up out of bed, even with assistance.  We will try ambulating him in the emergency department. ? ?Patient was able to ambulate successfully, and he is discharged.  He is given a prescription for oxycodone for pain, but family advised to try to get by with acetaminophen when possible. ?  ?Delora Fuel, MD ?54/49/20 0211 ? ?

## 2021-12-01 NOTE — ED Triage Notes (Addendum)
Pt brought in by [facility], they were called out for an unwitnessed fall with unknown down time, unknown LOC. Pt wearing neck brace upon arrival. Pt was found around 5 pm.  ?Pt c/o R side flank pain. Pt son at bedside to answer most of history.  ?Bruising is noted to L side of pts face.  ?

## 2021-12-01 NOTE — ED Provider Notes (Signed)
Patient is a 88 year old elderly male who lives at home by himself, he does have a history of dementia which has been rapidly progressive recently.  Over the last several months it is worsened significantly and he has now signed a DO NOT RESUSCITATE order with family.  The patient has caregiving 24 hours a day during the week and some help on the weekends.  He did have a caregiver with him this morning, the son brought him lunch at noon and states he was his normal self, when he went to check on him this evening he could not find him and the doors were locked and the lights were out.  He walked down the [employer] where they live and found the patient about 250 yards to 300 yards away from the house on the ground laying on his left side.  He could not get up, he was appropriately confused as per his baseline and complaining of some pain around his right ribs.  On my exam the patient has a soft abdomen which appears nontender, I do not detect any specific tenderness or crepitance or subcutaneous emphysema over the chest wall, he has clear lungs, clear heart sounds other than a soft moderate murmur, he has no edema of his legs and can straight leg raise bilaterally without any difficulty, both arms move full range of motion without any difficulty.  He has no cervical spine tenderness and no obvious injuries to the head except for some abrasions on the left side of the face. ? ?Given the patient's history of dementia he will need CT scans of the head cervical spine chest abdomen and pelvis to make sure we are not missing rib injuries liver injuries spinal injuries or stroke.  Patient is in no distress, son is agreeable to the plan ? ?Medical screening examination/treatment/procedure(s) were conducted as a shared visit with non-physician practitioner(s) and myself.  I personally evaluated the patient during the encounter. ? ?Clinical Impression:  ? ?Final diagnoses:  ?None  ? ? ? ? ? ?  ?Noemi Chapel, MD ?12/03/21  1609 ? ?

## 2021-12-01 NOTE — ED Provider Notes (Signed)
?[facility] ?Provider Note ? ? ?CSN: [id number] ?Arrival date & time: 12/01/21  1833 ? ?  ? ?History ? ?Chief Complaint  ?Patient presents with  ? Fall  ?  R side Flank pain  ? ? ?[name] is a 88 y.o. male. ? ? ?Fall ?Associated symptoms include chest pain and abdominal pain.  ?88 year old male presents emergency department after a fall.  The son accompanies the patient who is the main historian.  Son stated the patient has a baseline of severe dementia of which she has around-the-clock clear during weekdays, but with the family takes turns taking care of the patient on weekends.  Patient currently lives by himself.  Today, the son saw the patient around noon when he brought him lunch.  The son and left go play golf and was not back home till around 5 PM this afternoon.  Son states all of the doors of the house are locked and the lights were out, and the patient was found around 350- 300 yards away from the house laying on his left side.  Patient was confused and complaining of right-sided chest/abdomen pain.  Son states that seems as if the patient had crawled after the fall.  Patient is complaining of right lateral chest wall pain as well as right upper quadrant abdominal pain.  Pain is exacerbated with any kind of movement and is relieved with being still.  Patient denies shortness of breath, nausea/vomiting/diarrhea, fever, chills, night sweats, changes urinary symptoms, changes in bowel habits, headache, dizziness, lightheadedness, passing out.  Patient has a history of urinary incontinence after a TURP procedure done in 2013. ? ?Patient has a past medical history significant for BPH, dementia, diabetes mellitus, GERD, hyperlipidemia, hypertension, osteoarthritis, vitamin B12 deficiency, CKD stage II ?Home Medications ?Prior to Admission medications   ?Medication Sig Start Date End Date Taking? Authorizing Provider  ?aspirin 81 MG EC tablet Take 1 tablet by mouth daily. 11/21/09    [provider]  ?atenolol (TENORMIN) 25 MG tablet TAKE 1 TABLET BY MOUTH IN THE MORNING AND 1/2 TABLET AT BEDTIME ?Patient taking differently: Take 12.5 mg by mouth 2 (two) times daily. 07/15/21   Susy Frizzle, MD  ?B-D UF III MINI PEN NEEDLES 31G X 5 MM MISC USE 4 TIMES PER DAY 12/31/20   Elayne Snare, MD  ?Blood Glucose Monitoring Suppl (ONE TOUCH ULTRA 2) w/Device KIT Use to test blood sugar daily ?Dx code E11.65 04/30/20   Susy Frizzle, MD  ?cephALEXin (KEFLEX) 500 MG capsule Take 1 capsule (500 mg total) by mouth 3 (three) times daily. 10/31/21   Susy Frizzle, MD  ?cholecalciferol (VITAMIN D) 1000 UNITS tablet Take 1,000 Units by mouth daily.    [provider]  ?docusate sodium (COLACE) 100 MG capsule Take 100 mg by mouth 2 (two) times daily.    [provider]  ?donepezil (ARICEPT) 10 MG tablet TAKE 1 TABLET BY MOUTH EVERYDAY AT BEDTIME 07/15/21   Susy Frizzle, MD  ?empagliflozin (JARDIANCE) 25 MG TABS tablet Take 1 tablet (25 mg total) by mouth daily with breakfast. 07/15/21   Susy Frizzle, MD  ?ferrous gluconate (FERGON) 325 MG tablet Take 325 mg by mouth daily with breakfast.    [provider]  ?insulin lispro (HUMALOG) 100 UNIT/ML injection For sugars 150-200-2 units, 201-250-4 units, 251-300-6 units, 301-350-8 units, 351+ give 10 units ?Patient not taking: Reported on 10/31/2021 07/16/21   Susy Frizzle, MD  ?Lancet Devices (ONE  TOUCH DELICA LANCING DEV) MISC Use to check blood sugar three times daily. 09/14/19   Elayne Snare, MD  ?memantine (NAMENDA) 10 MG tablet Take 0.5 tablets (5 mg total) by mouth 2 (two) times daily. 07/15/21   Susy Frizzle, MD  ?metFORMIN (GLUCOPHAGE) 500 MG tablet TAKE TWO TABLETS BY MOUTH EVERY MORNING AND TAKE ONE TABLET EVERY EVENING FOR 90 DAYS 07/15/21   Susy Frizzle, MD  ?mometasone (ELOCON) 0.1 % ointment Apply topically daily. 12/21/14   Susy Frizzle, MD  ?omeprazole (PRILOSEC) 20 MG capsule Take 1  capsule (20 mg total) by mouth daily. 07/15/21   Susy Frizzle, MD  ?Jonetta Speak Lancets 41Y MISC 1 each by Does not apply route in the morning, at noon, and at bedtime. Use to check blood sugar three times daily. DX:E11.65 09/16/19   Elayne Snare, MD  ?Roma Schanz test strip USE AS INSTRUCTED TO CHECK BLOOD SUGAR THREE TIMES A DAY. DX CODE E11.65 09/11/20   Elayne Snare, MD  ?repaglinide (PRANDIN) 1 MG tablet Take 1 tablet (1 mg total) by mouth 3 (three) times daily before meals. 07/15/21   Susy Frizzle, MD  ?simvastatin (ZOCOR) 40 MG tablet TAKE ONE TABLET BY MOUTH EVERY NIGHT AT BEDTIME FOR HYPERLIPIDEMIA 07/15/21   Susy Frizzle, MD  ?TRESIBA FLEXTOUCH 100 UNIT/ML FlexTouch Pen INJECT 20 UNITS TOTAL INTO THE SKIN DAILY. ?Patient taking differently: 16 Units daily. 04/30/21   Elayne Snare, MD  ?   ? ?Allergies    ?Avodart [dutasteride], Ibuprofen, and Morphine   ? ?Review of Systems   ?Review of Systems  ?Constitutional:  Negative for chills and fever.  ?HENT:  Negative for congestion.   ?Cardiovascular:  Positive for chest pain.  ?     Right lower chest wall pain.  ?Gastrointestinal:  Positive for abdominal pain.  ?     Right-sided upper abdominal pain.    ?All other systems reviewed and are negative. ? ?Physical Exam ?Updated Vital Signs ?BP (!) 141/62   Pulse 73   Temp (!) 97.4 ?F (36.3 ?C) (Oral)   Resp 18   Ht _0  (1.778 m)   Wt 86.2 kg   SpO2 93%   BMI 27.26 kg/m?  ?Physical Exam ?Vitals and nursing note reviewed.  ?Constitutional:   ?   Appearance: Normal appearance. He is not ill-appearing, toxic-appearing or diaphoretic.  ?HENT:  ?   Head: Normocephalic.  ?   Comments: Abrasions noted along left side of face. ?   Right Ear: Tympanic membrane, ear canal and external ear normal.  ?   Left Ear: Tympanic membrane, ear canal and external ear normal.  ?   Nose: Nose normal.  ?   Mouth/Throat:  ?   Mouth: Mucous membranes are moist.  ?   Pharynx: Oropharynx is clear.  ?Eyes:  ?    General: No visual field deficit.    ?   Right eye: No discharge.     ?   Left eye: No discharge.  ?   Extraocular Movements: Extraocular movements intact.  ?   Conjunctiva/sclera: Conjunctivae normal.  ?   Pupils: Pupils are equal, round, and reactive to light.  ?Neck:  ?   Comments: Patient evaluated after CT clearance of C-spine tenderness.  Patient elicited no midline tenderness over spinous processes.  Patient was able to full range of motion of neck.  No overlying skin abnormalities including erythema, ecchymosis, areas of induration/fluctuance. ?Cardiovascular:  ?   Rate and Rhythm:  Normal rate and regular rhythm.  ?   Pulses: Normal pulses.  ?   Heart sounds: Normal heart sounds.  ?Pulmonary:  ?   Effort: Pulmonary effort is normal.  ?   Breath sounds: Normal breath sounds.  ?Abdominal:  ?   General: Abdomen is flat.  ?   Palpations: Abdomen is soft.  ?   Tenderness: There is abdominal tenderness. There is no right CVA tenderness, left CVA tenderness, guarding or rebound.  ?   Comments: Tenderness noted in the right upper quadrant of the abdomen.  ?Musculoskeletal:  ?   Cervical back: Normal range of motion and neck supple. No rigidity. No spinous process tenderness.  ?   Comments: Patient has normal range of motion of upper and lower extremities.  Patient elicits tenderness upon palpation of right lateral to posterior lower ribs.  No overlying skin abnormalities.  ?Skin: ?   General: Skin is warm and dry.  ?   Capillary Refill: Capillary refill takes less than 2 seconds.  ?   Comments: 2 old areas of resolving ecchymosis noted laterally to umbilicus.  Abrasion noted on the left side of face.  Otherwise chronic venous stasis changes to lower extremities.  Pitting edema noted in bilateral lower extremities with left side slightly greater than right side.  Patient was recently placed on antibiotic for left leg cellulitis.  ?Neurological:  ?   Mental Status: He is alert.  ?   Sensory: Sensation is intact.  ?    Motor: Motor function is intact. No pronator drift.  ?   Coordination: Finger-Nose-Finger Test and Heel to [place] Test normal.  ?   Comments: Current nerves III through XII grossly intact with no dysarthria/facial asymmet

## 2021-12-02 ENCOUNTER — Emergency Department (HOSPITAL_COMMUNITY): Payer: Medicare Other

## 2021-12-02 DIAGNOSIS — Z20822 Contact with and (suspected) exposure to covid-19: Secondary | ICD-10-CM | POA: Diagnosis not present

## 2021-12-02 DIAGNOSIS — J939 Pneumothorax, unspecified: Secondary | ICD-10-CM | POA: Diagnosis not present

## 2021-12-02 DIAGNOSIS — S2241XA Multiple fractures of ribs, right side, initial encounter for closed fracture: Secondary | ICD-10-CM | POA: Diagnosis not present

## 2021-12-02 MED ORDER — OXYCODONE HCL 5 MG PO TABS: 5.0000 mg | ORAL_TABLET | ORAL | 0 refills | Status: DC | PRN

## 2021-12-02 NOTE — Discharge Instructions (Signed)
Apply ice for thirty minutes at a time, four times a day. ? ?He may take acetaminophen as needed for less severe pain, oxycodone for severe pain. ? ?Rib fractures can take two-three months to heal. ?

## 2021-12-02 NOTE — ED Notes (Signed)
Patient and sons verbalize understanding of discharge instructions. Opportunity for questioning and answers were provided. Armband removed by staff, pt discharged from ED. Wheeled out to lobby with both sons ? ?

## 2021-12-02 NOTE — ED Notes (Signed)
Pt ambulated about 43f in hallway with walker ?

## 2021-12-03 ENCOUNTER — Ambulatory Visit: Payer: Medicare Other | Admitting: Podiatry

## 2021-12-04 DIAGNOSIS — Z20822 Contact with and (suspected) exposure to covid-19: Secondary | ICD-10-CM | POA: Diagnosis not present

## 2021-12-05 DIAGNOSIS — Z20822 Contact with and (suspected) exposure to covid-19: Secondary | ICD-10-CM | POA: Diagnosis not present

## 2021-12-08 ENCOUNTER — Emergency Department (HOSPITAL_COMMUNITY): Payer: Medicare Other

## 2021-12-08 ENCOUNTER — Inpatient Hospital Stay (HOSPITAL_COMMUNITY)
Admission: EM | Admit: 2021-12-08 | Discharge: 2021-12-12 | DRG: 640 | Disposition: A | Discharge status: Hospice | Payer: Medicare Other | Attending: Internal Medicine | Admitting: Internal Medicine

## 2021-12-08 ENCOUNTER — Other Ambulatory Visit: Payer: Self-pay

## 2021-12-08 ENCOUNTER — Encounter (HOSPITAL_COMMUNITY): Payer: Self-pay | Admitting: *Deleted

## 2021-12-08 DIAGNOSIS — Z96641 Presence of right artificial hip joint: Secondary | ICD-10-CM | POA: Diagnosis present

## 2021-12-08 DIAGNOSIS — Z66 Do not resuscitate: Secondary | ICD-10-CM | POA: Diagnosis present

## 2021-12-08 DIAGNOSIS — Z79899 Other long term (current) drug therapy: Secondary | ICD-10-CM

## 2021-12-08 DIAGNOSIS — Z8719 Personal history of other diseases of the digestive system: Secondary | ICD-10-CM | POA: Diagnosis not present

## 2021-12-08 DIAGNOSIS — R404 Transient alteration of awareness: Secondary | ICD-10-CM | POA: Diagnosis not present

## 2021-12-08 DIAGNOSIS — Z8249 Family history of ischemic heart disease and other diseases of the circulatory system: Secondary | ICD-10-CM

## 2021-12-08 DIAGNOSIS — N400 Enlarged prostate without lower urinary tract symptoms: Secondary | ICD-10-CM | POA: Diagnosis present

## 2021-12-08 DIAGNOSIS — Z885 Allergy status to narcotic agent status: Secondary | ICD-10-CM

## 2021-12-08 DIAGNOSIS — R4182 Altered mental status, unspecified: Principal | ICD-10-CM

## 2021-12-08 DIAGNOSIS — Z888 Allergy status to other drugs, medicaments and biological substances status: Secondary | ICD-10-CM | POA: Diagnosis not present

## 2021-12-08 DIAGNOSIS — Z886 Allergy status to analgesic agent status: Secondary | ICD-10-CM

## 2021-12-08 DIAGNOSIS — K219 Gastro-esophageal reflux disease without esophagitis: Secondary | ICD-10-CM | POA: Diagnosis present

## 2021-12-08 DIAGNOSIS — I10 Essential (primary) hypertension: Secondary | ICD-10-CM | POA: Diagnosis not present

## 2021-12-08 DIAGNOSIS — E785 Hyperlipidemia, unspecified: Secondary | ICD-10-CM | POA: Diagnosis present

## 2021-12-08 DIAGNOSIS — Z8673 Personal history of transient ischemic attack (TIA), and cerebral infarction without residual deficits: Secondary | ICD-10-CM

## 2021-12-08 DIAGNOSIS — G9341 Metabolic encephalopathy: Secondary | ICD-10-CM | POA: Diagnosis present

## 2021-12-08 DIAGNOSIS — G9340 Encephalopathy, unspecified: Secondary | ICD-10-CM | POA: Diagnosis not present

## 2021-12-08 DIAGNOSIS — R1310 Dysphagia, unspecified: Secondary | ICD-10-CM

## 2021-12-08 DIAGNOSIS — E1169 Type 2 diabetes mellitus with other specified complication: Secondary | ICD-10-CM

## 2021-12-08 DIAGNOSIS — F0390 Unspecified dementia without behavioral disturbance: Secondary | ICD-10-CM | POA: Diagnosis not present

## 2021-12-08 DIAGNOSIS — S2241XA Multiple fractures of ribs, right side, initial encounter for closed fracture: Secondary | ICD-10-CM | POA: Diagnosis not present

## 2021-12-08 DIAGNOSIS — R0902 Hypoxemia: Secondary | ICD-10-CM | POA: Diagnosis not present

## 2021-12-08 DIAGNOSIS — G928 Other toxic encephalopathy: Secondary | ICD-10-CM | POA: Diagnosis present

## 2021-12-08 DIAGNOSIS — E860 Dehydration: Secondary | ICD-10-CM | POA: Diagnosis present

## 2021-12-08 DIAGNOSIS — W19XXXA Unspecified fall, initial encounter: Secondary | ICD-10-CM | POA: Diagnosis present

## 2021-12-08 DIAGNOSIS — E119 Type 2 diabetes mellitus without complications: Secondary | ICD-10-CM | POA: Diagnosis not present

## 2021-12-08 DIAGNOSIS — Z7982 Long term (current) use of aspirin: Secondary | ICD-10-CM

## 2021-12-08 DIAGNOSIS — F05 Delirium due to known physiological condition: Secondary | ICD-10-CM | POA: Diagnosis present

## 2021-12-08 DIAGNOSIS — S2241XD Multiple fractures of ribs, right side, subsequent encounter for fracture with routine healing: Secondary | ICD-10-CM | POA: Diagnosis not present

## 2021-12-08 DIAGNOSIS — Z794 Long term (current) use of insulin: Secondary | ICD-10-CM | POA: Diagnosis not present

## 2021-12-08 DIAGNOSIS — T402X5A Adverse effect of other opioids, initial encounter: Secondary | ICD-10-CM | POA: Diagnosis present

## 2021-12-08 DIAGNOSIS — Z9079 Acquired absence of other genital organ(s): Secondary | ICD-10-CM

## 2021-12-08 DIAGNOSIS — F03C0 Unspecified dementia, severe, without behavioral disturbance, psychotic disturbance, mood disturbance, and anxiety: Secondary | ICD-10-CM | POA: Diagnosis present

## 2021-12-08 DIAGNOSIS — Z7984 Long term (current) use of oral hypoglycemic drugs: Secondary | ICD-10-CM

## 2021-12-08 DIAGNOSIS — E1165 Type 2 diabetes mellitus with hyperglycemia: Secondary | ICD-10-CM | POA: Diagnosis present

## 2021-12-08 DIAGNOSIS — Z7401 Bed confinement status: Secondary | ICD-10-CM | POA: Diagnosis not present

## 2021-12-08 DIAGNOSIS — R059 Cough, unspecified: Secondary | ICD-10-CM | POA: Diagnosis not present

## 2021-12-08 HISTORY — DX: Urinary tract infection, site not specified: N39.0

## 2021-12-08 LAB — COMPREHENSIVE METABOLIC PANEL
ALT: 20 U/L (ref 0–44)
AST: 21 U/L (ref 15–41)
Albumin: 3.6 g/dL (ref 3.5–5.0)
Alkaline Phosphatase: 85 U/L (ref 38–126)
Anion gap: 8 (ref 5–15)
BUN: 28 mg/dL — ABNORMAL HIGH (ref 8–23)
CO2: 24 mmol/L (ref 22–32)
Calcium: 8.7 mg/dL — ABNORMAL LOW (ref 8.9–10.3)
Chloride: 104 mmol/L (ref 98–111)
Creatinine, Ser: 1.27 mg/dL — ABNORMAL HIGH (ref 0.61–1.24)
GFR, Estimated: 54 mL/min — ABNORMAL LOW (ref >60)
Glucose, Bld: 219 mg/dL — ABNORMAL HIGH (ref 70–99)
Potassium: 3.9 mmol/L (ref 3.5–5.1)
Sodium: 136 mmol/L (ref 135–145)
Total Bilirubin: 0.5 mg/dL (ref 0.3–1.2)
Total Protein: 7.4 g/dL (ref 6.5–8.1)

## 2021-12-08 LAB — URINALYSIS, ROUTINE W REFLEX MICROSCOPIC
Bacteria, UA: NONE SEEN
Bilirubin Urine: NEGATIVE
Glucose, UA: >=500 mg/dL — AB
Ketones, ur: NEGATIVE mg/dL
Leukocytes,Ua: NEGATIVE
Nitrite: NEGATIVE
Protein, ur: NEGATIVE mg/dL
Specific Gravity, Urine: 1.016 (ref 1.005–1.030)
pH: 6 (ref 5.0–8.0)

## 2021-12-08 LAB — CBC WITH DIFFERENTIAL/PLATELET
Abs Immature Granulocytes: 0.04 10*3/uL (ref 0.00–0.07)
Basophils Absolute: 0.1 10*3/uL (ref 0.0–0.1)
Basophils Relative: 1 %
Eosinophils Absolute: 0.6 10*3/uL — ABNORMAL HIGH (ref 0.0–0.5)
Eosinophils Relative: 7 %
HCT: 40.2 % (ref 39.0–52.0)
Hemoglobin: 13.3 g/dL (ref 13.0–17.0)
Immature Granulocytes: 1 %
Lymphocytes Relative: 15 %
Lymphs Abs: 1.2 10*3/uL (ref 0.7–4.0)
MCH: 33.2 pg (ref 26.0–34.0)
MCHC: 33.1 g/dL (ref 30.0–36.0)
MCV: 100.2 fL — ABNORMAL HIGH (ref 80.0–100.0)
Monocytes Absolute: 0.6 10*3/uL (ref 0.1–1.0)
Monocytes Relative: 8 %
Neutro Abs: 5.7 10*3/uL (ref 1.7–7.7)
Neutrophils Relative %: 68 %
Platelets: 174 10*3/uL (ref 150–400)
RBC: 4.01 MIL/uL — ABNORMAL LOW (ref 4.22–5.81)
RDW: 12.9 % (ref 11.5–15.5)
WBC: 8.2 10*3/uL (ref 4.0–10.5)
nRBC: 0 % (ref 0.0–0.2)

## 2021-12-08 LAB — CBG MONITORING, ED: Glucose-Capillary: 231 mg/dL — ABNORMAL HIGH (ref 70–99)

## 2021-12-08 LAB — GLUCOSE, CAPILLARY: Glucose-Capillary: 120 mg/dL — ABNORMAL HIGH (ref 70–99)

## 2021-12-08 MED ORDER — INSULIN ASPART 100 UNIT/ML IJ SOLN
0.0000 [IU] | Freq: Every day | INTRAMUSCULAR | Status: DC
  Administered 2021-12-10: 4 [IU] via SUBCUTANEOUS

## 2021-12-08 MED ORDER — ONDANSETRON HCL 4 MG PO TABS: 4.0000 mg | ORAL_TABLET | Freq: Four times a day (QID) | ORAL | Status: DC | PRN

## 2021-12-08 MED ORDER — ONDANSETRON HCL 4 MG/2ML IJ SOLN: 4.0000 mg | Freq: Four times a day (QID) | INTRAMUSCULAR | Status: DC | PRN

## 2021-12-08 MED ORDER — ACETAMINOPHEN 650 MG RE SUPP: 650.0000 mg | Freq: Four times a day (QID) | RECTAL | Status: DC | PRN

## 2021-12-08 MED ORDER — TRAZODONE HCL 50 MG PO TABS
50.0000 mg | ORAL_TABLET | Freq: Once | ORAL | Status: AC
  Administered 2021-12-08: 50 mg via ORAL
  Filled 2021-12-08: qty 1

## 2021-12-08 MED ORDER — INSULIN ASPART 100 UNIT/ML IJ SOLN
0.0000 [IU] | Freq: Three times a day (TID) | INTRAMUSCULAR | Status: DC
  Administered 2021-12-10: 11 [IU] via SUBCUTANEOUS
  Administered 2021-12-10: 15 [IU] via SUBCUTANEOUS
  Administered 2021-12-11: 11 [IU] via SUBCUTANEOUS
  Administered 2021-12-11: 3 [IU] via SUBCUTANEOUS

## 2021-12-08 MED ORDER — ENOXAPARIN SODIUM 40 MG/0.4ML IJ SOSY
40.0000 mg | PREFILLED_SYRINGE | INTRAMUSCULAR | Status: DC
  Administered 2021-12-08 – 2021-12-10 (×3): 40 mg via SUBCUTANEOUS
  Filled 2021-12-08 (×3): qty 0.4

## 2021-12-08 MED ORDER — MEMANTINE HCL 10 MG PO TABS
5.0000 mg | ORAL_TABLET | Freq: Two times a day (BID) | ORAL | Status: DC
  Administered 2021-12-08 – 2021-12-11 (×6): 5 mg via ORAL
  Filled 2021-12-08 (×6): qty 1

## 2021-12-08 MED ORDER — SODIUM CHLORIDE 0.9 % IV BOLUS
1000.0000 mL | Freq: Once | INTRAVENOUS | Status: AC
  Administered 2021-12-08: 1000 mL via INTRAVENOUS

## 2021-12-08 MED ORDER — LORAZEPAM 2 MG/ML IJ SOLN
0.5000 mg | Freq: Every evening | INTRAMUSCULAR | Status: AC | PRN
  Administered 2021-12-08: 0.5 mg via INTRAVENOUS
  Filled 2021-12-08: qty 1

## 2021-12-08 MED ORDER — ATENOLOL 25 MG PO TABS
12.5000 mg | ORAL_TABLET | Freq: Two times a day (BID) | ORAL | Status: DC
Start: 2021-12-08 — End: 2021-12-11
  Administered 2021-12-08 – 2021-12-11 (×5): 12.5 mg via ORAL
  Filled 2021-12-08 (×6): qty 1

## 2021-12-08 MED ORDER — ACETAMINOPHEN 325 MG PO TABS: 650.0000 mg | ORAL_TABLET | Freq: Four times a day (QID) | ORAL | Status: DC | PRN

## 2021-12-08 MED ORDER — INSULIN DEGLUDEC 100 UNIT/ML ~~LOC~~ SOPN
10.0000 [IU] | PEN_INJECTOR | Freq: Every day | SUBCUTANEOUS | Status: DC
  Filled 2021-12-08: qty 3

## 2021-12-08 MED ORDER — SODIUM CHLORIDE 0.9 % IV SOLN: INTRAVENOUS | Status: DC

## 2021-12-08 MED ORDER — POLYETHYLENE GLYCOL 3350 17 G PO PACK: 17.0000 g | PACK | Freq: Every day | ORAL | Status: DC | PRN

## 2021-12-08 MED ORDER — SODIUM CHLORIDE 0.9 % IV SOLN: INTRAVENOUS | Status: AC

## 2021-12-08 NOTE — ED Provider Notes (Addendum)
?[facility] ?Provider Note ? ? ?CSN: [id number] ?Arrival date & time: 12/08/21  1243 ? ?  ? ?History ? ?Chief Complaint  ?Patient presents with  ? Altered Mental Status  ? ? ?[name] is a 88 y.o. male. ? ?Patient brought in by son.  There is been a significant degree of confusion the last couple days patient staying up all night.  Seems to be hallucinating.  Patient last seen in the emergency department May 7 following a fall and had rib fractures on the right side eighth and ninth rib.  Had a small right pneumothorax.  Had a follow-up chest x-ray prior to discharge from the ED that showed no obvious pneumothorax.  The pneumothorax was originally identified on the CT chest.  Patient had head CT CT chest abdomen and pelvis.  Head CT showed no acute problems.  Patient has been on oxycodone for pain medicine since 8 May.  Family states he has been eating well.  But patient appears dehydrated.  Also some concerns for urinary tract infection.  Blood sugar here is 231.  Patient will follow commands here.  But not very verbal.  Son says he has been weak with walking as well.  Does have assistance at home.  Past medical history significant for diabetes hypertension hyperlipidemia dementia.  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 ? ? ?  ? ?Home Medications ?Prior to Admission medications   ?Medication Sig Start Date End Date Taking? Authorizing Provider  ?aspirin 81 MG EC tablet Take 1 tablet by mouth daily. 11/21/09   [provider]  ?atenolol (TENORMIN) 25 MG tablet TAKE 1 TABLET BY MOUTH IN THE MORNING AND 1/2 TABLET AT BEDTIME ?Patient taking differently: Take 12.5 mg by mouth 2 (two) times daily. 07/15/21   Susy Frizzle, MD  ?B-D UF III MINI PEN NEEDLES 31G X 5 MM MISC USE 4 TIMES PER DAY 12/31/20   Elayne Snare, MD  ?Blood Glucose Monitoring Suppl (ONE TOUCH ULTRA 2) w/Device KIT Use to test blood sugar daily ?Dx code E11.65 04/30/20   Susy Frizzle, MD  ?cephALEXin  (KEFLEX) 500 MG capsule Take 1 capsule (500 mg total) by mouth 3 (three) times daily. 10/31/21   Susy Frizzle, MD  ?cholecalciferol (VITAMIN D) 1000 UNITS tablet Take 1,000 Units by mouth daily.    [provider]  ?docusate sodium (COLACE) 100 MG capsule Take 100 mg by mouth 2 (two) times daily.    [provider]  ?donepezil (ARICEPT) 10 MG tablet TAKE 1 TABLET BY MOUTH EVERYDAY AT BEDTIME 07/15/21   Susy Frizzle, MD  ?empagliflozin (JARDIANCE) 25 MG TABS tablet Take 1 tablet (25 mg total) by mouth daily with breakfast. 07/15/21   Susy Frizzle, MD  ?ferrous gluconate (FERGON) 325 MG tablet Take 325 mg by mouth daily with breakfast.    [provider]  ?insulin lispro (HUMALOG) 100 UNIT/ML injection For sugars 150-200-2 units, 201-250-4 units, 251-300-6 units, 301-350-8 units, 351+ give 10 units ?Patient not taking: Reported on 10/31/2021 07/16/21   Susy Frizzle, MD  ?Lancet Devices (ONE TOUCH DELICA LANCING DEV) MISC Use to check blood sugar three times daily. 09/14/19   Elayne Snare, MD  ?memantine (NAMENDA) 10 MG tablet Take 0.5 tablets (5 mg total) by mouth 2 (two) times daily. 07/15/21   Susy Frizzle, MD  ?metFORMIN (GLUCOPHAGE) 500 MG tablet TAKE TWO TABLETS BY MOUTH EVERY MORNING AND TAKE ONE TABLET EVERY EVENING FOR 90  DAYS 07/15/21   Susy Frizzle, MD  ?mometasone (ELOCON) 0.1 % ointment Apply topically daily. 12/21/14   Susy Frizzle, MD  ?omeprazole (PRILOSEC) 20 MG capsule Take 1 capsule (20 mg total) by mouth daily. 07/15/21   Susy Frizzle, MD  ?Jonetta Speak Lancets 12W MISC 1 each by Does not apply route in the morning, at noon, and at bedtime. Use to check blood sugar three times daily. DX:E11.65 09/16/19   Elayne Snare, MD  ?Roma Schanz test strip USE AS INSTRUCTED TO CHECK BLOOD SUGAR THREE TIMES A DAY. DX CODE E11.65 09/11/20   Elayne Snare, MD  ?oxyCODONE (ROXICODONE) 5 MG immediate release tablet Take 1 tablet (5 mg total) by mouth  every 4 (four) hours as needed for severe pain. 12/03/07   Delora Fuel, MD  ?repaglinide (PRANDIN) 1 MG tablet Take 1 tablet (1 mg total) by mouth 3 (three) times daily before meals. 07/15/21   Susy Frizzle, MD  ?simvastatin (ZOCOR) 40 MG tablet TAKE ONE TABLET BY MOUTH EVERY NIGHT AT BEDTIME FOR HYPERLIPIDEMIA 07/15/21   Susy Frizzle, MD  ?TRESIBA FLEXTOUCH 100 UNIT/ML FlexTouch Pen INJECT 20 UNITS TOTAL INTO THE SKIN DAILY. ?Patient taking differently: 16 Units daily. 04/30/21   Elayne Snare, MD  ?   ? ?Allergies    ?Avodart [dutasteride], Ibuprofen, and Morphine   ? ?Review of Systems   ?Review of Systems  ?Constitutional:  Positive for activity change. Negative for chills and fever.  ?HENT:  Negative for ear pain and sore throat.   ?Eyes:  Negative for pain and visual disturbance.  ?Respiratory:  Negative for cough and shortness of breath.   ?Cardiovascular:  Negative for chest pain and palpitations.  ?Gastrointestinal:  Negative for abdominal pain and vomiting.  ?Genitourinary:  Negative for dysuria and hematuria.  ?Musculoskeletal:  Negative for arthralgias and back pain.  ?Skin:  Negative for color change and rash.  ?Neurological:  Negative for seizures and syncope.  ?Psychiatric/Behavioral:  Positive for confusion.   ?All other systems reviewed and are negative. ? ?Physical Exam ?Updated Vital Signs ?BP (!) 166/81   Pulse 72   Temp 98.1 ?F (36.7 ?C) (Axillary)   Resp (!) 22   SpO2 95%  ?Physical Exam ?Vitals and nursing note reviewed.  ?Constitutional:   ?   General: He is not in acute distress. ?   Appearance: Normal appearance. He is well-developed.  ?HENT:  ?   Head: Normocephalic and atraumatic.  ?   Mouth/Throat:  ?   Mouth: Mucous membranes are dry.  ?   Comments: Mucous membranes fairly dry. ?Eyes:  ?   Extraocular Movements: Extraocular movements intact.  ?   Conjunctiva/sclera: Conjunctivae normal.  ?   Pupils: Pupils are equal, round, and reactive to light.  ?Cardiovascular:  ?   Rate  and Rhythm: Normal rate and regular rhythm.  ?   Heart sounds: No murmur heard. ?Pulmonary:  ?   Effort: Pulmonary effort is normal. No respiratory distress.  ?   Breath sounds: Normal breath sounds.  ?Abdominal:  ?   Palpations: Abdomen is soft.  ?   Tenderness: There is no abdominal tenderness.  ?Musculoskeletal:     ?   General: No swelling.  ?   Cervical back: Normal range of motion and neck supple.  ?Skin: ?   General: Skin is warm and dry.  ?   Capillary Refill: Capillary refill takes less than 2 seconds.  ?Neurological:  ?   General: No focal  deficit present.  ?   Mental Status: He is alert.  ?   Motor: No weakness.  ?   Comments: Patient not verbalizing but will follow all commands.  No obvious lower extremity upper extremity weakness.  No facial droop no obvious cranial nerve deficits other than may be the speech.  ?Psychiatric:     ?   Mood and Affect: Mood normal.  ? ? ?ED Results / Procedures / Treatments   ?Labs ?(all labs ordered are listed, but only abnormal results are displayed) ?Labs Reviewed  ?CBG MONITORING, ED - Abnormal; Notable for the following components:  ?    Result Value  ? Glucose-Capillary 231 (*)   ? All other components within normal limits  ?COMPREHENSIVE METABOLIC PANEL  ?CBC WITH DIFFERENTIAL/PLATELET  ?URINALYSIS, ROUTINE W REFLEX MICROSCOPIC  ? ? ?EKG ?EKG Interpretation ? ?Date/Time:  Sunday Dec 08 2021 13:02:52 EDT ?Ventricular Rate:  73 ?PR Interval:  190 ?QRS Duration: 87 ?QT Interval:  400 ?QTC Calculation: 441 ?R Axis:   -1 ?Text Interpretation: Sinus rhythm Low voltage, precordial leads Baseline wander in lead(s) II III aVF V2 Confirmed by Fredia Sorrow [phone]) on 12/08/2021 1:28:16 PM ? ?Radiology ?No results found. ? ?Procedures ?Procedures  ? ? ?Medications Ordered in ED ?Medications  ?0.9 %  sodium chloride infusion (has no administration in time range)  ?sodium chloride 0.9 % bolus 1,000 mL (1,000 mLs Intravenous New Bag/Given 12/08/21 1406)  ? ? ?ED Course/ Medical  Decision Making/ A&P ?  ?                        ?Medical Decision Making ?Amount and/or Complexity of Data Reviewed ?Labs: ordered. ?Radiology: ordered. ? ?Risk ?Prescription drug management. ? ? ?Patient with al

## 2021-12-08 NOTE — ED Notes (Signed)
Patient is restless in bed attempting to get out of bed. Son at bedside and this nurse redirected patient to lay back in bed. Patient and family educated on need to stay in bed and fall prevention. Patient is confused stating that he has to leave and pointing at the walls. Patients son states that patient has not slept in 24 hours and requesting medication to help him relax. EDP made aware.  ?

## 2021-12-08 NOTE — ED Provider Notes (Signed)
?  Physical Exam  ?BP (!) 172/84   Pulse 70   Temp 98.1 ?F (36.7 ?C) (Axillary)   Resp 19   SpO2 95%  ? ?Physical Exam ? ?Procedures  ?Procedures ? ?ED Course / MDM  ?  ?Medical Decision Making ?Amount and/or Complexity of Data Reviewed ?Labs: ordered. ?Radiology: ordered. ? ?Risk ?Prescription drug management. ? ? ?Received patient in signout.  Mental status changes.  Did have a fall about a week ago with some rib fractures.  Has been on pain medicines.  However been more confused since then.  Work-up reassuring.  Mild hyperglycemia.  Urine does not show infection.  Head CT stable.  X-ray does not show pneumothorax but does show continued rib fractures.  Discussed with the patient and the son.  Does have some baseline dementia but does live alone.  Patient has been hallucinating is not a condition to be able to live alone at this time.  Has not slept in 24 hours.  Keeps attempting to get out of bed here.  Potentially could be medicine related or potentially dehydration.  With acute mental status change I feel if he would benefit from from admission to the hospital.  Will discuss with hospitalist ? ? ? ? ?  ?Davonna Belling, MD ?12/08/21 1625 ? ?

## 2021-12-08 NOTE — H&P (Signed)
?History and Physical  ? ? ?[name] ZOX:[id number] DOB: [birth year] DOA: 12/08/2021 ? ?PCP: Susy Frizzle, MD  ? ?Patient coming from: Home ? ?I have personally briefly reviewed patient's old medical records in [place] ? ?Chief Complaint: AMS ? ?HPI: [name] is a 88 y.o. male with medical history significant for HTN, DM, BPH s/p TURP.  ?Patient was brought to the ED from home with reports of altered mental status. ? ?Patient to the ED 5/7 with reports of a fall- patient had wandered out of the house and fallen on his left side, was found by his son.  He has since had 24-hour supervision.  CT chest abdomen and pelvis was obtained which showed-acute posterolateral eighth and ninth rib fractures, with small anterior right basilar pneumothorax.  Head CT was unremarkable.  Subsequent follow-up chest x-ray showed no pneumothorax on plain x-ray.  He was discharged on oxycodone which he was taking and was doing well until 2 days ago. ?Over the past 2 days, patient has been getting increasingly confused, hallucinating, grabbing at things or objects in front of him, that are not actually.  Patient's son reports that patient's last took a nap at about 10 yesterday otherwise he has been awake since then did not sleep all night. His appetite has been good, and his family has tried to keep him hydrated.  No vomiting no loose stools.  Patient has been asking to use the bathroom to urinate quite frequently, and son was quite convinced that patient had a UTI.  Patient's son reports that patient historically is quite sensitive to opioid medications. ? ?On my evaluation, patient is able to answer simple questions.  At baseline patient's son reports that patient is able to hold a conversation, recognize family, ambulates most times with him walker. ? ?ED Course: Blood pressure 98.1.  Heart rate 70s.  Respiratory rate 14-22.  Blood pressure systolic 811B to 147W.  O2 sats greater than 93% on room air. ?UA-  not suggestive of UTI.  Chest x-ray shows multiple right-sided rib fractures without pneumothorax.   ?CT without acute abnormality, shows stable cerebral atrophy ventriculomegaly and white matter disease. ?1 L bolus given in ED.  Hospitalist to admit. ? ?Review of Systems: As per HPI all other systems reviewed and negative. ? ?Past Medical History:  ?Diagnosis Date  ? B12 deficiency   ? BPH (benign prostatic hypertrophy)   ? Colon polyps   ? Dementia ([place])   ? per family member  ? Diabetes mellitus   ? GERD (gastroesophageal reflux disease)   ? hiatal hernia  ? History of kidney stones   ? Hyperlipidemia   ? Hypertension   ? Osteoarthritis   ? UTI (urinary tract infection)   ? ? ?Past Surgical History:  ?Procedure Laterality Date  ? APPENDECTOMY    ? CARDIOVASCULAR STRESS TEST  09/02/2011  ? Post stress myocardial perfusion images show normal pattern of perfusion in all areas. No significant wall abnormalites noted.  ? CAROTID DOPPLER  01/27/2011  ? Right & Left ICAs 0-49% diameter reduction, right & left subclavian arteries demonstrated less than 50% diameter reduction.  ? CYSTOSCOPY WITH BIOPSY  06/17/2012  ? Procedure: CYSTOSCOPY WITH BIOPSY;  Surgeon: Molli Hazard, MD;  Location: [facility];  Service: Urology;  Laterality: N/A;  ? KNEE CARTILAGE SURGERY    ? Arthroscope  ? Partial amputaion left foot    ? TRANSTHORACIC ECHOCARDIOGRAM  09/02/2011  ? EF >55%, normal LV systolic function,  mild diastolic dysfunction  ? TRANSURETHRAL RESECTION OF PROSTATE  06/17/2012  ? Procedure: TRANSURETHRAL RESECTION OF THE PROSTATE WITH GYRUS INSTRUMENTS;  Surgeon: Molli Hazard, MD;  Location: [facility];  Service: Urology;  Laterality: N/A;  ? ? ? reports that he has quit smoking. He has never used smokeless tobacco. He reports that he does not drink alcohol and does not use drugs. ? ?Allergies  ?Allergen Reactions  ? Avodart [Dutasteride] Swelling  ? Ibuprofen Nausea And Vomiting  ? Morphine Nausea And Vomiting   ? ? ?Family History  ?Problem Relation Age of Onset  ? Heart attack Mother   ? Hypertension Mother   ? ? ?Prior to Admission medications   ?Medication Sig Start Date End Date Taking? Authorizing Provider  ?aspirin 81 MG EC tablet Take 1 tablet by mouth daily. 11/21/09   [provider]  ?atenolol (TENORMIN) 25 MG tablet TAKE 1 TABLET BY MOUTH IN THE MORNING AND 1/2 TABLET AT BEDTIME ?Patient taking differently: Take 12.5 mg by mouth 2 (two) times daily. 07/15/21   Susy Frizzle, MD  ?B-D UF III MINI PEN NEEDLES 31G X 5 MM MISC USE 4 TIMES PER DAY 12/31/20   Elayne Snare, MD  ?Blood Glucose Monitoring Suppl (ONE TOUCH ULTRA 2) w/Device KIT Use to test blood sugar daily ?Dx code E11.65 04/30/20   Susy Frizzle, MD  ?cephALEXin (KEFLEX) 500 MG capsule Take 1 capsule (500 mg total) by mouth 3 (three) times daily. 10/31/21   Susy Frizzle, MD  ?cholecalciferol (VITAMIN D) 1000 UNITS tablet Take 1,000 Units by mouth daily.    [provider]  ?docusate sodium (COLACE) 100 MG capsule Take 100 mg by mouth 2 (two) times daily.    [provider]  ?donepezil (ARICEPT) 10 MG tablet TAKE 1 TABLET BY MOUTH EVERYDAY AT BEDTIME 07/15/21   Susy Frizzle, MD  ?empagliflozin (JARDIANCE) 25 MG TABS tablet Take 1 tablet (25 mg total) by mouth daily with breakfast. 07/15/21   Susy Frizzle, MD  ?ferrous gluconate (FERGON) 325 MG tablet Take 325 mg by mouth daily with breakfast.    [provider]  ?insulin lispro (HUMALOG) 100 UNIT/ML injection For sugars 150-200-2 units, 201-250-4 units, 251-300-6 units, 301-350-8 units, 351+ give 10 units ?Patient not taking: Reported on 10/31/2021 07/16/21   Susy Frizzle, MD  ?Lancet Devices (ONE TOUCH DELICA LANCING DEV) MISC Use to check blood sugar three times daily. 09/14/19   Elayne Snare, MD  ?memantine (NAMENDA) 10 MG tablet Take 0.5 tablets (5 mg total) by mouth 2 (two) times daily. 07/15/21   Susy Frizzle, MD  ?metFORMIN (GLUCOPHAGE)  500 MG tablet TAKE TWO TABLETS BY MOUTH EVERY MORNING AND TAKE ONE TABLET EVERY EVENING FOR 90 DAYS 07/15/21   Susy Frizzle, MD  ?mometasone (ELOCON) 0.1 % ointment Apply topically daily. 12/21/14   Susy Frizzle, MD  ?omeprazole (PRILOSEC) 20 MG capsule Take 1 capsule (20 mg total) by mouth daily. 07/15/21   Susy Frizzle, MD  ?Jonetta Speak Lancets 47W MISC 1 each by Does not apply route in the morning, at noon, and at bedtime. Use to check blood sugar three times daily. DX:E11.65 09/16/19   Elayne Snare, MD  ?Roma Schanz test strip USE AS INSTRUCTED TO CHECK BLOOD SUGAR THREE TIMES A DAY. DX CODE E11.65 09/11/20   Elayne Snare, MD  ?oxyCODONE (ROXICODONE) 5 MG immediate release tablet Take 1 tablet (5 mg total) by mouth every 4 (four) hours  as needed for severe pain. 04/04/41   Delora Fuel, MD  ?repaglinide (PRANDIN) 1 MG tablet Take 1 tablet (1 mg total) by mouth 3 (three) times daily before meals. 07/15/21   Susy Frizzle, MD  ?simvastatin (ZOCOR) 40 MG tablet TAKE ONE TABLET BY MOUTH EVERY NIGHT AT BEDTIME FOR HYPERLIPIDEMIA 07/15/21   Susy Frizzle, MD  ?TRESIBA FLEXTOUCH 100 UNIT/ML FlexTouch Pen INJECT 20 UNITS TOTAL INTO THE SKIN DAILY. ?Patient taking differently: 16 Units daily. 04/30/21   Elayne Snare, MD  ? ? ?Physical Exam: ?Vitals:  ? 12/08/21 1430 12/08/21 1500 12/08/21 1600 12/08/21 1630  ?BP: (!) 188/87 (!) 154/75 (!) 172/84 (!) 178/78  ?Pulse: 73 70  67  ?Resp: _0 ?Temp:      ?TempSrc:      ?SpO2: 94% 95% 95% 96%  ? ? ?Constitutional: Intermittently pulling at BP cuff, does not appear to be in distress ?Vitals:  ? 12/08/21 1430 12/08/21 1500 12/08/21 1600 12/08/21 1630  ?BP: (!) 188/87 (!) 154/75 (!) 172/84 (!) 178/78  ?Pulse: 73 70  67  ?Resp: _1 ?Temp:      ?TempSrc:      ?SpO2: 94% 95% 95% 96%  ? ?Eyes: PERRL, lids and conjunctivae normal ?ENMT: Mucous membranes are very dry.  ?Neck: normal, supple, no masses, no thyromegaly ?Respiratory: clear to  auscultation bilaterally, no wheezing, no crackles. Normal respiratory effort. No accessory muscle use.  ?Cardiovascular: Regular rate and rhythm, no murmurs / rubs / gallops. No extremity edema.  Lower extremities warm.

## 2021-12-08 NOTE — ED Triage Notes (Signed)
Son brings pt in today due to increase altered mental status.  Pt with dementia. Generalized weakness.  Reported that pt has been up and awake for over 20 hours. Recent fall.  ?

## 2021-12-08 NOTE — Assessment & Plan Note (Signed)
Blood pressure 150s to 180s. ?-Resume atenolol ?

## 2021-12-08 NOTE — Assessment & Plan Note (Signed)
A1c 8.7. ?- SSI- M ?-resume Tresiba at reduced dose 10 units daily ?-Hold Jardiance and metformin ?

## 2021-12-08 NOTE — ED Notes (Signed)
Patient transported to CT 

## 2021-12-08 NOTE — ED Notes (Addendum)
Safety mittens placed on patient at this time. Patient attempting to remove lines and cardiac monitoring.  ?

## 2021-12-08 NOTE — Assessment & Plan Note (Addendum)
With baseline dementia.  Presenting with confusion, hallucinating.  Head CT without acute abnormalities.  Despite reports of urinary frequency, UA is not suggestive of infection.  Chest x-ray clear.  Started on oxycodone about a week ago for rib fractures, his mucous membranes are markedly dry, creatinine and vitals are stable.  Etiology may be secondary to opioids and dehydration. ?-1 L bolus given, continue N/s 75 cc/hr x 1 day ?-Hold opioids for now ?-Son requesting medication to help patient's sleep, will start with trazodone 50 mg daily ?-Resume memantine ?

## 2021-12-09 DIAGNOSIS — T402X5A Adverse effect of other opioids, initial encounter: Secondary | ICD-10-CM | POA: Diagnosis present

## 2021-12-09 DIAGNOSIS — Z794 Long term (current) use of insulin: Secondary | ICD-10-CM | POA: Diagnosis not present

## 2021-12-09 DIAGNOSIS — E860 Dehydration: Secondary | ICD-10-CM | POA: Diagnosis present

## 2021-12-09 DIAGNOSIS — I10 Essential (primary) hypertension: Secondary | ICD-10-CM | POA: Diagnosis present

## 2021-12-09 DIAGNOSIS — E785 Hyperlipidemia, unspecified: Secondary | ICD-10-CM | POA: Diagnosis present

## 2021-12-09 DIAGNOSIS — Z8673 Personal history of transient ischemic attack (TIA), and cerebral infarction without residual deficits: Secondary | ICD-10-CM | POA: Diagnosis not present

## 2021-12-09 DIAGNOSIS — Z8719 Personal history of other diseases of the digestive system: Secondary | ICD-10-CM | POA: Diagnosis not present

## 2021-12-09 DIAGNOSIS — Z66 Do not resuscitate: Secondary | ICD-10-CM | POA: Diagnosis present

## 2021-12-09 DIAGNOSIS — F0390 Unspecified dementia without behavioral disturbance: Secondary | ICD-10-CM | POA: Diagnosis not present

## 2021-12-09 DIAGNOSIS — E119 Type 2 diabetes mellitus without complications: Secondary | ICD-10-CM | POA: Diagnosis not present

## 2021-12-09 DIAGNOSIS — F05 Delirium due to known physiological condition: Secondary | ICD-10-CM | POA: Diagnosis present

## 2021-12-09 DIAGNOSIS — W19XXXA Unspecified fall, initial encounter: Secondary | ICD-10-CM | POA: Diagnosis present

## 2021-12-09 DIAGNOSIS — E1165 Type 2 diabetes mellitus with hyperglycemia: Secondary | ICD-10-CM | POA: Diagnosis present

## 2021-12-09 DIAGNOSIS — R1310 Dysphagia, unspecified: Secondary | ICD-10-CM | POA: Diagnosis present

## 2021-12-09 DIAGNOSIS — Z96641 Presence of right artificial hip joint: Secondary | ICD-10-CM | POA: Diagnosis present

## 2021-12-09 DIAGNOSIS — Z7401 Bed confinement status: Secondary | ICD-10-CM | POA: Diagnosis not present

## 2021-12-09 DIAGNOSIS — Z7984 Long term (current) use of oral hypoglycemic drugs: Secondary | ICD-10-CM | POA: Diagnosis not present

## 2021-12-09 DIAGNOSIS — K219 Gastro-esophageal reflux disease without esophagitis: Secondary | ICD-10-CM | POA: Diagnosis present

## 2021-12-09 DIAGNOSIS — R4182 Altered mental status, unspecified: Secondary | ICD-10-CM | POA: Diagnosis not present

## 2021-12-09 DIAGNOSIS — Z9079 Acquired absence of other genital organ(s): Secondary | ICD-10-CM | POA: Diagnosis not present

## 2021-12-09 DIAGNOSIS — S2241XD Multiple fractures of ribs, right side, subsequent encounter for fracture with routine healing: Secondary | ICD-10-CM | POA: Diagnosis not present

## 2021-12-09 DIAGNOSIS — E1169 Type 2 diabetes mellitus with other specified complication: Secondary | ICD-10-CM | POA: Diagnosis not present

## 2021-12-09 DIAGNOSIS — G9340 Encephalopathy, unspecified: Secondary | ICD-10-CM | POA: Diagnosis present

## 2021-12-09 DIAGNOSIS — R404 Transient alteration of awareness: Secondary | ICD-10-CM | POA: Diagnosis not present

## 2021-12-09 DIAGNOSIS — Z7982 Long term (current) use of aspirin: Secondary | ICD-10-CM | POA: Diagnosis not present

## 2021-12-09 DIAGNOSIS — G9341 Metabolic encephalopathy: Secondary | ICD-10-CM | POA: Diagnosis present

## 2021-12-09 DIAGNOSIS — Z888 Allergy status to other drugs, medicaments and biological substances status: Secondary | ICD-10-CM | POA: Diagnosis not present

## 2021-12-09 DIAGNOSIS — Z8249 Family history of ischemic heart disease and other diseases of the circulatory system: Secondary | ICD-10-CM | POA: Diagnosis not present

## 2021-12-09 DIAGNOSIS — Z79899 Other long term (current) drug therapy: Secondary | ICD-10-CM | POA: Diagnosis not present

## 2021-12-09 DIAGNOSIS — F03C0 Unspecified dementia, severe, without behavioral disturbance, psychotic disturbance, mood disturbance, and anxiety: Secondary | ICD-10-CM | POA: Diagnosis present

## 2021-12-09 DIAGNOSIS — R0902 Hypoxemia: Secondary | ICD-10-CM | POA: Diagnosis not present

## 2021-12-09 DIAGNOSIS — Z885 Allergy status to narcotic agent status: Secondary | ICD-10-CM | POA: Diagnosis not present

## 2021-12-09 DIAGNOSIS — S2241XA Multiple fractures of ribs, right side, initial encounter for closed fracture: Secondary | ICD-10-CM | POA: Diagnosis present

## 2021-12-09 DIAGNOSIS — G928 Other toxic encephalopathy: Secondary | ICD-10-CM | POA: Diagnosis present

## 2021-12-09 DIAGNOSIS — R059 Cough, unspecified: Secondary | ICD-10-CM | POA: Diagnosis not present

## 2021-12-09 DIAGNOSIS — N400 Enlarged prostate without lower urinary tract symptoms: Secondary | ICD-10-CM | POA: Diagnosis present

## 2021-12-09 DIAGNOSIS — Z886 Allergy status to analgesic agent status: Secondary | ICD-10-CM | POA: Diagnosis not present

## 2021-12-09 LAB — CBC
HCT: 37.2 % — ABNORMAL LOW (ref 39.0–52.0)
Hemoglobin: 12.8 g/dL — ABNORMAL LOW (ref 13.0–17.0)
MCH: 33.3 pg (ref 26.0–34.0)
MCHC: 34.4 g/dL (ref 30.0–36.0)
MCV: 96.9 fL (ref 80.0–100.0)
Platelets: 180 10*3/uL (ref 150–400)
RBC: 3.84 MIL/uL — ABNORMAL LOW (ref 4.22–5.81)
RDW: 12.7 % (ref 11.5–15.5)
WBC: 8.1 10*3/uL (ref 4.0–10.5)
nRBC: 0 % (ref 0.0–0.2)

## 2021-12-09 LAB — GLUCOSE, CAPILLARY
Glucose-Capillary: 89 mg/dL (ref 70–99)
Glucose-Capillary: 113 mg/dL — ABNORMAL HIGH (ref 70–99)
Glucose-Capillary: 88 mg/dL (ref 70–99)
Glucose-Capillary: 95 mg/dL (ref 70–99)

## 2021-12-09 LAB — BASIC METABOLIC PANEL
Anion gap: 7 (ref 5–15)
BUN: 18 mg/dL (ref 8–23)
CO2: 24 mmol/L (ref 22–32)
Calcium: 8.5 mg/dL — ABNORMAL LOW (ref 8.9–10.3)
Chloride: 107 mmol/L (ref 98–111)
Creatinine, Ser: 1.05 mg/dL (ref 0.61–1.24)
GFR, Estimated: >60 mL/min (ref >60)
Glucose, Bld: 70 mg/dL (ref 70–99)
Potassium: 3.7 mmol/L (ref 3.5–5.1)
Sodium: 138 mmol/L (ref 135–145)

## 2021-12-09 MED ORDER — DONEPEZIL HCL 5 MG PO TABS
10.0000 mg | ORAL_TABLET | Freq: Every day | ORAL | Status: DC
  Administered 2021-12-10: 10 mg via ORAL
  Filled 2021-12-09: qty 2

## 2021-12-09 MED ORDER — HALOPERIDOL LACTATE 5 MG/ML IJ SOLN
5.0000 mg | Freq: Once | INTRAMUSCULAR | Status: AC
  Administered 2021-12-09: 5 mg via INTRAVENOUS
  Filled 2021-12-09: qty 1

## 2021-12-09 MED ORDER — QUETIAPINE FUMARATE 25 MG PO TABS
25.0000 mg | ORAL_TABLET | Freq: Every evening | ORAL | Status: DC | PRN
Start: 2021-12-09 — End: 2021-12-10
  Administered 2021-12-10: 25 mg via ORAL
  Filled 2021-12-09: qty 1

## 2021-12-09 MED ORDER — QUETIAPINE FUMARATE 25 MG PO TABS
25.0000 mg | ORAL_TABLET | ORAL | Status: DC
  Filled 2021-12-09: qty 1

## 2021-12-09 MED ORDER — INSULIN GLARGINE-YFGN 100 UNIT/ML ~~LOC~~ SOLN
10.0000 [IU] | Freq: Every day | SUBCUTANEOUS | Status: DC
  Filled 2021-12-09 (×2): qty 0.1

## 2021-12-09 MED ORDER — ZIPRASIDONE MESYLATE 20 MG IM SOLR
20.0000 mg | Freq: Once | INTRAMUSCULAR | Status: AC
  Administered 2021-12-09: 20 mg via INTRAMUSCULAR
  Filled 2021-12-09: qty 20

## 2021-12-09 NOTE — Progress Notes (Signed)
Overnight, patient given ordered meds for sleep at bedtime. However, patient was awake most of the night, reaching into the air and talking to people that weren't there. He continuously picked at the mittens worn to keep him from pulling at lines.He did not try to get out of bed. Alert to self only. Close to change of shift, he conversed with this nurse saying he had 4 brothers, 3 sisters, and 4 children. No c/o of pain or discomfort. ?

## 2021-12-09 NOTE — TOC Progression Note (Signed)
Transition of Care (TOC) - Progression Note  ? ? ?Patient Details  ?Name: Marke Goodwyn [place] ?MRN: [id number] ?Date of Birth: 02-17-1929 ? ?Transition of Care (TOC) CM/SW Contact  ?Salome Arnt, LCSW ?Phone Number: ?12/09/2021, 2:45 PM ? ?Clinical Narrative:    ?Transition of Care (TOC) Screening Note ? ? ?Patient Details  ?Name: Kwamane Whack [place] ?Date of Birth: 1928/11/29 ? ? ?Transition of Care (TOC) CM/SW Contact:    ?Salome Arnt, LCSW ?Phone Number: ?12/09/2021, 2:46 PM ? ? ?Transition of Care Department [facility]) has reviewed patient and no TOC needs have been identified at this time. We will continue to monitor patient advancement through interdisciplinary progression rounds. If new patient transition needs arise, please place a TOC consult. ? ?PT consult pending. TOC will follow for recommendations.  ?  ? ? ? ?  ?  ? ?Expected Discharge Plan and Services ?  ?  ?  ?  ?  ?                ?  ?  ?  ?  ?  ?  ?  ?  ?  ?  ? ? ?Social Determinants of Health (SDOH) Interventions ?  ? ?Readmission Risk Interventions ?   ? View : No data to display.  ?  ?  ?  ? ? ?

## 2021-12-09 NOTE — Progress Notes (Signed)
?PROGRESS NOTE ? ? ? ?[name]  GNO:[id number] DOB: 1929/05/02 DOA: 12/08/2021 ?PCP: Susy Frizzle, MD  ? ? ?Brief Narrative:  ?88 year old male with history of hypertension, diabetes, admitted to the hospital with altered mental status.  Recently had a fall suffering right-sided rib fractures.  Was started on oxycodone for pain management.  Became increasingly confused, dehydrated.  No obvious signs of infection on work-up.  Admitted for persistent encephalopathy ? ? ?Assessment & Plan: ?  ?Principal Problem: ?  Acute metabolic encephalopathy ?Active Problems: ?  Diabetes mellitus ([place]) ?  Hypertension ?  Dementia without behavioral disturbance ([place]) ?  Acute encephalopathy ? ? ?Acute metabolic encephalopathy/acute delirium ?-Does not appear to have any obvious infection, chest x-ray clear urinalysis is not indicative of infection ?-He did have some dehydration on arrival, but overall labs are better with IV fluids ?-He has been taking oxycodone prior to admission which has since been held ?-Son also said that he has been awake for prolonged period of time which is likely also contributing to delirium ?-Received a dose of Ativan with without intended effect ?-Received a dose of Haldol this morning without any improvement of mental status ?-Due to his persistent agitation, repeatedly trying to get out of bed and risk of injury, will give a trial of Geodon ?-Can consider using Seroquel at night if he becomes agitated ? ?Dementia ?-Currently mental status is not at baseline ?-Continue on donepezil and memantine ? ?Diabetes ?-Chronically on metformin, Prandin, Tresiba and NovoLog ?-P.o. intake is currently poor ?-Monitor on sliding scale ? ?Hypertension ?-Continue on home dose of atenolol ? ?Recent rib fractures ?-Treat supportively ?-Cautious use of pain medication with current delirium ? ? ?DVT prophylaxis: enoxaparin (LOVENOX) injection 40 mg Start: 12/08/21 2030 ? ?Code Status: DNR ?Family Communication:  Discussed with son at the bedside ?Disposition Plan: Status is: Inpatient ?Remains inpatient appropriate because: Continued mental status changes, not back to baseline ? ? ? ? ?Consultants:  ? ? ?Procedures:  ? ? ?Antimicrobials:  ?  ? ? ?Subjective: ?Patient unable to provide any history.  Extremely confused and agitated ? ?Objective: ?Vitals:  ? 12/09/21 1016 12/09/21 1024 12/09/21 1030 12/09/21 1340  ?BP:  (!) 147/64 (!) 147/64 (!) 94/41  ?Pulse:  86 86 61  ?Resp: (!) 25   20  ?Temp:  98.9 ?F (37.2 ?C)  98.5 ?F (36.9 ?C)  ?TempSrc:  Axillary  Oral  ?SpO2:  95%  90%  ?Weight:      ?Height:      ? ? ?Intake/Output Summary (Last 24 hours) at 12/09/2021 2039 ?Last data filed at 12/09/2021 1500 ?Gross per 24 hour  ?Intake 410 ml  ?Output 1900 ml  ?Net -1490 ml  ? ?Filed Weights  ? 12/08/21 1850  ?Weight: 86.2 kg  ? ? ?Examination: ? ?General exam: Laying in bed, very agitated ?Respiratory system: Clear to auscultation. Respiratory effort normal. ?Cardiovascular system: S1 & S2 heard, RRR. No JVD, murmurs, rubs, gallops or clicks. No pedal edema. ?Gastrointestinal system: Abdomen is nondistended, soft and nontender. No organomegaly or masses felt. Normal bowel sounds heard. ?Central nervous system: No focal deficits, he is confused, increased tone throughout ?Extremities: Symmetric 5 x 5 power. ?Skin: No rashes, lesions or ulcers ?Psychiatry: Confused, unable to carry on a conversation ? ? ? ?Data Reviewed: I have personally reviewed following labs and imaging studies ? ?CBC: ?Recent Labs  ?Lab 12/08/21 ?1300 12/09/21 ?0445  ?WBC 8.2 8.1  ?NEUTROABS 5.7  --   ?HGB 13.3  12.8*  ?HCT 40.2 37.2*  ?MCV 100.2* 96.9  ?PLT 174 180  ? ?Basic Metabolic Panel: ?Recent Labs  ?Lab 12/08/21 ?1300 12/09/21 ?0445  ?NA 136 138  ?K 3.9 3.7  ?CL 104 107  ?CO2 24 24  ?GLUCOSE 219* 70  ?BUN 28* 18  ?CREATININE 1.27* 1.05  ?CALCIUM 8.7* 8.5*  ? ?GFR: ?Estimated Creatinine Clearance: 48.3 mL/min (by C-G formula based on SCr of 1.05  mg/dL). ?Liver Function Tests: ?Recent Labs  ?Lab 12/08/21 ?1300  ?AST 21  ?ALT 20  ?ALKPHOS 85  ?BILITOT 0.5  ?PROT 7.4  ?ALBUMIN 3.6  ? ?No results for input(s): LIPASE, AMYLASE in the last 168 hours. ?No results for input(s): AMMONIA in the last 168 hours. ?Coagulation Profile: ?No results for input(s): INR, PROTIME in the last 168 hours. ?Cardiac Enzymes: ?No results for input(s): CKTOTAL, CKMB, CKMBINDEX, TROPONINI in the last 168 hours. ?BNP (last 3 results) ?No results for input(s): PROBNP in the last 8760 hours. ?HbA1C: ?No results for input(s): HGBA1C in the last 72 hours. ?CBG: ?Recent Labs  ?Lab 12/08/21 ?1254 12/08/21 ?2222 12/09/21 ?0742 12/09/21 ?1149 12/09/21 ?1715  ?GLUCAP 231* 120* 89 113* 88  ? ?Lipid Profile: ?No results for input(s): CHOL, HDL, LDLCALC, TRIG, CHOLHDL, LDLDIRECT in the last 72 hours. ?Thyroid Function Tests: ?No results for input(s): TSH, T4TOTAL, FREET4, T3FREE, THYROIDAB in the last 72 hours. ?Anemia Panel: ?No results for input(s): VITAMINB12, FOLATE, FERRITIN, TIBC, IRON, RETICCTPCT in the last 72 hours. ?Sepsis Labs: ?No results for input(s): PROCALCITON, LATICACIDVEN in the last 168 hours. ? ?No results found for this or any previous visit (from the past 240 hour(s)).  ? ? ? ? ? ?Radiology Studies: ?CT Head Wo Contrast ? ?Result Date: 12/08/2021 ?CLINICAL DATA:  Mental status changes. EXAM: CT HEAD WITHOUT CONTRAST TECHNIQUE: Contiguous axial images were obtained from the base of the skull through the vertex without intravenous contrast. RADIATION DOSE REDUCTION: This exam was performed according to the departmental dose-optimization program which includes automated exposure control, adjustment of the mA and/or kV according to patient size and/or use of iterative reconstruction technique. COMPARISON:  12/01/2021 FINDINGS: Brain: Stable age related cerebral atrophy, ventriculomegaly and periventricular white matter disease. No extra-axial fluid collections are identified. No  CT findings for acute hemispheric infarction or intracranial hemorrhage. No mass lesions. The brainstem and cerebellum are normal. Vascular: Stable vascular calcifications. No aneurysm or hyperdense vessels. Skull: No skull fracture or bone lesions. Sinuses/Orbits: The paranasal sinuses and mastoid air cells are clear. The globes are intact. Other: No scalp lesions or scalp hematoma. IMPRESSION: 1. Stable age related cerebral atrophy, ventriculomegaly and periventricular white matter disease. 2. No acute intracranial findings or mass lesions. Electronically Signed   By: Marijo Sanes M.D.   On: 12/08/2021 14:54  ? ?DG Chest Port 1 View ? ?Result Date: 12/08/2021 ?CLINICAL DATA:  Status post fall May 7 with right-sided rib fractures and now with altered mental status EXAM: PORTABLE CHEST 1 VIEW COMPARISON:  CT dated Dec 01, 2021; chest radiograph dated Dec 02, 2021 FINDINGS: The cardiomediastinal silhouette is unchanged in contour. No pleural effusion. No pneumothorax. No acute pleuroparenchymal abnormality. Visualized abdomen is unremarkable. Revisualization of RIGHT-sided rib fractures. IMPRESSION: No discrete pneumothorax.  Multiple RIGHT-sided rib fractures. Electronically Signed   By: Valentino Saxon M.D.   On: 12/08/2021 14:36   ? ? ? ? ? ?Scheduled Meds: ? atenolol  12.5 mg Oral BID  ? enoxaparin (LOVENOX) injection  40 mg Subcutaneous Q24H  ? insulin  aspart  0-15 Units Subcutaneous TID WC  ? insulin aspart  0-5 Units Subcutaneous QHS  ? memantine  5 mg Oral BID  ? ?Continuous Infusions: ? ? LOS: 0 days  ? ? ?Time spent: 66mns ? ? ? ?JKathie Dike MD ?[facility] ? ? ?If 7PM-7AM, please contact night-coverage ?[email or web address] ? ?12/09/2021, 8:39 PM  ? ?

## 2021-12-09 NOTE — Progress Notes (Signed)
PT Cancellation Note ? ?Patient Details ?Name: [name] ?MRN: [id number] ?DOB: Aug 20, 1928 ? ? ?Cancelled Treatment:    Reason Eval/Treat Not Completed: Patient not medically ready. Per nurse, patient will altered mental status currently making mobility unsafe. Per son, [name], patient has not slept in almost 48 hours and continues to hallucinate he is falling while physically lying in bed. Will check back when able. ? ?[name] Raveling. Hartnett-Rands, MS, PT ?Per [place] ?[facility] #[id number] ? ?[name] Bonafede  [name] ?12/09/2021, 11:15 AM ?

## 2021-12-10 ENCOUNTER — Inpatient Hospital Stay (HOSPITAL_COMMUNITY): Payer: Medicare Other

## 2021-12-10 DIAGNOSIS — R1310 Dysphagia, unspecified: Secondary | ICD-10-CM

## 2021-12-10 DIAGNOSIS — E1169 Type 2 diabetes mellitus with other specified complication: Secondary | ICD-10-CM | POA: Diagnosis not present

## 2021-12-10 DIAGNOSIS — G9341 Metabolic encephalopathy: Secondary | ICD-10-CM | POA: Diagnosis not present

## 2021-12-10 DIAGNOSIS — I10 Essential (primary) hypertension: Secondary | ICD-10-CM | POA: Diagnosis not present

## 2021-12-10 DIAGNOSIS — F0390 Unspecified dementia without behavioral disturbance: Secondary | ICD-10-CM | POA: Diagnosis not present

## 2021-12-10 LAB — GLUCOSE, CAPILLARY
Glucose-Capillary: 75 mg/dL (ref 70–99)
Glucose-Capillary: 317 mg/dL — ABNORMAL HIGH (ref 70–99)
Glucose-Capillary: 430 mg/dL — ABNORMAL HIGH (ref 70–99)
Glucose-Capillary: 339 mg/dL — ABNORMAL HIGH (ref 70–99)

## 2021-12-10 LAB — GLUCOSE, RANDOM: Glucose, Bld: 378 mg/dL — ABNORMAL HIGH (ref 70–99)

## 2021-12-10 MED ORDER — INSULIN GLARGINE-YFGN 100 UNIT/ML ~~LOC~~ SOLN
10.0000 [IU] | Freq: Two times a day (BID) | SUBCUTANEOUS | Status: DC
  Administered 2021-12-10 – 2021-12-11 (×2): 10 [IU] via SUBCUTANEOUS
  Filled 2021-12-10 (×4): qty 0.1

## 2021-12-10 MED ORDER — INSULIN GLARGINE-YFGN 100 UNIT/ML ~~LOC~~ SOLN
8.0000 [IU] | Freq: Every day | SUBCUTANEOUS | Status: DC
  Administered 2021-12-10: 8 [IU] via SUBCUTANEOUS
  Filled 2021-12-10 (×2): qty 0.08

## 2021-12-10 MED ORDER — IPRATROPIUM-ALBUTEROL 0.5-2.5 (3) MG/3ML IN SOLN
3.0000 mL | Freq: Four times a day (QID) | RESPIRATORY_TRACT | Status: DC
Start: 2021-12-10 — End: 2021-12-11
  Administered 2021-12-10 – 2021-12-11 (×4): 3 mL via RESPIRATORY_TRACT
  Filled 2021-12-10 (×3): qty 3

## 2021-12-10 MED ORDER — INSULIN ASPART 100 UNIT/ML IJ SOLN
6.0000 [IU] | Freq: Three times a day (TID) | INTRAMUSCULAR | Status: DC
  Administered 2021-12-11: 6 [IU] via SUBCUTANEOUS

## 2021-12-10 MED ORDER — QUETIAPINE FUMARATE 25 MG PO TABS
12.5000 mg | ORAL_TABLET | Freq: Every day | ORAL | Status: DC
  Administered 2021-12-10: 12.5 mg via ORAL
  Filled 2021-12-10: qty 1

## 2021-12-10 NOTE — Progress Notes (Signed)
Patient seen this AM. Alert, oriented only to name. Patient son is at the bedside, physical therapy attempting to work with patient at this time. Patient had tried to have a BM, only a small smear. Cleansed patient. Mittens in place. Call bell within reach of patient/son. ?

## 2021-12-10 NOTE — Progress Notes (Signed)
Patient assisted back into bed with 2 person assist. ?

## 2021-12-10 NOTE — Evaluation (Signed)
Clinical/Bedside Swallow Evaluation ?Patient Details  ?Name: [name] ?MRN: [id number] ?Date of Birth: 11-12-1928 ? ?Today's Date: 12/10/2021 ?Time: SLP Start Time (ACUTE ONLY): 5009 SLP Stop Time (ACUTE ONLY): 3818 ?SLP Time Calculation (min) (ACUTE ONLY): 32 min ? ?Past Medical History:  ?Past Medical History:  ?Diagnosis Date  ? B12 deficiency   ? BPH (benign prostatic hypertrophy)   ? Colon polyps   ? Dementia ([place])   ? per family member  ? Diabetes mellitus   ? GERD (gastroesophageal reflux disease)   ? hiatal hernia  ? History of kidney stones   ? Hyperlipidemia   ? Hypertension   ? Osteoarthritis   ? UTI (urinary tract infection)   ? ?Past Surgical History:  ?Past Surgical History:  ?Procedure Laterality Date  ? APPENDECTOMY    ? CARDIOVASCULAR STRESS TEST  09/02/2011  ? Post stress myocardial perfusion images show normal pattern of perfusion in all areas. No significant wall abnormalites noted.  ? CAROTID DOPPLER  01/27/2011  ? Right & Left ICAs 0-49% diameter reduction, right & left subclavian arteries demonstrated less than 50% diameter reduction.  ? CYSTOSCOPY WITH BIOPSY  06/17/2012  ? Procedure: CYSTOSCOPY WITH BIOPSY;  Surgeon: Molli Hazard, MD;  Location: [facility];  Service: Urology;  Laterality: N/A;  ? KNEE CARTILAGE SURGERY    ? Arthroscope  ? Partial amputaion left foot    ? TRANSTHORACIC ECHOCARDIOGRAM  09/02/2011  ? EF >55%, normal LV systolic function, mild diastolic dysfunction  ? TRANSURETHRAL RESECTION OF PROSTATE  06/17/2012  ? Procedure: TRANSURETHRAL RESECTION OF THE PROSTATE WITH GYRUS INSTRUMENTS;  Surgeon: Molli Hazard, MD;  Location: [facility];  Service: Urology;  Laterality: N/A;  ? ?HPI:  ?88 year old male with history of hypertension, diabetes, admitted to the hospital with altered mental status.  Recently had a fall suffering right-sided rib fractures.  Was started on oxycodone for pain management.  Became increasingly confused, dehydrated.  No obvious signs of  infection on work-up.  Admitted for persistent encephalopathy  ?  ?Assessment / Plan / Recommendation  ?Clinical Impression ? Clinical swallowing evaluation completed while Pt was sitting upright in chair. Pt with overt s/sx of oropharyngeal dysphagia. Pt demonstrated immediate coughing (which causes significant discomfort secondary to broken ribs) with thin liquids and NTL with all presentations. Pt demonstrated occasional throat clearing with HTL but s/sx indicate improved airway protection. Pt managed solid textures with prolonged oral stage and no overt s/sx of aspiration. Recommend continue with regular textures and downgrade to HTL; meds to be administered crushed with puree or pudding. ST will continue to follow for subjective upgrade or possible need for instrumental assessment. Reviewed recommendations at length with Pt's oldest son who was present for evaluation and provided hx. Thank you, ?SLP Visit Diagnosis: Dysphagia, unspecified (R13.10) ?   ?Aspiration Risk ? Moderate aspiration risk  ?  ?Diet Recommendation Regular;Honey-thick liquid  ? ?Liquid Administration via: Cup ?Medication Administration: Crushed with puree ?Supervision: Staff to assist with self feeding ?Compensations: Minimize environmental distractions;Slow rate;Small sips/bites  ?  ?Other  Recommendations Oral Care Recommendations: Oral care BID ?Other Recommendations: Order thickener from pharmacy   ? ?Recommendations for follow up therapy are one component of a multi-disciplinary discharge planning process, led by the attending physician.  Recommendations may be updated based on patient status, additional functional criteria and insurance authorization. ? ?Follow up Recommendations Skilled nursing-short term rehab (<3 hours/day)  ? ? ?  ?   ?   ?Frequency and Duration min 2x/week  ?  1 week ?  ?   ? ?Prognosis Prognosis for Safe Diet Advancement: Fair  ? ?  ? ?Swallow Study   ?General Date of Onset: 12/08/21 ?HPI: 88 year old male with  history of hypertension, diabetes, admitted to the hospital with altered mental status.  Recently had a fall suffering right-sided rib fractures.  Was started on oxycodone for pain management.  Became increasingly confused, dehydrated.  No obvious signs of infection on work-up.  Admitted for persistent encephalopathy ?Type of Study: Bedside Swallow Evaluation ?Previous Swallow Assessment: none in chart ?Diet Prior to this Study: Regular;Thin liquids ?Temperature Spikes Noted: No ?Respiratory Status: Room air ?History of Recent Intubation: No ?Behavior/Cognition: Lethargic/Drowsy;Pleasant mood;Cooperative ?Oral Cavity Assessment: Within Functional Limits ?Oral Care Completed by SLP: Recent completion by staff ?Oral Cavity - Dentition: Adequate natural dentition ?Vision: Functional for self-feeding ?Self-Feeding Abilities: Able to feed self;Needs assist;Needs set up ?Patient Positioning: Upright in chair ?Baseline Vocal Quality: Normal ?Volitional Cough: Strong ?Volitional Swallow: Able to elicit  ?  ?Oral/Motor/Sensory Function Overall Oral Motor/Sensory Function: Within functional limits   ?Ice Chips Ice chips: Impaired ?Presentation: Spoon ?Oral Phase Functional Implications: Prolonged oral transit ?Pharyngeal Phase Impairments: Throat Clearing - Immediate;Cough - Immediate   ?Thin Liquid Thin Liquid: Impaired ?Presentation: Cup;Straw ?Pharyngeal  Phase Impairments: Cough - Immediate;Throat Clearing - Delayed;Throat Clearing - Immediate;Wet Vocal Quality  ?  ?Nectar Thick Nectar Thick Liquid: Impaired ?Presentation: Cup;Straw ?Oral phase functional implications: Prolonged oral transit ?Pharyngeal Phase Impairments: Wet Vocal Quality;Cough - Immediate;Throat Clearing - Delayed   ?Honey Thick Honey Thick Liquid: Impaired ?Presentation: Spoon ?Pharyngeal Phase Impairments: Throat Clearing - Delayed   ?Puree Puree: Within functional limits   ?Solid ? ? ?  Solid: Within functional limits  ? ?  ?[name] Mattson H. Izora Ribas MA,  CCC-SLP ?Speech Language Pathologist ? ?Wende Bushy ?12/10/2021,10:57 AM ? ? ? ?

## 2021-12-10 NOTE — Plan of Care (Signed)
?  Problem: Acute Rehab PT Goals(only PT should resolve) ?Goal: Pt Will Go Supine/Side To Sit ?Outcome: Progressing ?Flowsheets (Taken 12/10/2021 1350) ?Pt will go Supine/Side to Sit: ? with min guard assist ? with minimal assist ?Goal: Patient Will Transfer Sit To/From Stand ?Outcome: Progressing ?Flowsheets (Taken 12/10/2021 1350) ?Patient will transfer sit to/from stand: with min guard assist ?Goal: Pt Will Transfer Bed To Chair/Chair To Bed ?Outcome: Progressing ?Flowsheets (Taken 12/10/2021 1350) ?Pt will Transfer Bed to Chair/Chair to Bed: min guard assist ?Goal: Pt Will Ambulate ?Outcome: Progressing ?Flowsheets (Taken 12/10/2021 1350) ?Pt will Ambulate: ? 25 feet ? with minimal assist ? with moderate assist ? with rolling walker ?  ?1:52 PM, 12/10/21 ?Lonell Grandchild, MPT ?Physical Therapist with [place] ?[facility] ?[phone] office ?[phone] mobile phone ? ?

## 2021-12-10 NOTE — TOC Initial Note (Signed)
Transition of Care (TOC) - Initial/Assessment Note  ? ? ?Patient Details  ?Name: [name] ?MRN: [id number] ?Date of Birth: Sep 17, 1928 ? ?Transition of Care (TOC) CM/SW Contact:    ?Boneta Lucks, RN ?Phone Number: ?12/10/2021, 10:34 AM ? ?Clinical Narrative:      Patient admitted with acute metabolic encephalopathy. Patient lives at home and has 24/7 care. Son, Dellis Filbert is his POA. PT is recommending SNF. Dellis Filbert is agreeable want PNC. PNC is at full census. TOC will sent FL2 out for other bed offers.           ? ? ?Expected Discharge Plan: [place] ?Barriers to Discharge: Continued Medical Work up ? ?Patient Goals and CMS Choice ?Patient states their goals for this hospitalization and ongoing recovery are:: to go to SNF ?CMS Medicare.gov Compare Post Acute Care list provided to:: Patient Represenative (must comment) ?Choice offered to / list presented to : Adult Children, HC POA / Guardian ? ?Expected Discharge Plan and Services ?Expected Discharge Plan: [place] ?  ?   ?Living arrangements for the past 2 months: [place] ?                ?   ?Prior Living Arrangements/Services ?Living arrangements for the past 2 months: [place] ?Lives with:: Other (Comment) (Sitters 24/7) ?Patient language and need for interpreter reviewed:: Yes ?       ?Need for Family Participation in Patient Care: Yes (Comment) ?Care giver support system in place?: Yes (comment) ?Current home services: DME ?Criminal Activity/Legal Involvement Pertinent to Current Situation/Hospitalization: No - Comment as needed ? ?Activities of Daily Living ?Home Assistive Devices/Equipment: None ?ADL Screening (condition at time of admission) ?Patient's cognitive ability adequate to safely complete daily activities?: No ?Is the patient deaf or have difficulty hearing?: No ?Does the patient have difficulty seeing, even when wearing glasses/contacts?: Yes ?Does the patient have difficulty  concentrating, remembering, or making decisions?: Yes ?Patient able to express need for assistance with ADLs?: No ?Does the patient have difficulty dressing or bathing?: Yes ?Independently performs ADLs?: No ?Communication: Needs assistance ?Is this a change from baseline?: Change from baseline, expected to last <3 days ?Dressing (OT): Dependent ?Is this a change from baseline?: Change from baseline, expected to last <3days ?Grooming: Needs assistance ?Is this a change from baseline?: Change from baseline, expected to last <3 days ?Feeding: Needs assistance ?Is this a change from baseline?: Change from baseline, expected to last <3 days ?Bathing: Needs assistance ?Is this a change from baseline?: Change from baseline, expected to last <3 days ?Toileting: Needs assistance ?Is this a change from baseline?: Change from baseline, expected to last <3 days ?In/Out Bed: Needs assistance ?Is this a change from baseline?: Change from baseline, expected to last <3 days ?Walks in Home: Needs assistance ?Is this a change from baseline?: Change from baseline, expected to last <3 days ?Does the patient have difficulty walking or climbing stairs?: Yes ?Weakness of Legs: Both ?Weakness of Arms/Hands: None ? ?Permission Sought/Granted ?  ?Emotional Assessment ?  ?   ?Orientation: : Oriented to Self ?Alcohol / Substance Use: Not Applicable ?Psych Involvement: No (comment) ? ?Admission diagnosis:  Altered mental status, unspecified altered mental status type [R41.82] ?Acute metabolic encephalopathy [H84.69] ?Acute encephalopathy [G93.40] ?Patient Active Problem List  ? Diagnosis Date Noted  ? Acute encephalopathy 12/09/2021  ? Acute metabolic encephalopathy 62/95/2841  ? Dementia without behavioral disturbance ([place]) 12/08/2021  ? Encounter for therapeutic drug level monitoring 05/30/2021  ?  Esophageal reflux 05/30/2021  ? Osteoarthrosis, pelvic region and thigh 05/30/2021  ? Benign essential hypertension 05/30/2021  ? Pain due to  onychomycosis of toenails of both feet 01/24/2019  ? Diabetes mellitus without complication ([place]) 85/27/7824  ? Pain in right knee 08/09/2018  ? Type 2 diabetes mellitus without complication ([place]) 23/53/6144  ? PAC (premature atrial contraction) 05/21/2015  ? S/P TURP (status post transurethral resection of prostate) 11/16/2013  ? UTI (urinary tract infection) 11/16/2013  ? Aortic sclerosis 11/16/2013  ? Chronic kidney disease, stage II (mild) 08/02/2013  ? Hypertension   ? Hyperlipidemia   ? B12 deficiency   ? Diabetes mellitus ([place]) 03/01/2013  ? Pure hypercholesterolemia 03/01/2013  ? Benign prostatic hyperplasia 10/21/2010  ? Rhinitis 10/21/2010  ? History of kidney stones 10/21/2010  ? UNSPECIFIED ANEMIA 03/26/2009  ? NONSPECIFIC ABNORMAL FINDING IN STOOL CONTENTS 03/26/2009  ? PERSONAL HISTORY OF COLONIC POLYPS 03/26/2009  ? ?PCP:  Susy Frizzle, MD ?Pharmacy:   ?[id number]?Phone: [phone]: [phone]? ?[place] [address] [place]?Phone: [phone]: [phone]? ? ?Readmission Risk Interventions ? ?  12/10/2021  ? 10:33 AM  ?Readmission Risk Prevention Plan  ?Transportation Screening Complete  ?Home Care Screening Complete  ?Medication Review (RN CM) Complete  ? ? ? ?

## 2021-12-10 NOTE — NC FL2 (Signed)
?[place] MEDICAID FL2 LEVEL OF CARE SCREENING TOOL  ?  ? ?IDENTIFICATION  ?Patient Name: ?[name] Birthdate: [birth year] Sex: male Admission Date (Current Location): ?12/08/2021  ?[place] and [place] Number: ? [place] and Address:  ?[facility] [address], [place] ?     Provider Number: ?[id number]  ?Attending Physician Name and Address:  ?Kathie Dike, MD ? Relative Name and Phone Number:  ?Rece Zechman ( SOn) [phone] ?   ?Current Level of Care: ?Hospital Recommended Level of Care: ?[place] Prior Approval Number: ?  ? ?Date Approved/Denied: ?  PASRR Number: ?[id number] ? ?Discharge Plan: ?Home ?  ? ?Current Diagnoses: ?Patient Active Problem List  ? Diagnosis Date Noted  ? Acute encephalopathy 12/09/2021  ? Acute metabolic encephalopathy 88/75/7972  ? Dementia without behavioral disturbance ([place]) 12/08/2021  ? Encounter for therapeutic drug level monitoring 05/30/2021  ? Esophageal reflux 05/30/2021  ? Osteoarthrosis, pelvic region and thigh 05/30/2021  ? Benign essential hypertension 05/30/2021  ? Pain due to onychomycosis of toenails of both feet 01/24/2019  ? Diabetes mellitus without complication ([place]) 82/12/154  ? Pain in right knee 08/09/2018  ? Type 2 diabetes mellitus without complication ([place]) 15/37/9432  ? PAC (premature atrial contraction) 05/21/2015  ? S/P TURP (status post transurethral resection of prostate) 11/16/2013  ? UTI (urinary tract infection) 11/16/2013  ? Aortic sclerosis 11/16/2013  ? Chronic kidney disease, stage II (mild) 08/02/2013  ? Hypertension   ? Hyperlipidemia   ? B12 deficiency   ? Diabetes mellitus ([place]) 03/01/2013  ? Pure hypercholesterolemia 03/01/2013  ? Benign prostatic hyperplasia 10/21/2010  ? Rhinitis 10/21/2010  ? History of kidney stones 10/21/2010  ? UNSPECIFIED ANEMIA 03/26/2009  ? NONSPECIFIC ABNORMAL FINDING IN STOOL CONTENTS 03/26/2009  ? PERSONAL HISTORY OF COLONIC POLYPS  03/26/2009  ? ? ?Orientation RESPIRATION BLADDER Height & Weight   ?  ?Self ? Normal Continent Weight: 86.2 kg ?Height:  '5\' 10"'$  (177.8 cm)  ?BEHAVIORAL SYMPTOMS/MOOD NEUROLOGICAL BOWEL NUTRITION STATUS  ?    Continent Diet (See DC summary)  ?AMBULATORY STATUS COMMUNICATION OF NEEDS Skin   ?Extensive Assist Verbally Normal ?  ?  ?  ?    ?     ?     ? ? ?Personal Care Assistance Level of Assistance  ?Bathing, Dressing, Feeding Bathing Assistance: Maximum assistance ?Feeding assistance: Limited assistance ?Dressing Assistance: Limited assistance ?   ? ?Functional Limitations Info  ?Sight, Hearing, Speech Sight Info: Impaired ?Hearing Info: Impaired ?Speech Info: Adequate  ? ? ?SPECIAL CARE FACTORS FREQUENCY  ?    ?  ?  ?  ?  ?  ?  ?   ? ? ?Contractures Contractures Info: Not present  ? ? ?Additional Factors Info  ?Code Status, Allergies Code Status Info: DNR ?Allergies Info: Avodart, Ibuprofen, Morphine ?  ?  ?  ?   ? ?Current Medications (12/10/2021):  This is the current hospital active medication list ?Current Facility-Administered Medications  ?Medication Dose Route Frequency Provider Last Rate Last Admin  ? acetaminophen (TYLENOL) tablet 650 mg  650 mg Oral Q6H PRN Emokpae, Ejiroghene E, MD      ? Or  ? acetaminophen (TYLENOL) suppository 650 mg  650 mg Rectal Q6H PRN Emokpae, Ejiroghene E, MD      ? atenolol (TENORMIN) tablet 12.5 mg  12.5 mg Oral BID Emokpae, Ejiroghene E, MD   12.5 mg at 12/10/21 0923  ? donepezil (ARICEPT) tablet 10 mg  10  mg Oral QHS Kathie Dike, MD      ? enoxaparin (LOVENOX) injection 40 mg  40 mg Subcutaneous Q24H Emokpae, Ejiroghene E, MD   40 mg at 12/09/21 2131  ? insulin aspart (novoLOG) injection 0-15 Units  0-15 Units Subcutaneous TID WC Emokpae, Ejiroghene E, MD      ? insulin aspart (novoLOG) injection 0-5 Units  0-5 Units Subcutaneous QHS Emokpae, Ejiroghene E, MD      ? memantine (NAMENDA) tablet 5 mg  5 mg Oral BID Emokpae, Ejiroghene E, MD   5 mg at 12/10/21 2130  ?  ondansetron (ZOFRAN) tablet 4 mg  4 mg Oral Q6H PRN Emokpae, Ejiroghene E, MD      ? Or  ? ondansetron (ZOFRAN) injection 4 mg  4 mg Intravenous Q6H PRN Emokpae, Ejiroghene E, MD      ? polyethylene glycol (MIRALAX / GLYCOLAX) packet 17 g  17 g Oral Daily PRN Emokpae, Ejiroghene E, MD      ? QUEtiapine (SEROQUEL) tablet 25 mg  25 mg Oral QHS PRN Kathie Dike, MD      ? ? ? ?Discharge Medications: ?Please see discharge summary for a list of discharge medications. ? ?Relevant Imaging Results: ? ?Relevant Lab Results: ? ? ?Additional Information ?SS# [id number] ? ?Boneta Lucks, RN ? ? ? ? ?

## 2021-12-10 NOTE — Progress Notes (Signed)
Inpatient Diabetes Program Recommendations ? ?AACE/ADA: New Consensus Statement on Inpatient Glycemic Control (2015) ? ?Target Ranges:  Prepandial:   less than 140 mg/dL ?     Peak postprandial:   less than 180 mg/dL (1-2 hours) ?     Critically ill patients:  140 - 180 mg/dL  ? ?Lab Results  ?Component Value Date  ? GLUCAP 317 (H) 12/10/2021  ? HGBA1C 8.7 (H) 10/31/2021  ? ? ?Review of Glycemic Control ? Latest Reference Range & Units 12/09/21 07:42 12/09/21 11:49 12/09/21 17:15 12/09/21 21:38 12/10/21 07:16 12/10/21 11:41  ?Glucose-Capillary 70 - 99 mg/dL 89 113 (H) 88 95 75 317 (H)  ?(H): Data is abnormally high ? ?Diabetes history: DM2 ?Outpatient Diabetes medications: Tresiba 16 units qd, Metformin 1 gm qam, 500 mg q pm, Jardiance 25 mg qd ?Current orders for Inpatient glycemic control: Novolog correction 0-15 units tid, 0-5 units hs ? ?Inpatient Diabetes Program Recommendations:   ?Please consider: ?-Semglee 8 units qd (50% home basal dose) ?-Decrease Novolog 0-9 units tid, hs 0-5 units ? ?A1c 10/31/21 @ Dr. Samella Parr office was 8.7. ? ? ?Thank you, ?Nani Gasser Castella Lerner, RN, MSN, CDE  ?Diabetes Coordinator ?Inpatient Glycemic Control Team ?Team Pager [phone] (8am-5pm) ?12/10/2021 1:08 PM ? ? ? ? ?

## 2021-12-10 NOTE — Progress Notes (Addendum)
?PROGRESS NOTE ? ? ? ?[name]  PJA:[id number] DOB: 1929/06/24 DOA: 12/08/2021 ?PCP: Susy Frizzle, MD  ? ? ?Brief Narrative:  ?88 year old male with history of hypertension, diabetes, admitted to the hospital with altered mental status.  Recently had a fall suffering right-sided rib fractures.  Was started on oxycodone for pain management.  Became increasingly confused, dehydrated.  No obvious signs of infection on work-up.  Admitted for persistent encephalopathy ? ? ?Assessment & Plan: ?  ?Principal Problem: ?  Acute metabolic encephalopathy ?Active Problems: ?  Diabetes mellitus ([place]) ?  Hypertension ?  Dementia without behavioral disturbance ([place]) ?  Acute encephalopathy ? ? ?Acute toxic metabolic encephalopathy due to dehydration and oxycodone with acute delirium  ?-Does not appear to have any obvious infection, chest x-ray clear urinalysis is not indicative of infection ?-He did have some dehydration on arrival, but overall labs are better with IV fluids ?-He had been taking oxycodone prior to admission which has since been held ?-Son also said that he had been awake for prolonged period of time which is likely also contributing to delirium ?-After receiving several sedatives including Ativan, Haldol and Geodon, patient was able to sleep, and slept through most the day on 5/15 ?-Overall mental status has improved, and appears to be approaching baseline ? ?Dysphagia ?-Noted by staff to have coughing episodes after trying to eat or drink ?-Speech therapy consulted ?-Currently on honey thick liquids, may be able to get modified barium study tomorrow ?-Oxygen saturations noted to be down to 90%, started on 2 L of oxygen.  We will try and discontinue in the next 24 hours. Check chest xray for aspiration ? ?Dementia ?-Mental status improved, approaching baseline ?-Continue on donepezil and memantine ? ?Diabetes mellitus, type II, uncontrolled with hyperglycemia ?-Chronically on metformin, Prandin, Tresiba  and NovoLog ?-Overall p.o. intake has improved ?-Started on basal insulin, will also add meal coverage NovoLog ?-Continue sliding scale ? ?Hypertension ?-Continue on home dose of atenolol ? ?Recent rib fractures ?-Treat supportively ?-Cautious use of pain medication with current delirium ? ?Generalized weakness ?-Skilled nursing facility placement ? ? ?DVT prophylaxis: enoxaparin (LOVENOX) injection 40 mg Start: 12/08/21 2030 ? ?Code Status: DNR ?Family Communication: Discussed with son at the bedside ?Disposition Plan: Status is: Inpatient ?Remains inpatient appropriate because: Continued mental status changes, not back to baseline ? ? ? ? ?Consultants:  ? ? ?Procedures:  ? ? ?Antimicrobials:  ?  ? ? ?Subjective: ?Patient's son reports that he slept through most of the day and probably through the night.  Patient is much more calm and coherent today.  Still has some confusion though.  Staff has noted difficulty swallowing which results in coughing when he tries to eat or drink ? ?Objective: ?Vitals:  ? 12/09/21 2118 12/10/21 0555 12/10/21 1403 12/10/21 1946  ?BP: (!) 144/61 (!) 152/68 (!) 145/66   ?Pulse: 72 65 78   ?Resp: '19 15 20   '$ ?Temp: 98.6 ?F (37 ?C) 97.8 ?F (36.6 ?C) 98.6 ?F (37 ?C)   ?TempSrc: Oral Oral Oral   ?SpO2: 90% 91% 90% 91%  ?Weight:      ?Height:      ? ? ?Intake/Output Summary (Last 24 hours) at 12/10/2021 2033 ?Last data filed at 12/10/2021 1300 ?Gross per 24 hour  ?Intake 714 ml  ?Output 500 ml  ?Net 214 ml  ? ?Filed Weights  ? 12/08/21 1850  ?Weight: 86.2 kg  ? ? ?Examination: ? ?General exam: Sitting up in chair, no distress ?Respiratory  system: Clear to auscultation. Respiratory effort normal. ?Cardiovascular system:RRR. No murmurs, rubs, gallops. ?Gastrointestinal system: Abdomen is nondistended, soft and nontender. No organomegaly or masses felt. Normal bowel sounds heard. ?Central nervous system: No focal neurological deficits. ?Extremities: No C/C/E, +pedal pulses ?Skin: No rashes, lesions  or ulcers ?Psychiatry: Confused, but calm and pleasant ? ? ? ? ?Data Reviewed: I have personally reviewed following labs and imaging studies ? ?CBC: ?Recent Labs  ?Lab 12/08/21 ?1300 12/09/21 ?0445  ?WBC 8.2 8.1  ?NEUTROABS 5.7  --   ?HGB 13.3 12.8*  ?HCT 40.2 37.2*  ?MCV 100.2* 96.9  ?PLT 174 180  ? ?Basic Metabolic Panel: ?Recent Labs  ?Lab 12/08/21 ?1300 12/09/21 ?0445 12/10/21 ?1821  ?NA 136 138  --   ?K 3.9 3.7  --   ?CL 104 107  --   ?CO2 24 24  --   ?GLUCOSE 219* 70 378*  ?BUN 28* 18  --   ?CREATININE 1.27* 1.05  --   ?CALCIUM 8.7* 8.5*  --   ? ?GFR: ?Estimated Creatinine Clearance: 48.3 mL/min (by C-G formula based on SCr of 1.05 mg/dL). ?Liver Function Tests: ?Recent Labs  ?Lab 12/08/21 ?1300  ?AST 21  ?ALT 20  ?ALKPHOS 85  ?BILITOT 0.5  ?PROT 7.4  ?ALBUMIN 3.6  ? ?No results for input(s): LIPASE, AMYLASE in the last 168 hours. ?No results for input(s): AMMONIA in the last 168 hours. ?Coagulation Profile: ?No results for input(s): INR, PROTIME in the last 168 hours. ?Cardiac Enzymes: ?No results for input(s): CKTOTAL, CKMB, CKMBINDEX, TROPONINI in the last 168 hours. ?BNP (last 3 results) ?No results for input(s): PROBNP in the last 8760 hours. ?HbA1C: ?No results for input(s): HGBA1C in the last 72 hours. ?CBG: ?Recent Labs  ?Lab 12/09/21 ?1715 12/09/21 ?2138 12/10/21 ?0716 12/10/21 ?1141 12/10/21 ?1713  ?GLUCAP 88 95 75 317* 430*  ? ?Lipid Profile: ?No results for input(s): CHOL, HDL, LDLCALC, TRIG, CHOLHDL, LDLDIRECT in the last 72 hours. ?Thyroid Function Tests: ?No results for input(s): TSH, T4TOTAL, FREET4, T3FREE, THYROIDAB in the last 72 hours. ?Anemia Panel: ?No results for input(s): VITAMINB12, FOLATE, FERRITIN, TIBC, IRON, RETICCTPCT in the last 72 hours. ?Sepsis Labs: ?No results for input(s): PROCALCITON, LATICACIDVEN in the last 168 hours. ? ?No results found for this or any previous visit (from the past 240 hour(s)).  ? ? ? ? ? ?Radiology Studies: ?DG CHEST PORT 1 VIEW ? ?Result Date:  12/10/2021 ?CLINICAL DATA:  Altered mental status, cough EXAM: PORTABLE CHEST 1 VIEW COMPARISON:  Portable exam at 1449 hrs compared to 12/08/2021 FINDINGS: Normal heart size, mediastinal contours, and pulmonary vascularity. Lungs clear. No pulmonary infiltrate, pleural effusion, or pneumothorax. Fractures of posterior RIGHT 6th 7th 8th ribs. IMPRESSION: No acute abnormalities. Electronically Signed   By: Lavonia Dana M.D.   On: 12/10/2021 15:17   ? ? ? ? ? ?Scheduled Meds: ? atenolol  12.5 mg Oral BID  ? donepezil  10 mg Oral QHS  ? enoxaparin (LOVENOX) injection  40 mg Subcutaneous Q24H  ? insulin aspart  0-15 Units Subcutaneous TID WC  ? insulin aspart  0-5 Units Subcutaneous QHS  ? [START ON 12/11/2021] insulin aspart  6 Units Subcutaneous TID WC  ? insulin glargine-yfgn  10 Units Subcutaneous BID  ? ipratropium-albuterol  3 mL Nebulization Q6H  ? memantine  5 mg Oral BID  ? QUEtiapine  12.5 mg Oral QHS  ? ?Continuous Infusions: ? ? LOS: 1 day  ? ? ?Time spent: 49mns ? ? ? ?JWinn-Dixie  Roderic Palau, MD ?[facility] ? ? ?If 7PM-7AM, please contact night-coverage ?[email or web address] ? ?12/10/2021, 8:33 PM  ? ?

## 2021-12-10 NOTE — Evaluation (Signed)
Physical Therapy Evaluation ?Patient Details ?Name: [name] ?MRN: [id number] ?DOB: 10-Feb-1929 ?Today's Date: 12/10/2021 ? ?History of Present Illness ? [name] is a 88 y.o. male with medical history significant for HTN, DM, BPH s/p TURP.   Patient was brought to the ED from home with reports of altered mental status.     Patient to the ED 5/7 with reports of a fall- patient had wandered out of the house and fallen on his left side, was found by his son.  He has since had 24-hour supervision.  CT chest abdomen and pelvis was obtained which showed-acute posterolateral eighth and ninth rib fractures, with small anterior right basilar pneumothorax.  Head CT was unremarkable.  Subsequent follow-up chest x-ray showed no pneumothorax on plain x-ray.  He was discharged on oxycodone which he was taking and was doing well until 2 days ago.  Over the past 2 days, patient has been getting increasingly confused, hallucinating, grabbing at things or objects in front of him, that are not actually.  Patient's son reports that patient's last took a nap at about 10 yesterday otherwise he has been awake since then did not sleep all night. His appetite has been good, and his family has tried to keep him hydrated.  No vomiting no loose stools.  Patient has been asking to use the bathroom to urinate quite frequently, and son was quite convinced that patient had a UTI.  Patient's son reports that patient historically is quite sensitive to opioid medications.     On my evaluation, patient is able to answer simple questions.  At baseline patient's son reports that patient is able to hold a conversation, recognize family, ambulates most times with him walker. ?  ?Clinical Impression ? Patient demonstrates slow labored movement for sitting up at bedside, very unsteady on feet limited to short labored side steps at bedside before having to sit due to fatigue and generalized weakness.  Patient able to transfer to chair and then  to [facility] and tolerated sitting on BSC to attempt bowel movement with his son present in room.  Patient will benefit from continued skilled physical therapy in hospital and recommended venue below to increase strength, balance, endurance for safe ADLs and gait.  ?   ?   ? ?Recommendations for follow up therapy are one component of a multi-disciplinary discharge planning process, led by the attending physician.  Recommendations may be updated based on patient status, additional functional criteria and insurance authorization. ? ?Follow Up Recommendations Skilled nursing-short term rehab (<3 hours/day) ? ?  ?Assistance Recommended at Discharge    ?Patient can return home with the following ? A lot of help with bathing/dressing/bathroom;A lot of help with walking and/or transfers;Help with stairs or ramp for entrance;Assistance with cooking/housework ? ?  ?Equipment Recommendations None recommended by PT  ?Recommendations for Other Services ?    ?  ?Functional Status Assessment Patient has had a recent decline in their functional status and demonstrates the ability to make significant improvements in function in a reasonable and predictable amount of time.  ? ?  ?Precautions / Restrictions Precautions ?Precautions: Fall ?Restrictions ?Weight Bearing Restrictions: No  ? ?  ? ?Mobility ? Bed Mobility ?Overal bed mobility: Needs Assistance ?Bed Mobility: Supine to Sit ?  ?  ?Supine to sit: Min assist ?  ?  ?General bed mobility comments: increased time, labored movement ?  ? ?Transfers ?Overall transfer level: Needs assistance ?Equipment used: Rolling walker (2 wheels) ?Transfers: Sit to/from Stand,  Bed to chair/wheelchair/BSC ?Sit to Stand: Min assist, Mod assist ?  ?Step pivot transfers: Mod assist ?  ?  ?  ?General transfer comment: unsteady labored movement ?  ? ?Ambulation/Gait ?  ?Gait Distance (Feet): 5 Feet ?Assistive device: Rolling walker (2 wheels) ?Gait Pattern/deviations: Decreased step length - right, Decreased  step length - left, Decreased stride length ?Gait velocity: decreased ?  ?  ?General Gait Details: limited to a few slow labored side steps at bedside before having to sit due to c/o fatigue ? ?Stairs ?  ?  ?  ?  ?  ? ?Wheelchair Mobility ?  ? ?Modified Rankin (Stroke Patients Only) ?  ? ?  ? ?Balance Overall balance assessment: Needs assistance ?Sitting-balance support: Feet supported, No upper extremity supported ?Sitting balance-Leahy Scale: Fair ?Sitting balance - Comments: fair/good seated at EOB ?  ?Standing balance support: During functional activity, No upper extremity supported ?Standing balance-Leahy Scale: Poor ?Standing balance comment: fair/poor using RW ?  ?  ?  ?  ?  ?  ?  ?  ?  ?  ?  ?   ? ? ? ?Pertinent Vitals/Pain Pain Assessment ?Pain Assessment: No/denies pain  ? ? ?Home Living Family/patient expects to be discharged to:: Private residence ?Living Arrangements: Alone ?Available Help at Discharge: Personal care attendant;Available 24 hours/day ?Type of Home: House ?Home Access: Ramped entrance ?  ?  ?  ?Home Layout: One level ?Home Equipment: Conservation officer, nature (2 wheels);Cane - quad;Shower seat;BSC/3in1 ?   ?  ?Prior Function Prior Level of Function : Needs assist ?  ?  ?  ?Physical Assist : Mobility (physical);ADLs (physical) ?Mobility (physical): Bed mobility;Transfers;Gait;Stairs ?  ?Mobility Comments: household ambulator using RW ?ADLs Comments: home aides 24/7 ?  ? ? ?Hand Dominance  ?   ? ?  ?Extremity/Trunk Assessment  ? Upper Extremity Assessment ?Upper Extremity Assessment: Generalized weakness ?  ? ?Lower Extremity Assessment ?Lower Extremity Assessment: Generalized weakness ?  ? ?Cervical / Trunk Assessment ?Cervical / Trunk Assessment: Normal  ?Communication  ? Communication: No difficulties  ?Cognition Arousal/Alertness: Awake/alert ?Behavior During Therapy: [facility] for tasks assessed/performed ?Overall Cognitive Status: History of cognitive impairments - at baseline ?  ?  ?  ?  ?  ?  ?  ?   ?  ?  ?  ?  ?  ?  ?  ?  ?  ?  ?  ? ?  ?General Comments   ? ?  ?Exercises    ? ?Assessment/Plan  ?  ?PT Assessment Patient needs continued PT services  ?PT Problem List Decreased activity tolerance;Decreased strength;Decreased balance;Decreased mobility ? ?   ?  ?PT Treatment Interventions DME instruction;Gait training;Stair training;Functional mobility training;Therapeutic activities;Therapeutic exercise;Balance training;Patient/family education   ? ?PT Goals (Current goals can be found in the Care Plan section)  ?Acute Rehab PT Goals ?Patient Stated Goal: return home with family, home aides to assist ?PT Goal Formulation: With patient/family ?Time For Goal Achievement: 12/24/21 ?Potential to Achieve Goals: Good ? ?  ?Frequency Min 3X/week ?  ? ? ?Co-evaluation   ?  ?  ?  ?  ? ? ?  ?AM-PAC PT "6 Clicks" Mobility  ?Outcome Measure Help needed turning from your back to your side while in a flat bed without using bedrails?: A Little ?Help needed moving from lying on your back to sitting on the side of a flat bed without using bedrails?: A Little ?Help needed moving to and from a bed to a chair (including  a wheelchair)?: A Lot ?Help needed standing up from a chair using your arms (e.g., wheelchair or bedside chair)?: A Lot ?Help needed to walk in hospital room?: A Lot ?Help needed climbing 3-5 steps with a railing? : A Lot ?6 Click Score: 14 ? ?  ?End of Session   ?Activity Tolerance: Patient tolerated treatment well;Patient limited by fatigue ?Patient left: with call bell/phone within reach;Other (comment);with family/visitor present (seated on BSC) ?Nurse Communication: Mobility status ?PT Visit Diagnosis: Unsteadiness on feet (R26.81);Other abnormalities of gait and mobility (R26.89);Muscle weakness (generalized) (M62.81) ?  ? ?Time: 7737-3668 ?PT Time Calculation (min) (ACUTE ONLY): 20 min ? ? ?Charges:   PT Evaluation ?$PT Eval Moderate Complexity: 1 Mod ?PT Treatments ?$Therapeutic Activity: 8-22 mins ?  ?    ? ? ?1:50 PM, 12/10/21 ?Lonell Grandchild, MPT ?Physical Therapist with [place] ?[facility] ?[phone] office ?[phone] mobile phone ? ? ?

## 2021-12-11 ENCOUNTER — Inpatient Hospital Stay (HOSPITAL_COMMUNITY): Payer: Medicare Other

## 2021-12-11 DIAGNOSIS — G9341 Metabolic encephalopathy: Secondary | ICD-10-CM | POA: Diagnosis not present

## 2021-12-11 LAB — CBC
HCT: 40.1 % (ref 39.0–52.0)
Hemoglobin: 13.3 g/dL (ref 13.0–17.0)
MCH: 32.2 pg (ref 26.0–34.0)
MCHC: 33.2 g/dL (ref 30.0–36.0)
MCV: 97.1 fL (ref 80.0–100.0)
Platelets: 214 10*3/uL (ref 150–400)
RBC: 4.13 MIL/uL — ABNORMAL LOW (ref 4.22–5.81)
RDW: 12.7 % (ref 11.5–15.5)
WBC: 11.6 10*3/uL — ABNORMAL HIGH (ref 4.0–10.5)
nRBC: 0 % (ref 0.0–0.2)

## 2021-12-11 LAB — BASIC METABOLIC PANEL
Anion gap: 9 (ref 5–15)
BUN: 34 mg/dL — ABNORMAL HIGH (ref 8–23)
CO2: 22 mmol/L (ref 22–32)
Calcium: 8.9 mg/dL (ref 8.9–10.3)
Chloride: 108 mmol/L (ref 98–111)
Creatinine, Ser: 1.43 mg/dL — ABNORMAL HIGH (ref 0.61–1.24)
GFR, Estimated: 47 mL/min — ABNORMAL LOW (ref >60)
Glucose, Bld: 186 mg/dL — ABNORMAL HIGH (ref 70–99)
Potassium: 3.6 mmol/L (ref 3.5–5.1)
Sodium: 139 mmol/L (ref 135–145)

## 2021-12-11 LAB — GLUCOSE, CAPILLARY
Glucose-Capillary: 197 mg/dL — ABNORMAL HIGH (ref 70–99)
Glucose-Capillary: 326 mg/dL — ABNORMAL HIGH (ref 70–99)

## 2021-12-11 MED ORDER — LORAZEPAM 2 MG/ML IJ SOLN
1.0000 mg | INTRAMUSCULAR | Status: DC | PRN
  Administered 2021-12-12: 1 mg via INTRAVENOUS
  Filled 2021-12-11: qty 1

## 2021-12-11 MED ORDER — HYDROMORPHONE HCL 1 MG/ML IJ SOLN: 0.5000 mg | INTRAMUSCULAR | Status: DC | PRN

## 2021-12-11 MED ORDER — LORAZEPAM 1 MG PO TABS: 1.0000 mg | ORAL_TABLET | ORAL | Status: DC | PRN

## 2021-12-11 MED ORDER — GLYCOPYRROLATE 0.2 MG/ML IJ SOLN: 0.2000 mg | INTRAMUSCULAR | Status: DC | PRN

## 2021-12-11 MED ORDER — INSULIN ASPART 100 UNIT/ML IJ SOLN: 8.0000 [IU] | Freq: Three times a day (TID) | INTRAMUSCULAR | Status: DC

## 2021-12-11 MED ORDER — LORAZEPAM 2 MG/ML PO CONC: 1.0000 mg | ORAL | Status: DC | PRN

## 2021-12-11 MED ORDER — GLYCOPYRROLATE 1 MG PO TABS: 1.0000 mg | ORAL_TABLET | ORAL | Status: DC | PRN

## 2021-12-11 MED ORDER — LACTATED RINGERS IV SOLN: INTRAVENOUS | Status: DC

## 2021-12-11 NOTE — Progress Notes (Signed)
?PROGRESS NOTE ? ? ? ?[name]  LKJ:[id number] DOB: 1929/04/09 DOA: 12/08/2021 ?PCP: Susy Frizzle, MD ? ? ?Brief Narrative:  ?  ?88 year old male with history of hypertension, diabetes, admitted to the hospital with altered mental status.  Recently had a fall suffering right-sided rib fractures.  Was started on oxycodone for pain management.  Became increasingly confused, dehydrated.  No obvious signs of infection on work-up.  Admitted for persistent encephalopathy. ? ?Assessment & Plan: ?  ?Principal Problem: ?  Acute metabolic encephalopathy ?Active Problems: ?  Diabetes mellitus ([place]) ?  Hypertension ?  Dementia without behavioral disturbance ([place]) ?  Acute encephalopathy ?  Dysphagia ? ?Assessment and Plan: ? ? ?Acute toxic metabolic encephalopathy due to dehydration and oxycodone with acute delirium  ?-Does not appear to have any obvious infection, chest x-ray clear urinalysis is not indicative of infection ?-He did have some dehydration on arrival, but overall labs are better with IV fluids ?-He had been taking oxycodone prior to admission which has since been held ?-Son also said that he had been awake for prolonged period of time which is likely also contributing to delirium ?-After receiving several sedatives including Ativan, Haldol and Geodon, patient was able to sleep, and slept through most the day on 5/15 ?-Overall mental status continue to fluctuate and patient will require telemetry sitter for closer monitoring ?-Fall precautions initiated  ?  ?Dysphagia ?-Noted by staff to have coughing episodes after trying to eat or drink ?-Speech therapy consulted ?-Currently on honey thick liquids, may be able to get modified barium study  ?-Oxygen saturations noted to be down to 90%, started on 2 L of oxygen.  We will try and discontinue in the next 24 hours. ?-Chest x-ray without aspiration ?  ?Dementia ?-Mental status improved, approaching baseline ?-Continue on donepezil and memantine ?   ?Diabetes mellitus, type II, uncontrolled with hyperglycemia ?-Chronically on metformin, Prandin, Tresiba and NovoLog ?-Overall p.o. intake has improved ?-Started on basal insulin, will also add meal coverage NovoLog ?-Continue sliding scale ?-Increase mealtime NovoLog coverage per coordinator recommendations ?  ?Hypertension ?-Continue on home dose of atenolol ?  ?Recent rib fractures ?-Treat supportively ?-Cautious use of pain medication with current delirium ?  ?Generalized weakness ?-Skilled nursing facility placement ?-Fall precautions ?  ?  ?DVT prophylaxis: enoxaparin (LOVENOX) injection 40 mg Start: 12/08/21 2030 ?  ?Code Status: DNR ?Family Communication: Discussed with son at the bedside ?Disposition Plan: Status is: Inpatient ?Remains inpatient appropriate because: Continued mental status changes, not back to baseline ?  ?   ?Consultants:  ? None ?  ?Procedures:  ? None ?  ?Antimicrobials:  ? None ?  ?  ?Subjective: ?Patient seen and evaluated today with no new acute complaints or concerns. No acute concerns or events noted overnight.  He continues to remain confused.  He was apparently noted to nearly have a fall this morning. ? ?Objective: ?Vitals:  ? 12/11/21 0542 12/11/21 0821 12/11/21 1319 12/11/21 1405  ?BP: (!) 101/50  (!) 109/51   ?Pulse: 80     ?Resp: 15     ?Temp: 98.2 ?F (36.8 ?C)     ?TempSrc: Oral     ?SpO2: 93% 94%  95%  ?Weight:      ?Height:      ? ? ?Intake/Output Summary (Last 24 hours) at 12/11/2021 1424 ?Last data filed at 12/11/2021 1300 ?Gross per 24 hour  ?Intake 360 ml  ?Output --  ?Net 360 ml  ? ?Filed Weights  ?  12/08/21 1850  ?Weight: 86.2 kg  ? ? ?Examination: ? ?General exam: Appears calm and comfortable, confused ?Respiratory system: Clear to auscultation. Respiratory effort normal.  Nasal cannula oxygen ?Cardiovascular system: S1 & S2 heard, RRR.  ?Gastrointestinal system: Abdomen is soft ?Central nervous system: Alert and awake ?Extremities: No edema ?Skin: No significant  lesions noted ?Psychiatry: Flat affect. ? ? ? ?Data Reviewed: I have personally reviewed following labs and imaging studies ? ?CBC: ?Recent Labs  ?Lab 12/08/21 ?1300 12/09/21 ?0445 12/11/21 ?0522  ?WBC 8.2 8.1 11.6*  ?NEUTROABS 5.7  --   --   ?HGB 13.3 12.8* 13.3  ?HCT 40.2 37.2* 40.1  ?MCV 100.2* 96.9 97.1  ?PLT 174 180 214  ? ?Basic Metabolic Panel: ?Recent Labs  ?Lab 12/08/21 ?1300 12/09/21 ?0445 12/10/21 ?1821 12/11/21 ?0522  ?NA 136 138  --  139  ?K 3.9 3.7  --  3.6  ?CL 104 107  --  108  ?CO2 24 24  --  22  ?GLUCOSE 219* 70 378* 186*  ?BUN 28* 18  --  34*  ?CREATININE 1.27* 1.05  --  1.43*  ?CALCIUM 8.7* 8.5*  --  8.9  ? ?GFR: ?Estimated Creatinine Clearance: 35.5 mL/min (A) (by C-G formula based on SCr of 1.43 mg/dL (H)). ?Liver Function Tests: ?Recent Labs  ?Lab 12/08/21 ?1300  ?AST 21  ?ALT 20  ?ALKPHOS 85  ?BILITOT 0.5  ?PROT 7.4  ?ALBUMIN 3.6  ? ?No results for input(s): LIPASE, AMYLASE in the last 168 hours. ?No results for input(s): AMMONIA in the last 168 hours. ?Coagulation Profile: ?No results for input(s): INR, PROTIME in the last 168 hours. ?Cardiac Enzymes: ?No results for input(s): CKTOTAL, CKMB, CKMBINDEX, TROPONINI in the last 168 hours. ?BNP (last 3 results) ?No results for input(s): PROBNP in the last 8760 hours. ?HbA1C: ?No results for input(s): HGBA1C in the last 72 hours. ?CBG: ?Recent Labs  ?Lab 12/10/21 ?1141 12/10/21 ?1713 12/10/21 ?2133 12/11/21 ?1610 12/11/21 ?1126  ?GLUCAP 317* 430* 339* 197* 326*  ? ?Lipid Profile: ?No results for input(s): CHOL, HDL, LDLCALC, TRIG, CHOLHDL, LDLDIRECT in the last 72 hours. ?Thyroid Function Tests: ?No results for input(s): TSH, T4TOTAL, FREET4, T3FREE, THYROIDAB in the last 72 hours. ?Anemia Panel: ?No results for input(s): VITAMINB12, FOLATE, FERRITIN, TIBC, IRON, RETICCTPCT in the last 72 hours. ?Sepsis Labs: ?No results for input(s): PROCALCITON, LATICACIDVEN in the last 168 hours. ? ?No results found for this or any previous visit (from the past  240 hour(s)).  ? ? ? ? ? ?Radiology Studies: ?DG CHEST PORT 1 VIEW ? ?Result Date: 12/10/2021 ?CLINICAL DATA:  Altered mental status, cough EXAM: PORTABLE CHEST 1 VIEW COMPARISON:  Portable exam at 1449 hrs compared to 12/08/2021 FINDINGS: Normal heart size, mediastinal contours, and pulmonary vascularity. Lungs clear. No pulmonary infiltrate, pleural effusion, or pneumothorax. Fractures of posterior RIGHT 6th 7th 8th ribs. IMPRESSION: No acute abnormalities. Electronically Signed   By: Lavonia Dana M.D.   On: 12/10/2021 15:17   ? ? ? ? ? ?Scheduled Meds: ? atenolol  12.5 mg Oral BID  ? donepezil  10 mg Oral QHS  ? enoxaparin (LOVENOX) injection  40 mg Subcutaneous Q24H  ? insulin aspart  0-15 Units Subcutaneous TID WC  ? insulin aspart  0-5 Units Subcutaneous QHS  ? insulin aspart  8 Units Subcutaneous TID WC  ? insulin glargine-yfgn  10 Units Subcutaneous BID  ? ipratropium-albuterol  3 mL Nebulization Q6H  ? memantine  5 mg Oral BID  ? QUEtiapine  12.5 mg Oral QHS  ? ?Continuous Infusions: ? lactated ringers    ? ? ? LOS: 2 days  ? ? ?Time spent: 35 minutes ? ? ? ?Sharon, DO ?[facility] ? ?If 7PM-7AM, please contact night-coverage ?[email or web address] ?12/11/2021, 2:24 PM  ? ?

## 2021-12-11 NOTE — TOC Progression Note (Signed)
Transition of Care (TOC) - Progression Note  ? ? ?Patient Details  ?Name: [name] ?MRN: [id number] ?Date of Birth: 12-13-1928 ? ?Transition of Care (TOC) CM/SW Contact  ?Boneta Lucks, RN ?Phone Number: ?12/11/2021, 4:06 PM ? ?Clinical Narrative:   Patient will be made comfort care. Family want [employer]. Referral sent to [facility] for assistance.  ? ?Expected Discharge Plan: [place] ?Barriers to Discharge: Continued Medical Work up ? ?Expected Discharge Plan and Services ?Expected Discharge Plan: [place] ?  ? Living arrangements for the past 2 months: [place] ?                ?   ?Readmission Risk Interventions ? ?  12/10/2021  ? 10:33 AM  ?Readmission Risk Prevention Plan  ?Transportation Screening Complete  ?Home Care Screening Complete  ?Medication Review (RN CM) Complete  ? ? ?

## 2021-12-11 NOTE — Progress Notes (Signed)
Inpatient Diabetes Program Recommendations ? ?AACE/ADA: New Consensus Statement on Inpatient Glycemic Control (2015) ? ?Target Ranges:  Prepandial:   less than 140 mg/dL ?     Peak postprandial:   less than 180 mg/dL (1-2 hours) ?     Critically ill patients:  140 - 180 mg/dL  ? ?Lab Results  ?Component Value Date  ? GLUCAP 326 (H) 12/11/2021  ? HGBA1C 8.7 (H) 10/31/2021  ? ? ?Review of Glycemic Control ? ?Diabetes history: DM2 ?Outpatient Diabetes medications: Tresiba 16 units qd, Metformin 1 gm qam, 500 mg q pm, Jardiance 25 mg qd ?Current orders for Inpatient glycemic control:  Semglee 10 units bid, Novolog 6 units tid meal coverage if eats 50%, Novolog correction 0-15 units tid, 0-5 units hs ? ?Inpatient Diabetes Program Recommendations:   ?If PP CBGs remain elevated, ?-Increase Novolog meal coverage to 8 units tid if eats 50% ?Secure chat sent to Dr. Manuella Ghazi. ? ?Thank you, ?Nani Gasser Kayde Atkerson, RN, MSN, CDE  ?Diabetes Coordinator ?Inpatient Glycemic Control Team ?Team Pager [phone] (8am-5pm) ?12/11/2021 11:35 AM ? ? ? ? ?

## 2021-12-11 NOTE — Evaluation (Signed)
Modified Barium Swallow Progress Note ? ?Patient Details  ?Name: [name] ?MRN: [id number] ?Date of Birth: 1928/08/07 ? ?Today's Date: 12/11/2021 ? ?Modified Barium Swallow completed.  Full report located under Chart Review in the Imaging Section. ? ?Brief recommendations include the following: ? ?Clinical Impression ? Pt presents with severe sensorimotor oropharyngeal dysphagia that is characterized by prolonged oral prep, premature spillage, poor hyolaryngeal excursion, poor laryngeal vestibule closure, decreased airway protection and no pharyngeal stripping wave. Pt aspirated all textures and consistencies presented; thin and NTL were initially aspirated before or during the swallow and residue of all other textures and consistencies (puree, regular, pudding and HTL) were aspirated (moderate to gross amount of aspiration) after the swallow with subsequent swallows. Pt does sense the initial aspiration and demonstrates a reflexive cough, however, cough is not effective in clearing aspirates and multiple dry swallows are not effective in clearing pharyngeal residue. SLP spoke with Pt's two sons at bedside who report their father does not want a feeding tube and that they want him to be comfortable. Least restrictive diet recommendation is D1/puree and HTL with the understanding that pt is HIGH risk for aspiration and will aspirate, however, defer to hospice or comfort measures for comfort feeds if indicated by goals of care determined. ST will continue to follow for goals of care, support and education. ?  ?Swallow Evaluation Recommendations ? ?   ? ? SLP Diet Recommendations: NPO (pending goals of care discussion) ? ? Liquid Administration via: Spoon;Cup ? ? Medication Administration: Via alternative means ? ? Supervision: Full supervision/cueing for compensatory strategies ? ? Compensations: Minimize environmental distractions;Slow rate;Small sips/bites ? ? Postural Changes: Remain semi-upright after after  feeds/meals (Comment) ? ?   ? ? Other Recommendations: Order thickener from pharmacy ? ? ?[name] Clauson H. Izora Ribas MA, CCC-SLP ?Speech Language Pathologist ? ?[name] ?12/11/2021,3:57 PM ? ?

## 2021-12-11 NOTE — Progress Notes (Signed)
[facility] ([facility]) Hospital Liaison Note  Referral received for patient/family interest in [facility]. Chart under review by [facility] physician.   Hospice eligibility pending.   Please call with any questions or concerns. Thank you   Shanita Wicker, LCSW [facility] Hospital Liaison  [phone] 

## 2021-12-12 DIAGNOSIS — G9341 Metabolic encephalopathy: Secondary | ICD-10-CM | POA: Diagnosis not present

## 2021-12-12 LAB — GLUCOSE, CAPILLARY
Glucose-Capillary: 158 mg/dL — ABNORMAL HIGH (ref 70–99)
Glucose-Capillary: 343 mg/dL — ABNORMAL HIGH (ref 70–99)

## 2021-12-12 NOTE — Discharge Summary (Signed)
Physician Discharge Summary  [name] Qin [name] DXA:[id number] DOB: July 17, 1929 DOA: 12/08/2021  PCP: Susy Frizzle, MD  Admit date: 12/08/2021  Discharge date: 12/12/2021  Admitted From:Home  Disposition:  Hospice facility  Recommendations for Outpatient Follow-up:  Follow up with hospice facility  Home Health:N/A  Equipment/Devices:None  Discharge Condition:Stable  CODE STATUS: DNR  Diet recommendation: Pleasure feeds  Brief/Interim Summary: 88 year old male with history of hypertension, diabetes, admitted to the hospital with altered mental status.  Recently had a fall suffering right-sided rib fractures.  Was started on oxycodone for pain management.  Became increasingly confused, dehydrated.  No obvious signs of infection on work-up.  Admitted for persistent encephalopathy.  He was thought to have acute toxic metabolic encephalopathy due to dehydration and use of oxycodone.  He has developed delirium and is unable to swallow per evaluation here.  He has a high aspiration risk and has been recommended to be n.p.o. and family members do not want to consider tube feedings.  He does have severe dementia which appears to be progressing and family members are agreeable to hospice care at this point.  He is in stable condition for discharge and has poor prognosis in the short-term with likely days to weeks of survival.  Discharge Diagnoses:  Principal Problem:   Acute metabolic encephalopathy Active Problems:   Diabetes mellitus ([place])   Hypertension   Dementia without behavioral disturbance ([place])   Acute encephalopathy   Dysphagia  Principal discharge diagnosis: Acute toxic metabolic encephalopathy due to dehydration and narcotic use.  Advanced dementia with dysphagia.  Discharge Instructions  Discharge Instructions     Diet - low sodium heart healthy   Complete by: As directed    Increase activity slowly   Complete by: As directed       Allergies as of 12/12/2021        Reactions   Avodart [dutasteride] Swelling   Ibuprofen Nausea And Vomiting   Morphine Nausea And Vomiting        Medication List     STOP taking these medications    aspirin EC 81 MG tablet   atenolol 25 MG tablet Commonly known as: TENORMIN   B-D UF III MINI PEN NEEDLES 31G X 5 MM Misc Generic drug: Insulin Pen Needle   cephALEXin 500 MG capsule Commonly known as: KEFLEX   cholecalciferol 1000 units tablet Commonly known as: VITAMIN D   docusate sodium 100 MG capsule Commonly known as: COLACE   donepezil 10 MG tablet Commonly known as: ARICEPT   empagliflozin 25 MG Tabs tablet Commonly known as: Jardiance   ferrous gluconate 325 MG tablet Commonly known as: FERGON   insulin lispro 100 UNIT/ML injection Commonly known as: HumaLOG   memantine 10 MG tablet Commonly known as: NAMENDA   metFORMIN 500 MG tablet Commonly known as: GLUCOPHAGE   mometasone 0.1 % ointment Commonly known as: Elocon   omeprazole 20 MG capsule Commonly known as: PRILOSEC   ONE TOUCH DELICA LANCING DEV Misc   ONE TOUCH ULTRA 2 w/Device Kit   OneTouch Delica Lancets 20N Misc   OneTouch Verio test strip Generic drug: glucose blood   oxyCODONE 5 MG immediate release tablet Commonly known as: Roxicodone   repaglinide 1 MG tablet Commonly known as: PRANDIN   repaglinide 2 MG tablet Commonly known as: PRANDIN   simvastatin 40 MG tablet Commonly known as: ZOCOR   Tresiba FlexTouch 100 UNIT/ML FlexTouch Pen Generic drug: insulin degludec        Allergies  Allergen Reactions   Avodart [Dutasteride] Swelling   Ibuprofen Nausea And Vomiting   Morphine Nausea And Vomiting    Consultations: None   Procedures/Studies: CT Head Wo Contrast  Result Date: 12/08/2021 CLINICAL DATA:  Mental status changes. EXAM: CT HEAD WITHOUT CONTRAST TECHNIQUE: Contiguous axial images were obtained from the base of the skull through the vertex without intravenous contrast. RADIATION  DOSE REDUCTION: This exam was performed according to the departmental dose-optimization program which includes automated exposure control, adjustment of the mA and/or kV according to patient size and/or use of iterative reconstruction technique. COMPARISON:  12/01/2021 FINDINGS: Brain: Stable age related cerebral atrophy, ventriculomegaly and periventricular white matter disease. No extra-axial fluid collections are identified. No CT findings for acute hemispheric infarction or intracranial hemorrhage. No mass lesions. The brainstem and cerebellum are normal. Vascular: Stable vascular calcifications. No aneurysm or hyperdense vessels. Skull: No skull fracture or bone lesions. Sinuses/Orbits: The paranasal sinuses and mastoid air cells are clear. The globes are intact. Other: No scalp lesions or scalp hematoma. IMPRESSION: 1. Stable age related cerebral atrophy, ventriculomegaly and periventricular white matter disease. 2. No acute intracranial findings or mass lesions. Electronically Signed   By: Marijo Sanes M.D.   On: 12/08/2021 14:54   CT Head Wo Contrast  Result Date: 12/01/2021 CLINICAL DATA:  Trauma. EXAM: CT HEAD WITHOUT CONTRAST CT CERVICAL SPINE WITHOUT CONTRAST TECHNIQUE: Multidetector CT imaging of the head and cervical spine was performed following the standard protocol without intravenous contrast. Multiplanar CT image reconstructions of the cervical spine were also generated. RADIATION DOSE REDUCTION: This exam was performed according to the departmental dose-optimization program which includes automated exposure control, adjustment of the mA and/or kV according to patient size and/or use of iterative reconstruction technique. COMPARISON:  Head CT dated 07/16/2021. FINDINGS: CT HEAD FINDINGS Brain: Mild age-related atrophy and chronic microvascular ischemic changes. Probable small right thalamic old lacunar infarct. There is no acute intracranial hemorrhage. No mass effect or midline shift. No  extra-axial fluid collection. Vascular: No hyperdense vessel or unexpected calcification. Skull: Normal. Negative for fracture or focal lesion. Sinuses/Orbits: No acute finding. Other: None CT CERVICAL SPINE FINDINGS Alignment: No acute subluxation. Skull base and vertebrae: No acute fracture.  Osteopenia. Soft tissues and spinal canal: No prevertebral fluid or swelling. No visible canal hematoma. Disc levels:  Multilevel degenerative changes. Upper chest: Negative. Other: Left carotid bulb calcified plaque. IMPRESSION: 1. No acute intracranial pathology. Mild age-related atrophy and chronic microvascular ischemic changes. 2. No acute cervical spine fracture or subluxation. Electronically Signed   By: Anner Crete M.D.   On: 12/01/2021 20:58   CT Cervical Spine Wo Contrast  Result Date: 12/01/2021 CLINICAL DATA:  Trauma. EXAM: CT HEAD WITHOUT CONTRAST CT CERVICAL SPINE WITHOUT CONTRAST TECHNIQUE: Multidetector CT imaging of the head and cervical spine was performed following the standard protocol without intravenous contrast. Multiplanar CT image reconstructions of the cervical spine were also generated. RADIATION DOSE REDUCTION: This exam was performed according to the departmental dose-optimization program which includes automated exposure control, adjustment of the mA and/or kV according to patient size and/or use of iterative reconstruction technique. COMPARISON:  Head CT dated 07/16/2021. FINDINGS: CT HEAD FINDINGS Brain: Mild age-related atrophy and chronic microvascular ischemic changes. Probable small right thalamic old lacunar infarct. There is no acute intracranial hemorrhage. No mass effect or midline shift. No extra-axial fluid collection. Vascular: No hyperdense vessel or unexpected calcification. Skull: Normal. Negative for fracture or focal lesion. Sinuses/Orbits: No acute finding. Other: None CT CERVICAL  SPINE FINDINGS Alignment: No acute subluxation. Skull base and vertebrae: No acute  fracture.  Osteopenia. Soft tissues and spinal canal: No prevertebral fluid or swelling. No visible canal hematoma. Disc levels:  Multilevel degenerative changes. Upper chest: Negative. Other: Left carotid bulb calcified plaque. IMPRESSION: 1. No acute intracranial pathology. Mild age-related atrophy and chronic microvascular ischemic changes. 2. No acute cervical spine fracture or subluxation. Electronically Signed   By: Anner Crete M.D.   On: 12/01/2021 20:58   CT CHEST ABDOMEN PELVIS W CONTRAST  Result Date: 12/01/2021 CLINICAL DATA:  Unwitnessed fall. EXAM: CT CHEST, ABDOMEN, AND PELVIS WITH CONTRAST TECHNIQUE: Multidetector CT imaging of the chest, abdomen and pelvis was performed following the standard protocol during bolus administration of intravenous contrast. RADIATION DOSE REDUCTION: This exam was performed according to the departmental dose-optimization program which includes automated exposure control, adjustment of the mA and/or kV according to patient size and/or use of iterative reconstruction technique. CONTRAST:  77m OMNIPAQUE IOHEXOL 300 MG/ML  SOLN COMPARISON:  April 27, 2012 FINDINGS: CT CHEST FINDINGS Cardiovascular: There is moderate to marked severity calcification of the aortic arch, without evidence of aortic aneurysm. Normal heart size with moderate severity coronary artery calcification. No pericardial effusion. Mediastinum/Nodes: No enlarged mediastinal, hilar, or axillary lymph nodes. Thyroid gland, trachea, and esophagus demonstrate no significant findings. Lungs/Pleura: Mild atelectasis is seen along the posterior aspects of the bilateral lower lobes. A small pneumothorax is seen along the anterior aspect of the right lung base. This measures approximately 7.7 mm in AP measurement. No pleural effusion is identified. Musculoskeletal: Acute posterolateral eighth and ninth right rib fractures are seen. A small amount of soft tissue air is seen within the adjacent portions of  the posterolateral chest wall CT ABDOMEN PELVIS FINDINGS Hepatobiliary: No focal liver abnormality is seen. No gallstones, gallbladder wall thickening, or biliary dilatation. Pancreas: Unremarkable. No pancreatic ductal dilatation or surrounding inflammatory changes. Spleen: Normal in size without focal abnormality. Adrenals/Urinary Tract: Adrenal glands are unremarkable. Kidneys are normal in size, without renal calculi or hydronephrosis. 8 mm and 9 mm simple cysts are seen within the upper pole of the left kidney. No additional follow-up or imaging is recommended. Bladder is unremarkable. Stomach/Bowel: Stomach is within normal limits. The appendix is surgically absent. No evidence of bowel wall thickening, distention, or inflammatory changes. Vascular/Lymphatic: Aortic atherosclerosis. No enlarged abdominal or pelvic lymph nodes. Reproductive: The prostate gland is surgically absent. Other: No abdominal wall hernia or abnormality. No abdominopelvic ascites. Musculoskeletal: There is a total right hip replacement with associated streak artifact and subsequently limited evaluation of the adjacent osseous and soft tissue structures. Multilevel degenerative changes are seen throughout the lumbar spine. IMPRESSION: 1. Small anterior right basilar pneumothorax. 2. Acute posterolateral eighth and ninth right rib fractures. 3. Mild bilateral lower lobe atelectasis. 4. No acute or active process within the abdomen or pelvis. 5. Total right hip replacement. Aortic Atherosclerosis (ICD10-I70.0). Electronically Signed   By: TVirgina NorfolkM.D.   On: 12/01/2021 21:03   DG CHEST PORT 1 VIEW  Result Date: 12/10/2021 CLINICAL DATA:  Altered mental status, cough EXAM: PORTABLE CHEST 1 VIEW COMPARISON:  Portable exam at 1449 hrs compared to 12/08/2021 FINDINGS: Normal heart size, mediastinal contours, and pulmonary vascularity. Lungs clear. No pulmonary infiltrate, pleural effusion, or pneumothorax. Fractures of posterior  RIGHT 6th 7th 8th ribs. IMPRESSION: No acute abnormalities. Electronically Signed   By: MLavonia DanaM.D.   On: 12/10/2021 15:17   DG Chest Port 1 View  Result  Date: 12/08/2021 CLINICAL DATA:  Status post fall May 7 with right-sided rib fractures and now with altered mental status EXAM: PORTABLE CHEST 1 VIEW COMPARISON:  CT dated Dec 01, 2021; chest radiograph dated Dec 02, 2021 FINDINGS: The cardiomediastinal silhouette is unchanged in contour. No pleural effusion. No pneumothorax. No acute pleuroparenchymal abnormality. Visualized abdomen is unremarkable. Revisualization of RIGHT-sided rib fractures. IMPRESSION: No discrete pneumothorax.  Multiple RIGHT-sided rib fractures. Electronically Signed   By: Valentino Saxon M.D.   On: 12/08/2021 14:36   DG Chest Portable 1 View  Result Date: 12/02/2021 CLINICAL DATA:  Follow-up right pneumothorax EXAM: PORTABLE CHEST 1 VIEW COMPARISON:  CT 12/01/2021 FINDINGS: The previously seen small right basilar pneumothorax on CT cannot be visualized by plain film, possibly related to its very small size. No visible pneumothorax. Heart is normal size. No confluent airspace opacities or effusions. IMPRESSION: No visible pneumothorax by plain film. Electronically Signed   By: Rolm Baptise M.D.   On: 12/02/2021 01:14     Discharge Exam: Vitals:   12/11/21 1405 12/11/21 2129  BP:  (!) 120/107  Pulse:  (!) 54  Resp:    Temp:  98.4 F (36.9 C)  SpO2: 95% (!) 87%   Vitals:   12/11/21 0821 12/11/21 1319 12/11/21 1405 12/11/21 2129  BP:  (!) 109/51  (!) 120/107  Pulse:    (!) 54  Resp:      Temp:    98.4 F (36.9 C)  TempSrc:    Oral  SpO2: 94%  95% (!) 87%  Weight:      Height:        General: Pt is alert, awake, not in acute distress Cardiovascular: RRR, S1/S2 +, no rubs, no gallops Respiratory: CTA bilaterally, no wheezing, no rhonchi Abdominal: Soft, NT, ND, bowel sounds + Extremities: no edema, no cyanosis    The results of significant  diagnostics from this hospitalization (including imaging, microbiology, ancillary and laboratory) are listed below for reference.     Microbiology: No results found for this or any previous visit (from the past 240 hour(s)).   Labs: BNP (last 3 results) No results for input(s): BNP in the last 8760 hours. Basic Metabolic Panel: Recent Labs  Lab 12/08/21 1300 12/09/21 0445 12/10/21 1821 12/11/21 0522  NA 136 138  --  139  K 3.9 3.7  --  3.6  CL 104 107  --  108  CO2 24 24  --  22  GLUCOSE 219* 70 378* 186*  BUN 28* 18  --  34*  CREATININE 1.27* 1.05  --  1.43*  CALCIUM 8.7* 8.5*  --  8.9   Liver Function Tests: Recent Labs  Lab 12/08/21 1300  AST 21  ALT 20  ALKPHOS 85  BILITOT 0.5  PROT 7.4  ALBUMIN 3.6   No results for input(s): LIPASE, AMYLASE in the last 168 hours. No results for input(s): AMMONIA in the last 168 hours. CBC: Recent Labs  Lab 12/08/21 1300 12/09/21 0445 12/11/21 0522  WBC 8.2 8.1 11.6*  NEUTROABS 5.7  --   --   HGB 13.3 12.8* 13.3  HCT 40.2 37.2* 40.1  MCV 100.2* 96.9 97.1  PLT 174 180 214   Cardiac Enzymes: No results for input(s): CKTOTAL, CKMB, CKMBINDEX, TROPONINI in the last 168 hours. BNP: Invalid input(s): POCBNP CBG: Recent Labs  Lab 12/10/21 1713 12/10/21 2133 12/11/21 0718 12/11/21 1126 12/12/21 0749  GLUCAP 430* 339* 197* 326* 158*   D-Dimer No results for input(s): DDIMER  in the last 72 hours. Hgb A1c No results for input(s): HGBA1C in the last 72 hours. Lipid Profile No results for input(s): CHOL, HDL, LDLCALC, TRIG, CHOLHDL, LDLDIRECT in the last 72 hours. Thyroid function studies No results for input(s): TSH, T4TOTAL, T3FREE, THYROIDAB in the last 72 hours.  Invalid input(s): FREET3 Anemia work up No results for input(s): VITAMINB12, FOLATE, FERRITIN, TIBC, IRON, RETICCTPCT in the last 72 hours. Urinalysis    Component Value Date/Time   COLORURINE STRAW (A) 12/08/2021 1401   APPEARANCEUR CLEAR  12/08/2021 1401   LABSPEC 1.016 12/08/2021 1401   PHURINE 6.0 12/08/2021 1401   GLUCOSEU >=500 (A) 12/08/2021 1401   GLUCOSEU >=1000 (A) 06/09/2018 1328   HGBUR SMALL (A) 12/08/2021 1401   BILIRUBINUR NEGATIVE 12/08/2021 1401   KETONESUR NEGATIVE 12/08/2021 1401   PROTEINUR NEGATIVE 12/08/2021 1401   UROBILINOGEN 0.2 06/09/2018 1328   NITRITE NEGATIVE 12/08/2021 1401   LEUKOCYTESUR NEGATIVE 12/08/2021 1401   Sepsis Labs Invalid input(s): PROCALCITONIN,  WBC,  LACTICIDVEN Microbiology No results found for this or any previous visit (from the past 240 hour(s)).   Time coordinating discharge: 35 minutes  SIGNED:   Rodena Goldmann, DO [facility] 12/12/2021, 10:42 AM  If 7PM-7AM, please contact night-coverage [email or web address]

## 2021-12-12 NOTE — Care Management Important Message (Signed)
Important Message  Patient Details  Name: [name] MRN: [id number] Date of Birth: 03/14/1929   Medicare Important Message Given:  Yes (late entry, letter given to son in room at 12:00pm)     Tommy Medal 12/12/2021, 3:31 PM

## 2021-12-12 NOTE — Progress Notes (Signed)
[facility] ems to transport pt. Son at bed side. Pt was alert and in good conversation, stated he was "ready to go for a ride."

## 2021-12-12 NOTE — TOC Transition Note (Addendum)
Transition of Care [facility]) - CM/SW Discharge Note   Patient Details  Name: REEDER BRISBY MRN: [id number] Date of Birth: Apr 20, 1929  [facility]) CM/SW Contact:  Boneta Lucks, RN Phone Number: 12/12/2021, 10:43 AM   Clinical Narrative:   [facility] accepted and they have a bed a [facility]. Family is accepting.  RN will call report, TOC will schedule EMS.  Addendum : EMS scheduled, TOC called to update Dellis Filbert.    Final next level of care: [place] Barriers to Discharge: Barriers Resolved   Patient Goals and CMS Choice Patient states their goals for this hospitalization and ongoing recovery are:: agreeable to [facility] place CMS Medicare.gov Compare Post Acute Care list provided to:: Patient Represenative (must comment) Choice offered to / list presented to : [facility] POA / [place]  Discharge Placement       Patient to be transferred to facility by: EMS Name of family member notified: Dellis Filbert Patient and family notified of of transfer: 12/12/21  Discharge Plan and Services      Readmission Risk Interventions    12/12/2021   10:36 AM 12/10/2021   10:33 AM  Readmission Risk Prevention Plan  Transportation Screening Complete Complete  PCP or Specialist Appt within 5-7 Days Complete   Home Care Screening  Complete  Medication Review (RN CM)  Complete

## 2021-12-12 NOTE — Progress Notes (Addendum)
Manufacturing engineer [facility])  Mr. Holness is approved for [facility] and we have a bed for him today.  His family must meet with our social worker to complete necessary consents, and then transport can be arranged.  Please fax d/c summary to [phone].  RN staff, please call report at any time to [phone], room is assigned when report is called.  Once transport ETA has been confirmed, please call son [name] so he will know what time to expect his father to arrive at [facility].  Venia Carbon DNP, RN [facility] Liaison   ** all necessary consents are completed and transport can be arranged at any time.

## 2021-12-17 ENCOUNTER — Telehealth: Payer: Self-pay | Admitting: Family Medicine

## 2021-12-17 NOTE — Telephone Encounter (Signed)
Patient's son [name] called to make sure pharmacist is aware patient passed away, and to request for pharmacist to cancel all of patient's prescriptions. Please advise at [phone].

## 2021-12-17 NOTE — Telephone Encounter (Signed)
So sorry to hear this.  I will let the pharmacy know.

## 2021-12-24 DIAGNOSIS — Z20828 Contact with and (suspected) exposure to other viral communicable diseases: Secondary | ICD-10-CM | POA: Diagnosis not present

## 2021-12-26 DEATH — deceased

## 2022-01-04 DIAGNOSIS — Z20828 Contact with and (suspected) exposure to other viral communicable diseases: Secondary | ICD-10-CM | POA: Diagnosis not present

## 2022-10-15 IMAGING — CT CT HEAD W/O CM
3 series · 16 of 47 positions shown, 19 images · non-contrast
Comparison: 09/28/2018

CLINICAL DATA: Altered mental status

EXAM:
CT HEAD WITHOUT CONTRAST
TECHNIQUE: Contiguous axial images were obtained from the base of the skull
through the vertex without intravenous contrast.

[Series 2: head w o · axial · 0.44mm/px · z∈[+51,+191]mm · 10 of 34 slices shown, 13 images]
[im 3/34  brain]
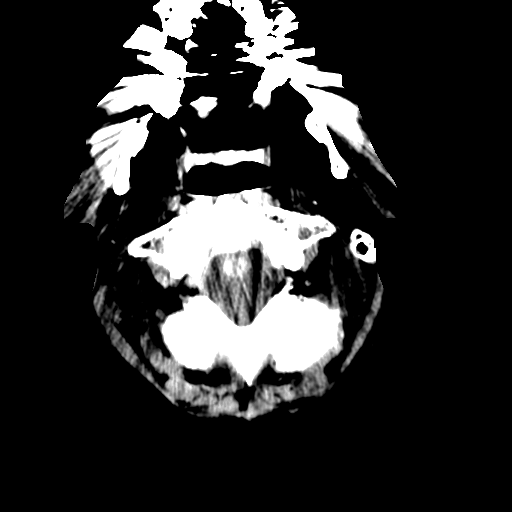
[im 3/34  bone]
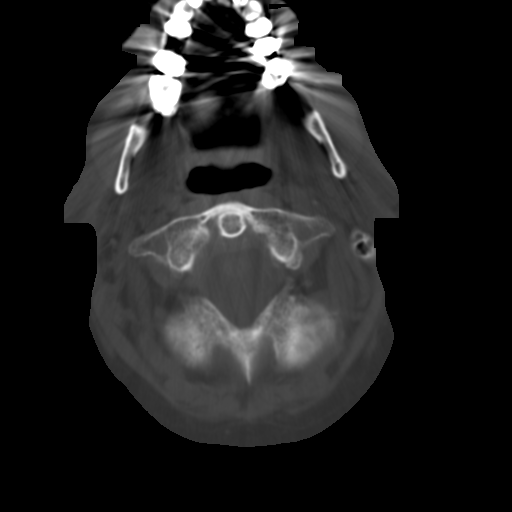
[im 6/34  brain]
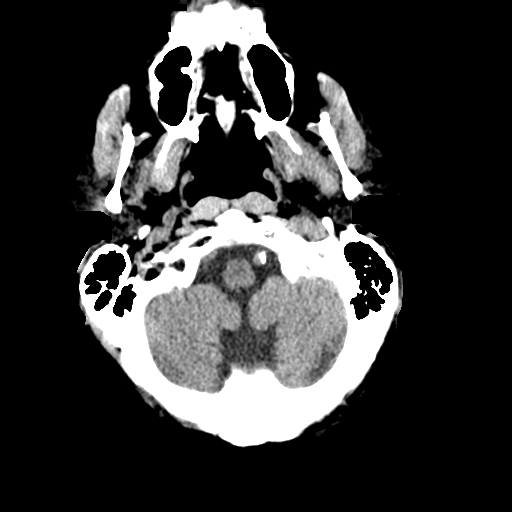
[im 10/34  brain]
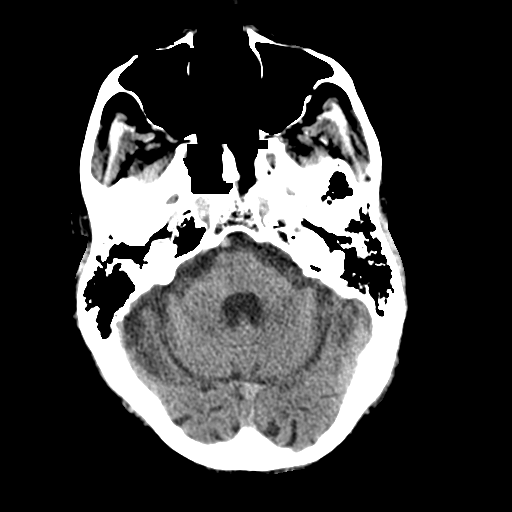
[im 12/34  brain]
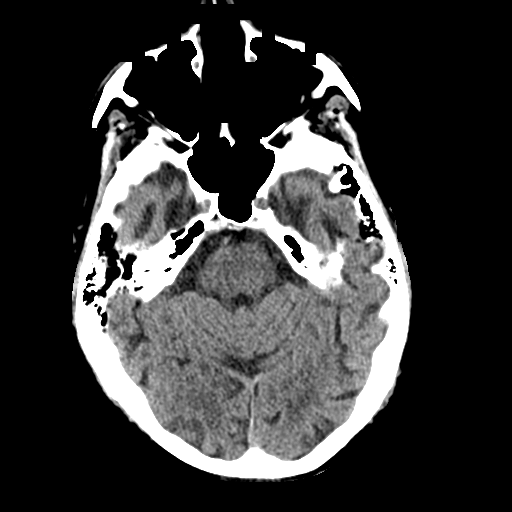
[im 15/34  brain]
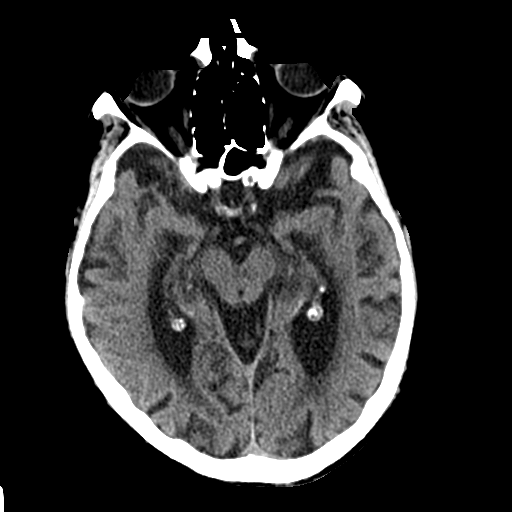
[im 15/34  bone]
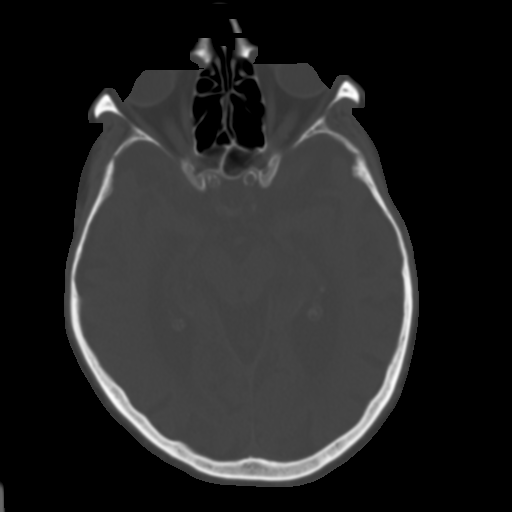
[im 19/34  brain]
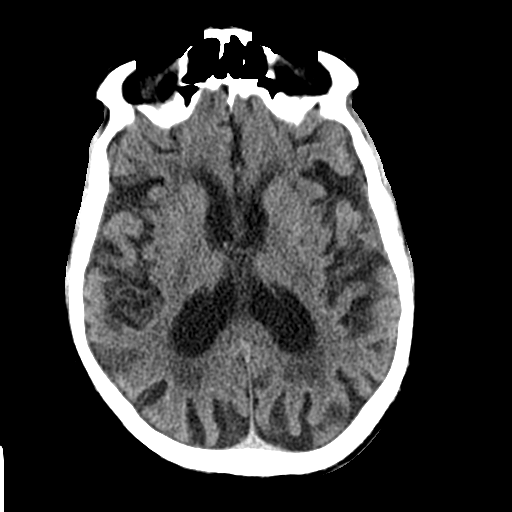
[im 22/34  brain]
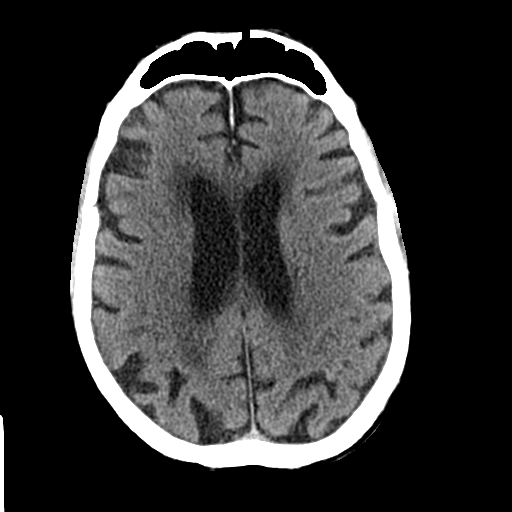
[im 26/34  brain]
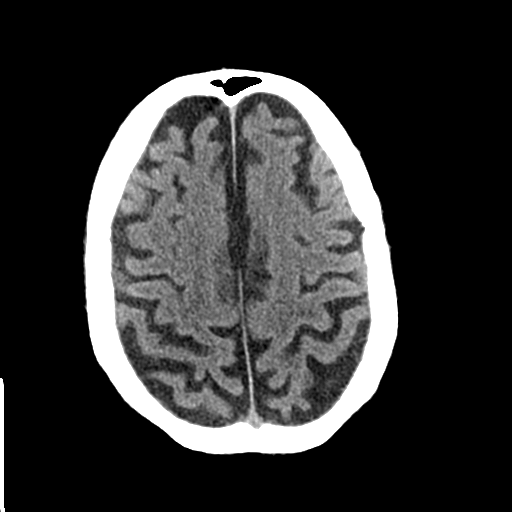
[im 28/34  brain]
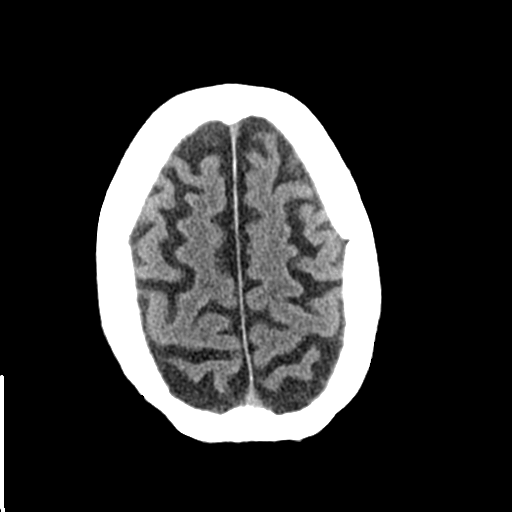
[im 28/34  bone]
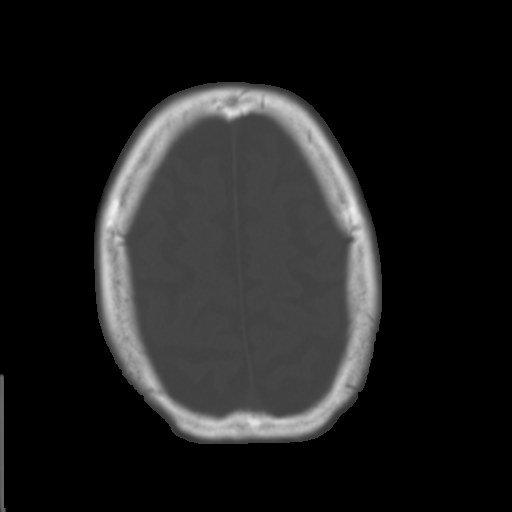
[im 31/34  brain]
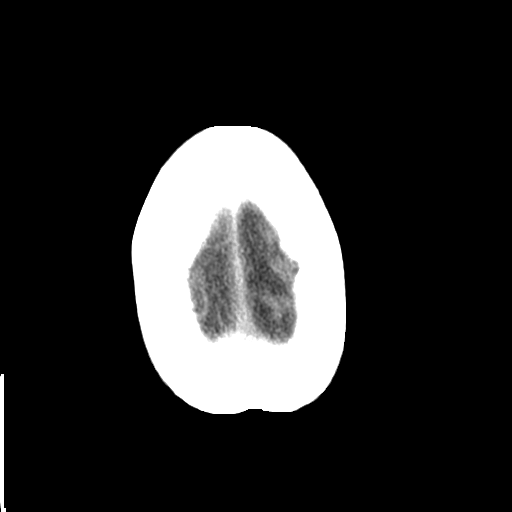

[Series 4: coronal soft · coronal · 0.36mm/px · 3 of 74 slices shown]
[im 25/74  brain]
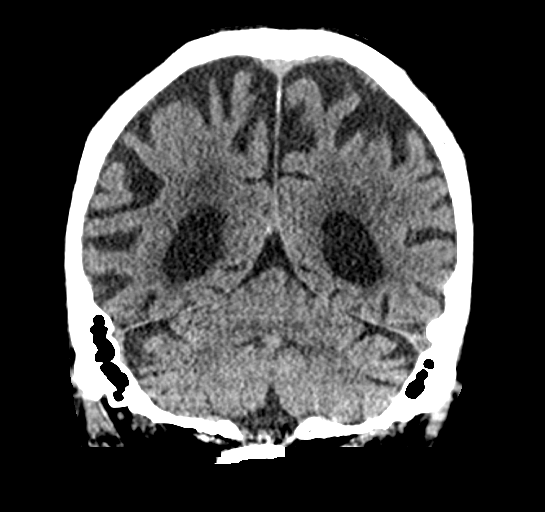
[im 33/74  brain]
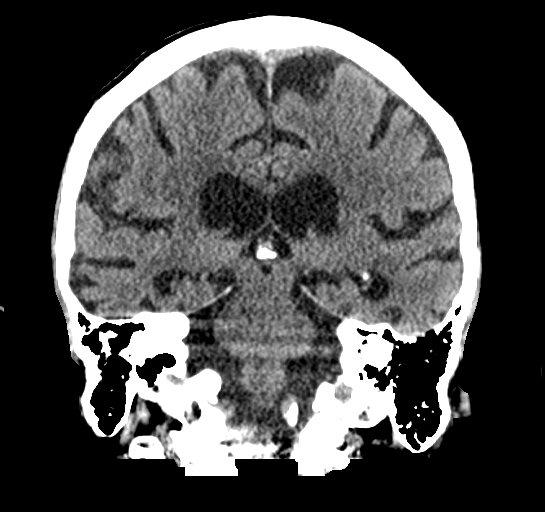
[im 41/74  brain]
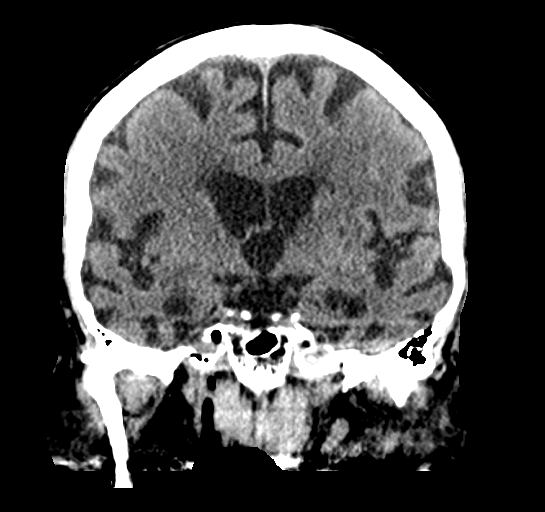

[Series 5: sagittal soft · sagittal · 0.39mm/px · 3 of 60 slices shown]
[im 20/60  brain]
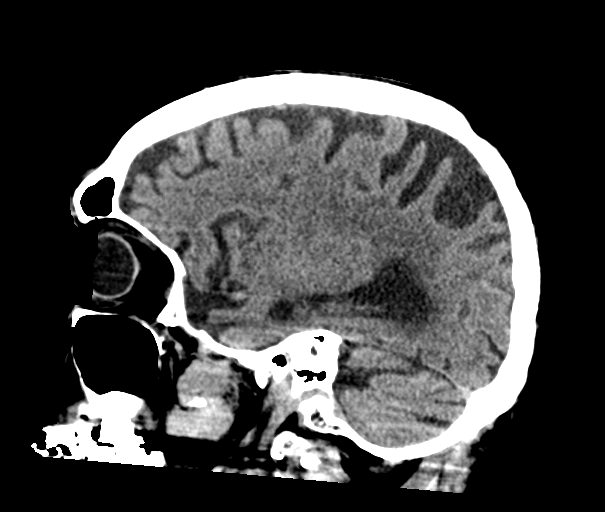
[im 30/60  brain]
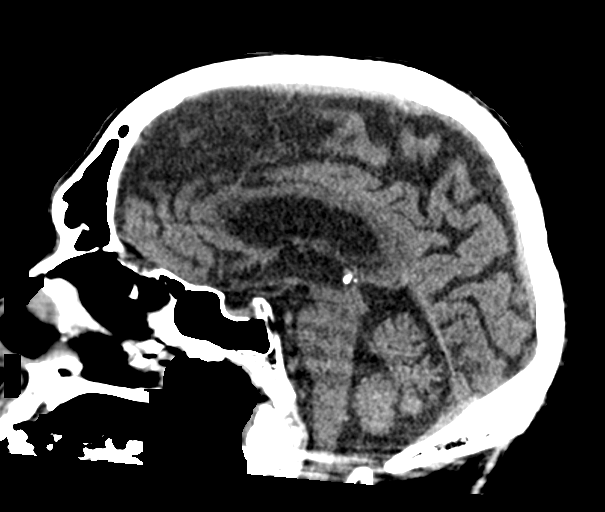
[im 40/60  brain]
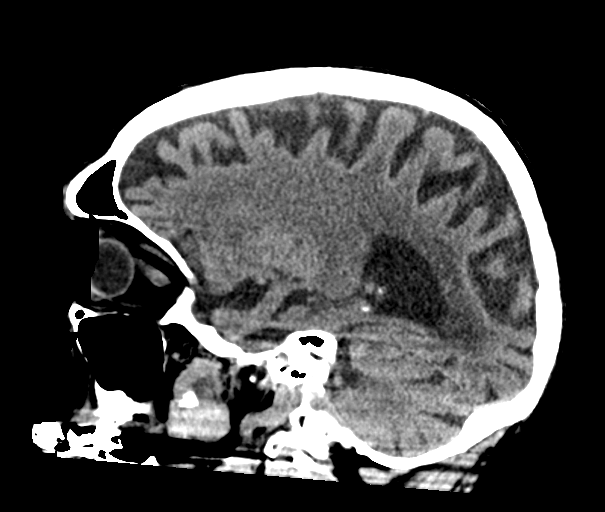

[16 of 47 positions shown; findings below may reference images not displayed]

FINDINGS: Brain: No evidence of acute infarction, hemorrhage, hydrocephalus,
extra-axial collection or mass lesion/mass effect. Chronic atrophic
and ischemic changes are noted. This is stable from the prior exam.

Vascular: No hyperdense vessel or unexpected calcification.

Skull: Normal. Negative for fracture or focal lesion.

Sinuses/Orbits: No acute finding.

Other: None.
IMPRESSION: Chronic atrophic and ischemic changes as described.
